# Patient Record
Sex: Female | Born: 1935 | Race: White | Hispanic: No | Smoking: Never smoker
Health system: Southern US, Community
[De-identification: ages and names within clinical notes are randomized; demographics above are authoritative.]

## PROBLEM LIST (undated history)

## (undated) DIAGNOSIS — I350 Nonrheumatic aortic (valve) stenosis: Secondary | ICD-10-CM

## (undated) DIAGNOSIS — C50911 Malignant neoplasm of unspecified site of right female breast: Secondary | ICD-10-CM

## (undated) DIAGNOSIS — Z8719 Personal history of other diseases of the digestive system: Secondary | ICD-10-CM

## (undated) DIAGNOSIS — R0989 Other specified symptoms and signs involving the circulatory and respiratory systems: Secondary | ICD-10-CM

## (undated) DIAGNOSIS — E7800 Pure hypercholesterolemia, unspecified: Secondary | ICD-10-CM

## (undated) DIAGNOSIS — R011 Cardiac murmur, unspecified: Secondary | ICD-10-CM

## (undated) DIAGNOSIS — E119 Type 2 diabetes mellitus without complications: Secondary | ICD-10-CM

## (undated) DIAGNOSIS — E039 Hypothyroidism, unspecified: Secondary | ICD-10-CM

## (undated) DIAGNOSIS — D649 Anemia, unspecified: Secondary | ICD-10-CM

## (undated) DIAGNOSIS — I639 Cerebral infarction, unspecified: Secondary | ICD-10-CM

## (undated) DIAGNOSIS — I679 Cerebrovascular disease, unspecified: Secondary | ICD-10-CM

## (undated) DIAGNOSIS — M1990 Unspecified osteoarthritis, unspecified site: Secondary | ICD-10-CM

## (undated) HISTORY — PX: TONSILLECTOMY: SUR1361

## (undated) HISTORY — PX: CHOLECYSTECTOMY: SHX55

## (undated) HISTORY — DX: Unspecified osteoarthritis, unspecified site: M19.90

## (undated) HISTORY — PX: DILATION AND CURETTAGE OF UTERUS: SHX78

## (undated) HISTORY — DX: Hypothyroidism, unspecified: E03.9

## (undated) HISTORY — PX: HEMIARTHROPLASTY HIP: SUR652

## (undated) HISTORY — PX: APPENDECTOMY: SHX54

## (undated) HISTORY — PX: FRACTURE SURGERY: SHX138

## (undated) HISTORY — PX: PELVIC AND PARA-AORTIC LYMPH NODE DISSECTION: SHX2190

## (undated) HISTORY — PX: TOTAL ABDOMINAL HYSTERECTOMY: SHX209

## (undated) HISTORY — PX: CATARACT EXTRACTION W/ INTRAOCULAR LENS  IMPLANT, BILATERAL: SHX1307

---

## 1998-06-07 ENCOUNTER — Other Ambulatory Visit: Admission: RE | Admit: 1998-06-07 | Discharge: 1998-06-07 | Payer: Self-pay | Admitting: Internal Medicine

## 1998-12-28 ENCOUNTER — Encounter: Payer: Self-pay | Admitting: Internal Medicine

## 1998-12-28 ENCOUNTER — Ambulatory Visit (HOSPITAL_COMMUNITY): Admission: RE | Admit: 1998-12-28 | Discharge: 1998-12-28 | Payer: Self-pay | Admitting: Internal Medicine

## 1999-10-23 ENCOUNTER — Encounter: Payer: Self-pay | Admitting: Emergency Medicine

## 1999-10-23 ENCOUNTER — Emergency Department (HOSPITAL_COMMUNITY): Admission: EM | Admit: 1999-10-23 | Discharge: 1999-10-23 | Payer: Self-pay | Admitting: Emergency Medicine

## 2000-06-26 ENCOUNTER — Encounter: Admission: RE | Admit: 2000-06-26 | Discharge: 2000-06-26 | Payer: Self-pay | Admitting: Internal Medicine

## 2000-06-26 ENCOUNTER — Encounter: Payer: Self-pay | Admitting: Internal Medicine

## 2000-07-25 ENCOUNTER — Encounter (INDEPENDENT_AMBULATORY_CARE_PROVIDER_SITE_OTHER): Payer: Self-pay

## 2000-07-25 ENCOUNTER — Encounter: Payer: Self-pay | Admitting: Internal Medicine

## 2000-07-25 ENCOUNTER — Ambulatory Visit (HOSPITAL_COMMUNITY): Admission: RE | Admit: 2000-07-25 | Discharge: 2000-07-25 | Payer: Self-pay | Admitting: Internal Medicine

## 2000-09-04 ENCOUNTER — Encounter: Payer: Self-pay | Admitting: Internal Medicine

## 2000-09-04 ENCOUNTER — Encounter: Admission: RE | Admit: 2000-09-04 | Discharge: 2000-09-04 | Payer: Self-pay | Admitting: Internal Medicine

## 2001-03-04 ENCOUNTER — Encounter: Admission: RE | Admit: 2001-03-04 | Discharge: 2001-03-04 | Payer: Self-pay | Admitting: Internal Medicine

## 2001-03-04 ENCOUNTER — Encounter: Payer: Self-pay | Admitting: Internal Medicine

## 2001-09-09 ENCOUNTER — Encounter: Payer: Self-pay | Admitting: Internal Medicine

## 2001-09-09 ENCOUNTER — Encounter: Admission: RE | Admit: 2001-09-09 | Discharge: 2001-09-09 | Payer: Self-pay | Admitting: Internal Medicine

## 2003-07-31 ENCOUNTER — Emergency Department (HOSPITAL_COMMUNITY): Admission: EM | Admit: 2003-07-31 | Discharge: 2003-07-31 | Payer: Self-pay | Admitting: Emergency Medicine

## 2003-10-19 ENCOUNTER — Inpatient Hospital Stay (HOSPITAL_COMMUNITY): Admission: EM | Admit: 2003-10-19 | Discharge: 2003-10-22 | Payer: Self-pay | Admitting: Neurology

## 2003-10-19 ENCOUNTER — Emergency Department (HOSPITAL_COMMUNITY): Admission: EM | Admit: 2003-10-19 | Discharge: 2003-10-19 | Payer: Self-pay | Admitting: Dentistry

## 2003-10-20 ENCOUNTER — Encounter: Payer: Self-pay | Admitting: Cardiology

## 2003-11-15 ENCOUNTER — Ambulatory Visit (HOSPITAL_COMMUNITY): Admission: RE | Admit: 2003-11-15 | Discharge: 2003-11-15 | Payer: Self-pay | Admitting: Cardiology

## 2004-04-06 ENCOUNTER — Emergency Department (HOSPITAL_COMMUNITY): Admission: EM | Admit: 2004-04-06 | Discharge: 2004-04-07 | Payer: Self-pay | Admitting: Emergency Medicine

## 2006-08-19 ENCOUNTER — Encounter: Admission: RE | Admit: 2006-08-19 | Discharge: 2006-08-19 | Payer: Self-pay | Admitting: Internal Medicine

## 2007-06-12 ENCOUNTER — Encounter: Admission: RE | Admit: 2007-06-12 | Discharge: 2007-06-12 | Payer: Self-pay | Admitting: Internal Medicine

## 2007-09-02 ENCOUNTER — Encounter: Admission: RE | Admit: 2007-09-02 | Discharge: 2007-09-02 | Payer: Self-pay | Admitting: Internal Medicine

## 2007-10-16 ENCOUNTER — Emergency Department (HOSPITAL_COMMUNITY): Admission: EM | Admit: 2007-10-16 | Discharge: 2007-10-16 | Payer: Self-pay | Admitting: Emergency Medicine

## 2007-10-22 ENCOUNTER — Encounter: Admission: RE | Admit: 2007-10-22 | Discharge: 2007-10-22 | Payer: Self-pay | Admitting: Internal Medicine

## 2007-10-22 ENCOUNTER — Other Ambulatory Visit: Admission: RE | Admit: 2007-10-22 | Discharge: 2007-10-22 | Payer: Self-pay | Admitting: Diagnostic Radiology

## 2007-10-22 ENCOUNTER — Encounter (INDEPENDENT_AMBULATORY_CARE_PROVIDER_SITE_OTHER): Payer: Self-pay | Admitting: Diagnostic Radiology

## 2007-11-13 HISTORY — PX: BREAST LUMPECTOMY: SHX2

## 2007-11-13 HISTORY — PX: BREAST BIOPSY: SHX20

## 2007-12-24 ENCOUNTER — Encounter: Admission: RE | Admit: 2007-12-24 | Discharge: 2007-12-24 | Payer: Self-pay | Admitting: Internal Medicine

## 2008-01-05 ENCOUNTER — Encounter: Admission: RE | Admit: 2008-01-05 | Discharge: 2008-01-05 | Payer: Self-pay | Admitting: Internal Medicine

## 2008-01-05 ENCOUNTER — Encounter (INDEPENDENT_AMBULATORY_CARE_PROVIDER_SITE_OTHER): Payer: Self-pay | Admitting: Diagnostic Radiology

## 2008-01-20 ENCOUNTER — Ambulatory Visit: Payer: Self-pay | Admitting: Oncology

## 2008-01-28 HISTORY — PX: CARDIOVASCULAR STRESS TEST: SHX262

## 2008-02-02 ENCOUNTER — Ambulatory Visit: Admission: RE | Admit: 2008-02-02 | Discharge: 2008-05-02 | Payer: Self-pay | Admitting: Radiation Oncology

## 2008-03-05 ENCOUNTER — Encounter (INDEPENDENT_AMBULATORY_CARE_PROVIDER_SITE_OTHER): Payer: Self-pay | Admitting: Surgery

## 2008-03-05 ENCOUNTER — Ambulatory Visit (HOSPITAL_COMMUNITY): Admission: RE | Admit: 2008-03-05 | Discharge: 2008-03-05 | Payer: Self-pay | Admitting: Surgery

## 2008-03-05 ENCOUNTER — Encounter: Admission: RE | Admit: 2008-03-05 | Discharge: 2008-03-05 | Payer: Self-pay | Admitting: Surgery

## 2008-03-18 ENCOUNTER — Ambulatory Visit: Payer: Self-pay | Admitting: Oncology

## 2008-04-08 ENCOUNTER — Ambulatory Visit: Payer: Self-pay | Admitting: Vascular Surgery

## 2008-04-08 ENCOUNTER — Ambulatory Visit: Admission: RE | Admit: 2008-04-08 | Discharge: 2008-04-08 | Payer: Self-pay | Admitting: Oncology

## 2008-04-08 ENCOUNTER — Encounter: Payer: Self-pay | Admitting: Oncology

## 2008-05-03 ENCOUNTER — Ambulatory Visit: Admission: RE | Admit: 2008-05-03 | Discharge: 2008-07-04 | Payer: Self-pay | Admitting: Radiation Oncology

## 2008-06-10 ENCOUNTER — Ambulatory Visit: Payer: Self-pay | Admitting: Oncology

## 2009-01-17 ENCOUNTER — Encounter: Admission: RE | Admit: 2009-01-17 | Discharge: 2009-01-17 | Payer: Self-pay | Admitting: Surgery

## 2009-03-30 HISTORY — PX: US ECHOCARDIOGRAPHY: HXRAD669

## 2009-04-04 ENCOUNTER — Encounter: Admission: RE | Admit: 2009-04-04 | Discharge: 2009-04-04 | Payer: Self-pay | Admitting: Cardiology

## 2009-07-12 ENCOUNTER — Inpatient Hospital Stay (HOSPITAL_COMMUNITY): Admission: EM | Admit: 2009-07-12 | Discharge: 2009-07-16 | Payer: Self-pay | Admitting: Emergency Medicine

## 2010-01-31 ENCOUNTER — Encounter: Admission: RE | Admit: 2010-01-31 | Discharge: 2010-01-31 | Payer: Self-pay | Admitting: *Deleted

## 2010-02-13 ENCOUNTER — Encounter: Admission: RE | Admit: 2010-02-13 | Discharge: 2010-02-13 | Payer: Self-pay | Admitting: Internal Medicine

## 2010-09-29 ENCOUNTER — Emergency Department (HOSPITAL_COMMUNITY): Admission: EM | Admit: 2010-09-29 | Discharge: 2010-09-29 | Payer: Self-pay | Admitting: Emergency Medicine

## 2010-10-02 ENCOUNTER — Ambulatory Visit: Payer: Self-pay | Admitting: Surgery

## 2010-10-04 ENCOUNTER — Inpatient Hospital Stay (HOSPITAL_COMMUNITY)
Admission: EM | Admit: 2010-10-04 | Discharge: 2010-10-12 | Payer: Self-pay | Source: Home / Self Care | Admitting: Emergency Medicine

## 2010-10-04 ENCOUNTER — Encounter (INDEPENDENT_AMBULATORY_CARE_PROVIDER_SITE_OTHER): Payer: Self-pay | Admitting: Emergency Medicine

## 2010-10-07 ENCOUNTER — Encounter (INDEPENDENT_AMBULATORY_CARE_PROVIDER_SITE_OTHER): Payer: Self-pay | Admitting: Internal Medicine

## 2010-10-24 ENCOUNTER — Ambulatory Visit
Admission: RE | Admit: 2010-10-24 | Discharge: 2010-11-20 | Payer: Self-pay | Source: Home / Self Care | Attending: Radiation Oncology | Admitting: Radiation Oncology

## 2010-10-27 ENCOUNTER — Ambulatory Visit: Payer: Self-pay | Admitting: Cardiology

## 2010-11-03 ENCOUNTER — Ambulatory Visit: Payer: Self-pay | Admitting: Oncology

## 2010-11-08 LAB — CBC WITH DIFFERENTIAL/PLATELET
Basophils Absolute: 0.2 10*3/uL — ABNORMAL HIGH (ref 0.0–0.1)
HCT: 36.7 % (ref 34.8–46.6)
HGB: 12.3 g/dL (ref 11.6–15.9)
LYMPH%: 21.6 % (ref 14.0–49.7)
MCH: 29.5 pg (ref 25.1–34.0)
MONO%: 3.8 % (ref 0.0–14.0)
NEUT#: 10.6 10*3/uL — ABNORMAL HIGH (ref 1.5–6.5)
NEUT%: 72.2 % (ref 38.4–76.8)
Platelets: 328 10*3/uL (ref 145–400)
RDW: 14.4 % (ref 11.2–14.5)
lymph#: 3.2 10*3/uL (ref 0.9–3.3)

## 2010-11-09 LAB — COMPREHENSIVE METABOLIC PANEL
AST: 12 U/L (ref 0–37)
Albumin: 3.6 g/dL (ref 3.5–5.2)
Alkaline Phosphatase: 116 U/L (ref 39–117)
BUN: 14 mg/dL (ref 6–23)
CO2: 20 mEq/L (ref 19–32)
Creatinine, Ser: 0.75 mg/dL (ref 0.40–1.20)
Glucose, Bld: 167 mg/dL — ABNORMAL HIGH (ref 70–99)
Potassium: 3.7 mEq/L (ref 3.5–5.3)
Sodium: 140 mEq/L (ref 135–145)
Total Bilirubin: 0.2 mg/dL — ABNORMAL LOW (ref 0.3–1.2)
Total Protein: 6.3 g/dL (ref 6.0–8.3)

## 2010-11-09 LAB — CANCER ANTIGEN 27.29: CA 27.29: 65 U/mL — ABNORMAL HIGH (ref 0–39)

## 2010-12-02 ENCOUNTER — Encounter: Payer: Self-pay | Admitting: Internal Medicine

## 2010-12-03 ENCOUNTER — Encounter: Payer: Self-pay | Admitting: Internal Medicine

## 2010-12-21 ENCOUNTER — Encounter (HOSPITAL_BASED_OUTPATIENT_CLINIC_OR_DEPARTMENT_OTHER): Payer: MEDICARE | Admitting: Oncology

## 2010-12-21 ENCOUNTER — Ambulatory Visit: Payer: MEDICARE | Attending: Radiation Oncology | Admitting: Radiation Oncology

## 2010-12-21 DIAGNOSIS — C50419 Malignant neoplasm of upper-outer quadrant of unspecified female breast: Secondary | ICD-10-CM

## 2010-12-21 DIAGNOSIS — C7952 Secondary malignant neoplasm of bone marrow: Secondary | ICD-10-CM

## 2010-12-21 DIAGNOSIS — Z170 Estrogen receptor positive status [ER+]: Secondary | ICD-10-CM

## 2010-12-21 DIAGNOSIS — C7951 Secondary malignant neoplasm of bone: Secondary | ICD-10-CM

## 2011-01-19 ENCOUNTER — Other Ambulatory Visit: Payer: Self-pay | Admitting: Internal Medicine

## 2011-01-19 DIAGNOSIS — Z1231 Encounter for screening mammogram for malignant neoplasm of breast: Secondary | ICD-10-CM

## 2011-01-19 DIAGNOSIS — Z9889 Other specified postprocedural states: Secondary | ICD-10-CM

## 2011-01-23 LAB — COMPREHENSIVE METABOLIC PANEL
AST: 18 U/L (ref 0–37)
Albumin: 2.2 g/dL — ABNORMAL LOW (ref 3.5–5.2)
BUN: 9 mg/dL (ref 6–23)
Calcium: 8.4 mg/dL (ref 8.4–10.5)
Creatinine, Ser: 0.67 mg/dL (ref 0.4–1.2)
GFR calc Af Amer: >60 mL/min (ref >60)
GFR calc non Af Amer: >60 mL/min (ref >60)
Potassium: 3.7 mEq/L (ref 3.5–5.1)
Total Protein: 5.4 g/dL — ABNORMAL LOW (ref 6.0–8.3)

## 2011-01-23 LAB — GLUCOSE, CAPILLARY
Glucose-Capillary: 124 mg/dL — ABNORMAL HIGH (ref 70–99)
Glucose-Capillary: 72 mg/dL (ref 70–99)

## 2011-01-23 LAB — CBC
HCT: 29.6 % — ABNORMAL LOW (ref 36.0–46.0)
Hemoglobin: 9.8 g/dL — ABNORMAL LOW (ref 12.0–15.0)
MCV: 87.1 fL (ref 78.0–100.0)

## 2011-01-23 LAB — LACTATE DEHYDROGENASE: LDH: 181 U/L (ref 94–250)

## 2011-01-23 LAB — CEA: CEA: 6.8 ng/mL — ABNORMAL HIGH (ref 0.0–5.0)

## 2011-01-24 LAB — GLUCOSE, CAPILLARY
Glucose-Capillary: 102 mg/dL — ABNORMAL HIGH (ref 70–99)
Glucose-Capillary: 118 mg/dL — ABNORMAL HIGH (ref 70–99)
Glucose-Capillary: 123 mg/dL — ABNORMAL HIGH (ref 70–99)
Glucose-Capillary: 132 mg/dL — ABNORMAL HIGH (ref 70–99)
Glucose-Capillary: 145 mg/dL — ABNORMAL HIGH (ref 70–99)
Glucose-Capillary: 145 mg/dL — ABNORMAL HIGH (ref 70–99)
Glucose-Capillary: 156 mg/dL — ABNORMAL HIGH (ref 70–99)
Glucose-Capillary: 158 mg/dL — ABNORMAL HIGH (ref 70–99)
Glucose-Capillary: 162 mg/dL — ABNORMAL HIGH (ref 70–99)
Glucose-Capillary: 162 mg/dL — ABNORMAL HIGH (ref 70–99)
Glucose-Capillary: 163 mg/dL — ABNORMAL HIGH (ref 70–99)
Glucose-Capillary: 165 mg/dL — ABNORMAL HIGH (ref 70–99)
Glucose-Capillary: 183 mg/dL — ABNORMAL HIGH (ref 70–99)
Glucose-Capillary: 185 mg/dL — ABNORMAL HIGH (ref 70–99)
Glucose-Capillary: 188 mg/dL — ABNORMAL HIGH (ref 70–99)
Glucose-Capillary: 194 mg/dL — ABNORMAL HIGH (ref 70–99)
Glucose-Capillary: 201 mg/dL — ABNORMAL HIGH (ref 70–99)

## 2011-01-24 LAB — CBC
HCT: 37.1 % (ref 36.0–46.0)
HCT: 38.3 % (ref 36.0–46.0)
Hemoglobin: 10.9 g/dL — ABNORMAL LOW (ref 12.0–15.0)
Hemoglobin: 12.6 g/dL (ref 12.0–15.0)
Hemoglobin: 14.3 g/dL (ref 12.0–15.0)
MCH: 29.5 pg (ref 26.0–34.0)
MCH: 29.5 pg (ref 26.0–34.0)
MCH: 29.6 pg (ref 26.0–34.0)
MCH: 30 pg (ref 26.0–34.0)
MCHC: 33.3 g/dL (ref 30.0–36.0)
MCHC: 34 g/dL (ref 30.0–36.0)
MCHC: 34.1 g/dL (ref 30.0–36.0)
MCV: 88.2 fL (ref 78.0–100.0)
Platelets: 248 10*3/uL (ref 150–400)
Platelets: 290 10*3/uL (ref 150–400)
Platelets: 349 10*3/uL (ref 150–400)
Platelets: 362 10*3/uL (ref 150–400)
RBC: 3.67 MIL/uL — ABNORMAL LOW (ref 3.87–5.11)
RBC: 4.12 MIL/uL (ref 3.87–5.11)
RBC: 4.24 MIL/uL (ref 3.87–5.11)
RBC: 4.84 MIL/uL (ref 3.87–5.11)
RDW: 14.4 % (ref 11.5–15.5)
RDW: 14.6 % (ref 11.5–15.5)
WBC: 17 10*3/uL — ABNORMAL HIGH (ref 4.0–10.5)
WBC: 19.3 10*3/uL — ABNORMAL HIGH (ref 4.0–10.5)
WBC: 21.2 10*3/uL — ABNORMAL HIGH (ref 4.0–10.5)

## 2011-01-24 LAB — BASIC METABOLIC PANEL
BUN: 13 mg/dL (ref 6–23)
BUN: 15 mg/dL (ref 6–23)
BUN: 9 mg/dL (ref 6–23)
CO2: 23 mEq/L (ref 19–32)
CO2: 24 mEq/L (ref 19–32)
Calcium: 8.5 mg/dL (ref 8.4–10.5)
Calcium: 8.7 mg/dL (ref 8.4–10.5)
Calcium: 9.1 mg/dL (ref 8.4–10.5)
Creatinine, Ser: 0.66 mg/dL (ref 0.4–1.2)
Creatinine, Ser: 0.7 mg/dL (ref 0.4–1.2)
GFR calc Af Amer: >60 mL/min (ref >60)
GFR calc Af Amer: >60 mL/min (ref >60)
GFR calc non Af Amer: >60 mL/min (ref >60)
GFR calc non Af Amer: >60 mL/min (ref >60)
GFR calc non Af Amer: >60 mL/min (ref >60)
Glucose, Bld: 149 mg/dL — ABNORMAL HIGH (ref 70–99)
Glucose, Bld: 155 mg/dL — ABNORMAL HIGH (ref 70–99)
Potassium: 3.8 mEq/L (ref 3.5–5.1)
Sodium: 136 mEq/L (ref 135–145)
Sodium: 137 mEq/L (ref 135–145)

## 2011-01-24 LAB — DIFFERENTIAL
Basophils Absolute: 0.1 10*3/uL (ref 0.0–0.1)
Basophils Absolute: 0.4 10*3/uL — ABNORMAL HIGH (ref 0.0–0.1)
Basophils Relative: 1 % (ref 0–1)
Basophils Relative: 2 % — ABNORMAL HIGH (ref 0–1)
Eosinophils Absolute: 0.1 10*3/uL (ref 0.0–0.7)
Eosinophils Relative: 0 % (ref 0–5)
Lymphocytes Relative: 18 % (ref 12–46)
Lymphocytes Relative: 24 % (ref 12–46)
Monocytes Absolute: 0.7 10*3/uL (ref 0.1–1.0)
Monocytes Absolute: 0.9 10*3/uL (ref 0.1–1.0)
Neutro Abs: 13.4 10*3/uL — ABNORMAL HIGH (ref 1.7–7.7)
Neutrophils Relative %: 70 % (ref 43–77)

## 2011-01-24 LAB — COMPREHENSIVE METABOLIC PANEL
AST: 17 U/L (ref 0–37)
AST: 22 U/L (ref 0–37)
Albumin: 2.8 g/dL — ABNORMAL LOW (ref 3.5–5.2)
Albumin: 3.4 g/dL — ABNORMAL LOW (ref 3.5–5.2)
Alkaline Phosphatase: 131 U/L — ABNORMAL HIGH (ref 39–117)
BUN: 13 mg/dL (ref 6–23)
CO2: 23 mEq/L (ref 19–32)
Calcium: 9.1 mg/dL (ref 8.4–10.5)
Chloride: 102 mEq/L (ref 96–112)
Chloride: 102 mEq/L (ref 96–112)
Creatinine, Ser: 0.83 mg/dL (ref 0.4–1.2)
Creatinine, Ser: 0.87 mg/dL (ref 0.4–1.2)
GFR calc Af Amer: >60 mL/min (ref >60)
GFR calc Af Amer: >60 mL/min (ref >60)
GFR calc non Af Amer: >60 mL/min (ref >60)
Potassium: 3.9 mEq/L (ref 3.5–5.1)
Sodium: 138 mEq/L (ref 135–145)
Total Bilirubin: 0.5 mg/dL (ref 0.3–1.2)

## 2011-01-24 LAB — URINALYSIS, ROUTINE W REFLEX MICROSCOPIC
Glucose, UA: NEGATIVE mg/dL
Hgb urine dipstick: NEGATIVE
Ketones, ur: NEGATIVE mg/dL
Ketones, ur: NEGATIVE mg/dL
Protein, ur: NEGATIVE mg/dL
Protein, ur: NEGATIVE mg/dL
Urobilinogen, UA: 0.2 mg/dL (ref 0.0–1.0)
pH: 6 (ref 5.0–8.0)

## 2011-01-24 LAB — URINE CULTURE
Culture  Setup Time: 201111290127
Special Requests: NEGATIVE

## 2011-01-24 LAB — URINE MICROSCOPIC-ADD ON

## 2011-01-24 LAB — PROTIME-INR: INR: 1.07 (ref 0.00–1.49)

## 2011-01-24 LAB — HEMOGLOBIN A1C: Hgb A1c MFr Bld: 7.2 % — ABNORMAL HIGH (ref <5.7)

## 2011-02-05 ENCOUNTER — Ambulatory Visit
Admission: RE | Admit: 2011-02-05 | Discharge: 2011-02-05 | Disposition: A | Discharge status: Home / Self Care | Payer: MEDICARE | Source: Ambulatory Visit | Attending: Internal Medicine | Admitting: Internal Medicine

## 2011-02-05 DIAGNOSIS — Z9889 Other specified postprocedural states: Secondary | ICD-10-CM

## 2011-02-09 ENCOUNTER — Other Ambulatory Visit: Payer: Self-pay | Admitting: Oncology

## 2011-02-09 ENCOUNTER — Encounter (HOSPITAL_BASED_OUTPATIENT_CLINIC_OR_DEPARTMENT_OTHER): Payer: MEDICARE | Admitting: Oncology

## 2011-02-09 ENCOUNTER — Encounter: Payer: MEDICARE | Admitting: Oncology

## 2011-02-09 DIAGNOSIS — C50419 Malignant neoplasm of upper-outer quadrant of unspecified female breast: Secondary | ICD-10-CM

## 2011-02-09 DIAGNOSIS — C7951 Secondary malignant neoplasm of bone: Secondary | ICD-10-CM

## 2011-02-09 DIAGNOSIS — Z170 Estrogen receptor positive status [ER+]: Secondary | ICD-10-CM

## 2011-02-09 LAB — CBC WITH DIFFERENTIAL/PLATELET
Basophils Absolute: 0.2 10*3/uL — ABNORMAL HIGH (ref 0.0–0.1)
Eosinophils Absolute: 0.2 10*3/uL (ref 0.0–0.5)
HCT: 35.2 % (ref 34.8–46.6)
HGB: 11.8 g/dL (ref 11.6–15.9)
LYMPH%: 22.1 % (ref 14.0–49.7)
MCV: 83.2 fL (ref 79.5–101.0)
MONO#: 0.6 10*3/uL (ref 0.1–0.9)
MONO%: 4.4 % (ref 0.0–14.0)
NEUT#: 9.8 10*3/uL — ABNORMAL HIGH (ref 1.5–6.5)
NEUT%: 71 % (ref 38.4–76.8)
Platelets: 263 10*3/uL (ref 145–400)
WBC: 13.8 10*3/uL — ABNORMAL HIGH (ref 3.9–10.3)

## 2011-02-09 LAB — COMPREHENSIVE METABOLIC PANEL
Alkaline Phosphatase: 92 U/L (ref 39–117)
BUN: 13 mg/dL (ref 6–23)
CO2: 24 mEq/L (ref 19–32)
Glucose, Bld: 138 mg/dL — ABNORMAL HIGH (ref 70–99)
Sodium: 138 mEq/L (ref 135–145)
Total Bilirubin: 0.5 mg/dL (ref 0.3–1.2)
Total Protein: 7 g/dL (ref 6.0–8.3)

## 2011-02-09 LAB — CANCER ANTIGEN 27.29: CA 27.29: 23 U/mL (ref 0–39)

## 2011-02-16 LAB — GLUCOSE, CAPILLARY
Glucose-Capillary: 111 mg/dL — ABNORMAL HIGH (ref 70–99)
Glucose-Capillary: 122 mg/dL — ABNORMAL HIGH (ref 70–99)
Glucose-Capillary: 125 mg/dL — ABNORMAL HIGH (ref 70–99)
Glucose-Capillary: 128 mg/dL — ABNORMAL HIGH (ref 70–99)
Glucose-Capillary: 128 mg/dL — ABNORMAL HIGH (ref 70–99)
Glucose-Capillary: 132 mg/dL — ABNORMAL HIGH (ref 70–99)
Glucose-Capillary: 134 mg/dL — ABNORMAL HIGH (ref 70–99)
Glucose-Capillary: 134 mg/dL — ABNORMAL HIGH (ref 70–99)
Glucose-Capillary: 139 mg/dL — ABNORMAL HIGH (ref 70–99)
Glucose-Capillary: 140 mg/dL — ABNORMAL HIGH (ref 70–99)
Glucose-Capillary: 140 mg/dL — ABNORMAL HIGH (ref 70–99)
Glucose-Capillary: 156 mg/dL — ABNORMAL HIGH (ref 70–99)
Glucose-Capillary: 166 mg/dL — ABNORMAL HIGH (ref 70–99)

## 2011-02-16 LAB — CBC
HCT: 31.4 % — ABNORMAL LOW (ref 36.0–46.0)
Hemoglobin: 10.8 g/dL — ABNORMAL LOW (ref 12.0–15.0)
Hemoglobin: 11.3 g/dL — ABNORMAL LOW (ref 12.0–15.0)
MCHC: 34.1 g/dL (ref 30.0–36.0)
MCHC: 35.3 g/dL (ref 30.0–36.0)
MCHC: 35.3 g/dL (ref 30.0–36.0)
MCV: 98.4 fL (ref 78.0–100.0)
MCV: 98.7 fL (ref 78.0–100.0)
Platelets: 234 10*3/uL (ref 150–400)
Platelets: 235 10*3/uL (ref 150–400)
RBC: 2.29 MIL/uL — ABNORMAL LOW (ref 3.87–5.11)
RBC: 3.15 MIL/uL — ABNORMAL LOW (ref 3.87–5.11)
RBC: 3.48 MIL/uL — ABNORMAL LOW (ref 3.87–5.11)
RDW: 13.7 % (ref 11.5–15.5)
WBC: 14.5 10*3/uL — ABNORMAL HIGH (ref 4.0–10.5)
WBC: 18.6 10*3/uL — ABNORMAL HIGH (ref 4.0–10.5)

## 2011-02-16 LAB — RENAL FUNCTION PANEL
BUN: 11 mg/dL (ref 6–23)
Calcium: 8.2 mg/dL — ABNORMAL LOW (ref 8.4–10.5)
Glucose, Bld: 147 mg/dL — ABNORMAL HIGH (ref 70–99)
Phosphorus: 2.4 mg/dL (ref 2.3–4.6)

## 2011-02-16 LAB — BASIC METABOLIC PANEL
BUN: 10 mg/dL (ref 6–23)
BUN: 21 mg/dL (ref 6–23)
Calcium: 7.8 mg/dL — ABNORMAL LOW (ref 8.4–10.5)
Calcium: 8.1 mg/dL — ABNORMAL LOW (ref 8.4–10.5)
Calcium: 8.6 mg/dL (ref 8.4–10.5)
GFR calc Af Amer: >60 mL/min (ref >60)
GFR calc non Af Amer: >60 mL/min (ref >60)
GFR calc non Af Amer: >60 mL/min (ref >60)
GFR calc non Af Amer: >60 mL/min (ref >60)
Glucose, Bld: 126 mg/dL — ABNORMAL HIGH (ref 70–99)
Glucose, Bld: 149 mg/dL — ABNORMAL HIGH (ref 70–99)
Potassium: 3.6 mEq/L (ref 3.5–5.1)
Potassium: 3.8 mEq/L (ref 3.5–5.1)
Sodium: 134 mEq/L — ABNORMAL LOW (ref 135–145)
Sodium: 135 mEq/L (ref 135–145)

## 2011-02-16 LAB — HEMOGLOBIN A1C: Hgb A1c MFr Bld: 7 % — ABNORMAL HIGH (ref 4.6–6.1)

## 2011-02-17 LAB — GLUCOSE, CAPILLARY: Glucose-Capillary: 158 mg/dL — ABNORMAL HIGH (ref 70–99)

## 2011-02-17 LAB — COMPREHENSIVE METABOLIC PANEL
Albumin: 2.7 g/dL — ABNORMAL LOW (ref 3.5–5.2)
BUN: 18 mg/dL (ref 6–23)
Calcium: 8.4 mg/dL (ref 8.4–10.5)
Creatinine, Ser: 0.8 mg/dL (ref 0.4–1.2)
GFR calc Af Amer: >60 mL/min (ref >60)
Total Bilirubin: 0.7 mg/dL (ref 0.3–1.2)
Total Protein: 6.2 g/dL (ref 6.0–8.3)

## 2011-02-17 LAB — CBC
Hemoglobin: 9.6 g/dL — ABNORMAL LOW (ref 12.0–15.0)
MCHC: 34.1 g/dL (ref 30.0–36.0)
MCHC: 34.2 g/dL (ref 30.0–36.0)
MCV: 92 fL (ref 78.0–100.0)
Platelets: 244 10*3/uL (ref 150–400)
RBC: 3 MIL/uL — ABNORMAL LOW (ref 3.87–5.11)
WBC: 16.3 10*3/uL — ABNORMAL HIGH (ref 4.0–10.5)
WBC: 17.5 10*3/uL — ABNORMAL HIGH (ref 4.0–10.5)

## 2011-02-17 LAB — CROSSMATCH: Antibody Screen: NEGATIVE

## 2011-02-17 LAB — URINALYSIS, ROUTINE W REFLEX MICROSCOPIC
Bilirubin Urine: NEGATIVE
Nitrite: NEGATIVE
Specific Gravity, Urine: 1.016 (ref 1.005–1.030)
pH: 6.5 (ref 5.0–8.0)

## 2011-02-17 LAB — DIFFERENTIAL
Basophils Relative: 1 % (ref 0–1)
Eosinophils Absolute: 1.6 10*3/uL — ABNORMAL HIGH (ref 0.0–0.7)
Eosinophils Relative: 10 % — ABNORMAL HIGH (ref 0–5)
Lymphocytes Relative: 11 % — ABNORMAL LOW (ref 12–46)
Monocytes Relative: 4 % (ref 3–12)
Neutrophils Relative %: 74 % (ref 43–77)

## 2011-02-17 LAB — LIPID PANEL
Cholesterol: 135 mg/dL (ref 0–200)
Triglycerides: 96 mg/dL (ref <150)

## 2011-02-17 LAB — PROTIME-INR
INR: 1.1 (ref 0.00–1.49)
Prothrombin Time: 13.9 seconds (ref 11.6–15.2)

## 2011-02-17 LAB — HEMOCCULT GUIAC POC 1CARD (OFFICE): Fecal Occult Bld: POSITIVE

## 2011-02-17 LAB — ABO/RH: ABO/RH(D): A POS

## 2011-02-17 LAB — APTT: aPTT: 34 seconds (ref 24–37)

## 2011-02-17 LAB — URINE CULTURE

## 2011-02-17 LAB — SAMPLE TO BLOOD BANK

## 2011-02-17 LAB — TSH: TSH: 4.097 u[IU]/mL (ref 0.350–4.500)

## 2011-03-02 ENCOUNTER — Encounter (HOSPITAL_BASED_OUTPATIENT_CLINIC_OR_DEPARTMENT_OTHER): Payer: MEDICARE | Admitting: Oncology

## 2011-03-02 DIAGNOSIS — C779 Secondary and unspecified malignant neoplasm of lymph node, unspecified: Secondary | ICD-10-CM

## 2011-03-02 DIAGNOSIS — C50419 Malignant neoplasm of upper-outer quadrant of unspecified female breast: Secondary | ICD-10-CM

## 2011-03-02 DIAGNOSIS — C7951 Secondary malignant neoplasm of bone: Secondary | ICD-10-CM

## 2011-03-12 ENCOUNTER — Other Ambulatory Visit: Payer: Self-pay | Admitting: Cardiology

## 2011-03-12 DIAGNOSIS — E876 Hypokalemia: Secondary | ICD-10-CM

## 2011-03-12 NOTE — Telephone Encounter (Signed)
escribe request  

## 2011-03-27 NOTE — Op Note (Signed)
Molly [name], Molly [name]             ACCOUNT NO.:  [id number]   MEDICAL RECORD NO.:  [id number]          PATIENT TYPE:  AMB   LOCATION:  SDS                          FACILITY:  [facility]   PHYSICIAN:  Thomas A. Cornett, M.D.DATE OF BIRTH:  [birth year]   DATE OF PROCEDURE:  03/05/2008  DATE OF DISCHARGE:  03/05/2008                               OPERATIVE REPORT   PREOPERATIVE DIAGNOSIS:  Right breast cancer.   POSTOPERATIVE DIAGNOSIS:  Right breast cancer.   PROCEDURE:  1. Right breast needle-localized lumpectomy.  2. Right sentinel lymph node mapping with injection of methylene blue      dye.   SURGEON:  Maisie Fus A. Cornett, MD.   ANESTHESIA:  General endotracheal anesthesia with 0.25% Sensorcaine  local with epinephrine.   DRAINS:  None.   SPECIMEN:  1. Right breast localizing wire clip verified by radiography to be      adequate.  2. Two right axillary sentinel lymph nodes negative by touch prep.   INDICATIONS FOR PROCEDURE:  The patient is an 77 year old female  diagnosed recently with right breast cancer.  She elected to undergo  lumpectomy, sentinel node  mapping in effort to preserve her breast.  She presents today for that.  Informed consent was obtained.   DESCRIPTION OF PROCEDURE:  The patient was brought to the operating room  after right breast needle localization, an injection of technetium  sulfur colloid by Radiology.  After induction of general anesthesia, the  right breast and right axilla were prepped and draped in sterile  fashion.  A 4 mL of methylene blue dye were injected in a subareolar  position and massaged for 5 minutes.  The sentinel node was done first.  After infiltration in the right axilla with local anesthesia, NeoProbe  was used and the site was marked.  Incision was made in the axilla.  Dissection was carried down into hot but not blue sentinel nodes were  identified and sent to pathology for evaluation.  There were no other  hot areas in the  right axilla.   Next, the right lumpectomy site was done.  An incision was made in the  upper breast.  With this, the wire exited.  We pulled the wire out the  incision, then dissected around the entire circumference of the wire.  The wire tracked very inferiorly from a superior location behind the  nipple but we were able to find the tumor and excised this in its  entirety.  I did take some additional margin tissue with it separately.  The deep margin that was on the pectoralis major and this was about as  deep as I could go there .  It appeared we had the entire tumor well  excised.  I removed this, sent to radiology, found it to be adequate and  this was sent to pathology.  Irrigation was used and suctioned over the  lumpectomy cavity.  I saw no evidence of bleeding and used cautery for  some mild oozing.  We then closed this in layers using a deep layer of 3-  0 Vicryl and a subsequent layer of  4-0 Monocryl subcuticular stitch.  Dermabond was applied.  The right axillary incision was irrigated out  and closed in a similar fashion using 3-0  Vicryl with 4-0 Monocryl subcuticular stitch.  Dermabond was applied  here as well.  All final counts of sponge, needle and instruments were  found to be correct at this portion of the case.  The patient was then  awoke, taken to recovery in satisfactory condition.      Thomas A. Cornett, M.D.  Electronically Signed     TAC/MEDQ  D:  03/05/2008  T:  03/06/2008  Job:  [id number]   cc:   Antony Madura, M.D.

## 2011-03-27 NOTE — Consult Note (Signed)
Molly [name], Molly [name]             ACCOUNT NO.:  [id number]   MEDICAL RECORD NO.:  [id number]          PATIENT TYPE:  INP   LOCATION:  0103                         FACILITY:  [facility]   PHYSICIAN:  John C. Madilyn Fireman, M.D.    DATE OF BIRTH:  Sep 16, 1923   DATE OF CONSULTATION:  07/12/2009  DATE OF DISCHARGE:                                 CONSULTATION   REASON FOR CONSULTATION:  GI bleeding.   HISTORY OF PRESENT ILLNESS:  The patient is an 77 year old white female  who began having black tarry stools 3 days ago with progressive  weakness, no abdominal pain, minimal nausea and no vomiting.  She states  she had been taking sulindac for 3 months for arthritis of the knee and  also takes an 81 mg aspirin a day.  She states she has a hiatal hernia  but no history of peptic ulcer disease.  She has no history of upper or  lower GI endoscopy, although she may have had an upper GI series in the  past.  She was initially normotensive but had 2 readings of a systolic  blood pressure in the 80s, but more recently with fluid challenge, her  systolic blood pressure has remained greater than 115 with a pulse in  the 70s.  She does take atenolol.   PAST MEDICAL HISTORY:  Hypertension, diabetes non insulin dependent,  history of thyroid nodule, history of hiatal hernia.   ALLERGIES:  CODEINE, LIBRIUM, DEMEROL.   MEDICATIONS:  Aspirin 325 mg a day, atenolol unknown dose, Clinoril  unknown dose, glimepiride, metformin, potassium chloride, Synthroid,  vitamin B12.   FAMILY HISTORY:  Negative for GI malignancy.   PHYSICAL EXAMINATION:  GENERAL:  Obese white female in no acute  distress.  HEART:  Regular rate and rhythm without murmur.  LUNGS:  Clear.  ABDOMEN:  Soft, slightly distended with normoactive bowel sounds.  There  is a well-healed cholecystectomy scar in the right upper quadrant, no  hepatosplenomegaly, mass or guarding.  RECTAL EXAM:  Revealed heme-positive dark stool per the emergency room  physician's exam.   LABORATORIES:  Hemoglobin 9.6, hematocrit 27.9, WBC 16,300, platelets  253,000, BUN 18, creatinine 0.8.  Liver function tests normal.   IMPRESSION:  Likely upper gastrointestinal bleed, suspect from  nonsteroidal anti-inflammatory drug-induced ulceration.   PLAN:  Admit.  Hydrate.  Monitor stools and hemoglobin.  Treat with  proton pump inhibitor, and proceed with EGD in the morning.           ______________________________  Everardo All. Madilyn Fireman, M.D.     JCH/MEDQ  D:  07/12/2009  T:  07/12/2009  Job:  [id number]   cc:   Cassell Clement, M.D.  Fax: [phone]   Antony Madura, M.D.  Fax: [phone]

## 2011-03-27 NOTE — H&P (Signed)
Molly [name], KEOUGH NO.:  [id number]   MEDICAL RECORD NO.:  [id number]          PATIENT TYPE:  EMS   LOCATION:  ED                           FACILITY:  [facility]   PHYSICIAN:  Peggye Pitt, M.D. DATE OF BIRTH:  [birth year]   DATE OF ADMISSION:  07/12/2009  DATE OF DISCHARGE:                              HISTORY & PHYSICAL   PRIMARY CARE Mischelle Reeg:  Antony Madura, M.D.   CARDIOLOGIST:  Cassell Clement, M.D.   CHIEF COMPLAINT:  [name], tarry stools.   HISTORY OF PRESENT ILLNESS:  Molly [name] is an 77 year old female with  a history of diabetes, hypertension, hyperlipidemia, presenting to the  emergency room today after a 4-day history of dark, tarry stools.  This  a.m., she had 2 episodes of diarrhea that were very dark.  She became  weak and nauseated.  She denies chest pain, shortness of breath,  palpitations, diaphoresis, numbness, tingling, or dizziness during this  episode.  She states that 7 days ago, she did run a fever of about 99.  It quickly resolved the next day.  While in the emergency room today,  her blood pressure dropped to 81/45.  She received an IV bolus and  responded well.  Her hemoglobin was discovered to be 9.6, and we are  asked to admit her for further evaluation and treatment.   ALLERGIES:  CODEINE, LIBRIUM, DEMEROL, CONTRAST MEDIA, ZOCOR.   PAST MEDICAL HISTORY:  1. Breast cancer.  2. Hypertension.  3. Diabetes.  4. History of a nodule on her thyroid.  5. Hiatal hernia.  6. Questionable aortic aneurysm.  7. TIA.  8. Hyperlipidemia.  9. Degenerative joint disease.  10.Low back pain.   PAST SURGICAL HISTORY:  1. Right breast needle lumpectomy.  2. Remote appendectomy.  3. Remote tonsillectomy.  4. Remote hysterectomy.  5. Cholecystectomy.   FAMILY HISTORY:  Noncontributory to this admission.   SOCIAL HISTORY:  No history of tobacco use.  No history of alcohol  abuse.  She is a widow who currently lives alone  with close assistance  from her daughter.   MEDICATIONS:  1. Synthroid 0.75 p.o. daily.  2. Exforge 10/320 mg p.o. daily.  3. Klor-Con 10 mEq p.o. daily.  4. Zetia 10 mg p.o. daily.  5. Metformin 500 mg p.o. daily.  6. Glimepiride 2 mg p.o. daily.  7. B12 500 mg p.o. daily.  8. ASA 81 mg p.o. daily.  9. Atenolol 25 mg p.o. daily.  10.Sulindac, unknown dosage.  11.Lotrimin 1% cream.  Apply to rectum t.i.d.   REVIEW OF SYSTEMS:  GENERAL:  Positive for weakness, fever, chills.  ENT:  No sore throat.  No nasal congestion.  CV:  No chest pain,  palpitations.  Positive for occasional lower extremity edema.  RESPIRATORY:  Positive for dry cough.  Negative for shortness of breath.  NEURO:  Negative headaches.  Negative visual disturbances.  GI:  Positive for nausea, diarrhea, and melena.  GU:  No hematuria, dysuria.  No frequency or urgency.  MUSCULOSKELETAL:  Positive knee pain,  otherwise no joint pain.  No muscle weakness.  PSYCH:  No depression or  anxiety.  HEME:  See HPI, otherwise no unusual bleeding or bruising.   LABS:  Her WBCs are 16.3, hemoglobin 9.6, hematocrit 27.9, platelets  253.  Sodium 134, potassium 3.8, chloride 105, CO2 23, BUN 18,  creatinine 0.80, glucose 141.  PT 13.9, INR 1.1, PTT 34, AST 28, ALT 17,  albumin 2.7.   CHEST X-RAY:  Stable with slight cardiomegaly.  No active  cardiopulmonary disease.   EKG:  Normal sinus rhythm at 72.   PHYSICAL EXAMINATION:  VITAL SIGNS:  T 97.7, BP 135/65, heart rate 72,  respiratory rate 20, 99% on 2 liters nasal cannula.  GENERAL:  Sitting up in bed, slightly pale.  Obese.  No acute distress.  HEAD:  Normocephalic and atraumatic.  NECK:  Supple.  No JVD.  No lymphadenopathy.  CV:  Regular rate and rhythm.  No murmur.  No lower extremity edema.  RESPIRATORY:  No increased work of breathing.  Breath sounds clear to  auscultation bilaterally.  MUSCULOSKELETAL:  No joint edema or erythema.  Moves all extremities x4.   ABDOMEN:  Obese, soft, nontender, with diminished bowel sounds.  SKIN:  Slightly pink rash on her lumbar spine.   ASSESSMENT/PLAN:  1. Melena with hypotension:  A 4-day history of melena, hemoccult      positive.  Hemoglobin 9.6, blood pressure 135/65, up from 81/45      after IV bolus.  Will admit to step-down.  Will cycle H and H.  Get      GI consult, n.p.o.  Start Protonix IV.  Hold aspirin, ACE.  Will      type and cross and hold for 2 units.  2. Leukocytosis:  Etiology unclear.  WBCs 16.3.  Currently afebrile.      UA negative.  Chest x-ray:  No indication of infection.  Clinical      presentation offers no indication for antibiotics at this time.      Will monitor closely.  3. Diabetes:  Glucose 141.  Will hold oral agents and start sliding-      scale insulin.  CBGs q.a.c. and q.h.s.  Check A1C.  4. Hypertension:  See problem #1.  Admit to step-down.  Hydrate with      IV fluids.  Hold antihypertensives.  5. History of nodule on thyroid.  Synthroid IV secondary to n.p.o.      status.  Will check a TSH.  6. Hyperlipidemia:  Holding Zetia secondary to #1.  Will check FLPs.  7. History of breast cancer.  8. History of transient ischemic attack:  Holding aspirin secondary to      problem #1.  Will monitor closely.  9. No Lovenox prophylaxis secondary to bleeding.   This assessment and plan was discussed with Dr. Ardyth Harps.  Dictated by  Toya Smothers, NP.      Peggye Pitt, M.D.  Electronically Signed     Peggye Pitt, M.D.  Electronically Signed    EH/MEDQ  D:  07/12/2009  T:  07/12/2009  Job:  [id number]   cc:   Cassell Clement, M.D.  Fax: [phone]   Antony Madura, M.D.  Fax: [phone]

## 2011-03-27 NOTE — Op Note (Signed)
Molly [name], Molly [name]             ACCOUNT NO.:  [id number]   MEDICAL RECORD NO.:  [id number]          PATIENT TYPE:  INP   LOCATION:  1223                         [facility]ACILITY:  [facility]   PHYSICIAN:  John C. Madilyn Fireman, M.D.    DATE OF BIRTH:  1923-03-12   DATE OF PROCEDURE:  DATE OF DISCHARGE:                               OPERATIVE REPORT   PROCEDURE:  Esophagogastroduodenoscopy with control of hemorrhage   INDICATIONS FOR PROCEDURE:  Melena with hypotension.   PROCEDURE:  The patient was placed in the left lateral decubitus  position and placed on the pulse monitor with continuous low-flow oxygen  delivered by nasal cannula.  She was sedated with 50 mcg IV fentanyl and  5 mg IV Versed.   The Olympus video endoscope was advanced under direct vision into the  oropharynx and esophagus.  The esophagus was straight and of normal  caliber with squamocolumnar line at 38 cm.  There was no visible hiatal  hernia, ring stricture, or other abnormality of the GE junction.  The  stomach was entered and small amount of maroon blood without clots noted  in the fundus.  This was suctioned away and complete thorough view of  the stomach was accomplished.  There were no lesions seen anywhere, no  active bleeding sources.  The pylorus was traversed and the duodenal  bulb had some blood welling up in it.  After lavaging this I could see a  pinpoint streaming point of blood, apparently from a tiny AVM.  The  BICAP probe was used to obliterate the lesion with on the setting of  150/20.  This resulted in prompt cessation of bleeding and complete  blanching of the surrounding tissue.   The scope was then withdrawn and the patient returned to the recovery  room in stable condition.  She tolerated the procedure well and there  were no immediate complications.   IMPRESSION:  Actively bleeding duodenal arteriovenous malformation  fulgurated with the BICAP probe with short-term cessation of bleeding.   PLAN:   Would continue proton pump inhibitor and suspect her hemoglobin  would drop further requiring transfusion.  Will monitor stools and  hemoglobin.           ______________________________  Everardo All Madilyn Fireman, M.D.     JCH/MEDQ  D:  07/13/2009  T:  07/13/2009  Job:  [id number]

## 2011-03-30 NOTE — H&P (Signed)
Molly [name], BRANDNER                       ACCOUNT NO.:  [id number]   MEDICAL RECORD NO.:  [id number]                   PATIENT TYPE:  INP   LOCATION:  NA                                   FACILITY:  [facility]   PHYSICIAN:  Michael L. Thad Ranger, M.D.           DATE OF BIRTH:  December 27, 1922   DATE OF ADMISSION:  10/19/2003  DATE OF DISCHARGE:                                HISTORY & PHYSICAL   CHIEF COMPLAINT:  Right-sided numbness.   HISTORY OF PRESENT ILLNESS:  This is the initial [facility] stroke service  admission for this 77 year old woman with a past medical history which  includes hypertension and elevated glucose.  The patient reports that her  blood pressure has been particularly high for the past two months, it was  unusually high today.  In addition, she noticed some new symptoms which she  did not have before, and she had very sudden onset of numbness involving the  right hand without definite weakness earlier this morning which lasted about  10 minutes and then completely resolved.  Shortly after that, she had some  numbness of the right side of the face, not definitely associated with  facial droop or dysarthria, lasting about 5 minutes, and then completing  resolving.  She has had no further symptoms, and feels that she is at her  normal baseline.  She denies ever having previous symptoms before.  She  denies any associated chest pain or shortness of breath.  She comes to the  emergency room for an evaluation of this, and neurologic consultation is  subsequently requested.   PAST MEDICAL HISTORY:  1. Hypertension for over 30 years.  She says this has been worse over the     past two months.  Has been refractory to several medication changes made     by her primary physician, Dr. Lendell Caprice.  2. History of diabetes with some significantly elevated glucoses in the 110     to 140 range, but is presently on no medications.  3. Thyroid nodule for which she is taking  Synthroid.  4. Hiatal hernia.   FAMILY HISTORY:  Remarkable for hypertension, negative for strokes.   SOCIAL HISTORY:  She has never smoked.   ALLERGIES:  1. CODEINE.  2. LIBRIUM.  3. DEMEROL.   MEDICATIONS:  1. Synthroid, uncertain dose.  2. Bisoprolol 5 mg q. day.  3. Moexipril 15 mg q. day.  4. She also had a recent course of Flagyl for a presumed UTI.   REVIEW OF SYSTEMS:  She reports that she keeps a headache when she  describes her hypertension and as well as sinus.  She recently was treated  for an urinary tract infection, and said that she has had persistent  symptoms of pelvic pain which she describes as needles in my vagina.  Otherwise, 10 system review of systems is entirely negative.   PHYSICAL EXAMINATION:  VITAL SIGNS:  Temperature 98.5,  blood pressure  194/98, pulse 81, respirations 20.  GENERAL:  This is an awake and alert woman laying supine in hospital bed in  no evident distress.  HEENT:  Head:  Cranium is normocephalic, atraumatic.  Oropharynx is benign.  NECK:  Supple without carotid bruit.  HEART:  Regular rate and rhythm without murmurs.  CHEST:  Clear to auscultation.  ABDOMEN:  Soft and benign.  EXTREMITIES:  2+ pulses.  NEUROLOGIC:  Mental status:  She is awake and alert.  Speech is slow, but  not dysarthric.  Mood is euthymic.  Affect appropriate.  Awake, alert, and  fully oriented.  Speech is slow, but not dysarthric.  Mood is euthymic and  affect is appropriate.  Cranial nerves:  Funduscopic exam is benign.  Pupils  are equal and briskly reactive.  Extraocular movements are full without  nystagmus.  Visual fields are full to confrontation.  Face, tongue, and  palate move normally and symmetrical.  Facial sensation is intact to  pinprick.  Motor: Normal bulk and tone, normal strength in all tested  extremity muscles except for some weakness of the right hand related to give-  way which she describes to pain related to a ganglion cyst.  Sensation  is  intact to pinprick and light touch in all extremities.  Coordination:  Rapid  movements are performed well.  Finger-to-nose performed well.  Gait is  deferred.  Reflexes are symmetric.  Toes are downgoing.   LABORATORY DATA:  CBC:  White count 16.1, hemoglobin 14.2, platelets  253,000.  CMET is remarkable only for an elevated glucose of 134.  CT of the  head is personally reviewed and demonstrates moderate white matter disease  without an acute finding.   IMPRESSION:  Transient right body numbness in the setting of long-standing  hypertension and diabetes.  I suspect a small vessel transient ischemic  attack.   PLAN:  We will admit for routine workup including MRI/MRI, carotid Doppler,  carotid transcranial Dopplers, echocardiogram, stroke labs.  We will treat  at present with aspirin monotherapy.  Stroke service to follow.                                                Michael L. Thad Ranger, M.D.    MLR/MEDQ  D:  10/19/2003  T:  10/19/2003  Job:  [id number]

## 2011-03-30 NOTE — Cardiovascular Report (Signed)
[name], Molly                       ACCOUNT NO.:  [id number]   MEDICAL RECORD NO.:  [id number]                   PATIENT TYPE:  OIB   LOCATION:  2858                                 FACILITY:  [facility]   PHYSICIAN:  Aram Candela. Tysinger, M.D.              DATE OF BIRTH:  07-31-1923   DATE OF PROCEDURE:  11/15/2003  DATE OF DISCHARGE:                              CARDIAC CATHETERIZATION   PROCEDURES:  1. Abdominal aortogram.  2. Bilateral renal artery angiography.  3. Angio-Seal of the right femoral artery.   INDICATION FOR PROCEDURES:  This 77 year old female has a history of very  difficult to control hypertension and a recent admission for transient  ischemic attack in which she had a negative workup for cerebral vascular  disease.  She also had an MRI during that admission which showed bilateral  renal artery stenosis.  She had renal artery ultrasound study in the office  which showed mild elevation of flow velocity in both renal arteries, but not  consistent with the findings of the MRI.  With the severe blood pressure  greater than 200 which has been uncontrolled, it was felt that she needed  renal artery angiography to define her renal artery status.   PROCEDURE:  After signing an informed consent, the patient was premedicated  with 5 mg of Valium by mouth and brought to the peripheral vascular  angiography lab on the sixth floor at [facility].  Her right groin  was prepped and draped in sterile fashion and anesthetized locally with 1%  lidocaine.  A 6 French introducer sheath was inserted percutaneously into  the right femoral artery.  A 6 French short pigtail catheter was inserted  through the right femoral artery sheath and advanced to the mid abdominal  aorta.  Pressures were recorded and injections were made into the abdominal  aorta.  A 6 French short JR-4 catheter was inserted through the right  femoral artery sheath and advanced to the mid abdominal aorta.   Injections  were made into the right and left renal arteries.  The patient tolerated the  procedure well and no complications were noted.  At the end of the  procedure, the catheter and sheath were removed from the right femoral  artery and hemostasis was easily obtained with an Angio-Seal closure device.  The patient was then returned to the short stay unit for monitoring prior to  discharge later today.   CINE FINDINGS:  1. Abdominal aortogram:  The abdominal aorta is very normal appearing in the     infrarenal abdominal aortic area.  The suprarenal abdominal aorta is very     mildly dilated, but not aneurysmal in appearance.  There is late filling     with retrograde filling of the celiac artery indicating total occlusion     at its origin. The renal arteries appear to be fairly normal on the     abdominal aortogram.  The common iliac and external iliac arteries appear     normal.  2. Selective renal artery angiography:  Right renal artery:  The right renal     artery appears normal without evidence of significant plaque.  Left renal     artery:  There is a mild plaque at the origin of the left renal artery     causing a 20-30% narrowing.  There is normal flow into the left kidney.   FINAL DIAGNOSES:  1. Mild stenosis, 20% ostium of the left renal artery.  2. Normal appearing right renal artery.  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  4. Essentially normal appearing abdominal aorta with very mild dilation of     the suprarenal abdominal aorta and normal appearing infrarenal abdominal     aorta.  5. Normal right femoral artery and profundus.   DISPOSITION:  Will monitor in the short stay unit prior to discharge later  today.  Will continue on the same anti-hypertensive regimen at present and  follow her up in the office for consultation in one week.                                               John R. Aleen Campi, M.D.    JRT/MEDQ  D:  11/15/2003   T:  11/15/2003  Job:  [id number]

## 2011-03-30 NOTE — Discharge Summary (Signed)
NAME:  Molly [name], Molly [name]                       ACCOUNT NO.:  [id number]   MEDICAL RECORD NO.:  [id number]                   PATIENT TYPE:  EMS   LOCATION:  ED                                   FACILITY:  [facility]   PHYSICIAN:  Pramod P. Pearlean Brownie, MD                 DATE OF BIRTH:  1923-06-19   DATE OF ADMISSION:  10/19/2003  DATE OF DISCHARGE:  10/19/2003                                 DISCHARGE SUMMARY   ADMISSION DIAGNOSIS:  Right-sided numbness.   DISCHARGE DIAGNOSES:  1. Left hemispheric transient ischemic attack x2.  2. Refractory hypertension with left renal artery stenosis.  3. Borderline diabetes.  4. Hyperlipidemia.   PROCEDURES:  1. CT scan of the head.  2. MRI scan of the brain.  3. MRA of the brain.  4. Carotid ultrasound.  5. MRA of the renal arteries.  6. Cardiac echocardiogram.   HISTORY OF PRESENT ILLNESS:  The patient is a 77 year old lady who was  admitted with 2 episodes of transient right-sided numbness involving the  hand without definite weakness which lasted 10 minutes and resolved  completely. The patient had a normal neurological examination  when  evaluated in the emergency room. Her blood pressure was significantly  elevated from 180 to 210 systolic.   HOSPITAL COURSE:  She was admitted to the stroke service for risk  stratification workup, given her vascular risk factors of hypertension and  borderline diabetes. Telemetry monitoring did not reveal cardiac  arrhythmias. Cardiac echocardiogram  revealed normal ejection fraction and  no obvious cardioembolic source. An MRI scan of the brain showed  micro__________ changes but no definite acute infarction. An MRA of the  brain  revealed no significant stenosis. A carotid ultrasound revealed no  significant stenosis on either  side and only mild plaques.   Her hemoglobin A1C was elevated at 6.8. Total cholesterol  was normal at 183  but LDL was elevated at 118. Homocysteine was normal. The patient had  an  elevated white count of 16.1 on admission and repeated the next day it was  elevated further at 19.1 but there was no left shift.   She remained afebrile throughout the hospitalization  and denied symptoms of  dysuria or cough. A chest x-ray revealed  no infiltrates, and a repeat  urinalysis on October 21, 2003, also was normal. A white count repeated came  down to 16.1 without any left shift.   The patient had a complaint of some intermittent  lower abdominal pain and  had known  previous  history of diverticulitis. However, the pain subsided.  Hence it was decided not to work this up further.   The patient had been originally scheduled to have an outpatient MRA of the  renal arteries done on Saturday, October 23, 2003, and at her request this  test was done in the hospital  stay to prevent another visit for her. She  was  sedated for the test with Ativan 1 mg prior to the  procedure since she  had exhibited significant anxiety from the procedure. The MRA of the renal  arteries revealed stenosis of the left renal artery ostium as well as  narrowing in the middle 1/3rd region. The right renal artery was also a  small caliber but did not have focal stenosis.   The patient's blood pressure remained high throughout her hospitalization,  and hence the hypertensive therapy was changed. Hydrochlorothiazide was  added 25 mg  initially, an increase of 50 mg a day with only partial  improvement. Her Mavik dose was also increased to 4 mg a day. At the time of  discharge her discharge her blood pressure ranged from the 140s to 180s  systolic.   She was advised to follow up with her primary care physician, Dr. Lendell Caprice  next week and to call his office to make an appointment. She would discuss  the possibility of left renal angioplasty stenting with him as well as  treatment for her borderline diabetes and hyperlipidemia. She was advised to  take aspirin for stroke prevention.   DISCHARGE  MEDICATIONS:  1. Aspirin 325 mg p.o. once a day.  2. Mavik 4 mg once a day.  3. Zetia 10 mg b.i.d.  4. Hydrochlorothiazide 50 mg a day.  5. Synthroid 75 micrograms a day.  6. Potassium chloride 20 mEq once a day.                                                Pramod P. Pearlean Brownie, MD    PPS/MEDQ  D:  10/22/2003  T:  10/23/2003  Job:  [id number]   cc:   Janae Bridgeman. Eloise Harman., M.D.  [address]  [place]  [place] [address]  Fax: [phone]

## 2011-04-20 ENCOUNTER — Telehealth: Payer: Self-pay | Admitting: Cardiology

## 2011-04-20 NOTE — Telephone Encounter (Signed)
Patient having dental procedure and dentist rx'd amoxil 500 mg 4 1 hour prior to procedure secondary to orthopedic procedure.  Patient wanting to know if ok to take.  Advised ok if she was not allergic

## 2011-04-20 NOTE — Telephone Encounter (Signed)
Agree with advice given

## 2011-04-20 NOTE — Telephone Encounter (Signed)
PT HAS BEEN GIVEN A MED BY ANOTHER MD AND WANTS TO TALK TO MELINDA ABOUT IT. NEEDS TO TALK TO HER TODAY.

## 2011-06-08 ENCOUNTER — Encounter (HOSPITAL_BASED_OUTPATIENT_CLINIC_OR_DEPARTMENT_OTHER): Payer: Medicare Other | Admitting: Oncology

## 2011-06-08 ENCOUNTER — Other Ambulatory Visit: Payer: Self-pay | Admitting: Oncology

## 2011-06-08 DIAGNOSIS — C50419 Malignant neoplasm of upper-outer quadrant of unspecified female breast: Secondary | ICD-10-CM

## 2011-06-08 DIAGNOSIS — Z170 Estrogen receptor positive status [ER+]: Secondary | ICD-10-CM

## 2011-06-08 DIAGNOSIS — C7951 Secondary malignant neoplasm of bone: Secondary | ICD-10-CM

## 2011-07-25 ENCOUNTER — Encounter: Payer: Self-pay | Admitting: Cardiology

## 2011-08-02 ENCOUNTER — Encounter: Payer: Self-pay | Admitting: Cardiology

## 2011-08-02 ENCOUNTER — Ambulatory Visit (INDEPENDENT_AMBULATORY_CARE_PROVIDER_SITE_OTHER): Payer: Medicare Other | Admitting: Cardiology

## 2011-08-02 VITALS — BP 148/84 | HR 70 | Wt 220.0 lb

## 2011-08-02 DIAGNOSIS — I359 Nonrheumatic aortic valve disorder, unspecified: Secondary | ICD-10-CM

## 2011-08-02 DIAGNOSIS — R0609 Other forms of dyspnea: Secondary | ICD-10-CM

## 2011-08-02 DIAGNOSIS — E7800 Pure hypercholesterolemia, unspecified: Secondary | ICD-10-CM

## 2011-08-02 DIAGNOSIS — C7951 Secondary malignant neoplasm of bone: Secondary | ICD-10-CM

## 2011-08-02 DIAGNOSIS — R0600 Dyspnea, unspecified: Secondary | ICD-10-CM

## 2011-08-02 DIAGNOSIS — I358 Other nonrheumatic aortic valve disorders: Secondary | ICD-10-CM

## 2011-08-02 DIAGNOSIS — I119 Hypertensive heart disease without heart failure: Secondary | ICD-10-CM

## 2011-08-02 DIAGNOSIS — I493 Ventricular premature depolarization: Secondary | ICD-10-CM

## 2011-08-02 DIAGNOSIS — C50919 Malignant neoplasm of unspecified site of unspecified female breast: Secondary | ICD-10-CM | POA: Insufficient documentation

## 2011-08-02 NOTE — Assessment & Plan Note (Signed)
The patient has a past history of essential hypertension.  She has not been having any headaches or dizziness.  She has had some mild peripheral edema.  Since last visit her weight is up 10 pounds.

## 2011-08-02 NOTE — Progress Notes (Signed)
Molly [name] Date of Birth:  [birth year] [facility] [address]   [address] [place], [place]  [address] [phone]           Fax   [phone]  History of Present Illness: This pleasant 77 year old woman is seen for a six-month followup office visit.  She has a complex past medical history.  She has a history of breast cancer of the right breast.  She has had a pathologic fracture of her left hip secondary to metastatic disease.  In November 2011 she underwent a half hip replacement by Dr. Charlann Boxer for the pathological fracture.The patient has a history of diabetes and essential hypertension.  She has a history of hypothyroidism.  He has a history of fatty liver does not have any history of ischemic heart disease and she had a normal nuclear stress test on 01/28/08.  She's had a history of hypercholesterolemia.  Current Outpatient Prescriptions  Medication Sig Dispense Refill  . amLODipine-valsartan (EXFORGE) 10-320 MG per tablet Take 1 tablet by mouth daily.        Marland Kitchen anastrozole (ARIMIDEX) 1 MG tablet Take 1 mg by mouth daily.        Marland Kitchen atenolol (TENORMIN) 25 MG tablet Take 25 mg by mouth daily.        . Calcium Carbonate-Vitamin D (CALCIUM + D PO) Take by mouth daily.        . Cyanocobalamin (VITAMIN B-12 PO) Take by mouth daily.        . furosemide (LASIX) 40 MG tablet Take 40 mg by mouth as needed.        Marland Kitchen glimepiride (AMARYL) 2 MG tablet Take 2 mg by mouth daily before breakfast.        . levothyroxine (SYNTHROID, LEVOTHROID) 75 MCG tablet Take 75 mcg by mouth daily.        . metFORMIN (GLUCOPHAGE) 500 MG tablet Take 500 mg by mouth 2 (two) times daily with a meal.        . pantoprazole (PROTONIX) 40 MG tablet Take 40 mg by mouth as needed.        . potassium chloride (KLOR-CON M10) 10 MEQ tablet          Allergies  Allergen Reactions  . Codeine   . Demerol   . Librium   . Lipitor (Atorvastatin Calcium)   . Metoprolol     There is no problem  list on file for this patient.   History  Smoking status  . Never Smoker   Smokeless tobacco  . Not on file    History  Alcohol Use No    Family History  Problem Relation Age of Onset  . Hypertension Mother   . Heart attack Father   . Hypertension Father   . Diabetes Sister   . Hypertension Sister   . Liver cancer Sister   . Heart attack Brother   . Hypertension Brother   . Diabetes Brother   . Diabetes Sister   . Hypertension Sister     Review of Systems: Constitutional: no fever chills diaphoresis or fatigue or change in weight.  Head and neck: no hearing loss, no epistaxis, no photophobia or visual disturbance. Respiratory: No cough, shortness of breath or wheezing. Cardiovascular: No chest pain peripheral edema, palpitations. Gastrointestinal: No abdominal distention, no abdominal pain, no change in bowel habits hematochezia or melena. Genitourinary: No dysuria, no frequency, no urgency, no nocturia. Musculoskeletal:No arthralgias, no back pain, no gait disturbance or  myalgias. Neurological: No dizziness, no headaches, no numbness, no seizures, no syncope, no weakness, no tremors. Hematologic: No lymphadenopathy, no easy bruising. Psychiatric: No confusion, no hallucinations, no sleep disturbance.    Physical Exam: Filed Vitals:   08/02/11 1530  BP: 148/84  Pulse: 70  The general appearance reveals an elderly woman in no acute distressPupils equal and reactive.   Extraocular Movements are full.  There is no scleral icterus.  The mouth and pharynx are normal.  The neck is supple.  The carotids reveal no bruits.  The jugular venous pressure is normal.  The thyroid is not enlarged.  There is no lymphadenopathy.  The chest is clear to percussion and auscultation. There are no rales or rhonchi. Expansion of the chest is symmetrical.  The precordium is quiet.  The first heart sound is normal.  The second heart sound is physiologically split.  There is no  gallop rub  or click.There is a soft systolic murmur of known aortic valve sclerosis  There is no abnormal lift or heave.Occasional PVCs are present. The abdomen is soft and nontender. Bowel sounds are normal. The liver and spleen are not enlarged. There Are no abdominal masses. There are no bruits.  The pedal pulses are good.  There is no phlebitis But there is mild edema.  There is no cyanosis or clubbing.  Strength is normal and symmetrical in all extremities.  There is no lateralizing weakness.  There are no sensory deficits.  The skin is warm and dry.  There is no rash.        Assessment / Plan: Continue same medication.

## 2011-08-02 NOTE — Assessment & Plan Note (Signed)
The patient is not currently on any medication for cholesterol.  We will plan to check fasting labs next time.

## 2011-08-02 NOTE — Assessment & Plan Note (Signed)
The patient is on Arimidex 1 mg daily for her breast cancer.She does have ongoing pain in the left hip from her pathologic fracture.  She attributes her medication to her 10 pound weight gain with fluid retention

## 2011-08-07 LAB — DIFFERENTIAL
Lymphs Abs: 4.2 — ABNORMAL HIGH
Monocytes Relative: 7
Neutro Abs: 9.4 — ABNORMAL HIGH
Neutrophils Relative %: 63

## 2011-08-07 LAB — BASIC METABOLIC PANEL
CO2: 25
Chloride: 98
Glucose, Bld: 107 — ABNORMAL HIGH
Potassium: 4.2
Sodium: 133 — ABNORMAL LOW

## 2011-08-07 LAB — CBC
RBC: 4.64
WBC: 14.9 — ABNORMAL HIGH

## 2011-08-20 LAB — COMPREHENSIVE METABOLIC PANEL
ALT: 67 — ABNORMAL HIGH
CO2: 23
Calcium: 8.9
Creatinine, Ser: 0.79
GFR calc non Af Amer: >60
Glucose, Bld: 115 — ABNORMAL HIGH
Sodium: 135

## 2011-08-20 LAB — POCT CARDIAC MARKERS
CKMB, poc: 4
Myoglobin, poc: 134
Operator id: 288831
Troponin i, poc: <0.05

## 2011-08-20 LAB — DIFFERENTIAL
Basophils Relative: 1
Eosinophils Absolute: 0.1 — ABNORMAL LOW
Lymphs Abs: 3.5
Neutrophils Relative %: 70

## 2011-08-20 LAB — CBC
HCT: 40.3
Hemoglobin: 13.8
MCHC: 34.2
MCV: 89
RBC: 4.54

## 2011-10-11 ENCOUNTER — Other Ambulatory Visit: Payer: Self-pay | Admitting: Oncology

## 2011-10-11 ENCOUNTER — Ambulatory Visit (HOSPITAL_BASED_OUTPATIENT_CLINIC_OR_DEPARTMENT_OTHER): Payer: Medicare Other | Admitting: Oncology

## 2011-10-11 ENCOUNTER — Other Ambulatory Visit (HOSPITAL_BASED_OUTPATIENT_CLINIC_OR_DEPARTMENT_OTHER): Payer: Medicare Other | Admitting: Lab

## 2011-10-11 VITALS — BP 149/73 | HR 72 | Temp 97.0°F | Ht 65.0 in | Wt 222.0 lb

## 2011-10-11 DIAGNOSIS — C7951 Secondary malignant neoplasm of bone: Secondary | ICD-10-CM

## 2011-10-11 DIAGNOSIS — C50919 Malignant neoplasm of unspecified site of unspecified female breast: Secondary | ICD-10-CM

## 2011-10-11 DIAGNOSIS — C50419 Malignant neoplasm of upper-outer quadrant of unspecified female breast: Secondary | ICD-10-CM

## 2011-10-11 DIAGNOSIS — E119 Type 2 diabetes mellitus without complications: Secondary | ICD-10-CM

## 2011-10-11 NOTE — Progress Notes (Signed)
OFFICE PROGRESS NOTE   INTERVAL HISTORY:   Molly [name] returns as scheduled. She continues Arimidex. She denies hot flashes. She has minor discomfort at the left hip. She reports intermittent bilateral leg swelling.  Objective:  Vital signs in last 24 hours:  Blood pressure 149/73, pulse 72, temperature 97 F (36.1 C), temperature source Oral, height 5\' 5"  (1.651 m), weight 222 lb (100.699 kg).    HEENT: Neck without mass Lymphatics: No cervical, supraclavicular, or axillary Resp: Lungs clear bile Cardio: Regular rate and GI: No hepatomegaly Vascular: Trace edema at the left greater than right lower low  Breast: Status post right lumpectomy. There is firm tissue surrounding the inferior aspect of the lumpectomy scar. No discrete mass. No mass in either breast.   Medications: I have reviewed the patient's current medications.  Assessment/Plan: 1. Breast cancer, right-sided breast cancer (T2 N2) diagnosed in 2009, status post a right lumpectomy and adjuvant radiation.  She was diagnosed with a pathologic left femur fracture in November 2011.  A staging bone scan and CT scan in November 2011 confirmed multiple bone metastases and a cystic metastasis at the left iliac muscle.  No visceral metastases were seen.  She began Arimidex following an office visit 11/07/2010.  The Arimidex was held for approximately 1 week after an office visit on 02/09/2011 and then resumed on 02/16/2011.  a. The elevated CA 27.29 was improved on 02/09/2011. 2. Status post a left hip hemiarthroplasty for treatment of a pathologic fracture 10/07/2010. 3. Status post radiation to the left proximal femur 12/22 through 11/17/2010. 4. Diabetes. 5. Hypertension. 6. History of arthralgias, likely related to degenerative arthritis   Disposition: [name] appears stable. She will continue Arimidex. We obtained a repeat CA 27.29 today. She will return for an office visit in 4 months. There is no clinical evidence  for progression of the breast cancer. She declines Zometa therapy. We will consider a restaging bone scan after the next office visit.   Lucile Shutters, MD  10/11/2011  4:07 PM

## 2011-10-12 LAB — CANCER ANTIGEN 27.29: CA 27.29: 16 U/mL (ref 0–39)

## 2011-10-15 ENCOUNTER — Telehealth: Payer: Self-pay | Admitting: *Deleted

## 2011-10-15 NOTE — Telephone Encounter (Signed)
Made patient aware of Ca 27.29 result. Do not see that CBC was processed. Will follow up on 10/16/11.

## 2011-10-15 NOTE — Telephone Encounter (Signed)
Message copied by Wandalee Ferdinand on Mon Oct 15, 2011  6:51 PM ------      Message from: Ladene Artist      Created: Sun Oct 14, 2011 11:37 AM       Please call patient, ca27.29 is nl, continue arimidex.      Was cbc done? We ordered 11/29.

## 2011-10-16 ENCOUNTER — Other Ambulatory Visit: Payer: Self-pay | Admitting: Oncology

## 2011-10-16 DIAGNOSIS — C50919 Malignant neoplasm of unspecified site of unspecified female breast: Secondary | ICD-10-CM

## 2011-10-16 LAB — CBC WITH DIFFERENTIAL/PLATELET
BASO%: 0.3 % (ref 0.0–2.0)
EOS%: 1.4 % (ref 0.0–7.0)
MCH: 28.2 pg (ref 25.1–34.0)
MCHC: 32.8 g/dL (ref 31.5–36.0)
MONO#: 0.8 10*3/uL (ref 0.1–0.9)
NEUT%: 62.2 % (ref 38.4–76.8)
RBC: 4.47 10*6/uL (ref 3.70–5.45)
WBC: 15.1 10*3/uL — ABNORMAL HIGH (ref 3.9–10.3)
lymph#: 4.7 10*3/uL — ABNORMAL HIGH (ref 0.9–3.3)

## 2011-10-17 ENCOUNTER — Telehealth: Payer: Self-pay | Admitting: *Deleted

## 2011-10-17 NOTE — Telephone Encounter (Signed)
Made patient aware of CBC results---OK per MD. Follow up as scheduled.

## 2011-11-12 ENCOUNTER — Other Ambulatory Visit: Payer: Self-pay | Admitting: Cardiology

## 2011-11-12 MED ORDER — AMLODIPINE BESYLATE-VALSARTAN 10-320 MG PO TABS: 1.0000 | ORAL_TABLET | Freq: Every day | ORAL | Status: DC

## 2011-11-17 ENCOUNTER — Other Ambulatory Visit: Payer: Self-pay | Admitting: Oncology

## 2011-11-17 DIAGNOSIS — C50519 Malignant neoplasm of lower-outer quadrant of unspecified female breast: Secondary | ICD-10-CM

## 2011-11-17 DIAGNOSIS — C7951 Secondary malignant neoplasm of bone: Secondary | ICD-10-CM

## 2012-01-02 ENCOUNTER — Other Ambulatory Visit: Payer: Self-pay | Admitting: Family Medicine

## 2012-01-02 DIAGNOSIS — Z853 Personal history of malignant neoplasm of breast: Secondary | ICD-10-CM

## 2012-01-12 ENCOUNTER — Other Ambulatory Visit: Payer: Self-pay | Admitting: Internal Medicine

## 2012-01-12 DIAGNOSIS — N390 Urinary tract infection, site not specified: Secondary | ICD-10-CM

## 2012-01-12 MED ORDER — CIPROFLOXACIN HCL 500 MG PO TABS
500.0000 mg | ORAL_TABLET | Freq: Two times a day (BID) | ORAL | Status: AC
Start: 2012-01-12 — End: 2012-01-22

## 2012-01-31 ENCOUNTER — Encounter: Payer: Self-pay | Admitting: Cardiology

## 2012-01-31 ENCOUNTER — Ambulatory Visit (INDEPENDENT_AMBULATORY_CARE_PROVIDER_SITE_OTHER): Payer: Medicare Other | Admitting: Cardiology

## 2012-01-31 VITALS — BP 148/80 | HR 74 | Ht 65.0 in | Wt 220.0 lb

## 2012-01-31 DIAGNOSIS — E119 Type 2 diabetes mellitus without complications: Secondary | ICD-10-CM

## 2012-01-31 DIAGNOSIS — I059 Rheumatic mitral valve disease, unspecified: Secondary | ICD-10-CM

## 2012-01-31 DIAGNOSIS — I119 Hypertensive heart disease without heart failure: Secondary | ICD-10-CM

## 2012-01-31 DIAGNOSIS — C801 Malignant (primary) neoplasm, unspecified: Secondary | ICD-10-CM

## 2012-01-31 DIAGNOSIS — E876 Hypokalemia: Secondary | ICD-10-CM

## 2012-01-31 DIAGNOSIS — C50919 Malignant neoplasm of unspecified site of unspecified female breast: Secondary | ICD-10-CM

## 2012-01-31 DIAGNOSIS — C7952 Secondary malignant neoplasm of bone marrow: Secondary | ICD-10-CM

## 2012-01-31 DIAGNOSIS — E7800 Pure hypercholesterolemia, unspecified: Secondary | ICD-10-CM

## 2012-01-31 DIAGNOSIS — I341 Nonrheumatic mitral (valve) prolapse: Secondary | ICD-10-CM

## 2012-01-31 DIAGNOSIS — C7951 Secondary malignant neoplasm of bone: Secondary | ICD-10-CM

## 2012-01-31 MED ORDER — POTASSIUM CHLORIDE CRYS ER 10 MEQ PO TBCR: 10.0000 meq | EXTENDED_RELEASE_TABLET | Freq: Every day | ORAL | Status: DC

## 2012-01-31 MED ORDER — ATENOLOL 25 MG PO TABS: 25.0000 mg | ORAL_TABLET | Freq: Every day | ORAL | Status: DC

## 2012-01-31 NOTE — Assessment & Plan Note (Signed)
The patient has a history of high cholesterol.  She is intolerant of statins.  She is on ezetimibe which she is tolerating well.  Her lipids are followed by her primary care physician at [facility]

## 2012-01-31 NOTE — Progress Notes (Signed)
Lanae Crumbly Date of Birth:  [birth year] [facility] [address] [place], [place]  [address] [phone]         Fax   [phone]  History of Present Illness: This 78 year old woman is seen for a scheduled followup office visit.  He has a past history of essential hypertension and hypercholesterolemia.  She also has a history of aortic valve sclerosis.  She has had a history of arrhythmia with PVCs.  She has a history of breast cancer with metastasis to bone and she had a previous pathologic fracture of her left hip.  He does not have any history of ischemic heart disease and she had a normal nuclear stress test in 2009.  She still lives by herself.  She does not have a life alert alarm system but I suggested that she look into getting one since she lives alone.  Current Outpatient Prescriptions  Medication Sig Dispense Refill  . amLODipine-valsartan (EXFORGE) 10-320 MG per tablet Take 1 tablet by mouth daily.  30 tablet  11  . anastrozole (ARIMIDEX) 1 MG tablet TAKE 1 TABLET BY MOUTH EVERY DAY  30 tablet  12  . atenolol (TENORMIN) 25 MG tablet Take 1 tablet (25 mg total) by mouth daily.  30 tablet  11  . Calcium Carbonate-Vitamin D (CALCIUM + D PO) Take by mouth daily.        . Cyanocobalamin (VITAMIN B-12 PO) Take by mouth daily.        . furosemide (LASIX) 40 MG tablet Take 40 mg by mouth as needed.        Marland Kitchen glimepiride (AMARYL) 2 MG tablet Take 2 mg by mouth daily before breakfast.        . levothyroxine (SYNTHROID, LEVOTHROID) 75 MCG tablet Take 75 mcg by mouth daily.        . metFORMIN (GLUCOPHAGE) 500 MG tablet Take 500 mg by mouth 2 (two) times daily with a meal.        . pantoprazole (PROTONIX) 40 MG tablet Take 40 mg by mouth as needed.        . potassium chloride (KLOR-CON M10) 10 MEQ tablet Take 1 tablet (10 mEq total) by mouth daily.  30 tablet  11  . fluticasone (FLONASE) 50 MCG/ACT nasal spray as needed.      . promethazine (PHENERGAN) 25 MG  tablet as needed.      Marland Kitchen ZETIA 10 MG tablet Daily.        Allergies  Allergen Reactions  . Codeine   . Demerol   . Librium   . Lipitor (Atorvastatin Calcium)   . Metoprolol     Patient Active Problem List  Diagnoses  . Benign hypertensive heart disease without heart failure  . Pure hypercholesterolemia  . Asymptomatic PVCs  . Breast cancer metastasized to bone    History  Smoking status  . Never Smoker   Smokeless tobacco  . Not on file    History  Alcohol Use No    Family History  Problem Relation Age of Onset  . Hypertension Mother   . Heart attack Father   . Hypertension Father   . Diabetes Sister   . Hypertension Sister   . Liver cancer Sister   . Heart attack Brother   . Hypertension Brother   . Diabetes Brother   . Diabetes Sister   . Hypertension Sister     Review of Systems: Constitutional: no fever chills diaphoresis or fatigue or change in weight.  Head and neck: no hearing loss, no epistaxis, no photophobia or visual disturbance. Respiratory: No cough, shortness of breath or wheezing. Cardiovascular: No chest pain peripheral edema, palpitations. Gastrointestinal: No abdominal distention, no abdominal pain, no change in bowel habits hematochezia or melena. Genitourinary: No dysuria, no frequency, no urgency, no nocturia. Musculoskeletal:No arthralgias, no back pain, no gait disturbance or myalgias. Neurological: No dizziness, no headaches, no numbness, no seizures, no syncope, no weakness, no tremors. Hematologic: No lymphadenopathy, no easy bruising. Psychiatric: No confusion, no hallucinations, no sleep disturbance.    Physical Exam: Filed Vitals:   01/31/12 1439  BP: 148/80  Pulse: 74   the general appearance reveals a well-developed well-nourished elderly woman in no distress.  She arrived in a wheelchair because of her hip problem.Pupils equal and reactive.   Extraocular Movements are full.  There is no scleral icterus.  The mouth and  pharynx are normal.  The neck is supple.  The carotids reveal no bruits.  The jugular venous pressure is normal.  The thyroid is not enlarged.  There is no lymphadenopathy.  The chest is clear to percussion and auscultation. There are no rales or rhonchi. Expansion of the chest is symmetrical.  The precordium is quiet.  The first heart sound is normal.  The second heart sound is physiologically split.  There is no murmur gallop rub or click.  There is no abnormal lift or heave.  The abdomen is soft and nontender. Bowel sounds are normal. The liver and spleen are not enlarged. There Are no abdominal masses. There are no bruits.  Extremities reveal mild bilateral edema worse on the right and no evidence of phlebitis.The skin is warm and dry.  There is no rash. Strength is normal and symmetrical in all extremities.  There is no lateralizing weakness.  There are no sensory deficits.  EKG today shows normal sinus rhythm and right bundle branch block which is old   Assessment / Plan: Continue same medication.  Recheck in 6 months for followup office visit.

## 2012-01-31 NOTE — Assessment & Plan Note (Signed)
The patient has had no further evidence of bony metastasis.  She is doing well status post her left hip surgery

## 2012-01-31 NOTE — Assessment & Plan Note (Signed)
The patient has not been experiencing any chest pain or shortness of breath.  She's had no headaches or dizzy spells.  She states that her blood pressure at home is up and down

## 2012-01-31 NOTE — Patient Instructions (Signed)
Your physician recommends that you continue on your current medications as directed. Please refer to the Current Medication list given to you today.  Your physician wants you to follow-up in: 6 months. You will receive a reminder letter in the mail two months in advance. If you don't receive a letter, please call our office to schedule the follow-up appointment.  

## 2012-02-01 ENCOUNTER — Telehealth: Payer: Self-pay | Admitting: Oncology

## 2012-02-01 ENCOUNTER — Ambulatory Visit (HOSPITAL_BASED_OUTPATIENT_CLINIC_OR_DEPARTMENT_OTHER): Payer: PRIVATE HEALTH INSURANCE | Admitting: Oncology

## 2012-02-01 ENCOUNTER — Other Ambulatory Visit: Payer: Self-pay | Admitting: Oncology

## 2012-02-01 ENCOUNTER — Other Ambulatory Visit (HOSPITAL_BASED_OUTPATIENT_CLINIC_OR_DEPARTMENT_OTHER): Payer: Medicare Other | Admitting: Lab

## 2012-02-01 VITALS — BP 183/79 | HR 76 | Temp 98.6°F | Ht 65.0 in | Wt 219.5 lb

## 2012-02-01 DIAGNOSIS — C50919 Malignant neoplasm of unspecified site of unspecified female breast: Secondary | ICD-10-CM

## 2012-02-01 DIAGNOSIS — D72829 Elevated white blood cell count, unspecified: Secondary | ICD-10-CM

## 2012-02-01 DIAGNOSIS — C50419 Malignant neoplasm of upper-outer quadrant of unspecified female breast: Secondary | ICD-10-CM

## 2012-02-01 DIAGNOSIS — C7951 Secondary malignant neoplasm of bone: Secondary | ICD-10-CM

## 2012-02-01 LAB — CBC WITH DIFFERENTIAL/PLATELET
Basophils Absolute: 0 10*3/uL (ref 0.0–0.1)
EOS%: 1.1 % (ref 0.0–7.0)
Eosinophils Absolute: 0.2 10*3/uL (ref 0.0–0.5)
HGB: 11.9 g/dL (ref 11.6–15.9)
MCH: 27.4 pg (ref 25.1–34.0)
MCV: 83.6 fL (ref 79.5–101.0)
MONO%: 4.9 % (ref 0.0–14.0)
NEUT#: 10.1 10*3/uL — ABNORMAL HIGH (ref 1.5–6.5)
RBC: 4.34 10*6/uL (ref 3.70–5.45)
RDW: 14.2 % (ref 11.2–14.5)
lymph#: 5.9 10*3/uL — ABNORMAL HIGH (ref 0.9–3.3)
nRBC: 0 % (ref 0–0)

## 2012-02-01 LAB — CANCER ANTIGEN 27.29: CA 27.29: 18 U/mL (ref 0–39)

## 2012-02-01 NOTE — Progress Notes (Signed)
OFFICE PROGRESS NOTE   INTERVAL HISTORY:   She continues Arimidex. She did not have significant hot flashes or arthralgias. She has intermittent discomfort at the "hip ". Her chief complaint is malaise. She has a good appetite.  Objective:  Vital signs in last 24 hours:  Blood pressure 183/79, pulse 76, temperature 98.6 F (37 C), temperature source Oral, height 5\' 5"  (1.651 m), weight 219 lb 8 oz (99.565 kg).    HEENT: Neck without mass Lymphatics: No cervical, supraclavicular, or axillary nodes Resp: Lungs clear bilaterally Cardio: Regular rate and rhythm GI: No hepatomegaly Vascular: No leg edema Breasts: Status post right lumpectomy. Firm tissue surrounding the lumpectomy scar and upper right breast. No discrete mass in either breast.      Lab Results:  Lab Results  Component Value Date   WBC 17.0* 02/01/2012   HGB 11.9 02/01/2012   HCT 36.3 02/01/2012   MCV 83.6 02/01/2012   PLT 283 Large & giant platelets 02/01/2012   ANC 9.4  CA 27.29, 16 on 10/11/2011 Medications: I have reviewed the patient's current medications.  Assessment/Plan: 1. Breast cancer, right-sided breast cancer (T2 N2) diagnosed in 2009, status post a right lumpectomy and adjuvant radiation. She was diagnosed with a pathologic left femur fracture in November 2011. A staging bone scan and CT scan in November 2011 confirmed multiple bone metastases and a cystic metastasis at the left iliac muscle. No visceral metastases were seen. She began Arimidex following an office visit 11/07/2010. The Arimidex was held for approximately 1 week after an office visit on 02/09/2011 and then resumed on 02/16/2011.  a. The elevated CA 27.29 was improved on 02/09/2011. 2. Status post a left hip hemiarthroplasty for treatment of a pathologic fracture 10/07/2010. 3. Status post radiation to the left proximal femur 12/22 through 11/17/2010. 4. Diabetes. 5. Hypertension. 6. History of arthralgias, likely related to  degenerative arthritis 7. Leukocytosis/lymphocytosis-chronic. I will review the peripheral blood smear. She may have CLL 8. Malaise-I have a low clinical suspicion for progression of the breast cancer.   Disposition:  There is no clinical evidence for progression of the breast cancer. She will continue Arimidex.  The mild leukocytosis may indicate chronic lymphocytic leukemia. This is likely an incidental finding. I doubt the malaise is related to the high white count.  She will return for an office visit in 6 months.   Lucile Shutters, MD  02/01/2012  5:19 PM

## 2012-02-01 NOTE — Telephone Encounter (Signed)
Gv pt appt for sept2013 

## 2012-02-02 LAB — COMPREHENSIVE METABOLIC PANEL
ALT: 25 U/L (ref 0–35)
Albumin: 3.5 g/dL (ref 3.5–5.2)
Alkaline Phosphatase: 93 U/L (ref 39–117)
Potassium: 3.6 mEq/L (ref 3.5–5.3)
Sodium: 136 mEq/L (ref 135–145)
Total Bilirubin: 0.3 mg/dL (ref 0.3–1.2)
Total Protein: 6 g/dL (ref 6.0–8.3)

## 2012-02-06 ENCOUNTER — Telehealth: Payer: Self-pay | Admitting: *Deleted

## 2012-02-06 NOTE — Telephone Encounter (Signed)
Message from pt requesting lab results. Returned call, results given. Pt reports her primary care MD gave her antibiotics for sinus infection. Asking if this could be reason for elevated WBC. Yes, but they have been mildly elevated for a while. Will review with Dr. Truett Perna.

## 2012-02-08 ENCOUNTER — Ambulatory Visit
Admission: RE | Admit: 2012-02-08 | Discharge: 2012-02-08 | Disposition: A | Discharge status: Home / Self Care | Payer: PRIVATE HEALTH INSURANCE | Source: Ambulatory Visit | Attending: Family Medicine | Admitting: Family Medicine

## 2012-02-08 DIAGNOSIS — Z853 Personal history of malignant neoplasm of breast: Secondary | ICD-10-CM

## 2012-02-13 ENCOUNTER — Encounter: Payer: Self-pay | Admitting: Oncology

## 2012-02-13 NOTE — Progress Notes (Signed)
When she was here on 02/01/2012 the white count was elevated at 17,000 with an absolute lymphocyte count of 5900. Review of the chart confirms a chronically elevated white blood count. I reviewed the peripheral blood smear and there are increased number of lymphocytes. There are smudge cells and "basket "cells. Large/giant platelets. The lymphocytes appears mature.  I discussed the CBC findings with Molly [name] by telephone. She most likely has chronic lymphocytic leukemia. She appears asymptomatic from the CLL. She denies fever, night sweats, and anorexia. She continues to have malaise.  She will return for a lab visit within the next few months to confirm a diagnosis of CLL.  She will contact us in the interim for new symptoms.

## 2012-02-15 ENCOUNTER — Other Ambulatory Visit: Payer: Self-pay | Admitting: *Deleted

## 2012-02-15 ENCOUNTER — Encounter: Payer: Self-pay | Admitting: *Deleted

## 2012-02-15 DIAGNOSIS — C9110 Chronic lymphocytic leukemia of B-cell type not having achieved remission: Secondary | ICD-10-CM

## 2012-02-15 NOTE — Progress Notes (Signed)
Mailed 02/01/12 labs to home per MD request. Placed orders for CBC/diff and flow cytometry in 2 months.

## 2012-02-19 ENCOUNTER — Telehealth: Payer: Self-pay | Admitting: Oncology

## 2012-02-19 NOTE — Telephone Encounter (Signed)
called to scheduled appt for june pt choose 06/27

## 2012-03-04 ENCOUNTER — Telehealth (INDEPENDENT_AMBULATORY_CARE_PROVIDER_SITE_OTHER): Payer: Self-pay

## 2012-03-04 NOTE — Telephone Encounter (Addendum)
Patient would like an afternoon appointment with Dr. Luisa Hart to remove a knot on her back.  She wants Dr. Luisa Hart however he does not have any PM appointments available soon.  Patient offered 11:30 am however declined. Information will be sent to [facility] to see if she can find an appointment.

## 2012-03-24 ENCOUNTER — Encounter (HOSPITAL_COMMUNITY): Payer: Self-pay | Admitting: Emergency Medicine

## 2012-03-24 ENCOUNTER — Emergency Department (HOSPITAL_COMMUNITY)
Admission: EM | Admit: 2012-03-24 | Discharge: 2012-03-24 | Disposition: A | Discharge status: Home / Self Care | Payer: PRIVATE HEALTH INSURANCE | Attending: Emergency Medicine | Admitting: Emergency Medicine

## 2012-03-24 ENCOUNTER — Emergency Department (HOSPITAL_COMMUNITY): Payer: PRIVATE HEALTH INSURANCE

## 2012-03-24 DIAGNOSIS — E119 Type 2 diabetes mellitus without complications: Secondary | ICD-10-CM | POA: Insufficient documentation

## 2012-03-24 DIAGNOSIS — I059 Rheumatic mitral valve disease, unspecified: Secondary | ICD-10-CM | POA: Insufficient documentation

## 2012-03-24 DIAGNOSIS — M549 Dorsalgia, unspecified: Secondary | ICD-10-CM | POA: Insufficient documentation

## 2012-03-24 DIAGNOSIS — E039 Hypothyroidism, unspecified: Secondary | ICD-10-CM | POA: Insufficient documentation

## 2012-03-24 DIAGNOSIS — M25559 Pain in unspecified hip: Secondary | ICD-10-CM | POA: Insufficient documentation

## 2012-03-24 DIAGNOSIS — I10 Essential (primary) hypertension: Secondary | ICD-10-CM | POA: Insufficient documentation

## 2012-03-24 DIAGNOSIS — M25551 Pain in right hip: Secondary | ICD-10-CM

## 2012-03-24 LAB — URINALYSIS, ROUTINE W REFLEX MICROSCOPIC
Bilirubin Urine: NEGATIVE
Glucose, UA: NEGATIVE mg/dL
Hgb urine dipstick: NEGATIVE
Ketones, ur: NEGATIVE mg/dL
Protein, ur: NEGATIVE mg/dL
pH: 7 (ref 5.0–8.0)

## 2012-03-24 LAB — URINE MICROSCOPIC-ADD ON

## 2012-03-24 MED ORDER — ONDANSETRON 8 MG PO TBDP: 8.0000 mg | ORAL_TABLET | Freq: Three times a day (TID) | ORAL | Status: AC | PRN

## 2012-03-24 MED ORDER — OXYCODONE-ACETAMINOPHEN 5-325 MG PO TABS: 1.0000 | ORAL_TABLET | ORAL | Status: AC | PRN

## 2012-03-24 NOTE — Discharge Instructions (Signed)
Your x-ray and CT did not show evidence of a fracture. Stay off of it as much as able. Take tylenol for pain. Take percocet for severe pain, take it after eating with zofran to prevent getting sick. Follow up with your doctor and your orthopedics specialist as soon as able.   Hip Pain The hips join the upper legs to the lower pelvis. The bones, cartilage, tendons, and muscles of the hip joint perform a lot of work each day holding your body weight and allowing you to move around. Hip pain is a common symptom. It can range from a minor ache to severe pain on 1 or both hips. Pain may be felt on the inside of the hip joint near the groin, or the outside near the buttocks and upper thigh. There may be swelling or stiffness as well. It occurs more often when a person walks or performs activity. There are many reasons hip pain can develop. CAUSES  It is important to work with your caregiver to identify the cause since many conditions can impact the bones, cartilage, muscles, and tendons of the hips. Causes for hip pain include:  Broken (fractured) bones.   Separation of the thighbone from the hip socket (dislocation).   Torn cartilage of the hip joint.   Swelling (inflammation) of a tendon (tendonitis), the sac within the hip joint (bursitis), or a joint.   A weakening in the abdominal wall (hernia), affecting the nerves to the hip.   Arthritis in the hip joint or lining of the hip joint.   Pinched nerves in the back, hip, or upper thigh.   A bulging disc in the spine (herniated disc).   Rarely, bone infection or cancer.  DIAGNOSIS  The location of your hip pain will help your caregiver understand what may be causing the pain. A diagnosis is based on your medical history, your symptoms, results from your physical exam, and results from diagnostic tests. Diagnostic tests may include X-ray exams, a computerized magnetic scan (magnetic resonance imaging, MRI), or bone scan. TREATMENT  Treatment  will depend on the cause of your hip pain. Treatment may include:  Limiting activities and resting until symptoms improve.   Crutches or other walking supports (a cane or brace).   Ice, elevation, and compression.   Physical therapy or home exercises.   Shoe inserts or special shoes.   Losing weight.   Medications to reduce pain.   Undergoing surgery.  HOME CARE INSTRUCTIONS   Only take over-the-counter or prescription medicines for pain, discomfort, or fever as directed by your caregiver.   Put ice on the injured area:   Put ice in a plastic bag.   Place a towel between your skin and the bag.   Leave the ice on for 15 to 20 minutes at a time, 3 to 4 times a day.   Keep your leg raised (elevated) when possible to lessen swelling.   Avoid activities that cause pain.   Follow specific exercises as directed by your caregiver.   Sleep with a pillow between your legs on your most comfortable side.   Record how often you have hip pain, the location of the pain, and what it feels like. This information may be helpful to you and your caregiver.   Ask your caregiver about returning to work or sports and whether you should drive.   Follow up with your caregiver for further exams, therapy, or testing as directed.  SEEK MEDICAL CARE IF:   Your pain  or swelling continues or worsens after 1 week.   You are feeling unwell or have chills.   You have increasing difficulty with walking.   You have a loss of sensation or other new symptoms.   You have questions or concerns.  SEEK IMMEDIATE MEDICAL CARE IF:   You cannot put weight on the affected hip.   You have fallen.   You have a sudden increase in pain and swelling in your hip.   You have a fever.  MAKE SURE YOU:   Understand these instructions.   Will watch your condition.   Will get help right away if you are not doing well or get worse.  Document Released: 04/18/2010 Document Revised: 10/18/2011 Document  Reviewed: 04/18/2010 [facility] Patient Information 2012 [place], [place].

## 2012-03-24 NOTE — ED Provider Notes (Signed)
History     CSN: [id number]  Arrival date & time 03/24/12  7829   First MD Initiated Contact with Patient 03/24/12 1940      Chief Complaint  Patient presents with  . Hip Pain    (Consider location/radiation/quality/duration/timing/severity/associated sxs/prior treatment) Patient is a 78 y.o. female presenting with hip pain. The history is provided by the patient.  Hip Pain This is a new problem. The current episode started in the past 7 days. The problem occurs constantly. The problem has been gradually worsening. Associated symptoms include arthralgias. Pertinent negatives include no chills, fever or weakness. The symptoms are aggravated by walking, standing and bending. She has tried acetaminophen for the symptoms.  Pt states she has cancer in her bones. States broke her left hip due to having cancer in it, had to have partial replacement done few months ago. States right hip now hurting, and has been for few months, however in last few days pain worsened. Today unable to walk on it. States she is allergic to all pain medications other then tylenol. States today, tylenol not helping. Deneis new injuries. Denies fever, chills, malaise. No urinary symptoms.   Past Medical History  Diagnosis Date  . Diabetes mellitus   . Hypertension   . Arthritis   . MVP (mitral valve prolapse)   . Hx of breast cancer     RIGHT BREAST  . Hypothyroidism     Past Surgical History  Procedure Date  . Hemiarthroplasty hip   . Knee arthroscopy     RIGHT KNEE  . Total abdominal hysterectomy w/ bilateral salpingoophorectomy   . Pelvic and para-aortic lymph node dissection   . Tonsillectomy   . Appendectomy   . Cholecystectomy   . US echocardiography 03/30/2009    EF 55-60%  . Cardiovascular stress test 01/28/2008    EF 74%    Family History  Problem Relation Age of Onset  . Hypertension Mother   . Heart attack Father   . Hypertension Father   . Diabetes Sister   . Hypertension Sister   .  Liver cancer Sister   . Heart attack Brother   . Hypertension Brother   . Diabetes Brother   . Diabetes Sister   . Hypertension Sister     History  Substance Use Topics  . Smoking status: Never Smoker   . Smokeless tobacco: Not on file  . Alcohol Use: No    OB History    Grav Para Term Preterm Abortions TAB SAB Ect Mult Living                  Review of Systems  Constitutional: Negative for fever and chills.  Respiratory: Negative.   Cardiovascular: Negative.   Gastrointestinal: Negative.   Musculoskeletal: Positive for back pain and arthralgias.  Skin: Negative.   Neurological: Negative for dizziness and weakness.    Allergies  Codeine; Demerol; Librium; Lipitor; and Metoprolol  Home Medications   Current Outpatient Rx  Name Route Sig Dispense Refill  . AMLODIPINE BESYLATE-VALSARTAN 10-320 MG PO TABS Oral Take 1 tablet by mouth daily. 30 tablet 11  . ANASTROZOLE 1 MG PO TABS  TAKE 1 TABLET BY MOUTH EVERY DAY 30 tablet 12  . ATENOLOL 25 MG PO TABS Oral Take 1 tablet (25 mg total) by mouth daily. 30 tablet 11  . CALCIUM + D PO Oral Take by mouth daily.      Marland Kitchen VITAMIN B-12 PO Oral Take by mouth daily.      Marland Kitchen  FLUTICASONE PROPIONATE 50 MCG/ACT NA SUSP Nasal Place 2 sprays into the nose daily.     . FUROSEMIDE 40 MG PO TABS Oral Take 40 mg by mouth as needed. For excess fluid retention    . GLIMEPIRIDE 2 MG PO TABS Oral Take 2 mg by mouth daily before breakfast.      . LEVOTHYROXINE SODIUM 75 MCG PO TABS Oral Take 75 mcg by mouth daily.      Marland Kitchen METFORMIN HCL 500 MG PO TABS Oral Take 500 mg by mouth 2 (two) times daily with a meal.      . PANTOPRAZOLE SODIUM 40 MG PO TBEC Oral Take 40 mg by mouth as needed.      Marland Kitchen POTASSIUM CHLORIDE CRYS ER 10 MEQ PO TBCR Oral Take 1 tablet (10 mEq total) by mouth daily. 30 tablet 11  . PROMETHAZINE HCL 25 MG PO TABS Oral Take 25 mg by mouth every 6 (six) hours as needed.     Marland Kitchen ZETIA 10 MG PO TABS Oral Take 10 mg by mouth Daily.        BP 203/63  Pulse 78  Temp(Src) 98.4 F (36.9 C) (Oral)  Resp 16  Wt 220 lb (99.791 kg)  SpO2 96%  Physical Exam  Nursing note and vitals reviewed. Constitutional: She is oriented to person, place, and time. She appears well-developed and well-nourished.  HENT:  Head: Normocephalic.  Neck: Neck supple.  Cardiovascular: Normal rate, regular rhythm and normal heart sounds.   Pulmonary/Chest: Effort normal and breath sounds normal. No respiratory distress.  Musculoskeletal:       Tender to palpation over right hip. Pain with right hip flexion. No pain with internal or external rotation. Unable to put weight on it for more then just transfer.   Neurological: She is alert and oriented to person, place, and time.  Skin: Skin is warm and dry.  Psychiatric: She has a normal mood and affect.    ED Course  Procedures (including critical care time)  Pt with hx of metastasis of breast ca to bone. Hx of pathological fx on left hip. Here with right hip progressive pain. No injury, unable to bear weight. Will get x-ray  Dg Hip Complete Right  03/24/2012  *RADIOLOGY REPORT*  Clinical Data: Right hip pain for 2 days.  No known injury. History of proximal left femoral pathologic fracture.  RIGHT HIP - COMPLETE 2+ VIEW  Comparison: Pelvic radiographs 10/08/2010 and 10/04/2010.  Findings: Mild osteopenia.  Patient is status post left hip bipolar hemiarthroplasty.  The distal end of the left femoral prosthesis is not imaged.  There is no evidence of acute fracture or dislocation. There is no evidence of right femoral head avascular necrosis.  The sacroiliac joints are normal.  Lumbar spine degenerative changes are present inferiorly.  IMPRESSION: No acute osseous findings.  No evidence of metastatic disease or pathologic fracture.  Original Report Authenticated By: Gerrianne Scale, M.D.   X-ray negative, will get CT for closer look, since still unable to bear weight. Pt afebrile, non toxic, doubt  infection. No injuries. Pt refusing pain medicaitons.    Ct Hip Right Wo Contrast  03/24/2012  *RADIOLOGY REPORT*  Clinical Data: Right hip pain.  CT OF THE RIGHT HIP WITHOUT CONTRAST  Technique:  Multidetector CT imaging was performed according to the standard protocol. Multiplanar CT image reconstructions were also generated.  Comparison: 03/24/2012  Findings: Technical factors related to patient body habitus reduce diagnostic sensitivity and specificity.  Streak artifact  from the patient's left hip implant reduces signal to noise ratio.  Sigmoid diverticulosis noted without active diverticulitis. Degenerative acetabular spurring is observed.  No hip effusion is identified.  Slight ridging noted along the anterior femoral neck without a discrete fracture identified.  IMPRESSION:  1.  No overt cortical discontinuity is identified to favor a fracture.  If there is a high index of suspicion of occult fracture, MRI offers the greatest diagnostic sensitivity and specificity. 2.  Sigmoid diverticulosis.  Original Report Authenticated By: Dellia Cloud, M.D.    CT negative. Pt seen by Dr. Weldon Inches as well. Will d/c home with pain management and follow up with her surgeon and PCP. BP elevated, instructed to follow up with her doctor for recheck.   1. Right hip pain       MDM         Lottie Mussel, PA 03/25/12 0105

## 2012-03-24 NOTE — ED Notes (Signed)
Pt was in the waiting room in 10/10 hip and back pain. Pt became very tearful pt was placed in minor until bed comes open. Charge nurse notified

## 2012-03-24 NOTE — ED Provider Notes (Signed)
Medical screening examination/treatment/procedure(s) were conducted as a shared visit with non-physician practitioner(s) and myself.  I personally evaluated the patient during the encounter Hx pathologic left hip fx and surg.  C/o right hip pain now. Was so severe she could not walk. Pain free now.  No fx on xr or ct.   Wants to go home.  Will release with narcotic pain meds.  Cheri Guppy, MD 03/24/12 2230

## 2012-03-25 NOTE — ED Provider Notes (Signed)
Medical screening examination/treatment/procedure(s) were performed by non-physician practitioner and as supervising physician I was immediately available for consultation/collaboration.  Dewell Monnier, MD 03/25/12 1838 

## 2012-03-27 LAB — URINE CULTURE
Colony Count: >=100000
Culture  Setup Time: 201305140357

## 2012-03-28 NOTE — ED Notes (Signed)
+   URINE Chart sent to EDP office for review. 

## 2012-04-02 ENCOUNTER — Other Ambulatory Visit: Payer: Self-pay | Admitting: Cardiology

## 2012-04-02 NOTE — Telephone Encounter (Signed)
Refilled atenolol

## 2012-04-02 NOTE — ED Notes (Signed)
Patient treated with keflex and Amox already

## 2012-04-04 ENCOUNTER — Telehealth: Payer: Self-pay | Admitting: Oncology

## 2012-04-04 NOTE — Telephone Encounter (Signed)
called pt and r/s appt on 06/27 to 07/08.  d/t per pt

## 2012-04-15 ENCOUNTER — Ambulatory Visit: Payer: PRIVATE HEALTH INSURANCE | Admitting: Oncology

## 2012-04-15 ENCOUNTER — Other Ambulatory Visit: Payer: PRIVATE HEALTH INSURANCE | Admitting: Lab

## 2012-05-08 ENCOUNTER — Other Ambulatory Visit: Payer: PRIVATE HEALTH INSURANCE | Admitting: Lab

## 2012-05-08 ENCOUNTER — Ambulatory Visit: Payer: PRIVATE HEALTH INSURANCE | Admitting: Oncology

## 2012-05-13 ENCOUNTER — Other Ambulatory Visit: Payer: PRIVATE HEALTH INSURANCE | Admitting: Lab

## 2012-05-13 ENCOUNTER — Ambulatory Visit: Payer: PRIVATE HEALTH INSURANCE | Admitting: Oncology

## 2012-05-19 ENCOUNTER — Ambulatory Visit: Payer: PRIVATE HEALTH INSURANCE | Admitting: Oncology

## 2012-05-19 ENCOUNTER — Other Ambulatory Visit: Payer: PRIVATE HEALTH INSURANCE | Admitting: Lab

## 2012-06-03 ENCOUNTER — Other Ambulatory Visit (HOSPITAL_COMMUNITY)
Admission: RE | Admit: 2012-06-03 | Discharge: 2012-06-03 | Disposition: A | Discharge status: Home / Self Care | Payer: PRIVATE HEALTH INSURANCE | Source: Ambulatory Visit | Attending: Oncology | Admitting: Oncology

## 2012-06-03 ENCOUNTER — Telehealth: Payer: Self-pay | Admitting: Oncology

## 2012-06-03 ENCOUNTER — Other Ambulatory Visit (HOSPITAL_BASED_OUTPATIENT_CLINIC_OR_DEPARTMENT_OTHER): Payer: PRIVATE HEALTH INSURANCE | Admitting: Lab

## 2012-06-03 ENCOUNTER — Ambulatory Visit (HOSPITAL_BASED_OUTPATIENT_CLINIC_OR_DEPARTMENT_OTHER): Payer: PRIVATE HEALTH INSURANCE | Admitting: Oncology

## 2012-06-03 VITALS — BP 153/75 | HR 82 | Temp 98.5°F | Ht 65.0 in | Wt 209.5 lb

## 2012-06-03 DIAGNOSIS — C50919 Malignant neoplasm of unspecified site of unspecified female breast: Secondary | ICD-10-CM

## 2012-06-03 DIAGNOSIS — C7952 Secondary malignant neoplasm of bone marrow: Secondary | ICD-10-CM

## 2012-06-03 DIAGNOSIS — C801 Malignant (primary) neoplasm, unspecified: Secondary | ICD-10-CM

## 2012-06-03 DIAGNOSIS — D72820 Lymphocytosis (symptomatic): Secondary | ICD-10-CM | POA: Insufficient documentation

## 2012-06-03 DIAGNOSIS — C9110 Chronic lymphocytic leukemia of B-cell type not having achieved remission: Secondary | ICD-10-CM

## 2012-06-03 DIAGNOSIS — D72829 Elevated white blood cell count, unspecified: Secondary | ICD-10-CM | POA: Insufficient documentation

## 2012-06-03 LAB — CBC WITH DIFFERENTIAL/PLATELET
Basophils Absolute: 0.1 10*3/uL (ref 0.0–0.1)
Eosinophils Absolute: 0.2 10*3/uL (ref 0.0–0.5)
HCT: 39.1 % (ref 34.8–46.6)
HGB: 12.4 g/dL (ref 11.6–15.9)
MCV: 85 fL (ref 79.5–101.0)
MONO%: 4.1 % (ref 0.0–14.0)
NEUT#: 10.4 10*3/uL — ABNORMAL HIGH (ref 1.5–6.5)
NEUT%: 57.3 % (ref 38.4–76.8)
RDW: 14.5 % (ref 11.2–14.5)

## 2012-06-03 NOTE — Progress Notes (Signed)
   [facility]    OFFICE PROGRESS NOTE   INTERVAL HISTORY:   She returns as scheduled. She continues arimidex. No hot flashes or arthralgias.  When she was here in March the white count was elevated. Review of the peripheral blood smear was consistent with a diagnosis of CLL. I discussed this with her by telephone. She denies fever. She reports an intentional 10 pound weight loss.  Objective:  Vital signs in last 24 hours:  Blood pressure 153/75, pulse 82, temperature 98.5 F (36.9 C), temperature source Oral, height 5\' 5"  (1.651 m), weight 209 lb 8 oz (95.029 kg).    HEENT: Neck without mass Lymphatics: No cervical, supraclavicular, axillary, or inguinal nodes Resp: Lungs clear bilaterally Cardio: Regular rate and rhythm GI: No hepatosplenomegaly Vascular: No leg edema Breasts: Status post right lumpectomy. Edema and faint erythema over the right breast. No mass.      Lab Results:  Lab Results  Component Value Date   WBC 18.1* 06/03/2012   HGB 12.4 06/03/2012   HCT 39.1 06/03/2012   MCV 85.0 06/03/2012   PLT 288 06/03/2012   absolute lymphocyte count 6.7  CA 27.29 on 02/01/2012-18  Medications: I have reviewed the patient's current medications.  Assessment/Plan: 1. Breast cancer, right-sided breast cancer (T2 N2) diagnosed in 2009, status post a right lumpectomy and adjuvant radiation. She was diagnosed with a pathologic left femur fracture in November 2011. A staging bone scan and CT scan in November 2011 confirmed multiple bone metastases and a cystic metastasis at the left iliac muscle. No visceral metastases were seen. She began Arimidex following an office visit 11/07/2010. The Arimidex was held for approximately 1 week after an office visit on 02/09/2011 and then resumed on 02/16/2011.  a. The elevated CA 27.29 was improved on 02/09/2011, normal 02/01/2012 2. Status post a left hip hemiarthroplasty for treatment of a pathologic fracture  10/07/2010. 3. Status post radiation to the left proximal femur 12/22 through 11/17/2010. 4. Diabetes. 5. Hypertension. 6. History of arthralgias, likely related to degenerative arthritis 7. Leukocytosis/lymphocytosis-chronic. Review the peripheral blood smear from 02/01/2012 was consistent with a diagnosis of chronic lymphocytic leukemia. A peripheral blood sample was submitted for flow cytometry today.   Disposition:  She will continue arimidex for the metastatic breast cancer. No clinical evidence for disease progression.  She appears to have early stage CLL. No indication for treatment at present. She understands the increased risk for infections in patients with CLL. She will stay up-to-date on the importance of vaccine. We will administer a a pneumococcal vaccine when she returns in 4 months.  Ms. Grist will return for an office and lab visit in 4 months.   Thornton Papas, MD  06/03/2012  5:36 PM

## 2012-06-03 NOTE — Telephone Encounter (Signed)
Gave pt appt for 09/22/12, date and timer per patient request, lab and MD

## 2012-06-05 LAB — FLOW CYTOMETRY

## 2012-06-06 ENCOUNTER — Telehealth: Payer: Self-pay | Admitting: Medical Oncology

## 2012-06-06 NOTE — Telephone Encounter (Signed)
Pt notified that CA 27.29 is normal and flow cytometry confirms diagnosis of CLL and f/u as scheduled per Dr Kalman Drape note. She asked questions about treatment and I referred her back to Dr Truett Perna for this discussion. She said she has been "battling a bladder infection"  and I told her to follow up with her primary care. She said she would. She also told me she is on Keflex because she had a tooth pulled on Wednesday.

## 2012-06-24 ENCOUNTER — Telehealth: Payer: Self-pay | Admitting: *Deleted

## 2012-06-24 NOTE — Telephone Encounter (Signed)
Patient left VM requesting MD call her to discuss her lab results in more detail and has questions about treatment options.

## 2012-08-08 ENCOUNTER — Ambulatory Visit: Payer: Medicare Other | Admitting: Oncology

## 2012-08-08 ENCOUNTER — Other Ambulatory Visit: Payer: Medicare Other | Admitting: Lab

## 2012-08-25 ENCOUNTER — Ambulatory Visit (INDEPENDENT_AMBULATORY_CARE_PROVIDER_SITE_OTHER): Payer: PRIVATE HEALTH INSURANCE | Admitting: Cardiology

## 2012-08-25 ENCOUNTER — Encounter: Payer: Self-pay | Admitting: Cardiology

## 2012-08-25 VITALS — BP 128/68 | HR 57 | Ht 65.0 in | Wt 205.8 lb

## 2012-08-25 DIAGNOSIS — C7951 Secondary malignant neoplasm of bone: Secondary | ICD-10-CM

## 2012-08-25 DIAGNOSIS — I119 Hypertensive heart disease without heart failure: Secondary | ICD-10-CM

## 2012-08-25 DIAGNOSIS — C50919 Malignant neoplasm of unspecified site of unspecified female breast: Secondary | ICD-10-CM

## 2012-08-25 DIAGNOSIS — E7800 Pure hypercholesterolemia, unspecified: Secondary | ICD-10-CM

## 2012-08-25 NOTE — Patient Instructions (Addendum)
Your physician recommends that you continue on your current medications as directed. Please refer to the Current Medication list given to you today.  Your physician wants you to follow-up in: 6 months. You will receive a reminder letter in the mail two months in advance. If you don't receive a letter, please call our office to schedule the follow-up appointment.  

## 2012-08-25 NOTE — Assessment & Plan Note (Signed)
Patient has a past history of breast cancer of the right breast with a subsequent metastasis to the left hip requiring left hip surgery for a pathologic fracture.  Since then there has been no evidence of any further bony metastases.  The patient states that Dr.Sherrill has also told her that he has found some evidence of CLL not requiring any treatment at this time

## 2012-08-25 NOTE — Progress Notes (Signed)
Molly [name] Date of Birth:  [birth year] [facility] [address] [place], [place]  [address] [phone]         Fax   [phone]  History of Present Illness: This 78 year old woman is seen for a scheduled followup office visit. He has a past history of essential hypertension and hypercholesterolemia. She also has a history of aortic valve sclerosis. She has had a history of arrhythmia with PVCs. She has a history of breast cancer with metastasis to bone and she had a previous pathologic fracture of her left hip. He does not have any history of ischemic heart disease and she had a normal nuclear stress test in 2009.  She was told many years ago by her former PCP Dr. Lendell Caprice that she had a small abdominal aortic aneurysm.  She has a history of diabetes and is followed by Dr. Sharl Ma.   Current Outpatient Prescriptions  Medication Sig Dispense Refill  . amLODipine-valsartan (EXFORGE) 10-320 MG per tablet Take 1 tablet by mouth daily.  30 tablet  11  . anastrozole (ARIMIDEX) 1 MG tablet TAKE 1 TABLET BY MOUTH EVERY DAY  30 tablet  12  . atenolol (TENORMIN) 25 MG tablet TAKE 1 TABLET BY MOUTH EVERY DAY  30 tablet  11  . Calcium Carbonate-Vitamin D (CALCIUM + D PO) Take by mouth daily.        . Cyanocobalamin (VITAMIN B-12 PO) Take by mouth daily.        . fluticasone (FLONASE) 50 MCG/ACT nasal spray Place 2 sprays into the nose daily.       . furosemide (LASIX) 40 MG tablet Take 40 mg by mouth as needed. For excess fluid retention      . glimepiride (AMARYL) 2 MG tablet Take 2 mg by mouth daily before breakfast.        . levothyroxine (SYNTHROID, LEVOTHROID) 75 MCG tablet Take 75 mcg by mouth daily.        . metFORMIN (GLUCOPHAGE) 500 MG tablet Take 500 mg by mouth 2 (two) times daily with a meal.        . pantoprazole (PROTONIX) 40 MG tablet Take 40 mg by mouth as needed.        . potassium chloride (KLOR-CON M10) 10 MEQ tablet Take 1 tablet (10 mEq total) by mouth  daily.  30 tablet  11  . promethazine (PHENERGAN) 25 MG tablet Take 25 mg by mouth every 6 (six) hours as needed.       Marland Kitchen ZETIA 10 MG tablet Take 10 mg by mouth Daily.         Allergies  Allergen Reactions  . Codeine   . Demerol   . Librium   . Lipitor (Atorvastatin Calcium)   . Metoprolol     Patient Active Problem List  Diagnosis  . Benign hypertensive heart disease without heart failure  . Pure hypercholesterolemia  . Asymptomatic PVCs  . Breast cancer metastasized to bone    History  Smoking status  . Never Smoker   Smokeless tobacco  . Not on file    History  Alcohol Use No    Family History  Problem Relation Age of Onset  . Hypertension Mother   . Heart attack Father   . Hypertension Father   . Diabetes Sister   . Hypertension Sister   . Liver cancer Sister   . Heart attack Brother   . Hypertension Brother   . Diabetes Brother   . Diabetes Sister   .  Hypertension Sister     Review of Systems: Constitutional: no fever chills diaphoresis or fatigue or change in weight.  Head and neck: no hearing loss, no epistaxis, no photophobia or visual disturbance. Respiratory: No cough, shortness of breath or wheezing. Cardiovascular: No chest pain peripheral edema, palpitations. Gastrointestinal: No abdominal distention, no abdominal pain, no change in bowel habits hematochezia or melena. Genitourinary: No dysuria, no frequency, no urgency, no nocturia. Musculoskeletal:No arthralgias, no back pain, no gait disturbance or myalgias. Neurological: No dizziness, no headaches, no numbness, no seizures, no syncope, no weakness, no tremors. Hematologic: No lymphadenopathy, no easy bruising. Psychiatric: No confusion, no hallucinations, no sleep disturbance.    Physical Exam: Filed Vitals:   08/25/12 1448  BP: 128/68  Pulse: 57   the general appearance reveals a well-developed well-nourished elderly woman in no distress.  She came by wheelchair today but was able  to be assisted up onto the exam table with two-person assistance.The head and neck exam reveals pupils equal and reactive.  Extraocular movements are full.  There is no scleral icterus.  The mouth and pharynx are normal.  The neck is supple.  The carotids reveal no bruits.  The jugular venous pressure is normal.  The  thyroid is not enlarged.  There is no lymphadenopathy.  The chest is clear to percussion and auscultation.  There are no rales or rhonchi.  Expansion of the chest is symmetrical.  The precordium is quiet.  The first heart sound is normal.  The second heart sound is physiologically split.  There is no murmur gallop rub or click.  There is no abnormal lift or heave.  The abdomen is soft and nontender.  The bowel sounds are normal.  The liver and spleen are not enlarged.  There are no abdominal masses.  There are no abdominal bruits.  Extremities reveal good pedal pulses.  There is no phlebitis or edema.  There is no cyanosis or clubbing.  Strength is normal and symmetrical in all extremities.  There is no lateralizing weakness.  There are no sensory deficits.  The skin is warm and dry.  There is no rash.     Assessment / Plan: Continue on same medication.  Recheck in 6 months for followup office visit and EKG

## 2012-08-25 NOTE — Assessment & Plan Note (Signed)
Patient has a history of hypercholesterolemia.  She has been more careful with her diet and her weight is down 15 pounds since last visit.  She is intolerant of statins but is on ezetimibe

## 2012-08-25 NOTE — Assessment & Plan Note (Signed)
The patient has a history of exertional dyspnea if she gets in a hurry.  She is relatively sedentary.  Today she came by wheelchair although at home she uses a walker or a cane.  She does complain of lack of energy

## 2012-09-22 ENCOUNTER — Other Ambulatory Visit (HOSPITAL_BASED_OUTPATIENT_CLINIC_OR_DEPARTMENT_OTHER): Payer: PRIVATE HEALTH INSURANCE | Admitting: Lab

## 2012-09-22 ENCOUNTER — Telehealth: Payer: Self-pay | Admitting: Oncology

## 2012-09-22 ENCOUNTER — Ambulatory Visit (HOSPITAL_BASED_OUTPATIENT_CLINIC_OR_DEPARTMENT_OTHER): Payer: PRIVATE HEALTH INSURANCE | Admitting: Oncology

## 2012-09-22 VITALS — BP 150/71 | HR 74 | Temp 99.0°F | Resp 18 | Ht 65.0 in | Wt 207.8 lb

## 2012-09-22 DIAGNOSIS — C50919 Malignant neoplasm of unspecified site of unspecified female breast: Secondary | ICD-10-CM

## 2012-09-22 DIAGNOSIS — C50419 Malignant neoplasm of upper-outer quadrant of unspecified female breast: Secondary | ICD-10-CM

## 2012-09-22 DIAGNOSIS — C9110 Chronic lymphocytic leukemia of B-cell type not having achieved remission: Secondary | ICD-10-CM

## 2012-09-22 DIAGNOSIS — C7951 Secondary malignant neoplasm of bone: Secondary | ICD-10-CM

## 2012-09-22 LAB — CBC WITH DIFFERENTIAL/PLATELET
BASO%: 0.2 % (ref 0.0–2.0)
Basophils Absolute: 0.1 10*3/uL (ref 0.0–0.1)
EOS%: 1 % (ref 0.0–7.0)
HCT: 37.5 % (ref 34.8–46.6)
HGB: 12.3 g/dL (ref 11.6–15.9)
LYMPH%: 37.9 % (ref 14.0–49.7)
MCH: 27.7 pg (ref 25.1–34.0)
MCHC: 32.8 g/dL (ref 31.5–36.0)
MCV: 84.5 fL (ref 79.5–101.0)
MONO%: 5.5 % (ref 0.0–14.0)
NEUT%: 55.4 % (ref 38.4–76.8)

## 2012-09-22 MED ORDER — CIPROFLOXACIN HCL 250 MG PO TABS: 250.0000 mg | ORAL_TABLET | Freq: Every day | ORAL | Status: DC | PRN

## 2012-09-22 NOTE — Addendum Note (Signed)
Addended by: Wandalee Ferdinand on: 09/22/2012 04:06 PM   Modules accepted: Orders

## 2012-09-22 NOTE — Progress Notes (Signed)
Refuses flu vaccine and pneumococcal vaccine.

## 2012-09-22 NOTE — Telephone Encounter (Signed)
appts made and printed for pt ok per dr Truett Perna for the pt to see lisa since they need a late Friday    aom

## 2012-09-22 NOTE — Progress Notes (Signed)
    [facility]    OFFICE PROGRESS NOTE   INTERVAL HISTORY:   She returns as scheduled. She continues Arimidex. Chronic discomfort at the "hip". No hot flashes or new arthralgias. No recent infection aside from "bladder "pain. She has intermittent episodes of bladder pain, relieved with ciprofloxacin. She takes 1 or 2 ciprofloxacin doses and the pain resolves.  Objective:  Vital signs in last 24 hours:  Blood pressure 150/71, pulse 74, temperature 99 F (37.2 C), temperature source Oral, resp. rate 18, height 5\' 5"  (1.651 m), weight 207 lb 12.8 oz (94.257 kg).    HEENT: Neck without mass Lymphatics: No cervical, supraclavicular, or axillary nodes Resp: Lungs clear bilaterally Cardio: Regular rate and rhythm GI: No hepatosplenomegaly Vascular: Trace edema at the left greater than right lower leg Breasts: Status post right lumpectomy. No mass in either breast.    Portacath/PICC-without erythema  Lab Results:  Lab Results  Component Value Date   WBC 20.4* 09/22/2012   HGB 12.3 09/22/2012   HCT 37.5 09/22/2012   MCV 84.5 09/22/2012   PLT 308 09/22/2012   ANC 11.3, absolute lymphocyte count 7.7   Medications: I have reviewed the patient's current medications.  Assessment/Plan: 1. Breast cancer, right-sided breast cancer (T2 N2) diagnosed in 2009, status post a right lumpectomy and adjuvant radiation. She was diagnosed with a pathologic left femur fracture in November 2011. A staging bone scan and CT scan in November 2011 confirmed multiple bone metastases and a cystic metastasis at the left iliac muscle. No visceral metastases were seen. She began Arimidex following an office visit 11/07/2010. The Arimidex was held for approximately 1 week after an office visit on 02/09/2011 and then resumed on 02/16/2011.  a. The elevated CA 27.29 was improved on 02/09/2011, normal 02/01/2012 and normal on 06/03/2012 to 2. Status post a left hip hemiarthroplasty for treatment  of a pathologic fracture 10/07/2010. 3. Status post radiation to the left proximal femur 12/22 through 11/17/2010. 4. Diabetes. 5. Hypertension. 6. History of arthralgias, likely related to degenerative arthritis 7. Leukocytosis/lymphocytosis-chronic. Review the peripheral blood smear from 02/01/2012 was consistent with a diagnosis of chronic lymphocytic leukemia. A peripheral blood sample was submitted for flow cytometry today on July 20 13,013. This confirmed a diagnosis of chronic lymphatic leukemia. 8. Recurrent "bladder "pain-relieved with ciprofloxacin, it is not clear the pain is related to urinary tract infection, but she insists the pain is relieved with ciprofloxacin.  Disposition:  She continues Arimidex. No clinical evidence for progression of the breast cancer. She appears to have early stage CLL. Ms. Egeland understands the increased risk of infections in patients with CLL. She declined an influenza and pneumococcal vaccine today.  She will continue Arimidex. Ms. Serven will return for an office and lab visit in 6 months. She will contact us in the interim for new symptoms.   Thornton Papas, MD  09/22/2012  3:43 PM

## 2012-09-24 ENCOUNTER — Telehealth: Payer: Self-pay | Admitting: *Deleted

## 2012-09-24 NOTE — Telephone Encounter (Signed)
Notified of CA 27.29 results normal. Request copy of labs be mailed to her home.

## 2012-09-24 NOTE — Telephone Encounter (Signed)
Message copied by Wandalee Ferdinand on Wed Sep 24, 2012 12:44 PM ------      Message from: Ladene Artist      Created: Tue Sep 23, 2012  8:26 PM       Please call patient, ca27.29 is normal

## 2012-10-10 ENCOUNTER — Other Ambulatory Visit: Payer: Self-pay | Admitting: Oncology

## 2012-10-28 ENCOUNTER — Other Ambulatory Visit: Payer: Self-pay | Admitting: Cardiology

## 2012-10-28 MED ORDER — AMLODIPINE BESYLATE-VALSARTAN 10-320 MG PO TABS: 1.0000 | ORAL_TABLET | Freq: Every day | ORAL | Status: DC

## 2012-11-21 ENCOUNTER — Other Ambulatory Visit: Payer: Self-pay | Admitting: Oncology

## 2013-01-12 ENCOUNTER — Other Ambulatory Visit: Payer: Self-pay | Admitting: Family Medicine

## 2013-01-12 DIAGNOSIS — Z853 Personal history of malignant neoplasm of breast: Secondary | ICD-10-CM

## 2013-02-16 ENCOUNTER — Encounter: Payer: Self-pay | Admitting: Cardiology

## 2013-02-16 ENCOUNTER — Ambulatory Visit (INDEPENDENT_AMBULATORY_CARE_PROVIDER_SITE_OTHER): Payer: PRIVATE HEALTH INSURANCE | Admitting: Cardiology

## 2013-02-16 VITALS — BP 150/78 | HR 74 | Ht 65.5 in | Wt 211.4 lb

## 2013-02-16 DIAGNOSIS — I4510 Unspecified right bundle-branch block: Secondary | ICD-10-CM

## 2013-02-16 DIAGNOSIS — I4949 Other premature depolarization: Secondary | ICD-10-CM

## 2013-02-16 DIAGNOSIS — I119 Hypertensive heart disease without heart failure: Secondary | ICD-10-CM

## 2013-02-16 DIAGNOSIS — I493 Ventricular premature depolarization: Secondary | ICD-10-CM

## 2013-02-16 NOTE — Assessment & Plan Note (Signed)
The patient has adult onset diabetes.  She has not had any hypoglycemic episodes.  Her weight is up 6 pounds since last visit and she has not been on a careful diet.  She drinks regular cola Drinks

## 2013-02-16 NOTE — Assessment & Plan Note (Signed)
The patient has a history of high blood pressure.  She has not been expressing any chest pain.  She does have exertional dyspnea which is chronic and has not worsened since last visit.  She is not having any dizzy spells or syncope.

## 2013-02-16 NOTE — Patient Instructions (Addendum)
Work harder on [employer] loss   Your physician recommends that you continue on your current medications as directed. Please refer to the Current Medication list given to you today.  Your physician wants you to follow-up in: 6 month You will receive a reminder letter in the mail two months in advance. If you don't receive a letter, please call our office to schedule the follow-up appointment.

## 2013-02-16 NOTE — Assessment & Plan Note (Signed)
The patient has a past history of PVCs.  She has not been aware of any recent racing of her heart or palpitations.  She does drink regular Dr. Alcus Dad and other soft drinks.

## 2013-02-16 NOTE — Progress Notes (Signed)
Lanae Crumbly Date of Birth:  1923-07-19 [facility] [address] [place], [place]  [address] [phone]         Fax   [phone]  History of Present Illness: This 79 year old woman is seen for a scheduled followup office visit. He has a past history of essential hypertension and hypercholesterolemia. She also has a history of aortic valve sclerosis. She has had a history of arrhythmia with PVCs. She has a history of breast cancer with metastasis to bone and she had a previous pathologic fracture of her left hip. He does not have any history of ischemic heart disease and she had a normal nuclear stress test in 2009. She was told many years ago by her former PCP Dr. Lendell Caprice that she had a small abdominal aortic aneurysm. She has a history of diabetes and is followed by Dr. Sharl Ma. Since last visit she has not had any new cardiac symptoms. Current Outpatient Prescriptions  Medication Sig Dispense Refill  . amLODipine-valsartan (EXFORGE) 10-320 MG per tablet Take 1 tablet by mouth daily.  30 tablet  11  . anastrozole (ARIMIDEX) 1 MG tablet TAKE 1 TABLET BY MOUTH EVERY DAY  30 tablet  6  . atenolol (TENORMIN) 25 MG tablet TAKE 1 TABLET BY MOUTH EVERY DAY  30 tablet  11  . Calcium Carbonate-Vitamin D (CALCIUM + D PO) Take by mouth daily.        . ciprofloxacin (CIPRO) 250 MG tablet Take 1 tablet (250 mg total) by mouth daily as needed.  20 tablet  0  . Cyanocobalamin (VITAMIN B-12 PO) Take by mouth daily.        . fluticasone (FLONASE) 50 MCG/ACT nasal spray Place 2 sprays into the nose daily as needed.       . furosemide (LASIX) 40 MG tablet Take 40 mg by mouth as needed. For excess fluid retention      . glimepiride (AMARYL) 2 MG tablet Take 2 mg by mouth daily before breakfast.        . levothyroxine (SYNTHROID, LEVOTHROID) 75 MCG tablet Take 75 mcg by mouth daily.        . metFORMIN (GLUCOPHAGE) 500 MG tablet Take 500 mg by mouth 2 (two) times daily with a meal.         . pantoprazole (PROTONIX) 40 MG tablet Take 40 mg by mouth as needed.        . potassium chloride (KLOR-CON M10) 10 MEQ tablet Take 1 tablet (10 mEq total) by mouth daily.  30 tablet  11  . ZETIA 10 MG tablet Take 10 mg by mouth Daily.        No current facility-administered medications for this visit.    Allergies  Allergen Reactions  . Codeine   . Demerol   . Librium   . Lipitor (Atorvastatin Calcium)   . Metoprolol     Patient Active Problem List  Diagnosis  . Benign hypertensive heart disease without heart failure  . Pure hypercholesterolemia  . Asymptomatic PVCs  . Breast cancer metastasized to bone  . RBBB    History  Smoking status  . Never Smoker   Smokeless tobacco  . Not on file    History  Alcohol Use No    Family History  Problem Relation Age of Onset  . Hypertension Mother   . Heart attack Father   . Hypertension Father   . Diabetes Sister   . Hypertension Sister   . Liver cancer Sister   .  Heart attack Brother   . Hypertension Brother   . Diabetes Brother   . Diabetes Sister   . Hypertension Sister     Review of Systems: Constitutional: no fever chills diaphoresis or fatigue or change in weight.  Head and neck: no hearing loss, no epistaxis, no photophobia or visual disturbance. Respiratory: No cough, shortness of breath or wheezing. Cardiovascular: No chest pain peripheral edema, palpitations. Gastrointestinal: No abdominal distention, no abdominal pain, no change in bowel habits hematochezia or melena. Genitourinary: No dysuria, no frequency, no urgency, no nocturia. Musculoskeletal:No arthralgias, no back pain, no gait disturbance or myalgias. Neurological: No dizziness, no headaches, no numbness, no seizures, no syncope, no weakness, no tremors. Hematologic: No lymphadenopathy, no easy bruising. Psychiatric: No confusion, no hallucinations, no sleep disturbance.    Physical Exam: Filed Vitals:   02/16/13 1425  BP: 150/78    Pulse: 74   the general appearance reveals an elderly woman in no acute distress.The head and neck exam reveals pupils equal and reactive.  Extraocular movements are full.  There is no scleral icterus.  The mouth and pharynx are normal.  The neck is supple.  The carotids reveal no bruits.  The jugular venous pressure is normal.  The  thyroid is not enlarged.  There is no lymphadenopathy.  The chest is clear to percussion and auscultation.  There are no rales or rhonchi.  Expansion of the chest is symmetrical.  The precordium is quiet.  The first heart sound is normal.  The second heart sound is physiologically split.  There is no murmur gallop rub or click.  There is no abnormal lift or heave.  The abdomen is soft and nontender.  The bowel sounds are normal.  The liver and spleen are not enlarged.  There are no abdominal masses.  There are no abdominal bruits.  Extremities reveal good pedal pulses.  There is trace left ankle edema.  There is no cyanosis or clubbing.  Strength is normal and symmetrical in all extremities.  There is no lateralizing weakness.  There are no sensory deficits.  The skin is warm and dry.  There is no rash.     Assessment / Plan: The patient needs to work harder on her diet and to lose weight.  Continue same medication.  Recheck in 6 months for followup office visit.

## 2013-02-18 ENCOUNTER — Encounter: Payer: Self-pay | Admitting: Oncology

## 2013-02-27 ENCOUNTER — Ambulatory Visit
Admission: RE | Admit: 2013-02-27 | Discharge: 2013-02-27 | Disposition: A | Discharge status: Home / Self Care | Payer: PRIVATE HEALTH INSURANCE | Source: Ambulatory Visit | Attending: Family Medicine | Admitting: Family Medicine

## 2013-02-27 DIAGNOSIS — Z853 Personal history of malignant neoplasm of breast: Secondary | ICD-10-CM

## 2013-02-27 DIAGNOSIS — Z9889 Other specified postprocedural states: Secondary | ICD-10-CM

## 2013-03-16 ENCOUNTER — Other Ambulatory Visit: Payer: Self-pay | Admitting: *Deleted

## 2013-03-16 MED ORDER — ATENOLOL 25 MG PO TABS: 25.0000 mg | ORAL_TABLET | Freq: Every day | ORAL | Status: DC

## 2013-03-17 ENCOUNTER — Other Ambulatory Visit: Payer: Self-pay | Admitting: *Deleted

## 2013-03-17 MED ORDER — ATENOLOL 25 MG PO TABS: 25.0000 mg | ORAL_TABLET | Freq: Every day | ORAL | Status: DC

## 2013-03-20 ENCOUNTER — Ambulatory Visit (HOSPITAL_BASED_OUTPATIENT_CLINIC_OR_DEPARTMENT_OTHER): Payer: PRIVATE HEALTH INSURANCE | Admitting: Nurse Practitioner

## 2013-03-20 ENCOUNTER — Telehealth: Payer: Self-pay | Admitting: Oncology

## 2013-03-20 ENCOUNTER — Other Ambulatory Visit (HOSPITAL_BASED_OUTPATIENT_CLINIC_OR_DEPARTMENT_OTHER): Payer: PRIVATE HEALTH INSURANCE | Admitting: Lab

## 2013-03-20 VITALS — BP 179/60 | HR 73 | Temp 98.7°F | Resp 18 | Ht 65.0 in | Wt 207.6 lb

## 2013-03-20 DIAGNOSIS — C50419 Malignant neoplasm of upper-outer quadrant of unspecified female breast: Secondary | ICD-10-CM

## 2013-03-20 DIAGNOSIS — C7951 Secondary malignant neoplasm of bone: Secondary | ICD-10-CM

## 2013-03-20 DIAGNOSIS — C9110 Chronic lymphocytic leukemia of B-cell type not having achieved remission: Secondary | ICD-10-CM

## 2013-03-20 DIAGNOSIS — C50919 Malignant neoplasm of unspecified site of unspecified female breast: Secondary | ICD-10-CM

## 2013-03-20 DIAGNOSIS — Z170 Estrogen receptor positive status [ER+]: Secondary | ICD-10-CM

## 2013-03-20 LAB — CBC WITH DIFFERENTIAL/PLATELET
BASO%: 0.6 % (ref 0.0–2.0)
HCT: 36.7 % (ref 34.8–46.6)
LYMPH%: 38.7 % (ref 14.0–49.7)
MCH: 26.5 pg (ref 25.1–34.0)
MCHC: 31.7 g/dL (ref 31.5–36.0)
MCV: 83.5 fL (ref 79.5–101.0)
MONO#: 0.8 10*3/uL (ref 0.1–0.9)
MONO%: 3.7 % (ref 0.0–14.0)
NEUT%: 56 % (ref 38.4–76.8)
Platelets: 304 10*3/uL (ref 145–400)
WBC: 21.7 10*3/uL — ABNORMAL HIGH (ref 3.9–10.3)

## 2013-03-20 NOTE — Telephone Encounter (Signed)
gv relative appt schedule for November.

## 2013-03-20 NOTE — Progress Notes (Signed)
OFFICE PROGRESS NOTE  Interval history:  Molly [name] returns as scheduled. She continues Arimidex. She denies arthralgias. She has periodic night sweats. No fevers. Chronic discomfort at the left hip. No new areas of pain. Appetite varies.   Objective: Blood pressure 179/60, pulse 73, temperature 98.7 F (37.1 C), resp. rate 18, height 5\' 5"  (1.651 m), weight 207 lb 9.6 oz (94.167 kg).  No thrush or ulceration. No palpable cervical, supraclavicular or axillary lymph nodes. Status post right lumpectomy. No mass palpated in either breast. Lungs are clear. Regular cardiac rhythm. Abdomen is soft and nontender. No organomegaly. Trace edema at the lower leg/ankles left slightly greater than right.  Lab Results: Lab Results  Component Value Date   WBC 21.7* 03/20/2013   HGB 11.6 03/20/2013   HCT 36.7 03/20/2013   MCV 83.5 03/20/2013   PLT 304 03/20/2013   56% neutrophils, 38% lymphocytes Chemistry:    Chemistry      Component Value Date/Time   NA 136 02/01/2012 1550   K 3.6 02/01/2012 1550   CL 101 02/01/2012 1550   CO2 19 02/01/2012 1550   BUN 11 02/01/2012 1550   CREATININE 0.78 02/01/2012 1550      Component Value Date/Time   CALCIUM 9.1 02/01/2012 1550   ALKPHOS 93 02/01/2012 1550   AST 26 02/01/2012 1550   ALT 25 02/01/2012 1550   BILITOT 0.3 02/01/2012 1550       Studies/Results: [facility]m Digital Diagnostic Bilat  02/27/2013  *RADIOLOGY REPORT*  Clinical Data:  Personal history of right breast cancer status post lumpectomy 2009  DIGITAL DIAGNOSTIC BILATERAL MAMMOGRAM WITH CAD  Comparison: February 08, 2012, February 05, 2011, February 05, 2010, January 17, 2009  Findings:  ACR Breast Density Category scattered fibroglandular tissue  CC and MLO views of bilateral breasts, spot tangential view of right breast are submitted.  Stable postsurgical changes identified within the right breast.  The left breast is negative.  Mammographic images were processed with CAD.  IMPRESSION: Benign findings.  RECOMMENDATION:  Bilateral diagnostic mammogram in 1 year.  I have discussed the findings and recommendations with the patient. Results were also provided in writing at the conclusion of the visit.  If applicable, a reminder letter will be sent to the patient regarding her next appointment.  BI-RADS CATEGORY 2:  Benign finding(s).   Original Report Authenticated By: Molly [name], M.D.     Medications: I have reviewed the patient's current medications.  Assessment/Plan:  1. Breast cancer, right-sided breast cancer (T2 N2) diagnosed in 2009, status post a right lumpectomy and adjuvant radiation. She was diagnosed with a pathologic left femur fracture in November 2011. A staging bone scan and CT scan in November 2011 confirmed multiple bone metastases and a cystic metastasis at the left iliac muscle. No visceral metastases were seen. She began Arimidex following an office visit 11/07/2010. The Arimidex was held for approximately 1 week after an office visit on 02/09/2011 and then resumed on 02/16/2011.  a. The elevated CA 27.29 was improved on 02/09/2011, normal 02/01/2012, normal on 06/03/2012 and 09/22/2012. 2. Status post a left hip hemiarthroplasty for treatment of a pathologic fracture 10/07/2010. 3. Status post radiation to the left proximal femur 12/22 through 11/17/2010. 4. Diabetes. 5. Hypertension. 6. History of arthralgias, likely related to degenerative arthritis 7. Leukocytosis/lymphocytosis-chronic. Review the peripheral blood smear from 02/01/2012 was consistent with a diagnosis of chronic lymphocytic leukemia. A peripheral blood sample was submitted for flow cytometry today on July 20 13,013. This confirmed a diagnosis  of chronic lymphatic leukemia. 8. Recurrent "bladder "pain-relieved with ciprofloxacin, it is not clear the pain is related to urinary tract infection, but she insists the pain is relieved with ciprofloxacin.  Disposition-she will continue Arimidex. We will followup on the CA 27-29. With  regard to the CLL, blood counts remain stable. We again reviewed the increased risk of infections in patients with CLL. She will return for a followup visit in 6 months. She will call the office in the interim with any problems.  Plan reviewed with Molly [name].  Molly [name] ANP/GNP-BC

## 2013-03-30 ENCOUNTER — Telehealth: Payer: Self-pay | Admitting: *Deleted

## 2013-03-30 NOTE — Telephone Encounter (Signed)
Patient called for lab results. Gave her normal results.

## 2013-04-29 ENCOUNTER — Telehealth: Payer: Self-pay | Admitting: *Deleted

## 2013-04-29 NOTE — Telephone Encounter (Signed)
Patient reports feeling a small soft "lump" in her right breast just below the incision area of her lumpectomy. Reports area has become sore over past few weeks. No redness or trauma to area except last mammogram in April was very painful for her.  Asking if Dr. Truett Perna needs to see her?

## 2013-06-10 ENCOUNTER — Other Ambulatory Visit: Payer: Self-pay | Admitting: Cardiology

## 2013-06-10 ENCOUNTER — Other Ambulatory Visit: Payer: Self-pay | Admitting: Oncology

## 2013-06-10 DIAGNOSIS — C50919 Malignant neoplasm of unspecified site of unspecified female breast: Secondary | ICD-10-CM

## 2013-08-28 ENCOUNTER — Encounter: Payer: Self-pay | Admitting: Cardiology

## 2013-08-28 ENCOUNTER — Ambulatory Visit (INDEPENDENT_AMBULATORY_CARE_PROVIDER_SITE_OTHER): Payer: PRIVATE HEALTH INSURANCE | Admitting: Cardiology

## 2013-08-28 VITALS — BP 188/77 | HR 75 | Ht 65.0 in | Wt 212.8 lb

## 2013-08-28 DIAGNOSIS — I119 Hypertensive heart disease without heart failure: Secondary | ICD-10-CM

## 2013-08-28 DIAGNOSIS — R011 Cardiac murmur, unspecified: Secondary | ICD-10-CM

## 2013-08-28 DIAGNOSIS — R0989 Other specified symptoms and signs involving the circulatory and respiratory systems: Secondary | ICD-10-CM

## 2013-08-28 DIAGNOSIS — IMO0001 Reserved for inherently not codable concepts without codable children: Secondary | ICD-10-CM

## 2013-08-28 MED ORDER — ATENOLOL 25 MG PO TABS: 25.0000 mg | ORAL_TABLET | Freq: Two times a day (BID) | ORAL | Status: DC

## 2013-08-28 NOTE — Progress Notes (Signed)
Molly [name] Date of Birth:  1923/01/22 [address] [place], [place]  [address] [phone]         Fax   [phone]  History of Present Illness: This 79 year old woman is seen for a scheduled followup office visit.  She has a past history of essential hypertension and hypercholesterolemia. She also has a history of aortic valve sclerosis. She has had a history of arrhythmia with PVCs. She has a history of breast cancer with metastasis to bone and she had a previous pathologic fracture of her left hip. He does not have any history of ischemic heart disease and she had a normal nuclear stress test in 2009. She was told many years ago by her former PCP Dr. Lendell Caprice that she had a small abdominal aortic aneurysm. She has a history of diabetes and is followed by Dr. Sharl Ma. Since last visit she has not had any new cardiac symptoms.  She has noted some blurring of her vision which she attributes to her blood pressure being high and her blood sugar being high. Her last echocardiogram was 10/20/03 and at that time had an ejection fraction of 55-65% with LVH and with small degree of left ventricular outflow tract dynamic obstruction.  She has never had carotid duplex studies . Current Outpatient Prescriptions  Medication Sig Dispense Refill  . amLODipine-valsartan (EXFORGE) 10-320 MG per tablet Take 1 tablet by mouth daily.  30 tablet  11  . anastrozole (ARIMIDEX) 1 MG tablet TAKE 1 TABLET BY MOUTH EVERY DAY  30 tablet  4  . atenolol (TENORMIN) 25 MG tablet Take 1 tablet (25 mg total) by mouth 2 (two) times daily.  60 tablet  5  . Calcium Carbonate-Vitamin D (CALCIUM + D PO) Take by mouth daily.        . ciprofloxacin (CIPRO) 250 MG tablet Take 1 tablet (250 mg total) by mouth daily as needed.  20 tablet  0  . Cyanocobalamin (VITAMIN B-12 PO) Take by mouth daily.        . fluticasone (FLONASE) 50 MCG/ACT nasal spray Place 2 sprays into the nose daily as needed.       .  furosemide (LASIX) 40 MG tablet Take 40 mg by mouth as needed. For excess fluid retention      . glimepiride (AMARYL) 2 MG tablet Take 2 mg by mouth daily before breakfast.        . Glucose Blood (FREESTYLE LITE TEST VI)       . KLOR-CON M10 10 MEQ tablet TAKE 1 TABLET (10 MEQ TOTAL) BY MOUTH DAILY.  30 tablet  8  . levothyroxine (SYNTHROID, LEVOTHROID) 75 MCG tablet Take 75 mcg by mouth daily.        . metFORMIN (GLUCOPHAGE) 500 MG tablet Take 500 mg by mouth 2 (two) times daily with a meal.        . pantoprazole (PROTONIX) 40 MG tablet Take 40 mg by mouth as needed.        Marland Kitchen ZETIA 10 MG tablet Take 10 mg by mouth Daily.        No current facility-administered medications for this visit.    Allergies  Allergen Reactions  . Codeine   . Demerol   . Librium   . Lipitor [Atorvastatin Calcium]   . Metoprolol     Patient Active Problem List   Diagnosis Date Noted  . Carotid artery bruit 08/28/2013  . Heart murmur, systolic 08/28/2013  . RBBB 02/16/2013  .  Type II or unspecified type diabetes mellitus without mention of complication, uncontrolled 02/16/2013  . Benign hypertensive heart disease without heart failure 08/02/2011  . Pure hypercholesterolemia 08/02/2011  . Asymptomatic PVCs 08/02/2011  . Breast cancer metastasized to bone 08/02/2011    History  Smoking status  . Never Smoker   Smokeless tobacco  . Not on file    History  Alcohol Use No    Family History  Problem Relation Age of Onset  . Hypertension Mother   . Heart attack Father   . Hypertension Father   . Diabetes Sister   . Hypertension Sister   . Liver cancer Sister   . Heart attack Brother   . Hypertension Brother   . Diabetes Brother   . Diabetes Sister   . Hypertension Sister     Review of Systems: Constitutional: no fever chills diaphoresis or fatigue or change in weight.  Head and neck: no hearing loss, no epistaxis, no photophobia or visual disturbance. Respiratory: No cough, shortness of  breath or wheezing. Cardiovascular: No chest pain peripheral edema, palpitations. Gastrointestinal: No abdominal distention, no abdominal pain, no change in bowel habits hematochezia or melena. Genitourinary: No dysuria, no frequency, no urgency, no nocturia. Musculoskeletal:No arthralgias, no back pain, no gait disturbance or myalgias. Neurological: No dizziness, no headaches, no numbness, no seizures, no syncope, no weakness, no tremors. Hematologic: No lymphadenopathy, no easy bruising. Psychiatric: No confusion, no hallucinations, no sleep disturbance.    Physical Exam: Filed Vitals:   08/28/13 1413  BP: 188/77  Pulse: 75   the general appearance reveals an elderly woman in no acute distress.The head and neck exam reveals pupils equal and reactive.  Extraocular movements are full.  There is no scleral icterus.  The mouth and pharynx are normal.  The neck is supple.  The carotids reveal bilateral soft bruits.  These may be transmitted from the base of the heart. The jugular venous pressure is normal.  The  thyroid is not enlarged.  There is no lymphadenopathy.  The chest is clear to percussion and auscultation.  There are no rales or rhonchi.  Expansion of the chest is symmetrical.  The precordium is quiet.  The first heart sound is normal.  The second heart sound is physiologically split.  There is no  gallop rub or click.  There is a soft systolic ejection murmur at the base.  There is no abnormal lift or heave.  The abdomen is soft and nontender.  The bowel sounds are normal.  The liver and spleen are not enlarged.  There are no abdominal masses.  There are no abdominal bruits.  Extremities reveal good pedal pulses.  There is trace left ankle edema.  There is no cyanosis or clubbing.  Strength is normal and symmetrical in all extremities.  There is no lateralizing weakness.  There are no sensory deficits.  The skin is warm and dry.  There is no rash.     Assessment / Plan: Continue same  medication except increase atenolol to 25 mg twice a day.  Return for 2-D echo.  Return for carotid duplex ultrasound.  Work harder on weight loss.  The ovoid carbohydrates and sweets. Recheck in 6 months for followup office visit and EKG.

## 2013-08-28 NOTE — Assessment & Plan Note (Signed)
Exam today shows soft bilateral carotid bruits.  These may actually be transmitted from the base of the heart.  We will check carotid duplex ultrasound

## 2013-08-28 NOTE — Assessment & Plan Note (Signed)
Patient has a history of a soft systolic ejection murmur at the base.  Her last echocardiogram did not show any significant intrinsic aortic valve stenosis.  She does complain of shortness of breath.  She has not been having a chest pain or angina.  She has had dizziness and intermittent lowering of her vision.  We will check an echocardiogram.

## 2013-08-28 NOTE — Patient Instructions (Signed)
INCREASE YOUR ATENOLOL TO 25 MG TWICE A DAY  Your physician has requested that you have an echocardiogram. Echocardiography is a painless test that uses sound waves to create images of your heart. It provides your doctor with information about the size and shape of your heart and how well your heart's chambers and valves are working. This procedure takes approximately one hour. There are no restrictions for this procedure.  Your physician has requested that you have a carotid duplex. This test is an ultrasound of the carotid arteries in your neck. It looks at blood flow through these arteries that supply the brain with blood. Allow one hour for this exam. There are no restrictions or special instructions.  Your physician wants you to follow-up in: 6 month ov/ekg You will receive a reminder letter in the mail two months in advance. If you don't receive a letter, please call our office to schedule the follow-up appointment.

## 2013-08-28 NOTE — Assessment & Plan Note (Signed)
Her blood pressure has not been well controlled recently.  Her weight has been going up.  She has not been careful with her diet.  We will increase her atenolol to 25 mg twice a day for better blood pressure control.

## 2013-09-14 ENCOUNTER — Telehealth: Payer: Self-pay | Admitting: Cardiology

## 2013-09-14 NOTE — Telephone Encounter (Signed)
Spoke with patient regarding appointment tomorrow, aware of times

## 2013-09-14 NOTE — Telephone Encounter (Signed)
New Problem  Pt requesting call to back to discuss Echo and Carotid Procedure// Please call

## 2013-09-15 ENCOUNTER — Ambulatory Visit (HOSPITAL_BASED_OUTPATIENT_CLINIC_OR_DEPARTMENT_OTHER): Payer: PRIVATE HEALTH INSURANCE

## 2013-09-15 ENCOUNTER — Ambulatory Visit (HOSPITAL_COMMUNITY): Payer: PRIVATE HEALTH INSURANCE | Attending: Cardiovascular Disease

## 2013-09-15 DIAGNOSIS — I079 Rheumatic tricuspid valve disease, unspecified: Secondary | ICD-10-CM | POA: Insufficient documentation

## 2013-09-15 DIAGNOSIS — R0989 Other specified symptoms and signs involving the circulatory and respiratory systems: Secondary | ICD-10-CM | POA: Insufficient documentation

## 2013-09-15 DIAGNOSIS — R0609 Other forms of dyspnea: Secondary | ICD-10-CM | POA: Insufficient documentation

## 2013-09-15 DIAGNOSIS — I4949 Other premature depolarization: Secondary | ICD-10-CM | POA: Insufficient documentation

## 2013-09-15 DIAGNOSIS — I080 Rheumatic disorders of both mitral and aortic valves: Secondary | ICD-10-CM | POA: Insufficient documentation

## 2013-09-15 DIAGNOSIS — E119 Type 2 diabetes mellitus without complications: Secondary | ICD-10-CM | POA: Insufficient documentation

## 2013-09-15 DIAGNOSIS — E785 Hyperlipidemia, unspecified: Secondary | ICD-10-CM | POA: Insufficient documentation

## 2013-09-15 DIAGNOSIS — I10 Essential (primary) hypertension: Secondary | ICD-10-CM | POA: Insufficient documentation

## 2013-09-15 DIAGNOSIS — I379 Nonrheumatic pulmonary valve disorder, unspecified: Secondary | ICD-10-CM | POA: Insufficient documentation

## 2013-09-15 DIAGNOSIS — I6529 Occlusion and stenosis of unspecified carotid artery: Secondary | ICD-10-CM

## 2013-09-15 DIAGNOSIS — C7951 Secondary malignant neoplasm of bone: Secondary | ICD-10-CM | POA: Insufficient documentation

## 2013-09-15 DIAGNOSIS — R0602 Shortness of breath: Secondary | ICD-10-CM

## 2013-09-15 DIAGNOSIS — C50919 Malignant neoplasm of unspecified site of unspecified female breast: Secondary | ICD-10-CM | POA: Insufficient documentation

## 2013-09-15 DIAGNOSIS — R011 Cardiac murmur, unspecified: Secondary | ICD-10-CM

## 2013-09-15 NOTE — Progress Notes (Signed)
Echocardiogram performed.  

## 2013-09-21 ENCOUNTER — Ambulatory Visit (HOSPITAL_BASED_OUTPATIENT_CLINIC_OR_DEPARTMENT_OTHER): Payer: PRIVATE HEALTH INSURANCE | Admitting: Oncology

## 2013-09-21 ENCOUNTER — Other Ambulatory Visit (HOSPITAL_BASED_OUTPATIENT_CLINIC_OR_DEPARTMENT_OTHER): Payer: PRIVATE HEALTH INSURANCE | Admitting: Lab

## 2013-09-21 ENCOUNTER — Telehealth: Payer: Self-pay | Admitting: Oncology

## 2013-09-21 VITALS — BP 179/64 | HR 76 | Temp 98.7°F | Resp 17 | Ht 65.0 in | Wt 210.8 lb

## 2013-09-21 DIAGNOSIS — C50919 Malignant neoplasm of unspecified site of unspecified female breast: Secondary | ICD-10-CM

## 2013-09-21 DIAGNOSIS — C9110 Chronic lymphocytic leukemia of B-cell type not having achieved remission: Secondary | ICD-10-CM

## 2013-09-21 DIAGNOSIS — C7951 Secondary malignant neoplasm of bone: Secondary | ICD-10-CM

## 2013-09-21 DIAGNOSIS — C50911 Malignant neoplasm of unspecified site of right female breast: Secondary | ICD-10-CM

## 2013-09-21 LAB — CBC WITH DIFFERENTIAL/PLATELET
BASO%: 0.6 % (ref 0.0–2.0)
EOS%: 1.1 % (ref 0.0–7.0)
HCT: 37.3 % (ref 34.8–46.6)
MCH: 26.6 pg (ref 25.1–34.0)
MCHC: 31.5 g/dL (ref 31.5–36.0)
MCV: 84.3 fL (ref 79.5–101.0)
MONO#: 1 10*3/uL — ABNORMAL HIGH (ref 0.1–0.9)
MONO%: 4.7 % (ref 0.0–14.0)
NEUT#: 10.6 10*3/uL — ABNORMAL HIGH (ref 1.5–6.5)
NEUT%: 50.5 % (ref 38.4–76.8)
RBC: 4.43 10*6/uL (ref 3.70–5.45)
RDW: 14.2 % (ref 11.2–14.5)
WBC: 21 10*3/uL — ABNORMAL HIGH (ref 3.9–10.3)
lymph#: 9.1 10*3/uL — ABNORMAL HIGH (ref 0.9–3.3)

## 2013-09-21 LAB — TECHNOLOGIST REVIEW

## 2013-09-21 NOTE — Progress Notes (Signed)
   [facility]    OFFICE PROGRESS NOTE   INTERVAL HISTORY:   Ms. Broshears returns as scheduled. She continues the Arimidex. She reports discomfort at the right breast is undergoing a mammogram earlier this year. She continues to have intermittent pain at the left hip. No other new areas of pain. No fever or anorexia. Occasional night sweats. No recent infections aside from a urinary tract infections. A mammogram on 02/27/2013 revealed stable postsurgical changes within the right breast.  Objective:  Vital signs in last 24 hours:  Blood pressure 179/64, pulse 76, temperature 98.7 F (37.1 C), temperature source Oral, resp. rate 17, height 5\' 5"  (1.651 m), weight 210 lb 12.8 oz (95.618 kg), SpO2 97.00%.    HEENT: Neck without mass Lymphatics: No cervical, supraclavicular, axillary, or inguinal nodes Resp: Lungs clear bilaterally Cardio: Regular rate and rhythm GI: No hepatosplenomegaly Vascular: No leg edema  Breasts: Status post right lumpectomy. Firm tissue at the right upper breast without a discrete mass. Left breast without mass.    Lab Results:  Lab Results  Component Value Date   WBC 21.0* 09/21/2013   HGB 11.8 09/21/2013   HCT 37.3 09/21/2013   MCV 84.3 09/21/2013   PLT 284 09/21/2013   ANC 10.6 Absolute lymphocyte count 9.1  CA 27.29 03/20/2013-18  Medications: I have reviewed the patient's current medications.  Assessment/Plan: 1. Breast cancer, right-sided breast cancer (T2 N2) diagnosed in 2009, status post a right lumpectomy and adjuvant radiation. She was diagnosed with a pathologic left femur fracture in November 2011. A staging bone scan and CT scan in November 2011 confirmed multiple bone metastases and a cystic metastasis at the left iliac muscle. No visceral metastases were seen. She began Arimidex following an office visit 11/07/2010. The Arimidex was held for approximately 1 week after an office visit on 02/09/2011 and then resumed on  02/16/2011.  a. The elevated CA 27.29 was normal on 03/20/2013 2. Status post a left hip hemiarthroplasty for treatment of a pathologic fracture 10/07/2010. 3. Status post radiation to the left proximal femur 12/22 through 11/17/2010. 4. Diabetes. 5. Hypertension. 6. History of arthralgias, likely related to degenerative arthritis 7. Leukocytosis/lymphocytosis-chronic. Review the peripheral blood smear from 02/01/2012 was consistent with a diagnosis of chronic lymphocytic leukemia. A peripheral blood sample was submitted for flow cytometry today on July 20 13,013. This confirmed a diagnosis of chronic lymphatic leukemia. 8. Recurrent "bladder "pain-relieved with ciprofloxacin, it is not clear the pain is related to urinary tract infection, but she insists the pain is relieved with ciprofloxacin.   Disposition:  She remains asymptomatic from the CLL. There is no indication for treating the CLL. She continues Arimidex. No clinical evidence of disease progression.  Ms. Vanderzanden will return for an office and lab visit in 6 months. She will contact us in the interim for new symptoms. She declined an influenza vaccine.  Thornton Papas, MD  09/21/2013  5:09 PM

## 2013-09-21 NOTE — Telephone Encounter (Signed)
Gave pt appt for lab and ML for May 2015

## 2013-09-22 LAB — CANCER ANTIGEN 27.29: CA 27.29: 22 U/mL (ref 0–39)

## 2013-09-23 ENCOUNTER — Telehealth: Payer: Self-pay | Admitting: *Deleted

## 2013-09-23 NOTE — Telephone Encounter (Signed)
Informed patient of normal tumor marker. Per. Dr. Truett Perna.  Patient verbalized understanding.

## 2013-09-23 NOTE — Telephone Encounter (Signed)
Message copied by Raphael Gibney on Wed Sep 23, 2013  2:05 PM ------      Message from: [place], [place] P      Created: Wed Sep 23, 2013  1:35 PM                   ----- Message -----         From: Ladene Artist, MD         Sent: 09/22/2013   8:31 PM           To: Wandalee Ferdinand, RN, Glori Luis, RN, #            Please call patient, tumor marker is normal ------

## 2013-10-08 ENCOUNTER — Other Ambulatory Visit: Payer: Self-pay | Admitting: Cardiology

## 2013-10-19 ENCOUNTER — Emergency Department (HOSPITAL_COMMUNITY): Payer: PRIVATE HEALTH INSURANCE

## 2013-10-19 ENCOUNTER — Emergency Department (HOSPITAL_COMMUNITY)
Admission: EM | Admit: 2013-10-19 | Discharge: 2013-10-19 | Disposition: A | Discharge status: Home / Self Care | Payer: PRIVATE HEALTH INSURANCE | Attending: Emergency Medicine | Admitting: Emergency Medicine

## 2013-10-19 ENCOUNTER — Encounter (HOSPITAL_COMMUNITY): Payer: Self-pay | Admitting: Emergency Medicine

## 2013-10-19 DIAGNOSIS — R109 Unspecified abdominal pain: Secondary | ICD-10-CM

## 2013-10-19 DIAGNOSIS — G8929 Other chronic pain: Secondary | ICD-10-CM | POA: Insufficient documentation

## 2013-10-19 DIAGNOSIS — E119 Type 2 diabetes mellitus without complications: Secondary | ICD-10-CM | POA: Insufficient documentation

## 2013-10-19 DIAGNOSIS — C50912 Malignant neoplasm of unspecified site of left female breast: Secondary | ICD-10-CM

## 2013-10-19 DIAGNOSIS — Z9089 Acquired absence of other organs: Secondary | ICD-10-CM | POA: Insufficient documentation

## 2013-10-19 DIAGNOSIS — E039 Hypothyroidism, unspecified: Secondary | ICD-10-CM | POA: Insufficient documentation

## 2013-10-19 DIAGNOSIS — C7981 Secondary malignant neoplasm of breast: Secondary | ICD-10-CM | POA: Insufficient documentation

## 2013-10-19 DIAGNOSIS — Z9079 Acquired absence of other genital organ(s): Secondary | ICD-10-CM | POA: Insufficient documentation

## 2013-10-19 DIAGNOSIS — M25559 Pain in unspecified hip: Secondary | ICD-10-CM | POA: Insufficient documentation

## 2013-10-19 DIAGNOSIS — Z79899 Other long term (current) drug therapy: Secondary | ICD-10-CM | POA: Insufficient documentation

## 2013-10-19 DIAGNOSIS — R630 Anorexia: Secondary | ICD-10-CM | POA: Insufficient documentation

## 2013-10-19 DIAGNOSIS — Z87311 Personal history of (healed) other pathological fracture: Secondary | ICD-10-CM | POA: Insufficient documentation

## 2013-10-19 DIAGNOSIS — R1032 Left lower quadrant pain: Secondary | ICD-10-CM | POA: Insufficient documentation

## 2013-10-19 DIAGNOSIS — Z96649 Presence of unspecified artificial hip joint: Secondary | ICD-10-CM | POA: Insufficient documentation

## 2013-10-19 DIAGNOSIS — I10 Essential (primary) hypertension: Secondary | ICD-10-CM | POA: Insufficient documentation

## 2013-10-19 DIAGNOSIS — Z96659 Presence of unspecified artificial knee joint: Secondary | ICD-10-CM | POA: Insufficient documentation

## 2013-10-19 DIAGNOSIS — Z90710 Acquired absence of both cervix and uterus: Secondary | ICD-10-CM | POA: Insufficient documentation

## 2013-10-19 LAB — CBC WITH DIFFERENTIAL/PLATELET
Basophils Relative: 0 % (ref 0–1)
Eosinophils Absolute: 0.2 10*3/uL (ref 0.0–0.7)
Eosinophils Relative: 1 % (ref 0–5)
Hemoglobin: 12.6 g/dL (ref 12.0–15.0)
Lymphocytes Relative: 36 % (ref 12–46)
Lymphs Abs: 6.9 10*3/uL — ABNORMAL HIGH (ref 0.7–4.0)
MCHC: 32.6 g/dL (ref 30.0–36.0)
Monocytes Relative: 4 % (ref 3–12)
Neutro Abs: 11.4 10*3/uL — ABNORMAL HIGH (ref 1.7–7.7)
Neutrophils Relative %: 59 % (ref 43–77)
RBC: 4.61 MIL/uL (ref 3.87–5.11)
RDW: 14 % (ref 11.5–15.5)
WBC: 19.3 10*3/uL — ABNORMAL HIGH (ref 4.0–10.5)

## 2013-10-19 LAB — URINALYSIS, ROUTINE W REFLEX MICROSCOPIC
Bilirubin Urine: NEGATIVE
Glucose, UA: 250 mg/dL — AB
Hgb urine dipstick: NEGATIVE
Specific Gravity, Urine: 1.006 (ref 1.005–1.030)
pH: 7 (ref 5.0–8.0)

## 2013-10-19 LAB — COMPREHENSIVE METABOLIC PANEL
ALT: 30 U/L (ref 0–35)
Albumin: 3.3 g/dL — ABNORMAL LOW (ref 3.5–5.2)
Alkaline Phosphatase: 92 U/L (ref 39–117)
BUN: 8 mg/dL (ref 6–23)
Chloride: 94 mEq/L — ABNORMAL LOW (ref 96–112)
GFR calc Af Amer: >90 mL/min (ref >90)
Glucose, Bld: 247 mg/dL — ABNORMAL HIGH (ref 70–99)
Potassium: 3.5 mEq/L (ref 3.5–5.1)
Sodium: 131 mEq/L — ABNORMAL LOW (ref 135–145)
Total Protein: 6.7 g/dL (ref 6.0–8.3)

## 2013-10-19 LAB — PATHOLOGIST SMEAR REVIEW

## 2013-10-19 MED ORDER — SODIUM CHLORIDE 0.9 % IV BOLUS (SEPSIS)
1000.0000 mL | Freq: Once | INTRAVENOUS | Status: AC
  Administered 2013-10-19: 1000 mL via INTRAVENOUS

## 2013-10-19 MED ORDER — SODIUM CHLORIDE 0.9 % IV BOLUS (SEPSIS): 1000.0000 mL | Freq: Once | INTRAVENOUS | Status: DC

## 2013-10-19 NOTE — ED Notes (Signed)
IV team at bedside 

## 2013-10-19 NOTE — ED Provider Notes (Signed)
CSN: [id number]     Arrival date & time 10/19/13  4098 History   First MD Initiated Contact with Patient 10/19/13 [phone]     Chief Complaint  Patient presents with  . Abdominal Pain  . Hip Pain   (Consider location/radiation/quality/duration/timing/severity/associated sxs/prior Treatment) HPI SUMMERS BUENDIA is a 79 y.o. female who presents to the emergency department for concern of LLQ abdominal pain and L hip pain.  Patient has history of metastatic breast cancer that resulted in pathologic fx of L hip 2 years ago.  Since then, patient has had chronic L hip pain.  This is somewhat unchanged, but over the last two to three days shw has had increasing LLQ abdominal pain.  Sharp.  Moderate in severity.  Worse with palpation.  Better with nothing.  No pain radiation.  Associated with nausea, decreased appetite.  Feeling of constipation but still having bowel movements.  No changes in urination.  No other symptoms.  Past Medical History  Diagnosis Date  . Diabetes mellitus   . Hypertension   . Arthritis   . MVP (mitral valve prolapse)   . Hx of breast cancer     RIGHT BREAST  . Hypothyroidism    Past Surgical History  Procedure Laterality Date  . Hemiarthroplasty hip    . Knee arthroscopy      RIGHT KNEE  . Total abdominal hysterectomy w/ bilateral salpingoophorectomy    . Pelvic and para-aortic lymph node dissection    . Tonsillectomy    . Appendectomy    . Cholecystectomy    . US echocardiography  03/30/2009    EF 55-60%  . Cardiovascular stress test  01/28/2008    EF 74%   Family History  Problem Relation Age of Onset  . Hypertension Mother   . Heart attack Father   . Hypertension Father   . Diabetes Sister   . Hypertension Sister   . Liver cancer Sister   . Heart attack Brother   . Hypertension Brother   . Diabetes Brother   . Diabetes Sister   . Hypertension Sister    History  Substance Use Topics  . Smoking status: Never Smoker   . Smokeless tobacco: Not on file   . Alcohol Use: No   OB History   Grav Para Term Preterm Abortions TAB SAB Ect Mult Living                 Review of Systems  Constitutional: Negative for fever and chills.  HENT: Negative for congestion and rhinorrhea.   Respiratory: Negative for cough and shortness of breath.   Cardiovascular: Negative for chest pain.  Gastrointestinal: Positive for nausea and abdominal pain. Negative for vomiting, diarrhea and abdominal distention.  Endocrine: Negative for polyuria.  Genitourinary: Negative for dysuria.  Musculoskeletal: Negative for neck pain and neck stiffness.  Skin: Negative for rash.  Neurological: Negative for headaches.  Psychiatric/Behavioral: Negative.     Allergies  Codeine; Demerol; Librium; Lipitor; and Metoprolol  Home Medications   Current Outpatient Rx  Name  Route  Sig  Dispense  Refill  . anastrozole (ARIMIDEX) 1 MG tablet      TAKE 1 TABLET BY MOUTH EVERY DAY   30 tablet   4   . atenolol (TENORMIN) 25 MG tablet   Oral   Take 1 tablet (25 mg total) by mouth 2 (two) times daily.   60 tablet   11     NEW DOSE, WILL CALL WHEN NEEDS REFILLED   .  Calcium Carbonate-Vitamin D (CALCIUM + D PO)   Oral   Take by mouth daily.           . ciprofloxacin (CIPRO) 250 MG tablet   Oral   Take 1 tablet (250 mg total) by mouth daily as needed.   20 tablet   0   . Cyanocobalamin (VITAMIN B-12 PO)   Oral   Take by mouth daily.           Marland Kitchen EXFORGE 10-320 MG per tablet      TAKE 1 TABLET BY MOUTH DAILY.   30 tablet   3   . fluticasone (FLONASE) 50 MCG/ACT nasal spray   Nasal   Place 2 sprays into the nose daily as needed.          . furosemide (LASIX) 40 MG tablet   Oral   Take 40 mg by mouth as needed. For excess fluid retention         . glimepiride (AMARYL) 4 MG tablet   Oral   Take 4 mg by mouth daily with breakfast.         . Glucose Blood (FREESTYLE LITE TEST VI)               . KLOR-CON M10 10 MEQ tablet      TAKE 1  TABLET (10 MEQ TOTAL) BY MOUTH DAILY.   30 tablet   8   . levothyroxine (SYNTHROID, LEVOTHROID) 75 MCG tablet   Oral   Take 75 mcg by mouth daily.           . metFORMIN (GLUCOPHAGE) 500 MG tablet   Oral   Take 500 mg by mouth 2 (two) times daily with a meal.           . pantoprazole (PROTONIX) 40 MG tablet   Oral   Take 40 mg by mouth as needed.           Marland Kitchen ZETIA 10 MG tablet   Oral   Take 10 mg by mouth Daily.           BP 157/111  Pulse 79  Temp(Src) 98 F (36.7 C)  Resp 18  SpO2 100% Physical Exam  Nursing note and vitals reviewed. Constitutional: She is oriented to person, place, and time. She appears well-developed and well-nourished. No distress.  HENT:  Head: Normocephalic and atraumatic.  Right Ear: External ear normal.  Left Ear: External ear normal.  Nose: Nose normal.  Mouth/Throat: Oropharynx is clear and moist. No oropharyngeal exudate.  Eyes: EOM are normal. Pupils are equal, round, and reactive to light.  Neck: Normal range of motion. Neck supple. No tracheal deviation present.  Cardiovascular: Normal rate.   Pulmonary/Chest: Effort normal and breath sounds normal. No stridor. No respiratory distress. She has no wheezes. She has no rales.  Abdominal: Soft. She exhibits no distension. There is tenderness in the left lower quadrant. There is no rebound.  Musculoskeletal: Normal range of motion.  Neurological: She is alert and oriented to person, place, and time.  Skin: Skin is warm and dry. She is not diaphoretic.    ED Course  Procedures (including critical care time) Labs Review Labs Reviewed  CBC WITH DIFFERENTIAL - Abnormal; Notable for the following:    WBC 19.3 (*)    Neutro Abs 11.4 (*)    Lymphs Abs 6.9 (*)    All other components within normal limits  COMPREHENSIVE METABOLIC PANEL - Abnormal; Notable for the following:  Sodium 131 (*)    Chloride 94 (*)    Glucose, Bld 247 (*)    Albumin 3.3 (*)    GFR calc non Af Amer 79 (*)     All other components within normal limits  LACTIC ACID, PLASMA - Abnormal; Notable for the following:    Lactic Acid, Venous 4.2 (*)    All other components within normal limits  URINALYSIS, ROUTINE W REFLEX MICROSCOPIC - Abnormal; Notable for the following:    Glucose, UA 250 (*)    All other components within normal limits  LACTIC ACID, PLASMA - Abnormal; Notable for the following:    Lactic Acid, Venous 3.2 (*)    All other components within normal limits  PATHOLOGIST SMEAR REVIEW   Imaging Review Ct Abdomen Pelvis Wo Contrast  10/19/2013   CLINICAL DATA:  Abdominal pain.  Left hip pain.  EXAM: CT ABDOMEN AND PELVIS WITHOUT CONTRAST  TECHNIQUE: Multidetector CT imaging of the abdomen and pelvis was performed following the standard protocol without intravenous contrast.  COMPARISON:  Abdominal CT 06/12/2007 from [facility]. Pelvic MRI 10/05/2010.  FINDINGS: Trace atelectasis is noted in the visualized lung bases. Aortic valve calcifications and coronary artery calcifications are partially visualized.  The liver, spleen, adrenal glands, and pancreas have an unremarkable noncontrast appearance. There is mild bilateral renal atrophy, unchanged. The gallbladder is not identified and may be absent.  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 Atherosclerotic aortoiliac calcifications are again seen. Left hip arthroplasty is in place, with associated streak artifact partially obscuring the inferior aspect of the pelvis. There is sigmoid colon diverticulosis is noted without evidence of diverticulitis. There is no bowel dilatation. No enlarged lymph nodes or free fluid is identified in the abdomen or pelvis. There is no intraperitoneal free air. The uterus is absent. The right ovary is unremarkable. Left ovary is not clearly identified. Cystic lesion anterior to the left iliacus muscle measures 6.2  x 4.8 cm, unchanged from prior pelvic MRI.  Lucent lesions in the T12 and L1 vertebral bodies are unchanged and likely represent hemangiomas. There is an approximately 2 cm sclerotic lesion in the right aspect of the L3 vertebral body. There is also sclerosis involving the anterior aspect of the T12 vertebral body right of midline. Multilevel bridging osteophytosis is present in the visualized lower thoracic spine. Multilevel degenerative disc disease and facet arthrosis are noted in the mid and lower lumbar spine.  IMPRESSION: 1. No etiology of abdominal pain identified. 2. Unchanged cystic lesion anterior to the left iliacus muscle. 3. Sclerotic lesions within the T12 and L3 vertebral bodies, concerning for metastases. These are new from 2008, although the L3 lesion was described on a CT from 10/11/2010 (images from this study not available for review at the time of dictation).   Electronically Signed   By: Sebastian Ache   On: 10/19/2013 11:49    EKG Interpretation   None       MDM   1. Abdominal pain   2. Breast cancer, left    GENIYAH EISCHEID is a 79 y.o. female with history of metastatic breast cancer and L hip arthroplasty who presented to the ED with L hip pain and LLQ abdominal pain.  L hip pain chronic and unchanged.  CT abdomen done for abdominal pain and found cystic lesion under L iliacus muscle.  Feel that this is likely contributing to patient's  pain.  Doubt septic hip or fracture.  Patient able to ambulate.  No indication for MRI.  For abdominal pain, lactate elevated to 4.2.  Rehydration initiated and CT warranted.  Patient also noted to have leukocytosis to 19 but this was expected given her history of CLL.  CT performed and finding continued progression of metastatic disease and cystic lesion in iliacus muscle.  Patient made aware of these findings.  Abdominal exam improved.  Patient able to stand unassisted.  Lactate repeated and improved to 3.2.  Feel that given patient appearance,  patient safe for discharge home.  Symptoms likely related to above findings.  Recommend f/u with PCP and oncologist.  Strict return precautions discussed.  Patient discharged.    Arloa Koh, MD 10/19/13 1547

## 2013-10-19 NOTE — ED Notes (Signed)
Pt finished drinking contrast. CT aware. 

## 2013-10-19 NOTE — ED Notes (Signed)
Per pt she has been having LL abdominal pain and hip pain. sts unable to have BM. Denies N,V D.

## 2013-10-20 ENCOUNTER — Telehealth: Payer: Self-pay | Admitting: *Deleted

## 2013-10-20 NOTE — Telephone Encounter (Signed)
Received message from pt stating "I went to the ED yesterday for abdominal pain and they did a CT and said I need to see Dr. Truett Perna asap because I have a new cyst on my spine"  Pt requesting appt date/time.  Note to Dr. Truett Perna.

## 2013-10-21 ENCOUNTER — Telehealth: Payer: Self-pay | Admitting: Oncology

## 2013-10-21 NOTE — Telephone Encounter (Signed)
pt wanted appt made w Dr Truett Perna asap because she had CT scan yesterday and they told her they found something and she needed to see him now...sw nurse Amy H and Dr Kathie Rhodes is aware of this patient's situation and they will call her soon with appt time shh

## 2013-10-21 NOTE — Telephone Encounter (Signed)
Per Dr. Truett Perna, notified pt MD can see her tomorrow 10/22/13 @ 2:45.  Pt verbalized understanding and stated "I'll be there"  POF complete.

## 2013-10-21 NOTE — Telephone Encounter (Signed)
per 12/10 POF appt made per [facility] pt aware per POF shh

## 2013-10-22 ENCOUNTER — Ambulatory Visit (HOSPITAL_BASED_OUTPATIENT_CLINIC_OR_DEPARTMENT_OTHER): Payer: PRIVATE HEALTH INSURANCE | Admitting: Oncology

## 2013-10-22 ENCOUNTER — Telehealth: Payer: Self-pay | Admitting: Oncology

## 2013-10-22 VITALS — BP 181/64 | HR 75 | Temp 97.4°F | Resp 20 | Wt 209.0 lb

## 2013-10-22 DIAGNOSIS — M899 Disorder of bone, unspecified: Secondary | ICD-10-CM

## 2013-10-22 DIAGNOSIS — C50911 Malignant neoplasm of unspecified site of right female breast: Secondary | ICD-10-CM

## 2013-10-22 DIAGNOSIS — C50919 Malignant neoplasm of unspecified site of unspecified female breast: Secondary | ICD-10-CM

## 2013-10-22 DIAGNOSIS — R52 Pain, unspecified: Secondary | ICD-10-CM

## 2013-10-22 DIAGNOSIS — C9110 Chronic lymphocytic leukemia of B-cell type not having achieved remission: Secondary | ICD-10-CM

## 2013-10-22 DIAGNOSIS — C7951 Secondary malignant neoplasm of bone: Secondary | ICD-10-CM

## 2013-10-22 MED ORDER — TRAMADOL HCL 50 MG PO TABS: 25.0000 mg | ORAL_TABLET | Freq: Four times a day (QID) | ORAL | Status: DC | PRN

## 2013-10-22 NOTE — Telephone Encounter (Signed)
gv and printed appt sched and avs for pt for DEC °

## 2013-10-22 NOTE — Progress Notes (Addendum)
[facility]    OFFICE PROGRESS NOTE   INTERVAL HISTORY:   Molly [name] returns prior to a scheduled visit. She continues Arimidex. She reports intermittent pain at the left iliac region for the past month. The pain became more severe on 10/19/2013 and she presented to the emergency room. A CT of the abdomen revealed sigmoid diverticulosis without evidence of diverticulitis the no enlarged lymph nodes. A cystic lesion anterior to the left iliac as muscle measured 6.2 x 4.8 cm and was unchanged compared to a MRI of the pelvis from November of 2011. A 2 cm sclerotic lesion was noted in the right aspect of the L3 vertebral body and sclerosis was noted at the anterior aspect of T12.  The pain has improved. She takes Tylenol occasionally. No other complaint.  Objective:  Vital signs in last 24 hours:  Blood pressure 181/64, pulse 75, temperature 97.4 F (36.3 C), temperature source Oral, resp. rate 20, weight 209 lb (94.802 kg).    HEENT: Neck without mass Lymphatics: No cervical, supraclavicular, axillary, or inguinal nodes Resp: Lungs clear bilaterally Cardio: Regular rate and rhythm GI: No hepatomegaly, nontender, no mass Vascular: No leg edema Musculoskeletal: No tenderness at the left iliac. No pain with motion at the left hip  Breast: Status post right lumpectomy. No evidence for local tumor recurrence the    Lab Results:  Lab Results  Component Value Date   WBC 19.3* 10/19/2013   HGB 12.6 10/19/2013   HCT 38.6 10/19/2013   MCV 83.7 10/19/2013   PLT 313 10/19/2013   ANC 11.4, absolute lymphocyte count 6.9   CA 27-29 on 09/21/2013-22  X-rays: I reviewed the 10/19/2013 CT with Ms. Gibler and her daughter. We also reviewed the pelvic MRI from November of 2011 and the bone scan from 10/06/2010. The cystic lesion adjacent to the left iliac was present in 2011. Multiple bone metastases were seen on the bone scan including spine lesions   Medications: I have  reviewed the patient's current medications.  Assessment/Plan:  1. Breast cancer, right-sided breast cancer (T2 N2) diagnosed in 2009, status post a right lumpectomy and adjuvant radiation. She was diagnosed with a pathologic left femur fracture in November 2011. A staging bone scan and CT scan in November 2011 confirmed multiple bone metastases and a cystic metastasis at the left iliac muscle. No visceral metastases were seen. She began Arimidex following an office visit 11/07/2010. The Arimidex was held for approximately 1 week after an office visit on 02/09/2011 and then resumed on 02/16/2011.  a. The elevated CA 27.29 was normal on 09/21/2013 2. Status post a left hip hemiarthroplasty for treatment of a pathologic fracture 10/07/2010. 3. Status post radiation to the left proximal femur 12/22 through 11/17/2010. 4. Diabetes. 5. Hypertension. 6. History of arthralgias, likely related to degenerative arthritis 7. Leukocytosis/lymphocytosis-chronic. Review the peripheral blood smear from 02/01/2012 was consistent with a diagnosis of chronic lymphocytic leukemia. A peripheral blood sample was submitted for flow cytometry today on July 20 13,013. This confirmed a diagnosis of chronic lymphatic leukemia. 8. Recurrent "bladder "pain-relieved with ciprofloxacin, it is not clear the pain is related to urinary tract infection, but she insists the pain is relieved with ciprofloxacin. 9. Left abdomen/iliac pain-etiology unclear    Disposition:  Ms. Teem has a history of metastatic breast cancer involving the bones. She is maintained on Arimidex and the serum tumor marker remains normal. She now has pain at the left lateral abdomen/iliac region. The pain could be  related to the cystic lesion near the left iliac, but this lesion has been present for 3 years and is unchanged in size. It is not clear whether this lesion is related to metastatic breast cancer, CLL, or another etiology.  I gave her a  prescription for tramadol to use as needed for pain. She will be scheduled for a bone scan prior to a followup office visit on 11/06/2013. We will consider palliative radiation to the left pelvic lesion if it becomes clear this is causing her pain.  She will see Dr. Charlann Boxer next week and will ask for his opinion regarding the etiology of her pain. It is possible the pain is related to metastatic breast cancer involving the iliac bone or spine. I suspect the spine lesions seen on the 10/19/2013 CT were present in 2011.   Thornton Papas, MD  10/22/2013  6:01 PM  Addendum: I reviewed the imaging studies in radiology 10/23/2013. The cystic left pelvic lesion has been present on x-ray studies dating back to 2002. This is likely a benign lesion. No clear evidence of metastatic disease involving the spine on CT.

## 2013-10-23 NOTE — ED Provider Notes (Signed)
I saw and evaluated the patient, reviewed the resident's note and I agree with the findings and plan.  EKG Interpretation   None       Pt with hx of CA with mets who presents with left hip and LLQ pain.  Denies infectious sx but c/o of constipation.  Known mets to the left hip.  Pt withCLL so elevated WBC but improved from prior.  CT without acute abd process.  After hydration lactate improving.  No signs of sepsis feel lactate was dehydration as pt admits to not eating or drinking much.  She is tolerating po's and feels better.  Will d/c home.  Gwyneth Sprout, MD 10/23/13 [phone]

## 2013-11-04 ENCOUNTER — Ambulatory Visit (HOSPITAL_COMMUNITY)
Admission: RE | Admit: 2013-11-04 | Discharge: 2013-11-04 | Disposition: A | Discharge status: Home / Self Care | Payer: PRIVATE HEALTH INSURANCE | Source: Ambulatory Visit | Attending: Oncology | Admitting: Oncology

## 2013-11-04 ENCOUNTER — Encounter (HOSPITAL_COMMUNITY)
Admission: RE | Admit: 2013-11-04 | Discharge: 2013-11-04 | Disposition: A | Discharge status: Home / Self Care | Payer: PRIVATE HEALTH INSURANCE | Source: Ambulatory Visit | Attending: Oncology | Admitting: Oncology

## 2013-11-04 DIAGNOSIS — Z853 Personal history of malignant neoplasm of breast: Secondary | ICD-10-CM | POA: Insufficient documentation

## 2013-11-04 DIAGNOSIS — M25559 Pain in unspecified hip: Secondary | ICD-10-CM | POA: Insufficient documentation

## 2013-11-04 DIAGNOSIS — C7951 Secondary malignant neoplasm of bone: Secondary | ICD-10-CM | POA: Insufficient documentation

## 2013-11-04 DIAGNOSIS — C50911 Malignant neoplasm of unspecified site of right female breast: Secondary | ICD-10-CM

## 2013-11-04 MED ORDER — TECHNETIUM TC 99M MEDRONATE IV KIT
25.0000 | PACK | Freq: Once | INTRAVENOUS | Status: AC | PRN
  Administered 2013-11-04: 25 via INTRAVENOUS

## 2013-11-05 ENCOUNTER — Ambulatory Visit (HOSPITAL_COMMUNITY): Payer: PRIVATE HEALTH INSURANCE

## 2013-11-05 ENCOUNTER — Encounter (HOSPITAL_COMMUNITY): Payer: PRIVATE HEALTH INSURANCE

## 2013-11-06 ENCOUNTER — Ambulatory Visit (HOSPITAL_BASED_OUTPATIENT_CLINIC_OR_DEPARTMENT_OTHER): Payer: PRIVATE HEALTH INSURANCE | Admitting: Nurse Practitioner

## 2013-11-06 VITALS — BP 175/83 | HR 79 | Temp 97.6°F | Resp 20 | Ht 65.0 in | Wt 209.5 lb

## 2013-11-06 DIAGNOSIS — R109 Unspecified abdominal pain: Secondary | ICD-10-CM

## 2013-11-06 DIAGNOSIS — C50919 Malignant neoplasm of unspecified site of unspecified female breast: Secondary | ICD-10-CM

## 2013-11-06 DIAGNOSIS — I10 Essential (primary) hypertension: Secondary | ICD-10-CM

## 2013-11-06 DIAGNOSIS — E119 Type 2 diabetes mellitus without complications: Secondary | ICD-10-CM

## 2013-11-06 DIAGNOSIS — C7951 Secondary malignant neoplasm of bone: Secondary | ICD-10-CM

## 2013-11-06 NOTE — Progress Notes (Signed)
OFFICE PROGRESS NOTE  Interval history:  [name] returns as scheduled. Bone scan 11/04/2013 showed stable multifocal areas of bone metastasis. No new or progressive disease was identified. There was resolution of previous abnormal uptake within the proximal left femur.  She continues to have mild intermittent pain at the left iliac region. She occasionally takes Tylenol. She reports being diagnosed with "bursitis" recent. She is applying ice to the hip. Appetite varies. She reports her weight is stable. Blood sugars are "up and down".   Objective: Filed Vitals:   11/06/13 1150  BP: 175/83  Pulse: 79  Temp: 97.6 F (36.4 C)  Resp: 20     No thrush or ulcerations. No palpable cervical or supraclavicular lymph nodes. Lungs are clear. Regular cardiac rhythm. Faint systolic murmur. Abdomen soft and nontender. No hepatomegaly. Trace lower leg edema bilaterally. Calves soft and nontender.  Lab Results: Lab Results  Component Value Date   WBC 19.3* 10/19/2013   HGB 12.6 10/19/2013   HCT 38.6 10/19/2013   MCV 83.7 10/19/2013   PLT 313 10/19/2013   NEUTROABS 11.4* 10/19/2013    Chemistry:    Chemistry      Component Value Date/Time   NA 131* 10/19/2013 0920   K 3.5 10/19/2013 0920   CL 94* 10/19/2013 0920   CO2 22 10/19/2013 0920   BUN 8 10/19/2013 0920   CREATININE 0.61 10/19/2013 0920      Component Value Date/Time   CALCIUM 9.3 10/19/2013 0920   ALKPHOS 92 10/19/2013 0920   AST 24 10/19/2013 0920   ALT 30 10/19/2013 0920   BILITOT 0.4 10/19/2013 0920       Studies/Results: Ct Abdomen Pelvis Wo Contrast  10/19/2013   CLINICAL DATA:  Abdominal pain.  Left hip pain.  EXAM: CT ABDOMEN AND PELVIS WITHOUT CONTRAST  TECHNIQUE: Multidetector CT imaging of the abdomen and pelvis was performed following the standard protocol without intravenous contrast.  COMPARISON:  Abdominal CT 06/12/2007 from [facility]. Pelvic MRI 10/05/2010.  FINDINGS: Trace atelectasis is noted in the visualized  lung bases. Aortic valve calcifications and coronary artery calcifications are partially visualized.  The liver, spleen, adrenal glands, and pancreas have an unremarkable noncontrast appearance. There is mild bilateral renal atrophy, unchanged. The gallbladder is not identified and may be absent.  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 Atherosclerotic aortoiliac calcifications are again seen. Left hip arthroplasty is in place, with associated streak artifact partially obscuring the inferior aspect of the pelvis. There is sigmoid colon diverticulosis is noted without evidence of diverticulitis. There is no bowel dilatation. No enlarged lymph nodes or free fluid is identified in the abdomen or pelvis. There is no intraperitoneal free air. The uterus is absent. The right ovary is unremarkable. Left ovary is not clearly identified. Cystic lesion anterior to the left iliacus muscle measures 6.2 x 4.8 cm, unchanged from prior pelvic MRI.  Lucent lesions in the T12 and L1 vertebral bodies are unchanged and likely represent hemangiomas. There is an approximately 2 cm sclerotic lesion in the right aspect of the L3 vertebral body. There is also sclerosis involving the anterior aspect of the T12 vertebral body right of midline. Multilevel bridging osteophytosis is present in the visualized lower thoracic spine. Multilevel degenerative disc disease and facet arthrosis are noted in the mid and lower lumbar spine.  IMPRESSION: 1. No etiology of abdominal pain identified. 2. Unchanged cystic lesion anterior  to the left iliacus muscle. 3. Sclerotic lesions within the T12 and L3 vertebral bodies, concerning for metastases. These are new from 2008, although the L3 lesion was described on a CT from 10/11/2010 (images from this study not available for review at the time of dictation).   Electronically Signed   By: Sebastian Ache    On: 10/19/2013 11:49   Nm Bone Scan Whole Body  11/04/2013   CLINICAL DATA:  History of breast cancer.  Left hip pain.  EXAM: NUCLEAR MEDICINE WHOLE BODY BONE SCAN  TECHNIQUE: Whole body anterior and posterior images were obtained approximately 3 hours after intravenous injection of radiopharmaceutical.  COMPARISON:  10/06/2010  RADIOPHARMACEUTICALS:  25 mCi of Technetium-99 MDP  FINDINGS: Again noted are abnormal foci of increased uptake within the lower sternum, T12 vertebra, and L4 vertebra. These are compatible with stable bone metastasis. The patient is status post left total hip arthroplasty. There has been resolution of previous abnormal uptake within the proximal left femur. Degenerative type changes are again noted involving both sternoclavicular joints. There is physiologic tracer activity in the kidneys an urinary bladder.  IMPRESSION: 1. Stable multi focal areas of bone metastasis. 2. No new or progressive disease identified.   Electronically Signed   By: Signa Kell M.D.   On: 11/04/2013 15:34    Medications: I have reviewed the patient's current medications.  Assessment/Plan: 1. Breast cancer, right-sided breast cancer (T2 N2) diagnosed in 2009, status post a right lumpectomy and adjuvant radiation. She was diagnosed with a pathologic left femur fracture in November 2011. A staging bone scan and CT scan in November 2011 confirmed multiple bone metastases and a cystic metastasis at the left iliac muscle. No visceral metastases were seen. She began Arimidex following an office visit 11/07/2010. The Arimidex was held for approximately 1 week after an office visit on 02/09/2011 and then resumed on 02/16/2011.  a. History of elevated CA 27.29.  Normal on 09/21/2013. b. Bone scan 11/04/2013 with stable multifocal areas of bone metastasis. No new or progressive disease identified. 2. Status post a left hip hemiarthroplasty for treatment of a pathologic fracture 10/07/2010. 3. Status post  radiation to the left proximal femur 12/22 through 11/17/2010. 4. Diabetes. 5. Hypertension. 6. History of arthralgias, likely related to degenerative arthritis 7. Leukocytosis/lymphocytosis-chronic. Review the peripheral blood smear from 02/01/2012 was consistent with a diagnosis of chronic lymphocytic leukemia. A peripheral blood sample was submitted for flow cytometry today on July 20 13,013. This confirmed a diagnosis of chronic lymphatic leukemia. 8. Recurrent "bladder "pain-relieved with ciprofloxacin, it is not clear the pain is related to urinary tract infection, but she insists the pain is relieved with ciprofloxacin. 9. Left abdomen/iliac pain-etiology unclear. Question "bursitis". 10. Cystic left pelvic lesion. Present on x-ray studies dating back to 2002.   Dispositon-she appears stable. The cystic left pelvic lesion has been present on x-ray studies dating to 2002 and the recent bone scan showed stable bone metastases with no new or progressive disease. The intermittent pain she is experiencing at the left iliac region is most likely unrelated to metastatic breast cancer.  She will continue Arimidex. We will see her as scheduled in May 2015. She will contact the office in the interim with any problems. We specifically discussed increased pain (frequency or severity). She and her daughter expressed their understanding.  Plan reviewed with Dr. Truett Perna.   Lonna Cobb ANP/GNP-BC

## 2013-11-12 ENCOUNTER — Other Ambulatory Visit: Payer: Self-pay | Admitting: Oncology

## 2013-11-18 ENCOUNTER — Telehealth: Payer: Self-pay | Admitting: Cardiology

## 2013-11-18 NOTE — Telephone Encounter (Signed)
Stop Exforge.  Start amlodipine 10 mg one daily and start losartan 100 mg one daily.

## 2013-11-18 NOTE — Telephone Encounter (Signed)
Exforge has gone way up in price (over $200 a month) and would like to change. Will forward to  Dr. Mare Ferrari for review

## 2013-11-18 NOTE — Telephone Encounter (Signed)
New Problem:  Pt is calling to see what her other options are besides exforge Rx.. Pt would liek a call back. Pt states it costs too much.

## 2013-11-20 NOTE — Telephone Encounter (Signed)
Advised patient and she will call back when she needs medication.

## 2013-12-07 ENCOUNTER — Telehealth: Payer: Self-pay | Admitting: Cardiology

## 2013-12-07 DIAGNOSIS — I119 Hypertensive heart disease without heart failure: Secondary | ICD-10-CM

## 2013-12-07 MED ORDER — LOSARTAN POTASSIUM 100 MG PO TABS: 100.0000 mg | ORAL_TABLET | Freq: Every day | ORAL | Status: DC

## 2013-12-07 MED ORDER — AMLODIPINE BESYLATE 10 MG PO TABS: 10.0000 mg | ORAL_TABLET | Freq: Every day | ORAL | Status: DC

## 2013-12-07 NOTE — Telephone Encounter (Signed)
Advised patient           Molly Coco, MD at 11/18/2013 5:51 PM    Status: Signed        Stop Exforge.  Start amlodipine 10 mg one daily and start losartan 100 mg one daily.        [name] at 11/18/2013 5:38 PM    Status: Signed        Exforge has gone way up in price (over $200 a month) and would like to change. Will forward to Dr. Mare Ferrari for review        [name] at 11/18/2013 3:11 PM    Status: Signed        New Problem:  Pt is calling to see what her other options are besides exforge Rx.. Pt would liek a call back. Pt states it costs too much.

## 2013-12-07 NOTE — Telephone Encounter (Signed)
F/u     previous message.

## 2013-12-07 NOTE — Telephone Encounter (Signed)
New message     Finishing exford medication---want to know what will Dr Mare Ferrari want her to now take

## 2014-01-15 ENCOUNTER — Other Ambulatory Visit: Payer: Self-pay | Admitting: Family Medicine

## 2014-01-15 DIAGNOSIS — Z9889 Other specified postprocedural states: Secondary | ICD-10-CM

## 2014-01-15 DIAGNOSIS — Z853 Personal history of malignant neoplasm of breast: Secondary | ICD-10-CM

## 2014-02-13 ENCOUNTER — Other Ambulatory Visit: Payer: Self-pay | Admitting: Cardiology

## 2014-02-16 ENCOUNTER — Telehealth: Payer: Self-pay | Admitting: *Deleted

## 2014-02-16 ENCOUNTER — Other Ambulatory Visit: Payer: Self-pay | Admitting: Cardiology

## 2014-02-16 NOTE — Telephone Encounter (Signed)
Okay to refill? 

## 2014-02-16 NOTE — Telephone Encounter (Signed)
Will forward to  Dr. Brackbill for review 

## 2014-02-16 NOTE — Telephone Encounter (Signed)
Patient requests exforge refill. I see where she was told to stop taking it, but she said that she cannot take those other meds and is going to continue the exforge. Ok to refill? Please advise. Thanks, MI

## 2014-02-17 ENCOUNTER — Other Ambulatory Visit: Payer: Self-pay

## 2014-02-17 MED ORDER — AMLODIPINE BESYLATE-VALSARTAN 10-320 MG PO TABS: 1.0000 | ORAL_TABLET | Freq: Every day | ORAL | Status: DC

## 2014-02-18 NOTE — Telephone Encounter (Signed)
Patient aware. Took the amlodipine and losartan off medication list.

## 2014-03-01 ENCOUNTER — Ambulatory Visit: Payer: PRIVATE HEALTH INSURANCE

## 2014-03-05 ENCOUNTER — Ambulatory Visit
Admission: RE | Admit: 2014-03-05 | Discharge: 2014-03-05 | Disposition: A | Discharge status: Home / Self Care | Payer: PRIVATE HEALTH INSURANCE | Source: Ambulatory Visit | Attending: Family Medicine | Admitting: Family Medicine

## 2014-03-05 ENCOUNTER — Encounter (INDEPENDENT_AMBULATORY_CARE_PROVIDER_SITE_OTHER): Payer: Self-pay

## 2014-03-05 DIAGNOSIS — Z853 Personal history of malignant neoplasm of breast: Secondary | ICD-10-CM

## 2014-03-05 DIAGNOSIS — Z9889 Other specified postprocedural states: Secondary | ICD-10-CM

## 2014-03-10 ENCOUNTER — Encounter: Payer: Self-pay | Admitting: Cardiology

## 2014-03-10 ENCOUNTER — Ambulatory Visit (INDEPENDENT_AMBULATORY_CARE_PROVIDER_SITE_OTHER): Payer: PRIVATE HEALTH INSURANCE | Admitting: Cardiology

## 2014-03-10 VITALS — BP 126/66 | HR 71 | Ht 65.0 in | Wt 207.0 lb

## 2014-03-10 DIAGNOSIS — R011 Cardiac murmur, unspecified: Secondary | ICD-10-CM

## 2014-03-10 DIAGNOSIS — E1165 Type 2 diabetes mellitus with hyperglycemia: Secondary | ICD-10-CM

## 2014-03-10 DIAGNOSIS — E7800 Pure hypercholesterolemia, unspecified: Secondary | ICD-10-CM

## 2014-03-10 DIAGNOSIS — IMO0001 Reserved for inherently not codable concepts without codable children: Secondary | ICD-10-CM

## 2014-03-10 DIAGNOSIS — R0989 Other specified symptoms and signs involving the circulatory and respiratory systems: Secondary | ICD-10-CM

## 2014-03-10 DIAGNOSIS — I119 Hypertensive heart disease without heart failure: Secondary | ICD-10-CM

## 2014-03-10 MED ORDER — ATENOLOL 25 MG PO TABS: 25.0000 mg | ORAL_TABLET | Freq: Two times a day (BID) | ORAL | Status: DC

## 2014-03-10 NOTE — Assessment & Plan Note (Signed)
Recent carotid duplex showed no significant stenosis.  She has not been having any TIA or stroke symptoms.

## 2014-03-10 NOTE — Assessment & Plan Note (Signed)
The patient has not been having any chest pain or increased shortness of breath.  She does have chronic mild dyspnea.  She is sedentary.  She came by wheelchair today.  At home she uses a walker.

## 2014-03-10 NOTE — Assessment & Plan Note (Signed)
The patient has a history of hypercholesterolemia.  Her lipids are followed by her PCP.  She is trying to watch her diet.  Her weight is down 5 pounds since last visit.

## 2014-03-10 NOTE — Progress Notes (Signed)
Molly [name] Date of Birth:  19-Oct-1923 [facility] [address] [place], [place]  [address] [phone]        Fax   [phone]   History of Present Illness: This 80 year old woman is seen for a scheduled followup office visit. She has a past history of essential hypertension and hypercholesterolemia. She also has a history of aortic valve sclerosis. She has had a history of arrhythmia with PVCs. She has a history of breast cancer with metastasis to bone and she had a previous pathologic fracture of her left hip. He does not have any history of ischemic heart disease and she had a normal nuclear stress test in 2009. She was told many years ago by her former PCP Dr. Conley Canal that she had a small abdominal aortic aneurysm. She has a history of diabetes and is followed by Dr. Buddy Duty.  Since last visit she has not had any new cardiac symptoms. She has noted some blurring of her vision which she attributes to her blood pressure being high and her blood sugar being high. Her last echocardiogram was 10/20/03 and at that time had an ejection fraction of 55-65% with LVH and with small degree of left ventricular outflow tract dynamic obstruction.  She had carotid Doppler ultrasound on 09/16/13 which showed no significant carotid artery stenosis but she did have a right thyroid nodule.  She has a known goiter.  The patient had an echocardiogram on 09/15/13 showing mild aortic stenosis with peak gradient of 19 and a mean gradient of 12 and her ejection fraction was 55-60%.   Current Outpatient Prescriptions  Medication Sig Dispense Refill  . amLODipine-valsartan (EXFORGE) 10-320 MG per tablet Take 1 tablet by mouth daily.  30 tablet  6  . anastrozole (ARIMIDEX) 1 MG tablet TAKE 1 TABLET BY MOUTH EVERY DAY  30 tablet  4  . aspirin EC 81 MG tablet Take 81 mg by mouth daily.      Marland Kitchen atenolol (TENORMIN) 25 MG tablet Take 1 tablet (25 mg total) by mouth 2 (two) times daily.  60 tablet   11  . Calcium Carbonate-Vitamin D (CALCIUM + D PO) Take by mouth daily.        . cholecalciferol (VITAMIN D) 1000 UNITS tablet Take 1,000 Units by mouth daily.      . ciprofloxacin (CIPRO) 250 MG tablet Take 250 mg by mouth daily as needed (for urinary symptoms).      . Cyanocobalamin (VITAMIN B-12 PO) Take by mouth daily.        . fluticasone (FLONASE) 50 MCG/ACT nasal spray Place 2 sprays into the nose daily as needed.       Marland Kitchen glimepiride (AMARYL) 2 MG tablet Take 2 mg by mouth 2 (two) times daily.       . Glucose Blood (FREESTYLE LITE TEST VI)       . KLOR-CON M10 10 MEQ tablet TAKE 1 TABLET (10 MEQ TOTAL) BY MOUTH DAILY.  30 tablet  8  . levothyroxine (SYNTHROID, LEVOTHROID) 75 MCG tablet Take 75 mcg by mouth daily.        . metFORMIN (GLUCOPHAGE) 500 MG tablet Take 500 mg by mouth 2 (two) times daily with a meal.        . pantoprazole (PROTONIX) 40 MG tablet Take 40 mg by mouth as needed (for reflux).       . traMADol (ULTRAM) 50 MG tablet Take 0.5 tablets (25 mg total) by mouth every 6 (  six) hours as needed.  30 tablet  0  . ZETIA 10 MG tablet Take 10 mg by mouth Daily.        No current facility-administered medications for this visit.    Allergies  Allergen Reactions  . Codeine   . Demerol   . Librium   . Lipitor [Atorvastatin Calcium]   . Metoprolol     Patient Active Problem List   Diagnosis Date Noted  . Carotid artery bruit 08/28/2013  . Heart murmur, systolic 21/19/4174  . RBBB 02/16/2013  . Type II or unspecified type diabetes mellitus without mention of complication, uncontrolled 02/16/2013  . Benign hypertensive heart disease without heart failure 08/02/2011  . Pure hypercholesterolemia 08/02/2011  . Asymptomatic PVCs 08/02/2011  . Breast cancer metastasized to bone 08/02/2011    History  Smoking status  . Never Smoker   Smokeless tobacco  . Not on file    History  Alcohol Use No    Family History  Problem Relation Age of Onset  . Hypertension  Mother   . Heart attack Father   . Hypertension Father   . Diabetes Sister   . Hypertension Sister   . Liver cancer Sister   . Heart attack Brother   . Hypertension Brother   . Diabetes Brother   . Diabetes Sister   . Hypertension Sister     Review of Systems: Constitutional: no fever chills diaphoresis or fatigue or change in weight.  Head and neck: no hearing loss, no epistaxis, no photophobia or visual disturbance. Respiratory: No cough, shortness of breath or wheezing. Cardiovascular: No chest pain peripheral edema, palpitations. Gastrointestinal: No abdominal distention, no abdominal pain, no change in bowel habits hematochezia or melena. Genitourinary: No dysuria, no frequency, no urgency, no nocturia. Musculoskeletal:No arthralgias, no back pain, no gait disturbance or myalgias. Neurological: No dizziness, no headaches, no numbness, no seizures, no syncope, no weakness, no tremors. Hematologic: No lymphadenopathy, no easy bruising. Psychiatric: No confusion, no hallucinations, no sleep disturbance.    Physical Exam: Filed Vitals:   03/10/14 1356  BP: 126/66  Pulse: 71   the general appearance reveals a well-developed mildly overweight woman in a wheelchair.  She is in no acute distress.The head and neck exam reveals pupils equal and reactive.  Extraocular movements are full.  There is no scleral icterus.  The mouth and pharynx are normal.  The neck is supple.  The carotids reveal a soft left carotid bruit.  The jugular venous pressure is normal.  The  thyroid is not enlarged.  There is no lymphadenopathy.  The chest is clear to percussion and auscultation.  There are no rales or rhonchi.  Expansion of the chest is symmetrical.  The precordium is quiet.  The first heart sound is normal.  The second heart sound is physiologically split.  There is no  gallop rub or click.  There is a soft systolic ejection murmur at the base.  No diastolic murmur.  There is no abnormal lift or  heave.  The abdomen is soft and nontender.  The bowel sounds are normal.  The liver and spleen are not enlarged.  There are no abdominal masses.  There are no abdominal bruits.  Extremities reveal good pedal pulses.  There is no phlebitis or edema.  There is no cyanosis or clubbing.  Strength is normal and symmetrical in all extremities.  There is no lateralizing weakness.  There are no sensory deficits.  The skin is warm and dry.  There is  no rash.  EKG today shows normal sinus rhythm with a right bundle branch block unchanged from 02/16/13.   Assessment / Plan: 1. benign hypertension without congestive heart failure 2. left carotid bruit 3. Goiter 4. mild aortic stenosis 5. type 2 diabetes mellitus without mention of complication, uncontrolled 6. breast cancer with prior history of metastasis to bone  Plan: Continue on careful heart healthy diet.  Continue same medication.  Recheck in 6 months for followup office visit.

## 2014-03-10 NOTE — Patient Instructions (Signed)
Your physician recommends that you continue on your current medications as directed. Please refer to the Current Medication list given to you today.  Your physician wants you to follow-up in: 6 month ov You will receive a reminder letter in the mail two months in advance. If you don't receive a letter, please call our office to schedule the follow-up appointment.  

## 2014-03-22 ENCOUNTER — Telehealth: Payer: Self-pay | Admitting: Oncology

## 2014-03-22 ENCOUNTER — Telehealth: Payer: Self-pay | Admitting: *Deleted

## 2014-03-22 NOTE — Telephone Encounter (Signed)
S/w the pt and she is aware of her r/s appts to June per pt's request

## 2014-03-22 NOTE — Telephone Encounter (Signed)
Message from pt requesting to reschedule office visit to week of 5/19 due to transportation issues. Pt requests late afternoon appts. Order sent to schedulers.

## 2014-03-23 ENCOUNTER — Other Ambulatory Visit: Payer: PRIVATE HEALTH INSURANCE

## 2014-03-23 ENCOUNTER — Ambulatory Visit: Payer: PRIVATE HEALTH INSURANCE | Admitting: Nurse Practitioner

## 2014-04-01 ENCOUNTER — Other Ambulatory Visit: Payer: PRIVATE HEALTH INSURANCE

## 2014-04-01 ENCOUNTER — Ambulatory Visit: Payer: PRIVATE HEALTH INSURANCE | Admitting: Nurse Practitioner

## 2014-04-02 ENCOUNTER — Ambulatory Visit: Payer: PRIVATE HEALTH INSURANCE | Admitting: Nurse Practitioner

## 2014-04-09 ENCOUNTER — Ambulatory Visit: Payer: PRIVATE HEALTH INSURANCE | Admitting: Nurse Practitioner

## 2014-04-09 ENCOUNTER — Other Ambulatory Visit: Payer: Self-pay | Admitting: Oncology

## 2014-04-13 ENCOUNTER — Other Ambulatory Visit (HOSPITAL_BASED_OUTPATIENT_CLINIC_OR_DEPARTMENT_OTHER): Payer: PRIVATE HEALTH INSURANCE

## 2014-04-13 ENCOUNTER — Telehealth: Payer: Self-pay | Admitting: Oncology

## 2014-04-13 ENCOUNTER — Ambulatory Visit (HOSPITAL_BASED_OUTPATIENT_CLINIC_OR_DEPARTMENT_OTHER): Payer: PRIVATE HEALTH INSURANCE | Admitting: Nurse Practitioner

## 2014-04-13 VITALS — BP 166/57 | HR 73 | Temp 98.9°F | Resp 18 | Ht 65.0 in | Wt 206.9 lb

## 2014-04-13 DIAGNOSIS — C7951 Secondary malignant neoplasm of bone: Secondary | ICD-10-CM

## 2014-04-13 DIAGNOSIS — C779 Secondary and unspecified malignant neoplasm of lymph node, unspecified: Secondary | ICD-10-CM

## 2014-04-13 DIAGNOSIS — R5383 Other fatigue: Secondary | ICD-10-CM

## 2014-04-13 DIAGNOSIS — I10 Essential (primary) hypertension: Secondary | ICD-10-CM

## 2014-04-13 DIAGNOSIS — R5381 Other malaise: Secondary | ICD-10-CM

## 2014-04-13 DIAGNOSIS — D649 Anemia, unspecified: Secondary | ICD-10-CM

## 2014-04-13 DIAGNOSIS — C50919 Malignant neoplasm of unspecified site of unspecified female breast: Secondary | ICD-10-CM

## 2014-04-13 DIAGNOSIS — C9110 Chronic lymphocytic leukemia of B-cell type not having achieved remission: Secondary | ICD-10-CM

## 2014-04-13 DIAGNOSIS — C7952 Secondary malignant neoplasm of bone marrow: Secondary | ICD-10-CM

## 2014-04-13 DIAGNOSIS — R61 Generalized hyperhidrosis: Secondary | ICD-10-CM

## 2014-04-13 DIAGNOSIS — R195 Other fecal abnormalities: Secondary | ICD-10-CM

## 2014-04-13 DIAGNOSIS — C50911 Malignant neoplasm of unspecified site of right female breast: Secondary | ICD-10-CM

## 2014-04-13 LAB — CBC WITH DIFFERENTIAL/PLATELET
BASO%: 1 % (ref 0.0–2.0)
BASOS ABS: 0.2 10*3/uL — AB (ref 0.0–0.1)
EOS%: 1.1 % (ref 0.0–7.0)
Eosinophils Absolute: 0.3 10*3/uL (ref 0.0–0.5)
HEMATOCRIT: 32.3 % — AB (ref 34.8–46.6)
HEMOGLOBIN: 10.1 g/dL — AB (ref 11.6–15.9)
LYMPH#: 10.3 10*3/uL — AB (ref 0.9–3.3)
LYMPH%: 43.8 % (ref 14.0–49.7)
MCH: 25.7 pg (ref 25.1–34.0)
MCHC: 31.3 g/dL — AB (ref 31.5–36.0)
MCV: 82.3 fL (ref 79.5–101.0)
MONO#: 0.9 10*3/uL (ref 0.1–0.9)
MONO%: 3.9 % (ref 0.0–14.0)
NEUT#: 11.8 10*3/uL — ABNORMAL HIGH (ref 1.5–6.5)
NEUT%: 50.2 % (ref 38.4–76.8)
PLATELETS: 353 10*3/uL (ref 145–400)
RBC: 3.92 10*6/uL (ref 3.70–5.45)
RDW: 14.1 % (ref 11.2–14.5)
WBC: 23.5 10*3/uL — ABNORMAL HIGH (ref 3.9–10.3)

## 2014-04-13 NOTE — Telephone Encounter (Signed)
gv pt relative appt schedule for july

## 2014-04-13 NOTE — Progress Notes (Addendum)
[facility] OFFICE PROGRESS NOTE   Diagnosis:  Breast cancer and CLL.  INTERVAL HISTORY:   Molly [name] returns as scheduled. Her main complaint is feeling weak and tired; no energy. She has felt this way for "a long time". She no longer has pain at the left iliac region. She has intermittent bilateral hip pain. Weight is stable. Blood sugars continue to fluctuate. No fevers. Intermittent drenching night sweats. No sweats during the day. She denies bleeding. Specifically no bloody or black stools. Over the past month she has noted increased diarrhea. No nausea or vomiting. No dysphagia.  Objective:  Vital signs in last 24 hours:  Blood pressure 166/57, pulse 73, temperature 98.9 F (37.2 C), temperature source Oral, resp. rate 18, height 5\' 5"  (1.651 m), weight 206 lb 14.4 oz (93.849 kg), SpO2 100.00%.    HEENT: No thrush or ulcerations. Lymphatics: No palpable cervical, supraclavicular or axillary lymph nodes. Resp: Lungs clear. Cardio: Regular cardiac rhythm. GI: Abdomen soft and nontender. No hepatomegaly. No rectal mass. Stool brown, guaiac positive. Vascular: No leg edema.  Breast: Status post right lumpectomy. No mass palpated in either breast.   Lab Results:  Lab Results  Component Value Date   WBC 23.5* 04/13/2014   HGB 10.1* 04/13/2014   HCT 32.3* 04/13/2014   MCV 82.3 04/13/2014   PLT 353 04/13/2014   NEUTROABS 11.8* 04/13/2014    Imaging:  Bilateral mammogram 03/06/2014. No evidence for malignancy.  Medications: I have reviewed the patient's current medications.  Assessment/Plan: 1. Breast cancer, right-sided breast cancer (T2 N2) diagnosed in 2009, status post a right lumpectomy and adjuvant radiation. She was diagnosed with a pathologic left femur fracture in November 2011. A staging bone scan and CT scan in November 2011 confirmed multiple bone metastases and a cystic metastasis at the left iliac muscle. No visceral metastases were seen. She began  Arimidex following an office visit 11/07/2010. The Arimidex was held for approximately 1 week after an office visit on 02/09/2011 and then resumed on 02/16/2011.  a. History of elevated CA 27-29. The CA 27.29 was normal on 09/21/2013. b. Bone scan 11/04/2013 with stable multifocal areas of bone metastasis. No new or progressive disease identified. 2. Status post a left hip hemiarthroplasty for treatment of a pathologic fracture 10/07/2010. 3. Status post radiation to the left proximal femur 12/22 through 11/17/2010. 4. Diabetes. 5. Hypertension. 6. History of arthralgias, likely related to degenerative arthritis 7. Leukocytosis/lymphocytosis-chronic. Review the peripheral blood smear from 02/01/2012 was consistent with a diagnosis of chronic lymphocytic leukemia. A peripheral blood sample was submitted for flow cytometry on 06/03/2012. This confirmed a diagnosis of chronic lymphocytic leukemia. 8. Recurrent "bladder "pain-relieved with ciprofloxacin, it is not clear the pain is related to urinary tract infection, but she insists the pain is relieved with ciprofloxacin. 9. Left abdomen/iliac pain-etiology unclear. 10. Cystic left pelvic lesion. Present on x-ray studies dating to 2002. 11. Anemia 04/13/2014. 12. Heme positive stool 04/13/2014. 13. Night sweats. Question related to CLL, question diabetes.   Disposition: Molly [name] returns for scheduled followup. She continues Arimidex. We will followup on the CA 27-29 from today.  She is noted to have a new anemia on labs today. Question related to CLL, question related to breast cancer, question blood loss. Stool was Hemoccult positive today. We discussed a referral for GI evaluation. We discussed her age and comorbidities. She will return for followup labs in approximately 2 months prior to deciding on GI evaluation.  She and her daughter  understand to contact the office in the interim if any bleeding is noted or if there is a change in her  overall condition.   Patient seen with Dr. Benay Spice. 25 minutes were spent face-to-face at today's visit that the majority of the time involved in counseling/coordination of care.   Owens Shark ANP/GNP-BC   04/13/2014  4:43 PM  This was shared visit with Ned Card. Molly [name] has metastatic breast cancer and CLL. She has progressive anemia. The stool was Hemoccult positive. She will return for an office visit and CBC in 2 months. She will contact us for bleeding. We will recommend a GI referral if the hemoglobin falls further.  I have a low clinical suspicion for progression of the breast cancer. The intermittent "night sweats "may be related to diabetes. No other evidence for progression of the CLL.  Julieanne Manson, M.D.

## 2014-04-14 LAB — CANCER ANTIGEN 27.29: CA 27.29: 18 U/mL (ref 0–39)

## 2014-04-15 ENCOUNTER — Telehealth: Payer: Self-pay | Admitting: *Deleted

## 2014-04-15 NOTE — Telephone Encounter (Signed)
Message copied by Norma Fredrickson on Thu Apr 15, 2014  9:38 AM ------      Message from: Betsy Coder B      Created: Wed Apr 14, 2014  7:04 PM       Please call patient, ca27.29 is normal ------

## 2014-04-15 NOTE — Telephone Encounter (Signed)
Called and informed patient that ca27.29 is normal.  Per Dr. Benay Spice.  Patient verbalized understanding.

## 2014-04-23 ENCOUNTER — Encounter (HOSPITAL_COMMUNITY): Payer: Self-pay | Admitting: Emergency Medicine

## 2014-04-23 ENCOUNTER — Emergency Department (HOSPITAL_COMMUNITY)
Admission: EM | Admit: 2014-04-23 | Discharge: 2014-04-23 | Disposition: A | Discharge status: Home / Self Care | Payer: PRIVATE HEALTH INSURANCE | Attending: Emergency Medicine | Admitting: Emergency Medicine

## 2014-04-23 ENCOUNTER — Telehealth: Payer: Self-pay | Admitting: *Deleted

## 2014-04-23 DIAGNOSIS — R739 Hyperglycemia, unspecified: Secondary | ICD-10-CM

## 2014-04-23 DIAGNOSIS — Z853 Personal history of malignant neoplasm of breast: Secondary | ICD-10-CM | POA: Insufficient documentation

## 2014-04-23 DIAGNOSIS — IMO0002 Reserved for concepts with insufficient information to code with codable children: Secondary | ICD-10-CM | POA: Insufficient documentation

## 2014-04-23 DIAGNOSIS — Z79899 Other long term (current) drug therapy: Secondary | ICD-10-CM | POA: Insufficient documentation

## 2014-04-23 DIAGNOSIS — Z7982 Long term (current) use of aspirin: Secondary | ICD-10-CM | POA: Insufficient documentation

## 2014-04-23 DIAGNOSIS — R197 Diarrhea, unspecified: Secondary | ICD-10-CM | POA: Insufficient documentation

## 2014-04-23 DIAGNOSIS — E119 Type 2 diabetes mellitus without complications: Secondary | ICD-10-CM | POA: Insufficient documentation

## 2014-04-23 DIAGNOSIS — M129 Arthropathy, unspecified: Secondary | ICD-10-CM | POA: Insufficient documentation

## 2014-04-23 DIAGNOSIS — E039 Hypothyroidism, unspecified: Secondary | ICD-10-CM | POA: Insufficient documentation

## 2014-04-23 DIAGNOSIS — I10 Essential (primary) hypertension: Secondary | ICD-10-CM | POA: Insufficient documentation

## 2014-04-23 DIAGNOSIS — R631 Polydipsia: Secondary | ICD-10-CM | POA: Insufficient documentation

## 2014-04-23 DIAGNOSIS — R358 Other polyuria: Secondary | ICD-10-CM | POA: Insufficient documentation

## 2014-04-23 DIAGNOSIS — R3589 Other polyuria: Secondary | ICD-10-CM | POA: Insufficient documentation

## 2014-04-23 DIAGNOSIS — H538 Other visual disturbances: Secondary | ICD-10-CM | POA: Insufficient documentation

## 2014-04-23 LAB — COMPREHENSIVE METABOLIC PANEL
ALK PHOS: 98 U/L (ref 39–117)
ALT: 28 U/L (ref 0–35)
AST: 31 U/L (ref 0–37)
Albumin: 3.4 g/dL — ABNORMAL LOW (ref 3.5–5.2)
BILIRUBIN TOTAL: 0.3 mg/dL (ref 0.3–1.2)
BUN: 9 mg/dL (ref 6–23)
CHLORIDE: 94 meq/L — AB (ref 96–112)
CO2: 21 mEq/L (ref 19–32)
CREATININE: 0.62 mg/dL (ref 0.50–1.10)
Calcium: 9.5 mg/dL (ref 8.4–10.5)
GFR, EST NON AFRICAN AMERICAN: 78 mL/min — AB (ref >90)
GLUCOSE: 240 mg/dL — AB (ref 70–99)
Potassium: 3.9 mEq/L (ref 3.7–5.3)
Sodium: 133 mEq/L — ABNORMAL LOW (ref 137–147)
Total Protein: 7 g/dL (ref 6.0–8.3)

## 2014-04-23 LAB — CBC WITH DIFFERENTIAL/PLATELET
Basophils Absolute: 0 10*3/uL (ref 0.0–0.1)
Basophils Relative: 0 % (ref 0–1)
Eosinophils Absolute: 0.2 10*3/uL (ref 0.0–0.7)
Eosinophils Relative: 1 % (ref 0–5)
HEMATOCRIT: 34.2 % — AB (ref 36.0–46.0)
Hemoglobin: 10.9 g/dL — ABNORMAL LOW (ref 12.0–15.0)
LYMPHS ABS: 9.9 10*3/uL — AB (ref 0.7–4.0)
Lymphocytes Relative: 40 % (ref 12–46)
MCH: 25.6 pg — AB (ref 26.0–34.0)
MCHC: 31.9 g/dL (ref 30.0–36.0)
MCV: 80.5 fL (ref 78.0–100.0)
MONO ABS: 1 10*3/uL (ref 0.1–1.0)
MONOS PCT: 4 % (ref 3–12)
NEUTROS ABS: 13.7 10*3/uL — AB (ref 1.7–7.7)
Neutrophils Relative %: 55 % (ref 43–77)
PLATELETS: 494 10*3/uL — AB (ref 150–400)
RBC: 4.25 MIL/uL (ref 3.87–5.11)
RDW: 13.7 % (ref 11.5–15.5)
WBC: 24.8 10*3/uL — AB (ref 4.0–10.5)

## 2014-04-23 LAB — URINALYSIS, ROUTINE W REFLEX MICROSCOPIC
Bilirubin Urine: NEGATIVE
GLUCOSE, UA: NEGATIVE mg/dL
Hgb urine dipstick: NEGATIVE
Ketones, ur: NEGATIVE mg/dL
LEUKOCYTES UA: NEGATIVE
Nitrite: NEGATIVE
PH: 6 (ref 5.0–8.0)
PROTEIN: NEGATIVE mg/dL
SPECIFIC GRAVITY, URINE: 1.011 (ref 1.005–1.030)
Urobilinogen, UA: 0.2 mg/dL (ref 0.0–1.0)

## 2014-04-23 LAB — TROPONIN I: Troponin I: <0.3 ng/mL (ref <0.30)

## 2014-04-23 LAB — CBG MONITORING, ED
Glucose-Capillary: 245 mg/dL — ABNORMAL HIGH (ref 70–99)
Glucose-Capillary: 183 mg/dL — ABNORMAL HIGH (ref 70–99)

## 2014-04-23 MED ORDER — SODIUM CHLORIDE 0.9 % IV BOLUS (SEPSIS)
500.0000 mL | Freq: Once | INTRAVENOUS | Status: AC
  Administered 2014-04-23: 500 mL via INTRAVENOUS

## 2014-04-23 MED ORDER — SODIUM CHLORIDE 0.9 % IV SOLN
INTRAVENOUS | Status: DC
  Administered 2014-04-23: 12:00:00 via INTRAVENOUS

## 2014-04-23 MED ORDER — SODIUM CHLORIDE 0.9 % IV BOLUS (SEPSIS)
250.0000 mL | Freq: Once | INTRAVENOUS | Status: AC
  Administered 2014-04-23: 250 mL via INTRAVENOUS

## 2014-04-23 NOTE — ED Provider Notes (Addendum)
CSN: [id number]     Arrival date & time 04/23/14  1040 History   First MD Initiated Contact with Patient 04/23/14 1111     Chief Complaint  Patient presents with  . Diarrhea  . Hypertension     (Consider location/radiation/quality/duration/timing/severity/associated sxs/prior Treatment) Patient is a 80 y.o. female presenting with diarrhea and hypertension. The history is provided by the patient.  Diarrhea Hypertension   patient here complaining of blurred vision, polyuria and polydipsia x1 week. She's had 3 weeks of chronic watery diarrhea which is become worse after she took amoxicillin. She states that her diarrhea preceded her take amoxicillin. No fever or chills. She denies any headache or focal deficits. No abdominal pain. But sugars at home up in the 200s. She also notes her blood pressure is elevated at home with her systolics were in the low 200s. He denies any headache or chest pain. No shortness of breath. Symptoms persistent and nothing makes them better or worse. No treatment used prior to arrival.  Past Medical History  Diagnosis Date  . Diabetes mellitus   . Hypertension   . Arthritis   . MVP (mitral valve prolapse)   . Hx of breast cancer     RIGHT BREAST  . Hypothyroidism    Past Surgical History  Procedure Laterality Date  . Hemiarthroplasty hip    . Knee arthroscopy      RIGHT KNEE  . Total abdominal hysterectomy w/ bilateral salpingoophorectomy    . Pelvic and para-aortic lymph node dissection    . Tonsillectomy    . Appendectomy    . Cholecystectomy    . US echocardiography  03/30/2009    EF 55-60%  . Cardiovascular stress test  01/28/2008    EF 74%   Family History  Problem Relation Age of Onset  . Hypertension Mother   . Heart attack Father   . Hypertension Father   . Diabetes Sister   . Hypertension Sister   . Liver cancer Sister   . Heart attack Brother   . Hypertension Brother   . Diabetes Brother   . Diabetes Sister   . Hypertension Sister     History  Substance Use Topics  . Smoking status: Never Smoker   . Smokeless tobacco: Not on file  . Alcohol Use: No   OB History   Grav Para Term Preterm Abortions TAB SAB Ect Mult Living                 Review of Systems  Gastrointestinal: Positive for diarrhea.  All other systems reviewed and are negative.     Allergies  Codeine; Demerol; Librium; Lipitor; and Metoprolol  Home Medications   Prior to Admission medications   Medication Sig Start Date End Date Taking? Authorizing Provider  amLODipine-valsartan (EXFORGE) 10-320 MG per tablet Take 1 tablet by mouth daily. 02/17/14   Sueanne Margarita, MD  anastrozole (ARIMIDEX) 1 MG tablet TAKE 1 TABLET BY MOUTH EVERY DAY    Ladell Pier, MD  aspirin EC 81 MG tablet Take 81 mg by mouth daily.    Historical Provider, MD  atenolol (TENORMIN) 25 MG tablet Take 1 tablet (25 mg total) by mouth 2 (two) times daily. 03/10/14   Darlin Coco, MD  Calcium Carbonate-Vitamin D (CALCIUM + D PO) Take by mouth daily.      Historical Provider, MD  cholecalciferol (VITAMIN D) 1000 UNITS tablet Take 1,000 Units by mouth daily.    Historical Provider, MD  ciprofloxacin (CIPRO) 250  MG tablet Take 250 mg by mouth daily as needed (for urinary symptoms). 09/22/12   Ladell Pier, MD  Cyanocobalamin (VITAMIN B-12 PO) Take by mouth daily.      Historical Provider, MD  fluticasone (FLONASE) 50 MCG/ACT nasal spray Place 2 sprays into the nose daily as needed.  11/26/11   Historical Provider, MD  glimepiride (AMARYL) 2 MG tablet Take 2 mg by mouth 2 (two) times daily.     Historical Provider, MD  Glucose Blood (FREESTYLE LITE TEST VI)  08/27/13   Historical Provider, MD  KLOR-CON M10 10 MEQ tablet TAKE 1 TABLET (10 MEQ TOTAL) BY MOUTH DAILY. 06/10/13   Darlin Coco, MD  levothyroxine (SYNTHROID, LEVOTHROID) 75 MCG tablet Take 75 mcg by mouth daily.      Historical Provider, MD  metFORMIN (GLUCOPHAGE) 500 MG tablet Take 500 mg by mouth 2 (two) times  daily with a meal.      Historical Provider, MD  pantoprazole (PROTONIX) 40 MG tablet Take 40 mg by mouth as needed (for reflux).     Historical Provider, MD  traMADol (ULTRAM) 50 MG tablet Take 0.5 tablets (25 mg total) by mouth every 6 (six) hours as needed. 10/22/13   Ladell Pier, MD  ZETIA 10 MG tablet Take 10 mg by mouth Daily.  01/31/12   Historical Provider, MD   BP 166/59  Pulse 73  Temp(Src) 98.6 F (37 C) (Oral)  Resp 20  SpO2 93% Physical Exam  Nursing note and vitals reviewed. Constitutional: She is oriented to person, place, and time. She appears well-developed and well-nourished.  Non-toxic appearance. No distress.  HENT:  Head: Normocephalic and atraumatic.  Eyes: Conjunctivae, EOM and lids are normal. Pupils are equal, round, and reactive to light.  Neck: Normal range of motion. Neck supple. No tracheal deviation present. No mass present.  Cardiovascular: Normal rate, regular rhythm and normal heart sounds.  Exam reveals no gallop.   No murmur heard. Pulmonary/Chest: Effort normal and breath sounds normal. No stridor. No respiratory distress. She has no decreased breath sounds. She has no wheezes. She has no rhonchi. She has no rales.  Abdominal: Soft. Normal appearance and bowel sounds are normal. She exhibits no distension. There is no tenderness. There is no rebound and no CVA tenderness.  Musculoskeletal: Normal range of motion. She exhibits no edema and no tenderness.  Neurological: She is alert and oriented to person, place, and time. She has normal strength. No cranial nerve deficit or sensory deficit. GCS eye subscore is 4. GCS verbal subscore is 5. GCS motor subscore is 6.  Skin: Skin is warm and dry. No abrasion and no rash noted.  Psychiatric: She has a normal mood and affect. Her speech is normal and behavior is normal.    ED Course  Procedures (including critical care time) Labs Review Labs Reviewed  CBG MONITORING, ED - Abnormal; Notable for the  following:    Glucose-Capillary 245 (*)    All other components within normal limits  URINE CULTURE  CBC WITH DIFFERENTIAL  COMPREHENSIVE METABOLIC PANEL  URINALYSIS, ROUTINE W REFLEX MICROSCOPIC  TROPONIN I    Imaging Review No results found.   EKG Interpretation   Date/Time:  Friday April 23 2014 11:28:15 EDT Ventricular Rate:  72 PR Interval:  169 QRS Duration: 152 QT Interval:  471 QTC Calculation: 515 R Axis:   -36 Text Interpretation:  Sinus rhythm Right bundle branch block No  significant change since last tracing Confirmed by Zenia Resides  MD, Necha Harries  ([id number]) on 04/23/2014 2:52:28 PM      MDM   Final diagnoses:  None    Patient given IV fluids here and blood sugar has stabilized down to 183. She is mildly hypertensive with systolic blood pressure was 175. She denies any anginal type symptoms. She states she's been noncompliant with her diabetic diet. I have instructed her to followup with her primary care doctor to have her blood pressure medication adjusted.    Leota Jacobsen, MD 04/23/14 1453  Leota Jacobsen, MD 04/23/14 [phone]

## 2014-04-23 NOTE — Progress Notes (Signed)
  CARE MANAGEMENT ED NOTE 04/23/2014  Patient:  Molly [name], Molly [name]   Account Number:  [id number]  Date Initiated:  04/23/2014  Documentation initiated by:  Livia Snellen  Subjective/Objective Assessment:   Patient presents to Ed with high blood pressure and diarrhea.     Subjective/Objective Assessment Detail:     Action/Plan:   Action/Plan Detail:   Anticipated DC Date:  04/23/2014     Status Recommendation to Physician:   Result of Recommendation:    Other ED Services  Consult [facility]  Other    Choice offered to / List presented to:  C-4 Adult Children          Status of service:  Completed, signed off  ED Comments:   ED Comments Detail:  EDCM spoke to patient and her daughetr Molly [name] at bedside. Patient reports she lives at home alone and her daughter lives down the road.  Patient does not have any home health services at this time. Patient has a walker, cane, wheelchair and bedside commode at home.  Patient reports she is able to complete her ADL's without difficulty. Patient feels she does not require home health services at this time.  EDCM provided patient's daughter with a list of home health agencies in [facility], and a printed list of private duty agencies.  EDCM explained that with home health, the patient may receive a visiting RN, PT, OT aide and social worker if needed.  Aslo explained that private duty nursing would be an out of pocket expense.  all printed materials given to patient's daughter.  EDCM explained that is patient does not want home health services now, she may do so through her pcp.  Patient and patient's daughter thankful for resources.  No further EDCM needs at this time.

## 2014-04-23 NOTE — Telephone Encounter (Signed)
Patient's daughter [name] called reporting she is "taking her mother to [facility] at this time.  She is having diarrhea still, b/p is close to 200, blood sugar = 223, she is weak, blurred vision and feeling 'faintified'.  She was seen 2 weeks ago and was told to call if any changes."  Will notify providers.

## 2014-04-23 NOTE — Discharge Instructions (Signed)
Followup with your doctor for your high blood pressure and please be compliant with a diabetic diet Arterial Hypertension Arterial hypertension (high blood pressure) is a condition of elevated pressure in your blood vessels. Hypertension over a long period of time is a risk factor for strokes, heart attacks, and heart failure. It is also the leading cause of kidney (renal) failure.  CAUSES   In Adults -- Over 90% of all hypertension has no known cause. This is called essential or primary hypertension. In the other 10% of people with hypertension, the increase in blood pressure is caused by another disorder. This is called secondary hypertension. Important causes of secondary hypertension are:  Heavy alcohol use.  Obstructive sleep apnea.  Hyperaldosterosim (Conn's syndrome).  Steroid use.  Chronic kidney failure.  Hyperparathyroidism.  Medications.  Renal artery stenosis.  Pheochromocytoma.  Cushing's disease.  Coarctation of the aorta.  Scleroderma renal crisis.  Licorice (in excessive amounts).  Drugs (cocaine, methamphetamine). Your caregiver can explain any items above that apply to you.  In Children -- Secondary hypertension is more common and should always be considered.  Pregnancy -- Few women of childbearing age have high blood pressure. However, up to 10% of them develop hypertension of pregnancy. Generally, this will not harm the woman. It may be a sign of 3 complications of pregnancy: preeclampsia, HELLP syndrome, and eclampsia. Follow up and control with medication is necessary. SYMPTOMS   This condition normally does not produce any noticeable symptoms. It is usually found during a routine exam.  Malignant hypertension is a late problem of high blood pressure. It may have the following symptoms:  Headaches.  Blurred vision.  End-organ damage (this means your kidneys, heart, lungs, and other organs are being damaged).  Stressful situations can increase  the blood pressure. If a person with normal blood pressure has their blood pressure go up while being seen by their caregiver, this is often termed "white coat hypertension." Its importance is not known. It may be related with eventually developing hypertension or complications of hypertension.  Hypertension is often confused with mental tension, stress, and anxiety. DIAGNOSIS  The diagnosis is made by 3 separate blood pressure measurements. They are taken at least 1 week apart from each other. If there is organ damage from hypertension, the diagnosis may be made without repeat measurements. Hypertension is usually identified by having blood pressure readings:  Above 140/90 mmHg measured in both arms, at 3 separate times, over a couple weeks.  Over 130/80 mmHg should be considered a risk factor and may require treatment in patients with diabetes. Blood pressure readings over 120/80 mmHg are called "pre-hypertension" even in non-diabetic patients. To get a true blood pressure measurement, use the following guidelines. Be aware of the factors that can alter blood pressure readings.  Take measurements at least 1 hour after caffeine.  Take measurements 30 minutes after smoking and without any stress. This is another reason to quit smoking  it raises your blood pressure.  Use a proper cuff size. Ask your caregiver if you are not sure about your cuff size.  Most home blood pressure cuffs are automatic. They will measure systolic and diastolic pressures. The systolic pressure is the pressure reading at the start of sounds. Diastolic pressure is the pressure at which the sounds disappear. If you are elderly, measure pressures in multiple postures. Try sitting, lying or standing.  Sit at rest for a minimum of 5 minutes before taking measurements.  You should not be on any medications  like decongestants. These are found in many cold medications.  Record your blood pressure readings and review them  with your caregiver. If you have hypertension:  Your caregiver may do tests to be sure you do not have secondary hypertension (see "causes" above).  Your caregiver may also look for signs of metabolic syndrome. This is also called Syndrome X or Insulin Resistance Syndrome. You may have this syndrome if you have type 2 diabetes, abdominal obesity, and abnormal blood lipids in addition to hypertension.  Your caregiver will take your medical and family history and perform a physical exam.  Diagnostic tests may include blood tests (for glucose, cholesterol, potassium, and kidney function), a urinalysis, or an EKG. Other tests may also be necessary depending on your condition. PREVENTION  There are important lifestyle issues that you can adopt to reduce your chance of developing hypertension:  Maintain a normal weight.  Limit the amount of salt (sodium) in your diet.  Exercise often.  Limit alcohol intake.  Get enough potassium in your diet. Discuss specific advice with your caregiver.  Follow a DASH diet (dietary approaches to stop hypertension). This diet is rich in fruits, vegetables, and low-fat dairy products, and avoids certain fats. PROGNOSIS  Essential hypertension cannot be cured. Lifestyle changes and medical treatment can lower blood pressure and reduce complications. The prognosis of secondary hypertension depends on the underlying cause. Many people whose hypertension is controlled with medicine or lifestyle changes can live a normal, healthy life.  RISKS AND COMPLICATIONS  While high blood pressure alone is not an illness, it often requires treatment due to its short- and long-term effects on many organs. Hypertension increases your risk for:  CVAs or strokes (cerebrovascular accident).  Heart failure due to chronically high blood pressure (hypertensive cardiomyopathy).  Heart attack (myocardial infarction).  Damage to the retina (hypertensive retinopathy).  Kidney  failure (hypertensive nephropathy). Your caregiver can explain list items above that apply to you. Treatment of hypertension can significantly reduce the risk of complications. TREATMENT   For overweight patients, weight loss and regular exercise are recommended. Physical fitness lowers blood pressure.  Mild hypertension is usually treated with diet and exercise. A diet rich in fruits and vegetables, fat-free dairy products, and foods low in fat and salt (sodium) can help lower blood pressure. Decreasing salt intake decreases blood pressure in a 1/3 of people.  Stop smoking if you are a smoker. The steps above are highly effective in reducing blood pressure. While these actions are easy to suggest, they are difficult to achieve. Most patients with moderate or severe hypertension end up requiring medications to bring their blood pressure down to a normal level. There are several classes of medications for treatment. Blood pressure pills (antihypertensives) will lower blood pressure by their different actions. Lowering the blood pressure by 10 mmHg may decrease the risk of complications by as much as 25%. The goal of treatment is effective blood pressure control. This will reduce your risk for complications. Your caregiver will help you determine the best treatment for you according to your lifestyle. What is excellent treatment for one person, may not be for you. HOME CARE INSTRUCTIONS   Do not smoke.  Follow the lifestyle changes outlined in the "Prevention" section.  If you are on medications, follow the directions carefully. Blood pressure medications must be taken as prescribed. Skipping doses reduces their benefit. It also puts you at risk for problems.  Follow up with your caregiver, as directed.  If you are asked to  monitor your blood pressure at home, follow the guidelines in the "Diagnosis" section above. SEEK MEDICAL CARE IF:   You think you are having medication side effects.  You  have recurrent headaches or lightheadedness.  You have swelling in your ankles.  You have trouble with your vision. SEEK IMMEDIATE MEDICAL CARE IF:   You have sudden onset of chest pain or pressure, difficulty breathing, or other symptoms of a heart attack.  You have a severe headache.  You have symptoms of a stroke (such as sudden weakness, difficulty speaking, difficulty walking). MAKE SURE YOU:   Understand these instructions.  Will watch your condition.  Will get help right away if you are not doing well or get worse. Document Released: 10/29/2005 Document Revised: 01/21/2012 Document Reviewed: 05/29/2007 [facility] Patient Information 2014 [place]. Hyperglycemia Hyperglycemia occurs when the glucose (sugar) in your blood is too high. Hyperglycemia can happen for many reasons, but it most often happens to people who do not know they have diabetes or are not managing their diabetes properly.  CAUSES  Whether you have diabetes or not, there are other causes of hyperglycemia. Hyperglycemia can occur when you have diabetes, but it can also occur in other situations that you might not be as aware of, such as: Diabetes  If you have diabetes and are having problems controlling your blood glucose, hyperglycemia could occur because of some of the following reasons:  Not following your meal plan.  Not taking your diabetes medications or not taking it properly.  Exercising less or doing less activity than you normally do.  Being sick. Pre-diabetes  This cannot be ignored. Before people develop Type 2 diabetes, they almost always have "pre-diabetes." This is when your blood glucose levels are higher than normal, but not yet high enough to be diagnosed as diabetes. Research has shown that some long-term damage to the body, especially the heart and circulatory system, may already be occurring during pre-diabetes. If you take action to manage your blood glucose when you have  pre-diabetes, you may delay or prevent Type 2 diabetes from developing. Stress  If you have diabetes, you may be "diet" controlled or on oral medications or insulin to control your diabetes. However, you may find that your blood glucose is higher than usual in the hospital whether you have diabetes or not. This is often referred to as "stress hyperglycemia." Stress can elevate your blood glucose. This happens because of hormones put out by the body during times of stress. If stress has been the cause of your high blood glucose, it can be followed regularly by your caregiver. That way he/she can make sure your hyperglycemia does not continue to get worse or progress to diabetes. Steroids  Steroids are medications that act on the infection fighting system (immune system) to block inflammation or infection. One side effect can be a rise in blood glucose. Most people can produce enough extra insulin to allow for this rise, but for those who cannot, steroids make blood glucose levels go even higher. It is not unusual for steroid treatments to "uncover" diabetes that is developing. It is not always possible to determine if the hyperglycemia will go away after the steroids are stopped. A special blood test called an A1c is sometimes done to determine if your blood glucose was elevated before the steroids were started. SYMPTOMS  Thirsty.  Frequent urination.  Dry mouth.  Blurred vision.  Tired or fatigue.  Weakness.  Sleepy.  Tingling in feet or leg. DIAGNOSIS  Diagnosis is made by monitoring blood glucose in one or all of the following ways:  A1c test. This is a chemical found in your blood.  Fingerstick blood glucose monitoring.  Laboratory results. TREATMENT  First, knowing the cause of the hyperglycemia is important before the hyperglycemia can be treated. Treatment may include, but is not be limited to:  Education.  Change or adjustment in medications.  Change or adjustment in  meal plan.  Treatment for an illness, infection, etc.  More frequent blood glucose monitoring.  Change in exercise plan.  Decreasing or stopping steroids.  Lifestyle changes. HOME CARE INSTRUCTIONS   Test your blood glucose as directed.  Exercise regularly. Your caregiver will give you instructions about exercise. Pre-diabetes or diabetes which comes on with stress is helped by exercising.  Eat wholesome, balanced meals. Eat often and at regular, fixed times. Your caregiver or nutritionist will give you a meal plan to guide your sugar intake.  Being at an ideal weight is important. If needed, losing as little as 10 to 15 pounds may help improve blood glucose levels. SEEK MEDICAL CARE IF:   You have questions about medicine, activity, or diet.  You continue to have symptoms (problems such as increased thirst, urination, or weight gain). SEEK IMMEDIATE MEDICAL CARE IF:   You are vomiting or have diarrhea.  Your breath smells fruity.  You are breathing faster or slower.  You are very sleepy or incoherent.  You have numbness, tingling, or pain in your feet or hands.  You have chest pain.  Your symptoms get worse even though you have been following your caregiver's orders.  If you have any other questions or concerns. Document Released: 04/24/2001 Document Revised: 01/21/2012 Document Reviewed: 02/25/2012 [facility] Patient Information 2014 [place], [place].

## 2014-04-23 NOTE — ED Notes (Signed)
Pt sts she has had diarrhea for several days as well as an increase in her BP. Pt is also has DM. Pt is also having abd pain with her diarrhea.

## 2014-04-25 LAB — URINE CULTURE

## 2014-04-27 ENCOUNTER — Emergency Department (HOSPITAL_COMMUNITY)
Admission: EM | Admit: 2014-04-27 | Discharge: 2014-04-27 | Disposition: A | Discharge status: Home / Self Care | Payer: PRIVATE HEALTH INSURANCE | Attending: Emergency Medicine | Admitting: Emergency Medicine

## 2014-04-27 ENCOUNTER — Encounter (HOSPITAL_COMMUNITY): Payer: Self-pay | Admitting: Emergency Medicine

## 2014-04-27 ENCOUNTER — Telehealth (HOSPITAL_BASED_OUTPATIENT_CLINIC_OR_DEPARTMENT_OTHER): Payer: Self-pay | Admitting: Emergency Medicine

## 2014-04-27 DIAGNOSIS — R531 Weakness: Secondary | ICD-10-CM

## 2014-04-27 DIAGNOSIS — R110 Nausea: Secondary | ICD-10-CM | POA: Insufficient documentation

## 2014-04-27 DIAGNOSIS — Z853 Personal history of malignant neoplasm of breast: Secondary | ICD-10-CM | POA: Insufficient documentation

## 2014-04-27 DIAGNOSIS — R5381 Other malaise: Secondary | ICD-10-CM | POA: Insufficient documentation

## 2014-04-27 DIAGNOSIS — Z79899 Other long term (current) drug therapy: Secondary | ICD-10-CM | POA: Insufficient documentation

## 2014-04-27 DIAGNOSIS — I10 Essential (primary) hypertension: Secondary | ICD-10-CM | POA: Insufficient documentation

## 2014-04-27 DIAGNOSIS — N390 Urinary tract infection, site not specified: Secondary | ICD-10-CM | POA: Insufficient documentation

## 2014-04-27 DIAGNOSIS — R42 Dizziness and giddiness: Secondary | ICD-10-CM | POA: Insufficient documentation

## 2014-04-27 DIAGNOSIS — Z7982 Long term (current) use of aspirin: Secondary | ICD-10-CM | POA: Insufficient documentation

## 2014-04-27 DIAGNOSIS — E119 Type 2 diabetes mellitus without complications: Secondary | ICD-10-CM | POA: Insufficient documentation

## 2014-04-27 DIAGNOSIS — Z792 Long term (current) use of antibiotics: Secondary | ICD-10-CM | POA: Insufficient documentation

## 2014-04-27 DIAGNOSIS — R5383 Other fatigue: Secondary | ICD-10-CM

## 2014-04-27 DIAGNOSIS — M129 Arthropathy, unspecified: Secondary | ICD-10-CM | POA: Insufficient documentation

## 2014-04-27 DIAGNOSIS — E039 Hypothyroidism, unspecified: Secondary | ICD-10-CM | POA: Insufficient documentation

## 2014-04-27 DIAGNOSIS — R197 Diarrhea, unspecified: Secondary | ICD-10-CM | POA: Insufficient documentation

## 2014-04-27 LAB — CBC WITH DIFFERENTIAL/PLATELET
BASOS PCT: 0 % (ref 0–1)
Basophils Absolute: 0 10*3/uL (ref 0.0–0.1)
Eosinophils Absolute: 0.3 10*3/uL (ref 0.0–0.7)
Eosinophils Relative: 1 % (ref 0–5)
HCT: 32.4 % — ABNORMAL LOW (ref 36.0–46.0)
Hemoglobin: 10.2 g/dL — ABNORMAL LOW (ref 12.0–15.0)
LYMPHS ABS: 10.3 10*3/uL — AB (ref 0.7–4.0)
Lymphocytes Relative: 40 % (ref 12–46)
MCH: 25.5 pg — ABNORMAL LOW (ref 26.0–34.0)
MCHC: 31.5 g/dL (ref 30.0–36.0)
MCV: 81 fL (ref 78.0–100.0)
MONO ABS: 1.3 10*3/uL — AB (ref 0.1–1.0)
Monocytes Relative: 5 % (ref 3–12)
Neutro Abs: 13.9 10*3/uL — ABNORMAL HIGH (ref 1.7–7.7)
Neutrophils Relative %: 54 % (ref 43–77)
PLATELETS: 430 10*3/uL — AB (ref 150–400)
RBC: 4 MIL/uL (ref 3.87–5.11)
RDW: 13.9 % (ref 11.5–15.5)
WBC: 25.8 10*3/uL — ABNORMAL HIGH (ref 4.0–10.5)

## 2014-04-27 LAB — COMPREHENSIVE METABOLIC PANEL
ALT: 25 U/L (ref 0–35)
AST: 30 U/L (ref 0–37)
Albumin: 3.4 g/dL — ABNORMAL LOW (ref 3.5–5.2)
Alkaline Phosphatase: 93 U/L (ref 39–117)
BILIRUBIN TOTAL: 0.3 mg/dL (ref 0.3–1.2)
BUN: 10 mg/dL (ref 6–23)
CHLORIDE: 98 meq/L (ref 96–112)
CO2: 21 meq/L (ref 19–32)
CREATININE: 0.64 mg/dL (ref 0.50–1.10)
Calcium: 9.4 mg/dL (ref 8.4–10.5)
GFR calc Af Amer: 90 mL/min — ABNORMAL LOW (ref >90)
GFR, EST NON AFRICAN AMERICAN: 77 mL/min — AB (ref >90)
GLUCOSE: 215 mg/dL — AB (ref 70–99)
Potassium: 3.7 mEq/L (ref 3.7–5.3)
Sodium: 136 mEq/L — ABNORMAL LOW (ref 137–147)
Total Protein: 6.9 g/dL (ref 6.0–8.3)

## 2014-04-27 LAB — URINALYSIS, ROUTINE W REFLEX MICROSCOPIC
Bilirubin Urine: NEGATIVE
Glucose, UA: NEGATIVE mg/dL
Hgb urine dipstick: NEGATIVE
Ketones, ur: NEGATIVE mg/dL
Nitrite: POSITIVE — AB
PROTEIN: 100 mg/dL — AB
Specific Gravity, Urine: 1.023 (ref 1.005–1.030)
UROBILINOGEN UA: 1 mg/dL (ref 0.0–1.0)
pH: 6 (ref 5.0–8.0)

## 2014-04-27 LAB — URINE MICROSCOPIC-ADD ON

## 2014-04-27 LAB — CBG MONITORING, ED: Glucose-Capillary: 183 mg/dL — ABNORMAL HIGH (ref 70–99)

## 2014-04-27 MED ORDER — CIPROFLOXACIN HCL 500 MG PO TABS: 500.0000 mg | ORAL_TABLET | Freq: Two times a day (BID) | ORAL | Status: DC

## 2014-04-27 MED ORDER — METRONIDAZOLE 500 MG PO TABS: 500.0000 mg | ORAL_TABLET | Freq: Two times a day (BID) | ORAL | Status: DC

## 2014-04-27 MED ORDER — SODIUM CHLORIDE 0.9 % IV BOLUS (SEPSIS)
1000.0000 mL | INTRAVENOUS | Status: AC
  Administered 2014-04-27: 1000 mL via INTRAVENOUS

## 2014-04-27 NOTE — ED Notes (Signed)
MD at bedside. 

## 2014-04-27 NOTE — ED Notes (Signed)
She states shes had diarrhea for 4 weeks. She has abd pain. shes had weakness and dizziness today.

## 2014-04-27 NOTE — Telephone Encounter (Signed)
Post ED Visit - Positive Culture Follow-up  Culture report reviewed by antimicrobial stewardship pharmacist: []  Wes Wimer, Pharm.D., BCPS [x]  Heide Guile, Pharm.D., BCPS []  Alycia Rossetti, Pharm.D., BCPS []  Franquez, Pharm.D., BCPS, AAHIVP []  Legrand Como, Pharm.D., BCPS, AAHIVP []  Juliene Pina, Pharm.D.  Positive urine culture Per Glendell Docker NP, no treatment needed and no further patient follow-up is required at this time.  [place]ancho, Rex Kras 04/27/2014, 2:10 PM

## 2014-04-27 NOTE — ED Provider Notes (Signed)
CSN: [id number]     Arrival date & time 04/27/14  1200 History   First MD Initiated Contact with Patient 04/27/14 1304     Chief Complaint  Patient presents with  . Diarrhea     (Consider location/radiation/quality/duration/timing/severity/associated sxs/prior Treatment) HPI Pt is an 80yo female with hx of HTN, diabetes mellitus, arthritis, MVP, breast cancer and hypothyroidism presenting to ED c/o 4 weeks of watery diarrhea associated with diffuse abdominal pain, weakness and dizziness that started today.  Pt was evaluated on 6/12 for same and discharged home to f/u with PCP.  Pt states she has a f/u appointment with PCP at the end of the week but states she will not be able to make it because she came here for evaluation.  States she feels "too weak."  Denies vomiting or fever. Denies previous hx of persistent diarrhea. States she did take 2 doses of amoxicillin for a dental infection that made her diarrhea worse 3 weeks ago but states she had diarrhea prior to taking amoxicillin. Abdominal surgical hx signficant for total abdominal hysterectomy with bilatearl salpingoophorectomy, pelvic and para-aortic lymph node dissection and appendectomy.   Past Medical History  Diagnosis Date  . Diabetes mellitus   . Hypertension   . Arthritis   . MVP (mitral valve prolapse)   . Hx of breast cancer     RIGHT BREAST  . Hypothyroidism    Past Surgical History  Procedure Laterality Date  . Hemiarthroplasty hip    . Knee arthroscopy      RIGHT KNEE  . Total abdominal hysterectomy w/ bilateral salpingoophorectomy    . Pelvic and para-aortic lymph node dissection    . Tonsillectomy    . Appendectomy    . Cholecystectomy    . US echocardiography  03/30/2009    EF 55-60%  . Cardiovascular stress test  01/28/2008    EF 74%   Family History  Problem Relation Age of Onset  . Hypertension Mother   . Heart attack Father   . Hypertension Father   . Diabetes Sister   . Hypertension Sister   . Liver  cancer Sister   . Heart attack Brother   . Hypertension Brother   . Diabetes Brother   . Diabetes Sister   . Hypertension Sister    History  Substance Use Topics  . Smoking status: Never Smoker   . Smokeless tobacco: Not on file  . Alcohol Use: No   OB History   Grav Para Term Preterm Abortions TAB SAB Ect Mult Living                 Review of Systems  Constitutional: Positive for fatigue. Negative for fever, chills and diaphoresis.  Respiratory: Negative for cough and shortness of breath.   Cardiovascular: Negative for chest pain and palpitations.  Gastrointestinal: Positive for nausea, abdominal pain and diarrhea. Negative for vomiting, constipation and blood in stool.  Genitourinary: Negative for dysuria, urgency and hematuria.  Musculoskeletal: Negative for back pain and myalgias.  Neurological: Positive for dizziness, weakness ( generalized) and light-headedness. Negative for tremors, seizures, syncope, numbness and headaches.  All other systems reviewed and are negative.     Allergies  Codeine; Demerol; Librium; Lipitor; and Metoprolol  Home Medications   Prior to Admission medications   Medication Sig Start Date End Date Taking? Authorizing Provider  amLODipine-valsartan (EXFORGE) 10-320 MG per tablet Take 1 tablet by mouth daily. 02/17/14  Yes Sueanne Margarita, MD  anastrozole (ARIMIDEX) 1 MG tablet Take  1 mg by mouth daily.   Yes Historical Provider, MD  aspirin EC 81 MG tablet Take 81 mg by mouth daily.   Yes Historical Provider, MD  atenolol (TENORMIN) 25 MG tablet Take 1 tablet (25 mg total) by mouth 2 (two) times daily. 03/10/14  Yes Darlin Coco, MD  Calcium Carbonate-Vitamin D (CALCIUM + D PO) Take by mouth daily after lunch.    Yes Historical Provider, MD  cholecalciferol (VITAMIN D) 1000 UNITS tablet Take 1,000 Units by mouth daily.   Yes Historical Provider, MD  Cyanocobalamin (VITAMIN B-12 PO) Take by mouth daily.     Yes Historical Provider, MD   fluticasone (FLONASE) 50 MCG/ACT nasal spray Place 2 sprays into the nose daily as needed for allergies or rhinitis.  11/26/11  Yes Historical Provider, MD  glimepiride (AMARYL) 4 MG tablet Take 4 mg by mouth daily with lunch.   Yes Historical Provider, MD  Glucose Blood (FREESTYLE LITE TEST VI) 3 (three) times daily as needed (blood sugar monitoring.).  08/27/13  Yes Historical Provider, MD  levothyroxine (SYNTHROID, LEVOTHROID) 75 MCG tablet Take 75 mcg by mouth daily.     Yes Historical Provider, MD  metFORMIN (GLUCOPHAGE) 500 MG tablet Take 500 mg by mouth 2 (two) times daily. With lunch and dinner.   Yes Historical Provider, MD  pantoprazole (PROTONIX) 40 MG tablet Take 40 mg by mouth daily as needed (acid).    Yes Historical Provider, MD  potassium chloride (K-DUR) 10 MEQ tablet Take 10 mEq by mouth daily.   Yes Historical Provider, MD  ZETIA 10 MG tablet Take 10 mg by mouth Daily.  01/31/12  Yes Historical Provider, MD  ciprofloxacin (CIPRO) 500 MG tablet Take 1 tablet (500 mg total) by mouth 2 (two) times daily. 04/27/14   Noland Fordyce, PA-C  metroNIDAZOLE (FLAGYL) 500 MG tablet Take 1 tablet (500 mg total) by mouth 2 (two) times daily. 04/27/14   Noland Fordyce, PA-C   BP 156/60  Pulse 69  Temp(Src) 98.1 F (36.7 C) (Oral)  Resp 16  Ht 5\' 4"  (1.626 m)  Wt 206 lb (93.441 kg)  BMI 35.34 kg/m2  SpO2 94% Physical Exam  Nursing note and vitals reviewed. Constitutional: She appears well-developed and well-nourished. No distress.  Pt lying in exam bed, NAD  HENT:  Head: Normocephalic and atraumatic.  Eyes: Conjunctivae are normal. No scleral icterus.  Neck: Normal range of motion.  Cardiovascular: Normal rate, regular rhythm and normal heart sounds.   Pulmonary/Chest: Effort normal and breath sounds normal. No respiratory distress. She has no wheezes. She has no rales. She exhibits no tenderness.  Abdominal: Soft. Bowel sounds are normal. She exhibits no distension and no mass. There is  tenderness. There is no rebound and no guarding.  Soft, non-distended. Mild-diffuse  Musculoskeletal: Normal range of motion.  Neurological: She is alert.  Skin: Skin is warm and dry. She is not diaphoretic.    ED Course  Procedures (including critical care time) Labs Review Labs Reviewed  CBC WITH DIFFERENTIAL - Abnormal; Notable for the following:    WBC 25.8 (*)    Hemoglobin 10.2 (*)    HCT 32.4 (*)    MCH 25.5 (*)    Platelets 430 (*)    Neutro Abs 13.9 (*)    Lymphs Abs 10.3 (*)    Monocytes Absolute 1.3 (*)    All other components within normal limits  COMPREHENSIVE METABOLIC PANEL - Abnormal; Notable for the following:    Sodium 136 (*)  Glucose, Bld 215 (*)    Albumin 3.4 (*)    GFR calc non Af Amer 77 (*)    GFR calc Af Amer 90 (*)    All other components within normal limits  URINALYSIS, ROUTINE W REFLEX MICROSCOPIC - Abnormal; Notable for the following:    APPearance CLOUDY (*)    Protein, ur 100 (*)    Nitrite POSITIVE (*)    Leukocytes, UA SMALL (*)    All other components within normal limits  URINE MICROSCOPIC-ADD ON - Abnormal; Notable for the following:    Bacteria, UA MANY (*)    All other components within normal limits  CBG MONITORING, ED - Abnormal; Notable for the following:    Glucose-Capillary 183 (*)    All other components within normal limits  URINE CULTURE    Imaging Review No results found.   EKG Interpretation   Date/Time:  Tuesday April 27 2014 12:06:33 EDT Ventricular Rate:  76 PR Interval:  158 QRS Duration: 148 QT Interval:  450 QTC Calculation: 506 R Axis:   -11 Text Interpretation:  Normal sinus rhythm Right bundle branch block  Abnormal ECG Confirmed by Christy Gentles  MD, Elenore Rota ([id number]) on 04/27/2014  3:17:48 PM      MDM   Final diagnoses:  UTI (lower urinary tract infection)  Diarrhea  Weakness    Pt is an 80yo female presenting to ED with reports of chronic diarrhea.  Pt appears well, non-toxic. Abd-soft,  non-distended, mild diffuse tenderness. Vitals: unremarkable.  Labs: CBC and CMP-consistent with previous. UA-evidence of UTI.  Discussed pt with Dr. Christy Gentles, no evidence of emergent process taking place at this time. Abdominal exam-non-surgical. Pt unable to provide stool sample.  Will tx UTI and possible enteritis with cipro and flagyl. Advised to f/u with PCP as scheduled for Friday, 6/19.  Return precautions provided. Pt verbalized understanding and agreement with tx plan.     Noland Fordyce, PA-C 04/27/14 1546

## 2014-04-28 NOTE — ED Provider Notes (Signed)
Medical screening examination/treatment/procedure(s) were conducted as a shared visit with non-physician practitioner(s) and myself.  I personally evaluated the patient during the encounter.   EKG Interpretation   Date/Time:  Tuesday April 27 2014 12:06:33 EDT Ventricular Rate:  76 PR Interval:  158 QRS Duration: 148 QT Interval:  450 QTC Calculation: 506 R Axis:   -11 Text Interpretation:  Normal sinus rhythm Right bundle branch block  Abnormal ECG Confirmed by Christy Gentles  MD, Elenore Rota ([id number]) on 04/27/2014  3:17:48 PM       Pt well appearing on my evaluation.  She told me she could not produce stool sample and kept asking how long she would be in the ED and appeared anxious for discharge home.  I advised PCP Followup  Sharyon Cable, MD 04/28/14 2007

## 2014-04-29 LAB — URINE CULTURE: Colony Count: >=100000

## 2014-05-01 NOTE — Telephone Encounter (Signed)
Post ED Visit - Positive Culture Follow-up  Culture report reviewed by antimicrobial stewardship pharmacist: []  Wes Woodford, Pharm.D., BCPS []  Heide Guile, Pharm.D., BCPS []  Alycia Rossetti, Pharm.D., BCPS []  [place], Pharm.D., BCPS, AAHIVP []  Legrand Como, Pharm.D., BCPS, AAHIVP []  Juliene Pina, Pharm.D. [x]  Eligah East, Pharm.D.  Positive urine culture Treated with Cipro, organism sensitive to the same and no further patient follow-up is required at this time.  Myrna Blazer 05/01/2014, 9:51 AM

## 2014-05-18 ENCOUNTER — Telehealth: Payer: Self-pay | Admitting: Oncology

## 2014-05-18 NOTE — Telephone Encounter (Signed)
Pt called and wants to r/s lab and MD , emailed Dr Alvy Bimler for an Md spot

## 2014-05-19 ENCOUNTER — Telehealth: Payer: Self-pay | Admitting: Oncology

## 2014-05-19 NOTE — Telephone Encounter (Signed)
Pt called r/s appt from 8/4, pt was moved to 8/4 due to MD call, pt moved appt to [facility] 8/25

## 2014-06-08 ENCOUNTER — Ambulatory Visit: Payer: PRIVATE HEALTH INSURANCE | Admitting: Oncology

## 2014-06-08 ENCOUNTER — Other Ambulatory Visit: Payer: PRIVATE HEALTH INSURANCE

## 2014-06-15 ENCOUNTER — Ambulatory Visit: Payer: PRIVATE HEALTH INSURANCE | Admitting: Oncology

## 2014-06-15 ENCOUNTER — Other Ambulatory Visit: Payer: PRIVATE HEALTH INSURANCE

## 2014-07-06 ENCOUNTER — Other Ambulatory Visit: Payer: Self-pay

## 2014-07-06 ENCOUNTER — Emergency Department (HOSPITAL_COMMUNITY): Payer: Medicare Other

## 2014-07-06 ENCOUNTER — Inpatient Hospital Stay (HOSPITAL_COMMUNITY)
Admission: EM | Admit: 2014-07-06 | Discharge: 2014-07-11 | DRG: 393 | Disposition: A | Discharge status: Home / Self Care | Payer: Medicare Other | Attending: Internal Medicine | Admitting: Internal Medicine

## 2014-07-06 ENCOUNTER — Ambulatory Visit (HOSPITAL_BASED_OUTPATIENT_CLINIC_OR_DEPARTMENT_OTHER): Payer: PRIVATE HEALTH INSURANCE | Admitting: Nurse Practitioner

## 2014-07-06 ENCOUNTER — Encounter (HOSPITAL_COMMUNITY): Payer: Self-pay | Admitting: Emergency Medicine

## 2014-07-06 ENCOUNTER — Other Ambulatory Visit (HOSPITAL_BASED_OUTPATIENT_CLINIC_OR_DEPARTMENT_OTHER): Payer: PRIVATE HEALTH INSURANCE

## 2014-07-06 ENCOUNTER — Telehealth: Payer: Self-pay | Admitting: Oncology

## 2014-07-06 VITALS — BP 157/51 | HR 69 | Temp 97.9°F | Resp 20 | Ht 64.0 in | Wt 200.1 lb

## 2014-07-06 DIAGNOSIS — D62 Acute posthemorrhagic anemia: Secondary | ICD-10-CM | POA: Diagnosis present

## 2014-07-06 DIAGNOSIS — I059 Rheumatic mitral valve disease, unspecified: Secondary | ICD-10-CM | POA: Diagnosis present

## 2014-07-06 DIAGNOSIS — E1149 Type 2 diabetes mellitus with other diabetic neurological complication: Secondary | ICD-10-CM | POA: Diagnosis present

## 2014-07-06 DIAGNOSIS — K648 Other hemorrhoids: Secondary | ICD-10-CM | POA: Diagnosis present

## 2014-07-06 DIAGNOSIS — E039 Hypothyroidism, unspecified: Secondary | ICD-10-CM | POA: Diagnosis present

## 2014-07-06 DIAGNOSIS — C9110 Chronic lymphocytic leukemia of B-cell type not having achieved remission: Secondary | ICD-10-CM | POA: Diagnosis present

## 2014-07-06 DIAGNOSIS — Z800 Family history of malignant neoplasm of digestive organs: Secondary | ICD-10-CM

## 2014-07-06 DIAGNOSIS — M129 Arthropathy, unspecified: Secondary | ICD-10-CM | POA: Diagnosis present

## 2014-07-06 DIAGNOSIS — K5730 Diverticulosis of large intestine without perforation or abscess without bleeding: Secondary | ICD-10-CM | POA: Diagnosis present

## 2014-07-06 DIAGNOSIS — C50919 Malignant neoplasm of unspecified site of unspecified female breast: Secondary | ICD-10-CM | POA: Diagnosis present

## 2014-07-06 DIAGNOSIS — Z87440 Personal history of urinary (tract) infections: Secondary | ICD-10-CM | POA: Diagnosis not present

## 2014-07-06 DIAGNOSIS — C7952 Secondary malignant neoplasm of bone marrow: Secondary | ICD-10-CM

## 2014-07-06 DIAGNOSIS — I2489 Other forms of acute ischemic heart disease: Secondary | ICD-10-CM | POA: Diagnosis present

## 2014-07-06 DIAGNOSIS — D630 Anemia in neoplastic disease: Secondary | ICD-10-CM | POA: Diagnosis present

## 2014-07-06 DIAGNOSIS — N3289 Other specified disorders of bladder: Secondary | ICD-10-CM | POA: Diagnosis present

## 2014-07-06 DIAGNOSIS — Z8249 Family history of ischemic heart disease and other diseases of the circulatory system: Secondary | ICD-10-CM

## 2014-07-06 DIAGNOSIS — I248 Other forms of acute ischemic heart disease: Secondary | ICD-10-CM | POA: Diagnosis present

## 2014-07-06 DIAGNOSIS — I214 Non-ST elevation (NSTEMI) myocardial infarction: Secondary | ICD-10-CM | POA: Diagnosis present

## 2014-07-06 DIAGNOSIS — R55 Syncope and collapse: Secondary | ICD-10-CM | POA: Diagnosis present

## 2014-07-06 DIAGNOSIS — Z7982 Long term (current) use of aspirin: Secondary | ICD-10-CM | POA: Diagnosis not present

## 2014-07-06 DIAGNOSIS — I119 Hypertensive heart disease without heart failure: Secondary | ICD-10-CM

## 2014-07-06 DIAGNOSIS — I4510 Unspecified right bundle-branch block: Secondary | ICD-10-CM | POA: Diagnosis present

## 2014-07-06 DIAGNOSIS — IMO0001 Reserved for inherently not codable concepts without codable children: Secondary | ICD-10-CM

## 2014-07-06 DIAGNOSIS — E871 Hypo-osmolality and hyponatremia: Secondary | ICD-10-CM | POA: Diagnosis present

## 2014-07-06 DIAGNOSIS — K635 Polyp of colon: Secondary | ICD-10-CM

## 2014-07-06 DIAGNOSIS — D72829 Elevated white blood cell count, unspecified: Secondary | ICD-10-CM | POA: Diagnosis present

## 2014-07-06 DIAGNOSIS — R011 Cardiac murmur, unspecified: Secondary | ICD-10-CM

## 2014-07-06 DIAGNOSIS — C7951 Secondary malignant neoplasm of bone: Secondary | ICD-10-CM | POA: Diagnosis present

## 2014-07-06 DIAGNOSIS — Z6833 Body mass index (BMI) 33.0-33.9, adult: Secondary | ICD-10-CM | POA: Diagnosis not present

## 2014-07-06 DIAGNOSIS — K922 Gastrointestinal hemorrhage, unspecified: Secondary | ICD-10-CM

## 2014-07-06 DIAGNOSIS — Z833 Family history of diabetes mellitus: Secondary | ICD-10-CM | POA: Diagnosis not present

## 2014-07-06 DIAGNOSIS — Z79899 Other long term (current) drug therapy: Secondary | ICD-10-CM

## 2014-07-06 DIAGNOSIS — D649 Anemia, unspecified: Secondary | ICD-10-CM

## 2014-07-06 DIAGNOSIS — E785 Hyperlipidemia, unspecified: Secondary | ICD-10-CM | POA: Diagnosis present

## 2014-07-06 DIAGNOSIS — E1165 Type 2 diabetes mellitus with hyperglycemia: Principal | ICD-10-CM

## 2014-07-06 DIAGNOSIS — Z888 Allergy status to other drugs, medicaments and biological substances status: Secondary | ICD-10-CM | POA: Diagnosis not present

## 2014-07-06 DIAGNOSIS — R112 Nausea with vomiting, unspecified: Secondary | ICD-10-CM

## 2014-07-06 DIAGNOSIS — D500 Iron deficiency anemia secondary to blood loss (chronic): Secondary | ICD-10-CM

## 2014-07-06 DIAGNOSIS — Z87311 Personal history of (healed) other pathological fracture: Secondary | ICD-10-CM

## 2014-07-06 DIAGNOSIS — E1142 Type 2 diabetes mellitus with diabetic polyneuropathy: Secondary | ICD-10-CM | POA: Diagnosis present

## 2014-07-06 DIAGNOSIS — Z885 Allergy status to narcotic agent status: Secondary | ICD-10-CM

## 2014-07-06 DIAGNOSIS — I493 Ventricular premature depolarization: Secondary | ICD-10-CM

## 2014-07-06 DIAGNOSIS — E7800 Pure hypercholesterolemia, unspecified: Secondary | ICD-10-CM

## 2014-07-06 DIAGNOSIS — D126 Benign neoplasm of colon, unspecified: Principal | ICD-10-CM | POA: Diagnosis present

## 2014-07-06 DIAGNOSIS — I10 Essential (primary) hypertension: Secondary | ICD-10-CM | POA: Diagnosis present

## 2014-07-06 HISTORY — DX: Anemia, unspecified: D64.9

## 2014-07-06 LAB — CBC WITH DIFFERENTIAL/PLATELET
BASO%: 1.3 % (ref 0.0–2.0)
BASOS ABS: 0 10*3/uL (ref 0.0–0.1)
Basophils Absolute: 0.3 10*3/uL — ABNORMAL HIGH (ref 0.0–0.1)
Basophils Relative: 0 % (ref 0–1)
EOS ABS: 0.3 10*3/uL (ref 0.0–0.7)
EOS%: 1 % (ref 0.0–7.0)
Eosinophils Absolute: 0.3 10*3/uL (ref 0.0–0.5)
Eosinophils Relative: 1 % (ref 0–5)
HCT: 29.3 % — ABNORMAL LOW (ref 34.8–46.6)
HCT: 30.1 % — ABNORMAL LOW (ref 36.0–46.0)
HEMOGLOBIN: 8.8 g/dL — AB (ref 11.6–15.9)
Hemoglobin: 9 g/dL — ABNORMAL LOW (ref 12.0–15.0)
LYMPH%: 46.8 % (ref 14.0–49.7)
LYMPHS PCT: 44 % (ref 12–46)
Lymphs Abs: 12.9 10*3/uL — ABNORMAL HIGH (ref 0.7–4.0)
MCH: 22.7 pg — AB (ref 25.1–34.0)
MCH: 23.3 pg — AB (ref 26.0–34.0)
MCHC: 30.1 g/dL — ABNORMAL LOW (ref 31.5–36.0)
MCHC: 29.9 g/dL — AB (ref 30.0–36.0)
MCV: 75.5 fL — AB (ref 79.5–101.0)
MCV: 77.8 fL — ABNORMAL LOW (ref 78.0–100.0)
MONO ABS: 1.2 10*3/uL — AB (ref 0.1–1.0)
MONO#: 0.9 10*3/uL (ref 0.1–0.9)
MONO%: 3.3 % (ref 0.0–14.0)
Monocytes Relative: 4 % (ref 3–12)
NEUT#: 12.4 10*3/uL — ABNORMAL HIGH (ref 1.5–6.5)
NEUT%: 47.6 % (ref 38.4–76.8)
NEUTROS PCT: 51 % (ref 43–77)
Neutro Abs: 15 10*3/uL — ABNORMAL HIGH (ref 1.7–7.7)
PLATELETS: 469 10*3/uL — AB (ref 145–400)
Platelets: 497 10*3/uL — ABNORMAL HIGH (ref 150–400)
RBC: 3.88 10*6/uL (ref 3.70–5.45)
RBC: 3.87 MIL/uL (ref 3.87–5.11)
RDW: 15.3 % — AB (ref 11.2–14.5)
RDW: 14.8 % (ref 11.5–15.5)
WBC: 26 10*3/uL — ABNORMAL HIGH (ref 3.9–10.3)
WBC: 29.4 10*3/uL — ABNORMAL HIGH (ref 4.0–10.5)
lymph#: 12.2 10*3/uL — ABNORMAL HIGH (ref 0.9–3.3)

## 2014-07-06 LAB — I-STAT TROPONIN, ED
TROPONIN I, POC: 0 ng/mL (ref 0.00–0.08)
Troponin i, poc: 0 ng/mL (ref 0.00–0.08)

## 2014-07-06 LAB — I-STAT CHEM 8, ED
BUN: 11 mg/dL (ref 6–23)
Calcium, Ion: 1.19 mmol/L (ref 1.13–1.30)
Chloride: 99 mEq/L (ref 96–112)
Creatinine, Ser: 0.6 mg/dL (ref 0.50–1.10)
Glucose, Bld: 221 mg/dL — ABNORMAL HIGH (ref 70–99)
HCT: 30 % — ABNORMAL LOW (ref 36.0–46.0)
HEMOGLOBIN: 10.2 g/dL — AB (ref 12.0–15.0)
Potassium: 3.8 mEq/L (ref 3.7–5.3)
Sodium: 133 mEq/L — ABNORMAL LOW (ref 137–147)
TCO2: 25 mmol/L (ref 0–100)

## 2014-07-06 LAB — CBG MONITORING, ED: GLUCOSE-CAPILLARY: 233 mg/dL — AB (ref 70–99)

## 2014-07-06 LAB — POC OCCULT BLOOD, ED: FECAL OCCULT BLD: POSITIVE — AB

## 2014-07-06 MED ORDER — SODIUM CHLORIDE 0.9 % IV BOLUS (SEPSIS)
1000.0000 mL | Freq: Once | INTRAVENOUS | Status: AC
  Administered 2014-07-06: 1000 mL via INTRAVENOUS

## 2014-07-06 MED ORDER — ONDANSETRON HCL 4 MG/2ML IJ SOLN
4.0000 mg | Freq: Once | INTRAMUSCULAR | Status: AC
  Administered 2014-07-06: 4 mg via INTRAVENOUS
  Filled 2014-07-06: qty 2

## 2014-07-06 NOTE — ED Notes (Signed)
Bed: WA22 Expected date:  Expected time:  Means of arrival:  Comments: 

## 2014-07-06 NOTE — ED Notes (Signed)
Patient attempted to stand for orthostatic vitals, states "I'm swimmy headed, I need to sit down." Patient assisted to sitting/and lying position. RN Domingo Dimes notified

## 2014-07-06 NOTE — ED Provider Notes (Signed)
CSN: [id number]     Arrival date & time 07/06/14  1700 History   First MD Initiated Contact with Patient 07/06/14 1745     Chief Complaint  Patient presents with  . Dizziness  . Emesis     (Consider location/radiation/quality/duration/timing/severity/associated sxs/prior Treatment) Patient is a 80 y.o. female presenting with vomiting and syncope.  Emesis Associated symptoms: no abdominal pain, no chills, no diarrhea, no headaches and no sore throat   Loss of Consciousness Episode history:  Single Most recent episode:  Today Duration: brief. Timing:  Constant Progression:  Partially resolved Chronicity:  New Context: sitting down   Witnessed: yes   Relieved by:  Nothing Worsened by:  Nothing tried Ineffective treatments:  None tried Associated symptoms: dizziness, malaise/fatigue, nausea and vomiting   Associated symptoms: no chest pain, no confusion, no fever, no focal weakness, no headaches and no shortness of breath     Past Medical History  Diagnosis Date  . Diabetes mellitus   . Hypertension   . Arthritis   . MVP (mitral valve prolapse)   . Hx of breast cancer     RIGHT BREAST  . Hypothyroidism    Past Surgical History  Procedure Laterality Date  . Hemiarthroplasty hip    . Knee arthroscopy      RIGHT KNEE  . Total abdominal hysterectomy w/ bilateral salpingoophorectomy    . Pelvic and para-aortic lymph node dissection    . Tonsillectomy    . Appendectomy    . Cholecystectomy    . US echocardiography  03/30/2009    EF 55-60%  . Cardiovascular stress test  01/28/2008    EF 74%   Family History  Problem Relation Age of Onset  . Hypertension Mother   . Heart attack Father   . Hypertension Father   . Diabetes Sister   . Hypertension Sister   . Liver cancer Sister   . Heart attack Brother   . Hypertension Brother   . Diabetes Brother   . Diabetes Sister   . Hypertension Sister    History  Substance Use Topics  . Smoking status: Never Smoker   .  Smokeless tobacco: Not on file  . Alcohol Use: No   OB History   Grav Para Term Preterm Abortions TAB SAB Ect Mult Living                 Review of Systems  Constitutional: Positive for malaise/fatigue. Negative for fever and chills.  HENT: Negative for congestion, rhinorrhea and sore throat.   Eyes: Negative for photophobia and visual disturbance.  Respiratory: Negative for cough and shortness of breath.   Cardiovascular: Positive for syncope. Negative for chest pain and leg swelling.  Gastrointestinal: Positive for nausea and vomiting. Negative for abdominal pain, diarrhea and constipation.  Endocrine: Negative for polyphagia and polyuria.  Genitourinary: Negative for dysuria, flank pain, vaginal bleeding, vaginal discharge and enuresis.  Musculoskeletal: Negative for back pain and gait problem.  Skin: Negative for color change and rash.  Neurological: Positive for dizziness. Negative for focal weakness, syncope, light-headedness, numbness and headaches.  Hematological: Negative for adenopathy. Does not bruise/bleed easily.  Psychiatric/Behavioral: Negative for confusion.  All other systems reviewed and are negative.     Allergies  Codeine; Demerol; Librium; Lipitor; and Metoprolol  Home Medications   Prior to Admission medications   Medication Sig Start Date End Date Taking? Authorizing Provider  ACCU-CHEK FASTCLIX LANCETS [place]  04/28/14  Yes Historical Provider, MD  amLODipine-valsartan (EXFORGE) 10-320 MG per  tablet Take 1 tablet by mouth daily. 02/17/14  Yes Sueanne Margarita, MD  anastrozole (ARIMIDEX) 1 MG tablet Take 1 mg by mouth daily.   Yes Historical Provider, MD  aspirin EC 81 MG tablet Take 81 mg by mouth daily.   Yes Historical Provider, MD  atenolol (TENORMIN) 25 MG tablet Take 1 tablet (25 mg total) by mouth 2 (two) times daily. 03/10/14  Yes Darlin Coco, MD  Calcium Carbonate-Vitamin D (CALCIUM + D PO) Take by mouth daily after lunch.    Yes Historical  Provider, MD  cholecalciferol (VITAMIN D) 1000 UNITS tablet Take 1,000 Units by mouth daily.   Yes Historical Provider, MD  ciprofloxacin (CIPRO) 500 MG tablet Take 1 tablet (500 mg total) by mouth 2 (two) times daily. 04/27/14  Yes Noland Fordyce, PA-C  Cyanocobalamin (VITAMIN B-12 PO) Take by mouth daily.     Yes Historical Provider, MD  fluticasone (FLONASE) 50 MCG/ACT nasal spray Place 2 sprays into the nose daily as needed for allergies or rhinitis.  11/26/11  Yes Historical Provider, MD  glimepiride (AMARYL) 4 MG tablet Take 4 mg by mouth daily with lunch.   Yes Historical Provider, MD  Glucose Blood (FREESTYLE LITE TEST VI) 3 (three) times daily as needed (blood sugar monitoring.).  08/27/13  Yes Historical Provider, MD  levothyroxine (SYNTHROID, LEVOTHROID) 75 MCG tablet Take 75 mcg by mouth daily.     Yes Historical Provider, MD  metFORMIN (GLUCOPHAGE) 500 MG tablet Take 500 mg by mouth 2 (two) times daily. With lunch and dinner.   Yes Historical Provider, MD  metroNIDAZOLE (FLAGYL) 500 MG tablet Take 500 mg by mouth 2 (two) times daily. 04/27/14  Yes Noland Fordyce, PA-C  pantoprazole (PROTONIX) 40 MG tablet Take 40 mg by mouth daily as needed (acid).    Yes Historical Provider, MD  potassium chloride (K-DUR) 10 MEQ tablet Take 10 mEq by mouth daily.   Yes Historical Provider, MD  ZETIA 10 MG tablet Take 10 mg by mouth Daily.  01/31/12  Yes Historical Provider, MD   BP 167/55  Pulse 68  Temp(Src) 98.2 F (36.8 C) (Oral)  Resp 20  Ht 5\' 5"  (1.651 m)  Wt 200 lb 1.6 oz (90.765 kg)  BMI 33.30 kg/m2  SpO2 95% Physical Exam  Vitals reviewed. Constitutional: She is oriented to person, place, and time. She appears well-developed and well-nourished.  HENT:  Head: Normocephalic and atraumatic.  Right Ear: External ear normal.  Left Ear: External ear normal.  Eyes: Conjunctivae and EOM are normal. Pupils are equal, round, and reactive to light.  Neck: Normal range of motion. Neck supple.   Cardiovascular: Normal rate, regular rhythm, normal heart sounds and intact distal pulses.   Pulmonary/Chest: Effort normal and breath sounds normal.  Abdominal: Soft. Bowel sounds are normal. There is no tenderness.  Musculoskeletal: Normal range of motion.  Neurological: She is alert and oriented to person, place, and time. She has normal strength. No cranial nerve deficit or sensory deficit. GCS eye subscore is 4. GCS verbal subscore is 5. GCS motor subscore is 6.  Skin: Skin is warm and dry.    ED Course  Procedures (including critical care time) Labs Review Labs Reviewed  CBC WITH DIFFERENTIAL - Abnormal; Notable for the following:    WBC 29.4 (*)    Hemoglobin 9.0 (*)    HCT 30.1 (*)    MCV 77.8 (*)    MCH 23.3 (*)    MCHC 29.9 (*)  Platelets 497 (*)    Neutro Abs 15.0 (*)    Lymphs Abs 12.9 (*)    Monocytes Absolute 1.2 (*)    All other components within normal limits  CBC - Abnormal; Notable for the following:    WBC 27.0 (*)    RBC 3.71 (*)    Hemoglobin 8.6 (*)    HCT 28.2 (*)    MCV 76.0 (*)    MCH 23.2 (*)    Platelets 460 (*)    All other components within normal limits  CBC - Abnormal; Notable for the following:    WBC 26.9 (*)    RBC 3.65 (*)    Hemoglobin 8.6 (*)    HCT 27.2 (*)    MCV 74.5 (*)    MCH 23.6 (*)    Platelets 479 (*)    All other components within normal limits  COMPREHENSIVE METABOLIC PANEL - Abnormal; Notable for the following:    Sodium 133 (*)    Glucose, Bld 201 (*)    Albumin 3.1 (*)    GFR calc non Af Amer 78 (*)    All other components within normal limits  TROPONIN I - Abnormal; Notable for the following:    Troponin I 0.42 (*)    All other components within normal limits  TROPONIN I - Abnormal; Notable for the following:    Troponin I 0.70 (*)    All other components within normal limits  TROPONIN I - Abnormal; Notable for the following:    Troponin I 1.50 (*)    All other components within normal limits  GLUCOSE,  CAPILLARY - Abnormal; Notable for the following:    Glucose-Capillary 189 (*)    All other components within normal limits  GLUCOSE, CAPILLARY - Abnormal; Notable for the following:    Glucose-Capillary 188 (*)    All other components within normal limits  GLUCOSE, CAPILLARY - Abnormal; Notable for the following:    Glucose-Capillary 183 (*)    All other components within normal limits  TROPONIN I - Abnormal; Notable for the following:    Troponin I 1.05 (*)    All other components within normal limits  TROPONIN I - Abnormal; Notable for the following:    Troponin I 0.84 (*)    All other components within normal limits  HEMOGLOBIN A1C - Abnormal; Notable for the following:    Hemoglobin A1C 9.1 (*)    Mean Plasma Glucose 214 (*)    All other components within normal limits  LIPID PANEL - Abnormal; Notable for the following:    LDL Cholesterol 122 (*)    All other components within normal limits  HEMOGLOBIN AND HEMATOCRIT, BLOOD - Abnormal; Notable for the following:    Hemoglobin 8.3 (*)    HCT 27.0 (*)    All other components within normal limits  APTT - Abnormal; Notable for the following:    aPTT 82 (*)    All other components within normal limits  GLUCOSE, CAPILLARY - Abnormal; Notable for the following:    Glucose-Capillary 158 (*)    All other components within normal limits  GLUCOSE, CAPILLARY - Abnormal; Notable for the following:    Glucose-Capillary 154 (*)    All other components within normal limits  GLUCOSE, CAPILLARY - Abnormal; Notable for the following:    Glucose-Capillary 133 (*)    All other components within normal limits  CBG MONITORING, ED - Abnormal; Notable for the following:    Glucose-Capillary 233 (*)  All other components within normal limits  I-STAT CHEM 8, ED - Abnormal; Notable for the following:    Sodium 133 (*)    Glucose, Bld 221 (*)    Hemoglobin 10.2 (*)    HCT 30.0 (*)    All other components within normal limits  POC OCCULT  BLOOD, ED - Abnormal; Notable for the following:    Fecal Occult Bld POSITIVE (*)    All other components within normal limits  LIPASE, BLOOD  TSH  PROTIME-INR  BASIC METABOLIC PANEL  CBC  I-STAT TROPOININ, ED  I-STAT TROPOININ, ED  TYPE AND SCREEN    Imaging Review Dg Chest 2 View  07/06/2014   CLINICAL DATA:  Dizziness.  Nausea.  Hypertension.  Diabetic.  EXAM: CHEST  2 VIEW  COMPARISON:  09/2010  FINDINGS: Apical lordotic portable view. Numerous leads and wires project over the chest. Normal heart size. No pleural effusion or pneumothorax. Low lung volumes with resultant pulmonary interstitial prominence. Clear lungs.  IMPRESSION: No acute cardiopulmonary disease.   Electronically Signed   By: Abigail Miyamoto M.D.   On: 07/06/2014 19:06     EKG Interpretation None      MDM   Final diagnoses:  Gastrointestinal hemorrhage, unspecified gastritis, unspecified gastrointestinal hemorrhage type  Anemia, blood loss  Syncope, unspecified syncope type  Type II or unspecified type diabetes mellitus without mention of complication, uncontrolled  Essential hypertension  RBBB  NSTEMI (non-ST elevated myocardial infarction)    80 y.o. female  with pertinent PMH of DM, HTN presents with syncopal episode x 1.  Pt has been having near syncopal episodes over the last few months, then today had a syncopal episode preceded by antecedent lightheadedness.  No ho CHF (reportedly unremarkable cardiology visit), chest pain, or other concerning findings.  Also no fever, cough, or infectious symptoms.  On arrival vitals signs and physical exam as above.  Pt complains of only minor lightheadedness.  Labs as above with persistent anemia, hemoccult positive.  Consulted hospitalist for admission for symptomatic anemia.    Labs and imaging as above reviewed.   1. Gastrointestinal hemorrhage, unspecified gastritis, unspecified gastrointestinal hemorrhage type   2. Anemia, blood loss   3. Syncope, unspecified  syncope type   4. Type II or unspecified type diabetes mellitus without mention of complication, uncontrolled   5. Essential hypertension   6. RBBB   7. NSTEMI (non-ST elevated myocardial infarction)         Debby Freiberg, MD 07/08/14 [phone]

## 2014-07-06 NOTE — Progress Notes (Addendum)
**Molly Molly** Molly Molly   Diagnosis:  Breast cancer and CLL.  INTERVAL HISTORY:   Molly Molly returns as scheduled. She feels weak. She has occasional dizzy spells. No fevers. She continues to have intermittent night sweats. Stable bilateral hip pain. Bowels overall moving regularly. No bloody or black stools. She denies upper abdominal pain. No hematemesis. She denies shortness of breath and chest pain.  Objective:  Vital signs in last 24 hours:  Blood pressure 157/51, pulse 69, temperature 97.9 F (36.6 C), temperature source Oral, resp. rate 20, height 5\' 4"  (1.626 m), weight 200 lb 1.6 oz (90.765 kg).   She requested examination in the wheelchair as she did not feel she could safely maneuver onto the exam table. HEENT: No thrush or ulcers. Lymphatics: No palpable cervical, supraclavicular or axillary lymph nodes. Resp: Lungs clear bilaterally. Cardio: Regular rate and rhythm. GI: Abdomen is soft. Tender at the right mid abdomen. No obvious hepatomegaly. Vascular: No leg edema. Neuro: Alert and oriented.   Lab Results:  Lab Results  Component Value Date   WBC 26.0* 07/06/2014   HGB 8.8* 07/06/2014   HCT 29.3* 07/06/2014   MCV 75.5* 07/06/2014   PLT 469* 07/06/2014   NEUTROABS 12.4* 07/06/2014   CBG 195. Ferritin pending. 04/13/2014 CA 27-29 18. Imaging:  No results found.  Medications: I have reviewed the patient's current medications.  Assessment/Plan: 1. Breast cancer, right-sided breast cancer (T2 N2) diagnosed in 2009, status post a right lumpectomy and adjuvant radiation. She was diagnosed with a pathologic left femur fracture in November 2011. A staging bone scan and CT scan in November 2011 confirmed multiple bone metastases and a cystic metastasis at the left iliac muscle. No visceral metastases were seen. She began Arimidex following an office visit 11/07/2010. The Arimidex was held for approximately 1 week after an office visit on  02/09/2011 and then resumed on 02/16/2011.  a. History of elevated CA 27-29. The CA 27.29 was normal on 09/21/2013. b. Bone scan 11/04/2013 with stable multifocal areas of bone metastasis. No new or progressive disease identified. 2. Status post a left hip hemiarthroplasty for treatment of a pathologic fracture 10/07/2010. 3. Status post radiation to the left proximal femur 12/22 through 11/17/2010. 4. Diabetes. 5. Hypertension. 6. History of arthralgias, likely related to degenerative arthritis 7. Leukocytosis/lymphocytosis-chronic. Review the peripheral blood smear from 02/01/2012 was consistent with a diagnosis of chronic lymphocytic leukemia. A peripheral blood sample was submitted for flow cytometry on 06/03/2012. This confirmed a diagnosis of chronic lymphocytic leukemia. 8. Recurrent "bladder" pain-relieved with ciprofloxacin, it is not clear the pain is related to urinary tract infection, but she insists the pain is relieved with ciprofloxacin. 9. Left abdomen/iliac pain-etiology unclear. 10. Cystic left pelvic lesion. Present on x-ray studies dating to 2002. 11. Anemia 04/13/2014. Progressive, microcytic, 07/06/2014. 12. Heme positive stool 04/13/2014. 13. Night sweats. Question related to CLL, question diabetes.   Disposition: Molly Molly has a progressive microcytic anemia in the setting of guaiac-positive stool. Dr. Benay Spice recommends a referral to gastroenterology. She has seen Dr. Amedeo Plenty in the past. She will begin hemocyte twice daily. If she is unable to tolerate oral iron she will contact the office and we will schedule her to receive intravenous iron. She will return for a CBC in 7-10 days. She will return for a followup visit in one month.  She became "faint" while scheduling her followup appointments. She felt as if she was going to "pass out". Blood pressure 112/50; CBG 195.  She vomited brown liquid which did not appear bloody. She and her daughter requested evaluation in the  emergency Department. We contacted the emergency department charge nurse and she was transported there by wheelchair.  Patient seen with Dr. Benay Spice.   Ned Card ANP/GNP-BC   07/06/2014  4:45 PM This was a shared this with Ned Card. Molly Molly has developed symptomatic microcytic anemia. She appears to have iron deficiency anemia. She denies bleeding. The plan was to refer her to Dr. Amedeo Plenty for a GI evaluation and begin iron replacement. However she developed presyncope and nausea/vomiting prior to leaving the office today. She was referred to the emergency room for further evaluation.  Julieanne Manson, M.D.

## 2014-07-06 NOTE — Telephone Encounter (Signed)
Pt sched to see Dr. Amedeo Plenty on 9.11 @ 2:45pm.....gv and printed appt sched and avs for pt for Sept

## 2014-07-06 NOTE — ED Notes (Addendum)
Hx of diabetes, and previous cancer- resolved. Pt was being seen in cancer center for check up of anemia. Pt was being discharged and reported feeling dizzy. Pt vomited once. Upon arrival to ED. Pt denies pain, denies SOB. Reports her head feels like it is swimming.   Cancer center reports CBG 195

## 2014-07-06 NOTE — ED Notes (Signed)
Pt is currently on a bedpan.

## 2014-07-06 NOTE — Patient Instructions (Addendum)
Begin hemocyte (oral iron) twice daily. Call office if unable to tolerate oral iron.

## 2014-07-07 ENCOUNTER — Encounter (HOSPITAL_COMMUNITY): Payer: Self-pay | Admitting: Internal Medicine

## 2014-07-07 ENCOUNTER — Telehealth: Payer: Self-pay | Admitting: Oncology

## 2014-07-07 ENCOUNTER — Other Ambulatory Visit: Payer: Self-pay

## 2014-07-07 DIAGNOSIS — IMO0001 Reserved for inherently not codable concepts without codable children: Secondary | ICD-10-CM

## 2014-07-07 DIAGNOSIS — I4510 Unspecified right bundle-branch block: Secondary | ICD-10-CM

## 2014-07-07 DIAGNOSIS — I059 Rheumatic mitral valve disease, unspecified: Secondary | ICD-10-CM

## 2014-07-07 DIAGNOSIS — E1165 Type 2 diabetes mellitus with hyperglycemia: Secondary | ICD-10-CM

## 2014-07-07 DIAGNOSIS — R55 Syncope and collapse: Secondary | ICD-10-CM

## 2014-07-07 DIAGNOSIS — I10 Essential (primary) hypertension: Secondary | ICD-10-CM

## 2014-07-07 DIAGNOSIS — K922 Gastrointestinal hemorrhage, unspecified: Secondary | ICD-10-CM

## 2014-07-07 DIAGNOSIS — I214 Non-ST elevation (NSTEMI) myocardial infarction: Secondary | ICD-10-CM

## 2014-07-07 LAB — APTT: aPTT: 82 seconds — ABNORMAL HIGH (ref 24–37)

## 2014-07-07 LAB — COMPREHENSIVE METABOLIC PANEL
ALBUMIN: 3.1 g/dL — AB (ref 3.5–5.2)
ALK PHOS: 99 U/L (ref 39–117)
ALT: 14 U/L (ref 0–35)
AST: 16 U/L (ref 0–37)
Anion gap: 15 (ref 5–15)
BUN: 10 mg/dL (ref 6–23)
CALCIUM: 9.1 mg/dL (ref 8.4–10.5)
CO2: 21 mEq/L (ref 19–32)
Chloride: 97 mEq/L (ref 96–112)
Creatinine, Ser: 0.63 mg/dL (ref 0.50–1.10)
GFR calc Af Amer: >90 mL/min (ref >90)
GFR calc non Af Amer: 78 mL/min — ABNORMAL LOW (ref >90)
Glucose, Bld: 201 mg/dL — ABNORMAL HIGH (ref 70–99)
POTASSIUM: 4.3 meq/L (ref 3.7–5.3)
SODIUM: 133 meq/L — AB (ref 137–147)
TOTAL PROTEIN: 6.2 g/dL (ref 6.0–8.3)
Total Bilirubin: 0.3 mg/dL (ref 0.3–1.2)

## 2014-07-07 LAB — TROPONIN I
TROPONIN I: 1.5 ng/mL — AB (ref <0.30)
TROPONIN I: 1.05 ng/mL — AB (ref <0.30)
Troponin I: 0.42 ng/mL (ref <0.30)
Troponin I: 0.7 ng/mL (ref <0.30)
Troponin I: 0.84 ng/mL (ref <0.30)

## 2014-07-07 LAB — HEMOGLOBIN A1C
Hgb A1c MFr Bld: 9.1 % — ABNORMAL HIGH (ref <5.7)
MEAN PLASMA GLUCOSE: 214 mg/dL — AB (ref <117)

## 2014-07-07 LAB — TSH: TSH: 1.43 u[IU]/mL (ref 0.350–4.500)

## 2014-07-07 LAB — CBC
HCT: 28.2 % — ABNORMAL LOW (ref 36.0–46.0)
HEMATOCRIT: 27.2 % — AB (ref 36.0–46.0)
Hemoglobin: 8.6 g/dL — ABNORMAL LOW (ref 12.0–15.0)
Hemoglobin: 8.6 g/dL — ABNORMAL LOW (ref 12.0–15.0)
MCH: 23.2 pg — ABNORMAL LOW (ref 26.0–34.0)
MCH: 23.6 pg — ABNORMAL LOW (ref 26.0–34.0)
MCHC: 30.5 g/dL (ref 30.0–36.0)
MCHC: 31.6 g/dL (ref 30.0–36.0)
MCV: 74.5 fL — AB (ref 78.0–100.0)
MCV: 76 fL — AB (ref 78.0–100.0)
PLATELETS: 460 10*3/uL — AB (ref 150–400)
PLATELETS: 479 10*3/uL — AB (ref 150–400)
RBC: 3.65 MIL/uL — ABNORMAL LOW (ref 3.87–5.11)
RBC: 3.71 MIL/uL — AB (ref 3.87–5.11)
RDW: 14.5 % (ref 11.5–15.5)
RDW: 14.6 % (ref 11.5–15.5)
WBC: 26.9 10*3/uL — AB (ref 4.0–10.5)
WBC: 27 10*3/uL — ABNORMAL HIGH (ref 4.0–10.5)

## 2014-07-07 LAB — PROTIME-INR
INR: 1.2 (ref 0.00–1.49)
PROTHROMBIN TIME: 15.2 s (ref 11.6–15.2)

## 2014-07-07 LAB — LIPASE, BLOOD: LIPASE: 23 U/L (ref 11–59)

## 2014-07-07 LAB — HEMOGLOBIN AND HEMATOCRIT, BLOOD
HCT: 27 % — ABNORMAL LOW (ref 36.0–46.0)
HEMOGLOBIN: 8.3 g/dL — AB (ref 12.0–15.0)

## 2014-07-07 LAB — LIPID PANEL
Cholesterol: 181 mg/dL (ref 0–200)
HDL: 43 mg/dL (ref >39)
LDL Cholesterol: 122 mg/dL — ABNORMAL HIGH (ref 0–99)
Total CHOL/HDL Ratio: 4.2 RATIO
Triglycerides: 79 mg/dL (ref <150)
VLDL: 16 mg/dL (ref 0–40)

## 2014-07-07 LAB — GLUCOSE, CAPILLARY
GLUCOSE-CAPILLARY: 189 mg/dL — AB (ref 70–99)
GLUCOSE-CAPILLARY: 183 mg/dL — AB (ref 70–99)
GLUCOSE-CAPILLARY: 158 mg/dL — AB (ref 70–99)
GLUCOSE-CAPILLARY: 154 mg/dL — AB (ref 70–99)
Glucose-Capillary: 188 mg/dL — ABNORMAL HIGH (ref 70–99)
Glucose-Capillary: 133 mg/dL — ABNORMAL HIGH (ref 70–99)

## 2014-07-07 LAB — FERRITIN CHCC: Ferritin: 9 ng/ml (ref 9–269)

## 2014-07-07 MED ORDER — HEPARIN BOLUS VIA INFUSION
4000.0000 [IU] | Freq: Once | INTRAVENOUS | Status: AC
  Administered 2014-07-07: 4000 [IU] via INTRAVENOUS
  Filled 2014-07-07: qty 4000

## 2014-07-07 MED ORDER — LEVOTHYROXINE SODIUM 75 MCG PO TABS
75.0000 ug | ORAL_TABLET | Freq: Every day | ORAL | Status: DC
  Administered 2014-07-08 – 2014-07-11 (×3): 75 ug via ORAL
  Filled 2014-07-07 (×6): qty 1

## 2014-07-07 MED ORDER — SODIUM CHLORIDE 0.9 % IV SOLN
INTRAVENOUS | Status: AC
  Administered 2014-07-07: 01:00:00 via INTRAVENOUS

## 2014-07-07 MED ORDER — HEPARIN (PORCINE) IN NACL 100-0.45 UNIT/ML-% IJ SOLN
900.0000 [IU]/h | INTRAMUSCULAR | Status: DC
  Administered 2014-07-07: 900 [IU]/h via INTRAVENOUS
  Filled 2014-07-07: qty 250

## 2014-07-07 MED ORDER — DIPHENHYDRAMINE HCL 25 MG PO CAPS: 25.0000 mg | ORAL_CAPSULE | Freq: Every evening | ORAL | Status: DC | PRN

## 2014-07-07 MED ORDER — VITAMIN B-12 1000 MCG PO TABS
1000.0000 ug | ORAL_TABLET | Freq: Every day | ORAL | Status: DC
  Administered 2014-07-08 – 2014-07-11 (×4): 1000 ug via ORAL
  Filled 2014-07-07 (×4): qty 1

## 2014-07-07 MED ORDER — ASPIRIN EC 81 MG PO TBEC
81.0000 mg | DELAYED_RELEASE_TABLET | Freq: Every day | ORAL | Status: DC
  Administered 2014-07-08 – 2014-07-11 (×4): 81 mg via ORAL
  Filled 2014-07-07 (×4): qty 1

## 2014-07-07 MED ORDER — ANASTROZOLE 1 MG PO TABS
1.0000 mg | ORAL_TABLET | Freq: Every day | ORAL | Status: DC
  Administered 2014-07-07 – 2014-07-11 (×5): 1 mg via ORAL
  Filled 2014-07-07 (×5): qty 1

## 2014-07-07 MED ORDER — SODIUM CHLORIDE 0.9 % IV SOLN
80.0000 mg | Freq: Once | INTRAVENOUS | Status: AC
  Administered 2014-07-07: 80 mg via INTRAVENOUS
  Filled 2014-07-07: qty 80

## 2014-07-07 MED ORDER — ROSUVASTATIN CALCIUM 40 MG PO TABS
40.0000 mg | ORAL_TABLET | Freq: Every day | ORAL | Status: DC
  Filled 2014-07-07 (×2): qty 1

## 2014-07-07 MED ORDER — FLUTICASONE PROPIONATE 50 MCG/ACT NA SUSP
2.0000 | Freq: Every day | NASAL | Status: DC | PRN
  Filled 2014-07-07: qty 16

## 2014-07-07 MED ORDER — CALCIUM CARBONATE-VITAMIN D 500-200 MG-UNIT PO TABS
1.0000 | ORAL_TABLET | Freq: Every day | ORAL | Status: DC
  Administered 2014-07-08 – 2014-07-10 (×3): 1 via ORAL
  Filled 2014-07-07 (×4): qty 1

## 2014-07-07 MED ORDER — PANTOPRAZOLE SODIUM 40 MG IV SOLR: 40.0000 mg | Freq: Two times a day (BID) | INTRAVENOUS | Status: DC

## 2014-07-07 MED ORDER — EZETIMIBE 10 MG PO TABS
10.0000 mg | ORAL_TABLET | Freq: Every day | ORAL | Status: DC
  Administered 2014-07-07 – 2014-07-11 (×5): 10 mg via ORAL
  Filled 2014-07-07 (×5): qty 1

## 2014-07-07 MED ORDER — NON FORMULARY: 40.0000 mg | Freq: Every day | Status: DC

## 2014-07-07 MED ORDER — INSULIN ASPART 100 UNIT/ML ~~LOC~~ SOLN
0.0000 [IU] | Freq: Three times a day (TID) | SUBCUTANEOUS | Status: DC
  Administered 2014-07-07 (×3): 2 [IU] via SUBCUTANEOUS
  Administered 2014-07-08: 3 [IU] via SUBCUTANEOUS
  Administered 2014-07-08 – 2014-07-09 (×4): 2 [IU] via SUBCUTANEOUS
  Administered 2014-07-10: 1 [IU] via SUBCUTANEOUS
  Administered 2014-07-10: 2 [IU] via SUBCUTANEOUS
  Administered 2014-07-10: 1 [IU] via SUBCUTANEOUS
  Administered 2014-07-11: 2 [IU] via SUBCUTANEOUS

## 2014-07-07 MED ORDER — SODIUM CHLORIDE 0.9 % IJ SOLN
3.0000 mL | Freq: Two times a day (BID) | INTRAMUSCULAR | Status: DC
  Administered 2014-07-07 – 2014-07-11 (×7): 3 mL via INTRAVENOUS

## 2014-07-07 MED ORDER — POTASSIUM CHLORIDE CRYS ER 10 MEQ PO TBCR
10.0000 meq | EXTENDED_RELEASE_TABLET | Freq: Every day | ORAL | Status: DC
  Administered 2014-07-08 – 2014-07-11 (×4): 10 meq via ORAL
  Filled 2014-07-07 (×4): qty 1

## 2014-07-07 MED ORDER — ONDANSETRON HCL 4 MG/2ML IJ SOLN: 4.0000 mg | Freq: Four times a day (QID) | INTRAMUSCULAR | Status: DC | PRN

## 2014-07-07 MED ORDER — ACETAMINOPHEN 650 MG RE SUPP: 650.0000 mg | Freq: Four times a day (QID) | RECTAL | Status: DC | PRN

## 2014-07-07 MED ORDER — ONDANSETRON HCL 4 MG PO TABS: 4.0000 mg | ORAL_TABLET | Freq: Four times a day (QID) | ORAL | Status: DC | PRN

## 2014-07-07 MED ORDER — SODIUM CHLORIDE 0.9 % IV SOLN
8.0000 mg/h | INTRAVENOUS | Status: DC
  Administered 2014-07-07 – 2014-07-09 (×5): 8 mg/h via INTRAVENOUS
  Filled 2014-07-07 (×14): qty 80

## 2014-07-07 MED ORDER — ACETAMINOPHEN 325 MG PO TABS: 650.0000 mg | ORAL_TABLET | Freq: Four times a day (QID) | ORAL | Status: DC | PRN

## 2014-07-07 MED ORDER — HYDRALAZINE HCL 20 MG/ML IJ SOLN: 10.0000 mg | INTRAMUSCULAR | Status: DC | PRN

## 2014-07-07 MED ORDER — SODIUM CHLORIDE 0.9 % IV SOLN: INTRAVENOUS | Status: DC

## 2014-07-07 MED ORDER — AMLODIPINE BESYLATE 10 MG PO TABS
10.0000 mg | ORAL_TABLET | Freq: Every day | ORAL | Status: DC
  Administered 2014-07-07 – 2014-07-11 (×5): 10 mg via ORAL
  Filled 2014-07-07 (×5): qty 1

## 2014-07-07 MED ORDER — IRBESARTAN 300 MG PO TABS
300.0000 mg | ORAL_TABLET | Freq: Every day | ORAL | Status: DC
  Administered 2014-07-07 – 2014-07-11 (×5): 300 mg via ORAL
  Filled 2014-07-07 (×5): qty 1

## 2014-07-07 MED ORDER — ZOLPIDEM TARTRATE 5 MG PO TABS
5.0000 mg | ORAL_TABLET | Freq: Every evening | ORAL | Status: DC | PRN
  Administered 2014-07-07: 5 mg via ORAL
  Filled 2014-07-07: qty 1

## 2014-07-07 MED ORDER — VITAMIN D3 25 MCG (1000 UNIT) PO TABS
1000.0000 [IU] | ORAL_TABLET | Freq: Every day | ORAL | Status: DC
  Administered 2014-07-08 – 2014-07-11 (×4): 1000 [IU] via ORAL
  Filled 2014-07-07 (×4): qty 1

## 2014-07-07 MED ORDER — ASPIRIN 81 MG PO CHEW
324.0000 mg | CHEWABLE_TABLET | Freq: Once | ORAL | Status: AC
  Administered 2014-07-07: 324 mg via ORAL
  Filled 2014-07-07: qty 4

## 2014-07-07 MED ORDER — LEVOTHYROXINE SODIUM 100 MCG IV SOLR
37.5000 ug | Freq: Every day | INTRAVENOUS | Status: DC
  Administered 2014-07-07: 37.5 ug via INTRAVENOUS
  Filled 2014-07-07: qty 5

## 2014-07-07 MED ORDER — NITROGLYCERIN 0.4 MG SL SUBL: 0.4000 mg | SUBLINGUAL_TABLET | SUBLINGUAL | Status: DC | PRN

## 2014-07-07 MED ORDER — ATENOLOL 25 MG PO TABS
25.0000 mg | ORAL_TABLET | Freq: Two times a day (BID) | ORAL | Status: DC
  Administered 2014-07-07 (×2): 25 mg via ORAL
  Filled 2014-07-07 (×4): qty 1

## 2014-07-07 NOTE — Progress Notes (Signed)
CRITICAL VALUE ALERT  Critical value received:  Troponin 0.42  Date of notification:  07/07/2014  Time of notification:  0145  Critical value read back:Yes.    Nurse who received alert:  Carnella Guadalajara I  MD notified (1st page):  K. Schorr  Time of first page:  0154  K. Schorr gave orders to page Hal Hope, MD with EKG results.  MD notified (2nd page): Hal Hope, MD  Time of second page: 0217  Responding MD:  Hal Hope, MD  Time MD responded:  [phone]  Hal Hope gave orders to do troponin x3. Will continue to monitor pt closely. Carnella Guadalajara I

## 2014-07-07 NOTE — Progress Notes (Signed)
CRITICAL VALUE ALERT  Critical value received:  Troponin 0.70  Troponin in trending up. Pt asymptomatic, no complains of chest pain. MD, Hal Hope, is aware. MD gave verbal orders to continue to watch trend. Will continue to monitor pt. Carnella Guadalajara I

## 2014-07-07 NOTE — Consult Note (Addendum)
Admit date: 07/06/2014 Referring Physician  Dr. Sheran Fava Primary Physician  Dr. Chapman Fitch Primary Cardiologist  Dr. Mare Ferrari Reason for Consultation  NSTEMI  HPI: This is an 80yo female with a history of DM, HTN, MVP, metastatic breast CA, CLL and hypothyroidism who was referred to the ER after patient had a syncopal episode while visiting patient's oncologist yesterday. Patient states that over the last 2 weeks he has been getting dizzy spells when she exerts herself. She denies any chest pain or shortness of breath. At the oncologist's office patient she was about to go back when she stood up and felt dizzy and passed out. Patient did not have any palpitations or focal deficits. She has had some nausea and vomiting with epigastric discomfort. Patient's oncologist has had blood work done which showed lower hemoglobin than normal and was referred to the ER. In the ER patient's hemoglobin was found to be around 8 and patient's hemoglobin is usually around 10. Stool for occult blood was positive. Patient has been admitted for further management. Patient states she has had previous episode of GI bleed in 2010 when EGD showed bleeding blood vessel.  She was noted on admission to have an elevated troponin and also RBBB on EKG which is old compared to review of EKG's dating back to 2013.  2D echo done 09/2013 showed normal LVF with mild AS.  Cardiology is now asked to consult for further evaluation of syncope and elevated tropoinin.      PMH:   Past Medical History  Diagnosis Date  . Diabetes mellitus   . Hypertension   . Arthritis   . MVP (mitral valve prolapse)   . Hx of breast cancer     RIGHT BREAST  . Hypothyroidism      PSH:   Past Surgical History  Procedure Laterality Date  . Hemiarthroplasty hip    . Knee arthroscopy      RIGHT KNEE  . Total abdominal hysterectomy w/ bilateral salpingoophorectomy    . Pelvic and para-aortic lymph node dissection    . Tonsillectomy    . Appendectomy    .  Cholecystectomy    . US echocardiography  03/30/2009    EF 55-60%  . Cardiovascular stress test  01/28/2008    EF 74%    Allergies:  Codeine; Demerol; Librium; Lipitor; and Metoprolol Prior to Admit Meds:   Prescriptions prior to admission  Medication Sig Dispense Refill  . ACCU-CHEK FASTCLIX LANCETS MISC       . amLODipine-valsartan (EXFORGE) 10-320 MG per tablet Take 1 tablet by mouth daily.  30 tablet  6  . anastrozole (ARIMIDEX) 1 MG tablet Take 1 mg by mouth daily.      Marland Kitchen aspirin EC 81 MG tablet Take 81 mg by mouth daily.      Marland Kitchen atenolol (TENORMIN) 25 MG tablet Take 1 tablet (25 mg total) by mouth 2 (two) times daily.  60 tablet  11  . Calcium Carbonate-Vitamin D (CALCIUM + D PO) Take by mouth daily after lunch.       . cholecalciferol (VITAMIN D) 1000 UNITS tablet Take 1,000 Units by mouth daily.      . ciprofloxacin (CIPRO) 500 MG tablet Take 1 tablet (500 mg total) by mouth 2 (two) times daily.  14 tablet  0  . Cyanocobalamin (VITAMIN B-12 PO) Take by mouth daily.        . fluticasone (FLONASE) 50 MCG/ACT nasal spray Place 2 sprays into the nose daily as needed for allergies  or rhinitis.       Marland Kitchen glimepiride (AMARYL) 4 MG tablet Take 4 mg by mouth daily with lunch.      . Glucose Blood (FREESTYLE LITE TEST VI) 3 (three) times daily as needed (blood sugar monitoring.).       Marland Kitchen levothyroxine (SYNTHROID, LEVOTHROID) 75 MCG tablet Take 75 mcg by mouth daily.        . metFORMIN (GLUCOPHAGE) 500 MG tablet Take 500 mg by mouth 2 (two) times daily. With lunch and dinner.      . metroNIDAZOLE (FLAGYL) 500 MG tablet Take 500 mg by mouth 2 (two) times daily.      . pantoprazole (PROTONIX) 40 MG tablet Take 40 mg by mouth daily as needed (acid).       . potassium chloride (K-DUR) 10 MEQ tablet Take 10 mEq by mouth daily.      Marland Kitchen ZETIA 10 MG tablet Take 10 mg by mouth Daily.        Fam HX:    Family History  Problem Relation Age of Onset  . Hypertension Mother   . Heart attack Father   .  Hypertension Father   . Diabetes Sister   . Hypertension Sister   . Liver cancer Sister   . Heart attack Brother   . Hypertension Brother   . Diabetes Brother   . Diabetes Sister   . Hypertension Sister    Social HX:    History   Social History  . Marital Status: Widowed    Spouse Name: N/A    Number of Children: N/A  . Years of Education: N/A   Occupational History  . Not on file.   Social History Main Topics  . Smoking status: Never Smoker   . Smokeless tobacco: Not on file  . Alcohol Use: No  . Drug Use: No  . Sexual Activity:    Other Topics Concern  . Not on file   Social History Narrative  . No narrative on file     ROS:  All 11 ROS were addressed and are negative except what is stated in the HPI  Physical Exam: Blood pressure 166/55, pulse 84, temperature 98.7 F (37.1 C), temperature source Oral, resp. rate 20, height 5\' 5"  (1.651 m), weight 200 lb 1.6 oz (90.765 kg), SpO2 95.00%.    General: Well developed, well nourished, in no acute distress Head: Eyes PERRLA, No xanthomas.   Normal cephalic and atramatic  Lungs:   Clear bilaterally to auscultation and percussion. Heart:   HRRR S1 S2 Pulses are 2+ & equal.2/6 SM at RUSB to LLSB            No carotid bruit. No JVD.  No abdominal bruits. No femoral bruits. Abdomen: Bowel sounds are positive, abdomen soft and non-tender without masses Extremities:   No clubbing, cyanosis or edema.  DP +1 Neuro: Alert and oriented X 3. Psych:  Good affect, responds appropriately    Labs:   Lab Results  Component Value Date   WBC 26.9* 07/07/2014   HGB 8.6* 07/07/2014   HCT 27.2* 07/07/2014   MCV 74.5* 07/07/2014   PLT 479* 07/07/2014    Recent Labs Lab 07/07/14 0410  NA 133*  K 4.3  CL 97  CO2 21  BUN 10  CREATININE 0.63  CALCIUM 9.1  PROT 6.2  BILITOT 0.3  ALKPHOS 99  ALT 14  AST 16  GLUCOSE 201*   No results found for this basename: PTT   Lab Results  Component Value Date   INR 1.07 10/07/2010     INR 1.1 07/12/2009   Lab Results  Component Value Date   TROPONINI 1.50* 07/07/2014     Lab Results  Component Value Date   CHOL  Value: 135        ATP III CLASSIFICATION:  <200     mg/dL   Desirable  200-239  mg/dL   Borderline High  >=240    mg/dL   High        07/12/2009   Lab Results  Component Value Date   HDL 28* 07/12/2009   Lab Results  Component Value Date   LDLCALC  Value: 88        Total Cholesterol/HDL:CHD Risk Coronary Heart Disease Risk Table                     Men   Women  1/2 Average Risk   3.4   3.3  Average Risk       5.0   4.4  2 X Average Risk   9.6   7.1  3 X Average Risk  23.4   11.0        Use the calculated Patient Ratio above and the CHD Risk Table to determine the patient's CHD Risk.        ATP III CLASSIFICATION (LDL):  <100     mg/dL   Optimal  100-129  mg/dL   Near or Above                    Optimal  130-159  mg/dL   Borderline  160-189  mg/dL   High  >190     mg/dL   Very High 07/12/2009   Lab Results  Component Value Date   TRIG 96 07/12/2009   Lab Results  Component Value Date   CHOLHDL 4.8 07/12/2009   No results found for this basename: LDLDIRECT      Radiology:  Dg Chest 2 View  07/06/2014   CLINICAL DATA:  Dizziness.  Nausea.  Hypertension.  Diabetic.  EXAM: CHEST  2 VIEW  COMPARISON:  09/2010  FINDINGS: Apical lordotic portable view. Numerous leads and wires project over the chest. Normal heart size. No pleural effusion or pneumothorax. Low lung volumes with resultant pulmonary interstitial prominence. Clear lungs.  IMPRESSION: No acute cardiopulmonary disease.   Electronically Signed   By: Abigail Miyamoto M.D.   On: 07/06/2014 19:06    EKG: NSR with RBBB  ASSESSMENT:  1.  Elevated troponin c/w NSTEMI most likely type II with demand ischemia from anemia with possible slow GI bleed and transient hypotension. The patient is asymptomatic from a cardiac standpoint with no chest pain.   2.  Syncope that sounds orthostatic, occurring after she stood up.   This may be exacerbated by possible GI bleeding. 3.  Anemia with heme positive stools and history of UGI bleed in the past. 4.  HTN - elevated 5.  Dyslipidemia 6.  CLL 7.  Metastatic breast CA  PLAN:   1.  Continue to cycle cardiac enzymes until they peak 2.  No anticoagulation at this time due to heme positive stools with ?GI bleeding 3.  Agree with starting statin.   4.  Restart BB and titrated as needed for BP control 5.  2D echo pending.  If LVF is normal would recommend medical management at this time given patient's comorbidities including metastatic breast CA, CLL and advanced age, as well  as no history of chest pain.  I suspect this is not an acute coronary syndrome and is most likely related to demand ischemia from GI bleed.    Sueanne Margarita, MD  07/07/2014  10:20 AM

## 2014-07-07 NOTE — H&P (Signed)
[facility] History and Physical  Molly [name]:[id number] DOB: 02/25/1923 DOA: 07/06/2014  Referring physician: ER physician. PCP: Antony Blackbird, MD   Chief Complaint: Low hemoglobin and syncope.  HPI: Molly [name] is a 80 y.o. female with history of metastatic breast cancer, diabetes mellitus, hypertension, hypothyroidism, CLL was referred to the ER after patient had a syncopal episode while visiting patient's oncologist yesterday. Patient states that over the last 2 weeks he has been getting dizzy spells when she exerts. Denies any chest pain or shortness of breath. At the on call his office patient was about to go back when she stood up and felt dizzy and passed out. Patient did not have any palpitations or focal deficits. Has had some nausea and vomiting with epigastric discomfort. Patient's oncologist has had blood work done which showed lower hemoglobin than normal and was referred to the ER. In the ER patient's hemoglobin was found to be around 8 and patient's hemoglobin is usually around 10. Stool for occult blood was positive. Patient has been admitted for further management. Patient states she has had previous episode of GI bleed in 2010 when EGD showed bleeding blood vessel.  Review of Systems: As presented in the history of presenting illness, rest negative.  Past Medical History  Diagnosis Date  . Diabetes mellitus   . Hypertension   . Arthritis   . MVP (mitral valve prolapse)   . Hx of breast cancer     RIGHT BREAST  . Hypothyroidism    Past Surgical History  Procedure Laterality Date  . Hemiarthroplasty hip    . Knee arthroscopy      RIGHT KNEE  . Total abdominal hysterectomy w/ bilateral salpingoophorectomy    . Pelvic and para-aortic lymph node dissection    . Tonsillectomy    . Appendectomy    . Cholecystectomy    . US echocardiography  03/30/2009    EF 55-60%  . Cardiovascular stress test  01/28/2008    EF 74%   Social History:  reports that  she has never smoked. She does not have any smokeless tobacco history on file. She reports that she does not drink alcohol or use illicit drugs. Where does patient live home. Can patient participate in ADLs? Yes.  Allergies  Allergen Reactions  . Codeine     Makes her feel "crazy."  . Demerol     Makes her feel "crazy."  . Librium     Makes her feel "crazy."  . Lipitor [Atorvastatin Calcium]     Makes her feel "crazy."  . Metoprolol     Reaction unknown.     Family History:  Family History  Problem Relation Age of Onset  . Hypertension Mother   . Heart attack Father   . Hypertension Father   . Diabetes Sister   . Hypertension Sister   . Liver cancer Sister   . Heart attack Brother   . Hypertension Brother   . Diabetes Brother   . Diabetes Sister   . Hypertension Sister       Prior to Admission medications   Medication Sig Start Date End Date Taking? Authorizing Provider  ACCU-CHEK FASTCLIX LANCETS [place]  04/28/14  Yes Historical Provider, MD  amLODipine-valsartan (EXFORGE) 10-320 MG per tablet Take 1 tablet by mouth daily. 02/17/14  Yes Sueanne Margarita, MD  anastrozole (ARIMIDEX) 1 MG tablet Take 1 mg by mouth daily.   Yes Historical Provider, MD  aspirin EC 81 MG tablet Take 81 mg by  mouth daily.   Yes Historical Provider, MD  atenolol (TENORMIN) 25 MG tablet Take 1 tablet (25 mg total) by mouth 2 (two) times daily. 03/10/14  Yes Darlin Coco, MD  Calcium Carbonate-Vitamin D (CALCIUM + D PO) Take by mouth daily after lunch.    Yes Historical Provider, MD  cholecalciferol (VITAMIN D) 1000 UNITS tablet Take 1,000 Units by mouth daily.   Yes Historical Provider, MD  ciprofloxacin (CIPRO) 500 MG tablet Take 1 tablet (500 mg total) by mouth 2 (two) times daily. 04/27/14  Yes Noland Fordyce, PA-C  Cyanocobalamin (VITAMIN B-12 PO) Take by mouth daily.     Yes Historical Provider, MD  fluticasone (FLONASE) 50 MCG/ACT nasal spray Place 2 sprays into the nose daily as needed for  allergies or rhinitis.  11/26/11  Yes Historical Provider, MD  glimepiride (AMARYL) 4 MG tablet Take 4 mg by mouth daily with lunch.   Yes Historical Provider, MD  Glucose Blood (FREESTYLE LITE TEST VI) 3 (three) times daily as needed (blood sugar monitoring.).  08/27/13  Yes Historical Provider, MD  levothyroxine (SYNTHROID, LEVOTHROID) 75 MCG tablet Take 75 mcg by mouth daily.     Yes Historical Provider, MD  metFORMIN (GLUCOPHAGE) 500 MG tablet Take 500 mg by mouth 2 (two) times daily. With lunch and dinner.   Yes Historical Provider, MD  metroNIDAZOLE (FLAGYL) 500 MG tablet Take 500 mg by mouth 2 (two) times daily. 04/27/14  Yes Noland Fordyce, PA-C  pantoprazole (PROTONIX) 40 MG tablet Take 40 mg by mouth daily as needed (acid).    Yes Historical Provider, MD  potassium chloride (K-DUR) 10 MEQ tablet Take 10 mEq by mouth daily.   Yes Historical Provider, MD  ZETIA 10 MG tablet Take 10 mg by mouth Daily.  01/31/12  Yes Historical Provider, MD    Physical Exam: Filed Vitals:   07/06/14 2209 07/06/14 2223 07/06/14 2230 07/06/14 2245  BP: 169/67  148/64 170/56  Pulse: 95  86 86  Temp:  97.6 F (36.4 C)  97.7 F (36.5 C)  TempSrc:  Oral  Oral  Resp: 16  17 20   Height:    5\' 5"  (1.651 m)  Weight:    90.765 kg (200 lb 1.6 oz)  SpO2: 96%  95% 96%     General:  Well-developed and nourished.  Eyes: Anicteric no pallor.  ENT: No discharge from ears eyes nose mouth.  Neck: No mass felt.  Cardiovascular: S1-S2 heard.  Respiratory: No rhonchi or crepitations.  Abdomen: Soft nontender bowel sounds present. No guarding or rigidity.  Skin: No rash.  Musculoskeletal: No edema.  Psychiatric: Appears normal.  Neurologic: Alert and oriented to time place and person. Moves all extremities.  Labs on Admission:  Basic Metabolic Panel:  Recent Labs Lab 07/06/14 1747  NA 133*  K 3.8  CL 99  GLUCOSE 221*  BUN 11  CREATININE 0.60   Liver Function Tests: No results found for this  basename: AST, ALT, ALKPHOS, BILITOT, PROT, ALBUMIN,  in the last 168 hours No results found for this basename: LIPASE, AMYLASE,  in the last 168 hours No results found for this basename: AMMONIA,  in the last 168 hours CBC:  Recent Labs Lab 07/06/14 1449 07/06/14 1747 07/06/14 2030  WBC 26.0*  --  29.4*  NEUTROABS 12.4*  --  15.0*  HGB 8.8* 10.2* 9.0*  HCT 29.3* 30.0* 30.1*  MCV 75.5*  --  77.8*  PLT 469*  --  497*   Cardiac Enzymes: No  results found for this basename: CKTOTAL, CKMB, CKMBINDEX, TROPONINI,  in the last 168 hours  BNP (last 3 results) No results found for this basename: PROBNP,  in the last 8760 hours CBG:  Recent Labs Lab 07/06/14 1737  GLUCAP 233*    Radiological Exams on Admission: Dg Chest 2 View  07/06/2014   CLINICAL DATA:  Dizziness.  Nausea.  Hypertension.  Diabetic.  EXAM: CHEST  2 VIEW  COMPARISON:  09/2010  FINDINGS: Apical lordotic portable view. Numerous leads and wires project over the chest. Normal heart size. No pleural effusion or pneumothorax. Low lung volumes with resultant pulmonary interstitial prominence. Clear lungs.  IMPRESSION: No acute cardiopulmonary disease.   Electronically Signed   By: Abigail Miyamoto M.D.   On: 07/06/2014 19:06    EKG: Independently reviewed. Normal sinus rhythm with RBBB.  Assessment/Plan Principal Problem:   GI bleed Active Problems:   Breast cancer metastasized to bone   Type II or unspecified type diabetes mellitus without mention of complication, uncontrolled   Anemia, blood loss   Syncope   HTN (hypertension)   1. GI bleed - patient states that she has had GI bleed in 2010 where bleeding vessel was found in the stomach. Patient will be kept n.p.o. Protonix infusion. Closely follow CBC. Transfuse if hemoglobin less than 7. Patient is presently hemodynamically stable. Consult [facility] in a.m. 2. Anemia - probably further worsening because of GI bleed. Closely follow CBC. 3. Syncope and dizziness -  possibly secondary to #1 but closely monitor in telemetry for any arrhythmias. Check 2-D echo and cardiac markers. 4. Hypertension - patient is on when necessary IV hydralazine since patient is n.p.o. 5. Diabetes mellitus - on sliding-scale coverage for now since patient is n.p.o. 6. Hypothyroidism - IV Synthroid while n.p.o. 7. History of metastatic breast cancer - per oncologist. 8. History of CLL with leukocytosis - per oncologist.    Code Status: Full code.  Family Communication: Patient's daughter at the bedside.  Disposition Plan: Admit to inpatient.    KAKRAKANDY,ARSHAD N. [facility] Pager [phone].  If 7PM-7AM, please contact night-coverage [email or web address] Password [facility] 07/07/2014, 12:15 AM

## 2014-07-07 NOTE — Progress Notes (Signed)
INITIAL NUTRITION ASSESSMENT  DOCUMENTATION CODES Per approved criteria  -Obesity Unspecified   INTERVENTION: - Diet advancement per MD - Recommend Glucerna shakes BID once diet advanced - Discussed ways to improve appetite including small frequent meals - RD to continue to monitor   NUTRITION DIAGNOSIS: Inadequate oral intake related to inability to eat as evidenced by NPO.   Goal: Advance diet as tolerated to diabetic diet  Monitor:  Weights, labs, diet advancement  Reason for Assessment: Malnutrition screening tool   80 y.o. female  Admitting Dx: GI bleed  ASSESSMENT: Pt with history of metastatic breast cancer, diabetes mellitus, hypertension, hypothyroidism, CLL was referred to the ER after patient had a syncopal episode while visiting patient's oncologist yesterday. Patient states that over the last 2 weeks he has been getting dizzy spells. Denies any chest pain or shortness of breath. At the on call his office patient was about to go back when she stood up and felt dizzy and passed out. Patient did not have any palpitations or focal deficits. Has had some nausea and vomiting with epigastric discomfort. Patient's oncologist has had blood work done which showed lower hemoglobin than normal and was referred to the ER. In the ER patient's hemoglobin was found to be around 8 g/dL and patient's hemoglobin is usually around 10 g/dL. Stool for occult blood was positive. Had elevated troponin on admission. Pt had palpations and increased dyspnea on exertion and was found to have NSTEMI.   - Met with pt who reports her meal intake varies at home depending on her appetite - Says she's lost 6 pounds in the past 2 months - Not on any nutritional supplements at home  - Had episode of brown emesis yesterday, denies any nausea/vomiting today - Eager to see GI    Height: Ht Readings from Last 1 Encounters:  07/06/14 _0  (1.651 m)    Weight: Wt Readings from Last 1 Encounters:   07/06/14 200 lb 1.6 oz (90.765 kg)    Ideal Body Weight: 125 lbs   % Ideal Body Weight: 160%  Wt Readings from Last 10 Encounters:  07/06/14 200 lb 1.6 oz (90.765 kg)  07/06/14 200 lb 1.6 oz (90.765 kg)  04/27/14 206 lb (93.441 kg)  04/13/14 206 lb 14.4 oz (93.849 kg)  03/10/14 207 lb (93.895 kg)  11/06/13 209 lb 8 oz (95.029 kg)  10/22/13 209 lb (94.802 kg)  09/21/13 210 lb 12.8 oz (95.618 kg)  08/28/13 212 lb 12.8 oz (96.525 kg)  03/20/13 207 lb 9.6 oz (94.167 kg)    Usual Body Weight: 206 lbs per pt  % Usual Body Weight: 97%  BMI:  Body mass index is 33.3 kg/(m^2). Class I obesity   Estimated Nutritional Needs: Kcal: 1550-1750 Protein: 70-90g Fluid: per MD  Skin: +1 RLE, LLE edema  Diet Order: NPO  EDUCATION NEEDS: -No education needs identified at this time   Intake/Output Summary (Last 24 hours) at 07/07/14 1307 Last data filed at 07/07/14 0609  Gross per 24 hour  Intake 511.67 ml  Output    725 ml  Net -213.33 ml    Last BM: PTA  Labs:   Recent Labs Lab 07/06/14 1747 07/07/14 0410  NA 133* 133*  K 3.8 4.3  CL 99 97  CO2  --  21  BUN 11 10  CREATININE 0.60 0.63  CALCIUM  --  9.1  GLUCOSE 221* 201*    CBG (last 3)   Recent Labs  07/07/14 0644 07/07/14 0823 07/07/14  1202  GLUCAP 188* 183* 158*    Scheduled Meds: . amLODipine  10 mg Oral Daily  . [START ON 07/08/2014] aspirin EC  81 mg Oral Daily  . atenolol  25 mg Oral BID  . insulin aspart  0-9 Units Subcutaneous TID WC  . irbesartan  300 mg Oral Daily  . levothyroxine  75 mcg Oral QAC breakfast  . [START ON 07/10/2014] pantoprazole (PROTONIX) IV  40 mg Intravenous Q12H  . rosuvastatin  40 mg Oral q1800  . sodium chloride  3 mL Intravenous Q12H    Continuous Infusions: . sodium chloride 75 mL/hr at 07/07/14 0053  . heparin 900 Units/hr (07/07/14 1252)  . pantoprozole (PROTONIX) infusion 8 mg/hr (07/07/14 0053)    Past Medical History  Diagnosis Date  . Diabetes  mellitus   . Hypertension   . Arthritis   . MVP (mitral valve prolapse)   . Hx of breast cancer     RIGHT BREAST  . Hypothyroidism     Past Surgical History  Procedure Laterality Date  . Hemiarthroplasty hip    . Knee arthroscopy      RIGHT KNEE  . Total abdominal hysterectomy w/ bilateral salpingoophorectomy    . Pelvic and para-aortic lymph node dissection    . Tonsillectomy    . Appendectomy    . Cholecystectomy    . US echocardiography  03/30/2009    EF 55-60%  . Cardiovascular stress test  01/28/2008    EF 74%    Carlis Stable MS, RD, LDN [phone] Pager [phone] Weekend/After Hours Pager

## 2014-07-07 NOTE — Progress Notes (Signed)
Pt transported from ED via stretcher and moved self over to bed with little assistance. Daughter, [name], at bedside. BP 170/56, otherwise VSS. No distress noted at this time. Will continue to monitor pt closely. Carnella [place] I

## 2014-07-07 NOTE — Care Management Note (Signed)
CARE MANAGEMENT NOTE 07/07/2014  Patient:  Molly [name], Molly [name]   Account Number:  [id number]  Date Initiated:  07/07/2014  Documentation initiated by:  Leafy Kindle  Subjective/Objective Assessment:   80 yo female admitted with syncope episode and GI bleed. Hx of Diabetes mellitus  .  Hypertension  .  Arthritis  .  MVP (mitral valve prolapse)  .  Hx of breast cancer     Action/Plan:   From home.  Daughter assists pt at home, but does not live with her.   Anticipated DC Date:  07/10/2014   Anticipated DC Plan:  [place]  CM consult      Choice offered to / List presented to:             Status of service:  In process, will continue to follow Medicare Important Message given?   (If response is "NO", the following Medicare IM given date fields will be blank) Date Medicare IM given:   Medicare IM given by:   Date Additional Medicare IM given:   Additional Medicare IM given by:    Discharge Disposition:    Per UR Regulation:  Reviewed for med. necessity/level of care/duration of stay  If discussed at [place] of Stay Meetings, dates discussed:    Comments:  07/07/14 Marney Doctor RN,BSN,NCM Pt receiving IV fluids, and awaiting GI consult. Cardiology is following as well.  Pt states that she has wheelchair, walker, cane and bsc at home already.  CM will await PT consult and assist with DC needs as needed.

## 2014-07-07 NOTE — Progress Notes (Signed)
*  PRELIMINARY RESULTS* Echocardiogram 2D Echocardiogram has been performed.  Leavy Cella 07/07/2014, 11:02 AM

## 2014-07-07 NOTE — Telephone Encounter (Signed)
Faxed pt medical records to Dr. Amedeo Plenty [phone]

## 2014-07-07 NOTE — Progress Notes (Signed)
ANTICOAGULATION CONSULT NOTE - Initial Consult  Pharmacy Consult for Heparin infusion Indication: chest pain/ACS  Allergies  Allergen Reactions  . Codeine     Makes her feel "crazy."  . Demerol     Makes her feel "crazy."  . Librium     Makes her feel "crazy."  . Lipitor [Atorvastatin Calcium]     Makes her feel "crazy."  . Metoprolol     Reaction unknown.     Patient Measurements: Height: 5\' 5"  (165.1 cm) Weight: 200 lb 1.6 oz (90.765 kg) IBW/kg (Calculated) : 57 Heparin Dosing Weight: 77 kg  Vital Signs: Temp: 98.7 F (37.1 C) (08/26 0211) Temp src: Oral (08/26 0211) BP: 166/55 mmHg (08/26 0944) Pulse Rate: 84 (08/26 0944)  Labs:  Recent Labs  07/06/14 1747 07/06/14 2030 07/07/14 0047 07/07/14 0230 07/07/14 0410 07/07/14 0830  HGB 10.2* 9.0* 8.6*  --  8.6*  --   HCT 30.0* 30.1* 28.2*  --  27.2*  --   PLT  --  497* 460*  --  479*  --   CREATININE 0.60  --   --   --  0.63  --   TROPONINI  --   --  0.42* 0.70*  --  1.50*    Estimated Creatinine Clearance: 54.1 ml/min (by C-G formula based on Cr of 0.63).   Medical History: Past Medical History  Diagnosis Date  . Diabetes mellitus   . Hypertension   . Arthritis   . MVP (mitral valve prolapse)   . Hx of breast cancer     RIGHT BREAST  . Hypothyroidism     Assessment: 49 yoF with h/o metastatic breast cancer, DM, HTN, hypothyroidism, CLL. Pharmacy consulted to dose heparin infusion for ACS/ chest pain. Note possible GI bleed, Hgb stable, recent BM not bloody.  CBC: Hbg low but stable.   Goal of Therapy:  Heparin level 0.3-0.7 units/ml Monitor platelets by anticoagulation protocol: Yes   Plan:  Give Heparin 4000 unit bolus now Then start Heparin infusion @ 900 units/hr F/u baseline PTT, INR Obtain Heparin level 8 hours after start of infusion Daily Heparin level, CBC  Kizzie Furnish, PharmD Pager: [phone] 07/07/2014 10:03 AM

## 2014-07-07 NOTE — Consult Note (Signed)
[facility] CONSULT Reason for consult: anemia and G.I. bleeding Referring Physician: [facility]. PCP: Dr. Chapman Fitch.  Molly [name] is an 80 y.o. female.  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 Amedeo Plenty. She has diabetes and hypertension as well as CLL and metastatic breast cancer. She had a syncopal episodes of visiting the oncologist yesterday and her hemoglobin and drop. Normally it's around 10 drop to 8. Her stools were positive. Patient states that her stools are occasionally dark that she is seen no bright red blood. She has never had colonoscopy and states to me that she will not have colonoscopy because she does not feel that she is to drinking all of the prep for colonoscopy. Apparently colonoscopy has been suggested several times and she has always refused. She adamantly denies using any NSAIDs and has had no abdominal pain. She is in aspirin 81 mg daily.  Past Medical History  Diagnosis Date  . Diabetes mellitus   . Hypertension   . Arthritis   . MVP (mitral valve prolapse)   . Hx of breast cancer     RIGHT BREAST  . Hypothyroidism     Past Surgical History  Procedure Laterality Date  . Hemiarthroplasty hip    . Knee arthroscopy      RIGHT KNEE  . Total abdominal hysterectomy w/ bilateral salpingoophorectomy    . Pelvic and para-aortic lymph node dissection    . Tonsillectomy    . Appendectomy    . Cholecystectomy    . US echocardiography  03/30/2009    EF 55-60%  . Cardiovascular stress test  01/28/2008    EF 74%    Family History  Problem Relation Age of Onset  . Hypertension Mother   . Heart attack Father   . Hypertension Father   . Diabetes Sister   . Hypertension Sister   . Liver cancer Sister   . Heart attack Brother   . Hypertension Brother   . Diabetes Brother   . Diabetes Sister   . Hypertension Sister     Social History:  reports that she has never smoked. She  does not have any smokeless tobacco history on file. She reports that she does not drink alcohol or use illicit drugs.  Allergies:  Allergies  Allergen Reactions  . Codeine     Makes her feel "crazy."  . Demerol     Makes her feel "crazy."  . Librium     Makes her feel "crazy."  . Lipitor [Atorvastatin Calcium]     Makes her feel "crazy."  . Metoprolol     Reaction unknown.     Medications; Prior to Admission medications   Medication Sig Start Date End Date Taking? Authorizing Provider  ACCU-CHEK FASTCLIX LANCETS [place]  04/28/14  Yes Historical Provider, MD  amLODipine-valsartan (EXFORGE) 10-320 MG per tablet Take 1 tablet by mouth daily. 02/17/14  Yes Sueanne Margarita, MD  anastrozole (ARIMIDEX) 1 MG tablet Take 1 mg by mouth daily.   Yes Historical Provider, MD  aspirin EC 81 MG tablet Take 81 mg by mouth daily.   Yes Historical Provider, MD  atenolol (TENORMIN) 25 MG tablet Take 1 tablet (25 mg total) by mouth 2 (two) times daily. 03/10/14  Yes Darlin Coco, MD  Calcium Carbonate-Vitamin D (CALCIUM + D PO) Take by mouth daily after lunch.    Yes Historical Provider, MD  cholecalciferol (VITAMIN D) 1000 UNITS tablet  Take 1,000 Units by mouth daily.   Yes Historical Provider, MD  ciprofloxacin (CIPRO) 500 MG tablet Take 1 tablet (500 mg total) by mouth 2 (two) times daily. 04/27/14  Yes Noland Fordyce, PA-C  Cyanocobalamin (VITAMIN B-12 PO) Take by mouth daily.     Yes Historical Provider, MD  fluticasone (FLONASE) 50 MCG/ACT nasal spray Place 2 sprays into the nose daily as needed for allergies or rhinitis.  11/26/11  Yes Historical Provider, MD  glimepiride (AMARYL) 4 MG tablet Take 4 mg by mouth daily with lunch.   Yes Historical Provider, MD  Glucose Blood (FREESTYLE LITE TEST VI) 3 (three) times daily as needed (blood sugar monitoring.).  08/27/13  Yes Historical Provider, MD  levothyroxine (SYNTHROID, LEVOTHROID) 75 MCG tablet Take 75 mcg by mouth daily.     Yes Historical Provider,  MD  metFORMIN (GLUCOPHAGE) 500 MG tablet Take 500 mg by mouth 2 (two) times daily. With lunch and dinner.   Yes Historical Provider, MD  metroNIDAZOLE (FLAGYL) 500 MG tablet Take 500 mg by mouth 2 (two) times daily. 04/27/14  Yes Noland Fordyce, PA-C  pantoprazole (PROTONIX) 40 MG tablet Take 40 mg by mouth daily as needed (acid).    Yes Historical Provider, MD  potassium chloride (K-DUR) 10 MEQ tablet Take 10 mEq by mouth daily.   Yes Historical Provider, MD  ZETIA 10 MG tablet Take 10 mg by mouth Daily.  01/31/12  Yes Historical Provider, MD   . amLODipine  10 mg Oral Daily  . [START ON 07/08/2014] aspirin EC  81 mg Oral Daily  . atenolol  25 mg Oral BID  . insulin aspart  0-9 Units Subcutaneous TID WC  . irbesartan  300 mg Oral Daily  . levothyroxine  75 mcg Oral QAC breakfast  . [START ON 07/10/2014] pantoprazole (PROTONIX) IV  40 mg Intravenous Q12H  . rosuvastatin  40 mg Oral q1800  . sodium chloride  3 mL Intravenous Q12H   PRN Meds acetaminophen, acetaminophen, hydrALAZINE, nitroGLYCERIN, ondansetron (ZOFRAN) IV, ondansetron Results for orders placed during the hospital encounter of 07/06/14 (from the past 48 hour(s))  CBG MONITORING, ED     Status: Abnormal   Collection Time    07/06/14  5:37 PM      Result Value Ref Range   Glucose-Capillary 233 (*) 70 - 99 mg/dL  I-STAT CHEM 8, ED     Status: Abnormal   Collection Time    07/06/14  5:47 PM      Result Value Ref Range   Sodium 133 (*) 137 - 147 mEq/L   Potassium 3.8  3.7 - 5.3 mEq/L   Chloride 99  96 - 112 mEq/L   BUN 11  6 - 23 mg/dL   Creatinine, Ser 0.60  0.50 - 1.10 mg/dL   Glucose, Bld 221 (*) 70 - 99 mg/dL   Calcium, Ion 1.19  1.13 - 1.30 mmol/L   TCO2 25  0 - 100 mmol/L   Hemoglobin 10.2 (*) 12.0 - 15.0 g/dL   HCT 30.0 (*) 36.0 - 46.0 %  I-STAT TROPOININ, ED     Status: None   Collection Time    07/06/14  6:25 PM      Result Value Ref Range   Troponin i, poc 0.00  0.00 - 0.08 ng/mL   Comment 3             Comment: Due to the release kinetics of cTnI,     a negative result within the  first hours     of the onset of symptoms does not rule out     myocardial infarction with certainty.     If myocardial infarction is still suspected,     repeat the test at appropriate intervals.  CBC WITH DIFFERENTIAL     Status: Abnormal   Collection Time    07/06/14  8:30 PM      Result Value Ref Range   WBC 29.4 (*) 4.0 - 10.5 K/uL   RBC 3.87  3.87 - 5.11 MIL/uL   Hemoglobin 9.0 (*) 12.0 - 15.0 g/dL   HCT 30.1 (*) 36.0 - 46.0 %   MCV 77.8 (*) 78.0 - 100.0 fL   MCH 23.3 (*) 26.0 - 34.0 pg   MCHC 29.9 (*) 30.0 - 36.0 g/dL   RDW 14.8  11.5 - 15.5 %   Platelets 497 (*) 150 - 400 K/uL   Neutrophils Relative % 51  43 - 77 %   Lymphocytes Relative 44  12 - 46 %   Monocytes Relative 4  3 - 12 %   Eosinophils Relative 1  0 - 5 %   Basophils Relative 0  0 - 1 %   Neutro Abs 15.0 (*) 1.7 - 7.7 K/uL   Lymphs Abs 12.9 (*) 0.7 - 4.0 K/uL   Monocytes Absolute 1.2 (*) 0.1 - 1.0 K/uL   Eosinophils Absolute 0.3  0.0 - 0.7 K/uL   Basophils Absolute 0.0  0.0 - 0.1 K/uL   RBC Morphology POLYCHROMASIA PRESENT     WBC Morphology ATYPICAL LYMPHOCYTES    POC OCCULT BLOOD, ED     Status: Abnormal   Collection Time    07/06/14  8:38 PM      Result Value Ref Range   Fecal Occult Bld POSITIVE (*) NEGATIVE  I-STAT TROPOININ, ED     Status: None   Collection Time    07/06/14  9:35 PM      Result Value Ref Range   Troponin i, poc 0.00  0.00 - 0.08 ng/mL   Comment 3            Comment: Due to the release kinetics of cTnI,     a negative result within the first hours     of the onset of symptoms does not rule out     myocardial infarction with certainty.     If myocardial infarction is still suspected,     repeat the test at appropriate intervals.  CBC     Status: Abnormal   Collection Time    07/07/14 12:47 AM      Result Value Ref Range   WBC 27.0 (*) 4.0 - 10.5 K/uL   RBC 3.71 (*) 3.87 - 5.11 MIL/uL   Hemoglobin  8.6 (*) 12.0 - 15.0 g/dL   HCT 28.2 (*) 36.0 - 46.0 %   MCV 76.0 (*) 78.0 - 100.0 fL   MCH 23.2 (*) 26.0 - 34.0 pg   MCHC 30.5  30.0 - 36.0 g/dL   RDW 14.5  11.5 - 15.5 %   Platelets 460 (*) 150 - 400 K/uL  TROPONIN I     Status: Abnormal   Collection Time    07/07/14 12:47 AM      Result Value Ref Range   Troponin I 0.42 (*) <0.30 ng/mL   Comment:            Due to the release kinetics of cTnI,     a negative result within  the first hours     of the onset of symptoms does not rule out     myocardial infarction with certainty.     If myocardial infarction is still suspected,     repeat the test at appropriate intervals.     CRITICAL RESULT CALLED TO, READ BACK BY AND VERIFIED WITH:     J.ASARO RN AT 0142 ON [id number] BY C.BONGEL  TYPE AND SCREEN     Status: None   Collection Time    07/07/14 12:50 AM      Result Value Ref Range   ABO/RH(D) A POS     Antibody Screen NEG     Sample Expiration 07/10/2014    TROPONIN I     Status: Abnormal   Collection Time    07/07/14  2:30 AM      Result Value Ref Range   Troponin I 0.70 (*) <0.30 ng/mL   Comment:            Due to the release kinetics of cTnI,     a negative result within the first hours     of the onset of symptoms does not rule out     myocardial infarction with certainty.     If myocardial infarction is still suspected,     repeat the test at appropriate intervals.     CRITICAL VALUE NOTED.  VALUE IS CONSISTENT WITH PREVIOUSLY REPORTED AND CALLED VALUE.  LIPASE, BLOOD     Status: None   Collection Time    07/07/14  2:30 AM      Result Value Ref Range   Lipase 23  11 - 59 U/L  CBC     Status: Abnormal   Collection Time    07/07/14  4:10 AM      Result Value Ref Range   WBC 26.9 (*) 4.0 - 10.5 K/uL   RBC 3.65 (*) 3.87 - 5.11 MIL/uL   Hemoglobin 8.6 (*) 12.0 - 15.0 g/dL   HCT 27.2 (*) 36.0 - 46.0 %   MCV 74.5 (*) 78.0 - 100.0 fL   MCH 23.6 (*) 26.0 - 34.0 pg   MCHC 31.6  30.0 - 36.0 g/dL   RDW 14.6  11.5 - 15.5 %    Platelets 479 (*) 150 - 400 K/uL  COMPREHENSIVE METABOLIC PANEL     Status: Abnormal   Collection Time    07/07/14  4:10 AM      Result Value Ref Range   Sodium 133 (*) 137 - 147 mEq/L   Potassium 4.3  3.7 - 5.3 mEq/L   Chloride 97  96 - 112 mEq/L   CO2 21  19 - 32 mEq/L   Glucose, Bld 201 (*) 70 - 99 mg/dL   BUN 10  6 - 23 mg/dL   Creatinine, Ser 0.63  0.50 - 1.10 mg/dL   Calcium 9.1  8.4 - 10.5 mg/dL   Total Protein 6.2  6.0 - 8.3 g/dL   Albumin 3.1 (*) 3.5 - 5.2 g/dL   AST 16  0 - 37 U/L   ALT 14  0 - 35 U/L   Alkaline Phosphatase 99  39 - 117 U/L   Total Bilirubin 0.3  0.3 - 1.2 mg/dL   GFR calc non Af Amer 78 (*) >90 mL/min   GFR calc Af Amer >90  >90 mL/min   Comment: (NOTE)     The eGFR has been calculated using the CKD EPI equation.     This  calculation has not been validated in all clinical situations.     eGFR's persistently <90 mL/min signify possible Chronic Kidney     Disease.   Anion gap 15  5 - 15  GLUCOSE, CAPILLARY     Status: Abnormal   Collection Time    07/07/14  4:56 AM      Result Value Ref Range   Glucose-Capillary 189 (*) 70 - 99 mg/dL  GLUCOSE, CAPILLARY     Status: Abnormal   Collection Time    07/07/14  6:44 AM      Result Value Ref Range   Glucose-Capillary 188 (*) 70 - 99 mg/dL  GLUCOSE, CAPILLARY     Status: Abnormal   Collection Time    07/07/14  8:23 AM      Result Value Ref Range   Glucose-Capillary 183 (*) 70 - 99 mg/dL  TROPONIN I     Status: Abnormal   Collection Time    07/07/14  8:30 AM      Result Value Ref Range   Troponin I 1.50 (*) <0.30 ng/mL   Comment:            Due to the release kinetics of cTnI,     a negative result within the first hours     of the onset of symptoms does not rule out     myocardial infarction with certainty.     If myocardial infarction is still suspected,     repeat the test at appropriate intervals.     CRITICAL VALUE NOTED.  VALUE IS CONSISTENT WITH PREVIOUSLY REPORTED AND CALLED VALUE.   GLUCOSE, CAPILLARY     Status: Abnormal   Collection Time    07/07/14 12:02 PM      Result Value Ref Range   Glucose-Capillary 158 (*) 70 - 99 mg/dL   Comment 1 Notify RN     Comment 2 Documented in Chart      Dg Chest 2 View  07/06/2014   CLINICAL DATA:  Dizziness.  Nausea.  Hypertension.  Diabetic.  EXAM: CHEST  2 VIEW  COMPARISON:  09/2010  FINDINGS: Apical lordotic portable view. Numerous leads and wires project over the chest. Normal heart size. No pleural effusion or pneumothorax. Low lung volumes with resultant pulmonary interstitial prominence. Clear lungs.  IMPRESSION: No acute cardiopulmonary disease.   Electronically Signed   By: Abigail Miyamoto M.D.   On: 07/06/2014 19:06               Blood pressure 166/55, pulse 84, temperature 98.7 F (37.1 C), temperature source Oral, resp. rate 20, height 5' 5"  (1.651 m), weight 90.765 kg (200 lb 1.6 oz), SpO2 95.00%.  Physical exam:   General-- talkative alert white female in no acute distress Heart-- regular rate and rhythm without murmurs are gallops Lungs--clear Abdomen-- soft and nontender   Assessment: 1. G.I. bleeding. This is low-grade but is enough to cause her to be anemic and probably contributing to her cardiac issues. The issue is whether or not she can be anticoagulated if needed. I think she could stand an EGD. She refuses to consider colonoscopy at this time. 2. Questionable NSTEMI with slight elevation of cardiac enzymes. Patient has been seen by cardiology and is not felt that she needs anticoagulation at this time. 3. History of breast cancer metastatic bone with several pathological fractures 4. Diabetes 5. Hypertension 6. CLL. 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 7. Chronic UTIs/bladder spasms  Plan: 1. We will  plan EGD tomorrow to rule out gross lesion of the upper G.I. track contributing to her bleeding. There is an APM will try to cauterize it etc. Would keep her on PPI therapy  for now. 2. Patient states that she will not have colonoscopy.   Keitra Carusone JR,Candid Bovey L 07/07/2014, 2:02 PM

## 2014-07-07 NOTE — Progress Notes (Addendum)
[facility] PROGRESS NOTE  Molly [name] TXM:[id number] DOB: [birth year] DOA: 07/06/2014 PCP: Antony Blackbird, MD  Assessment/Plan  NSTEMI - chest pain free, but has been having palpitations and increased DOE.  Could be due to recent arrhythmia, however, tele with NSR and occasional PVC only.  ECG with RBBB and possibly slightly deeper ST segment depressions in V4-6 -  Continue to trend troponins -  Telemetry -  Resume BB -  Start crestor (intolerant to atorvastatin) -  hgb stable despite positive occult and last BM brown, consider starting heparin gtt  -  ASA 325mg  now -  Cardiology consultation -  A1c and lipid panel  GI bleed, last stool brown, hgb approximately stable.  May have a slow bleeding AVM.  Patient very worried about this  -  Will notify GI -  Transfuse for hgb < 7 -  Continue PPI  Anemia of chronic ds, malignancy, and GIB -  Transfuse for hgb < 7   -  Repeat hgb this afternoon  Syncope and dizziness - possibly due to arrhythmia or NSTEMI -  F/u ECHO -  As above -  Continue IVF  Hypertension - hypertensive -  Resume oral meds with sips, particularly BB  Diabetes mellitus with peripheral neuropathy -  A1c -  Continue SSI  Hypothyroidism -  TSH -  Resume oral synthroid  History of metastatic breast cancer - per oncologist Dr. Benay Spice  History of CLL with chronic stable leukocytosis - per oncologist Dr. Benay Spice  Mild hyponatremia, chronic, likely reset osmostat -  Trend sodium  DIET:  NPO  Access:  PIV IVF:  yes Proph:  SCD  Code Status: Full code.  Family Communication: Patient's daughter at the bedside.  Disposition Plan: Inpatient.   Consultants:  [facility], followed by Dr. Mare Ferrari  Procedures:  ECHO pending  Antibiotics:  none   HPI/Subjective:  DOE and palpitations with exertion for the last two weeks.  Denies PND, orthopnea, increased LEE, chest tightness or pressure.    Objective: Filed Vitals:    07/06/14 2223 07/06/14 2230 07/06/14 2245 07/07/14 0211  BP:  148/64 170/56 154/52  Pulse:  86 86 84  Temp: 97.6 F (36.4 C)  97.7 F (36.5 C) 98.7 F (37.1 C)  TempSrc: Oral  Oral Oral  Resp:  17 20 20   Height:   5\' 5"  (1.651 m)   Weight:   90.765 kg (200 lb 1.6 oz)   SpO2:  95% 96% 95%    Intake/Output Summary (Last 24 hours) at 07/07/14 0845 Last data filed at 07/07/14 0609  Gross per 24 hour  Intake 511.67 ml  Output    725 ml  Net -213.33 ml   Filed Weights   07/06/14 2245  Weight: 90.765 kg (200 lb 1.6 oz)    Exam:   General:  WF, No acute distress  HEENT:  NCAT, MMM  Cardiovascular:  RRR, nl S1, S2 no mrg, 2+ pulses, warm extremities  Respiratory:  CTAB, no increased WOB  Abdomen:   NABS, soft, NT/ND  MSK:   Normal tone and bulk, no LEE  Neuro:  Grossly intact  Data Reviewed: Basic Metabolic Panel:  Recent Labs Lab 07/06/14 1747 07/07/14 0410  NA 133* 133*  K 3.8 4.3  CL 99 97  CO2  --  21  GLUCOSE 221* 201*  BUN 11 10  CREATININE 0.60 0.63  CALCIUM  --  9.1   Liver Function Tests:  Recent Labs Lab 07/07/14 0410  AST 16  ALT 14  ALKPHOS 99  BILITOT 0.3  PROT 6.2  ALBUMIN 3.1*    Recent Labs Lab 07/07/14 0230  LIPASE 23   No results found for this basename: AMMONIA,  in the last 168 hours CBC:  Recent Labs Lab 07/06/14 1449 07/06/14 1747 07/06/14 2030 07/07/14 0047 07/07/14 0410  WBC 26.0*  --  29.4* 27.0* 26.9*  NEUTROABS 12.4*  --  15.0*  --   --   HGB 8.8* 10.2* 9.0* 8.6* 8.6*  HCT 29.3* 30.0* 30.1* 28.2* 27.2*  MCV 75.5*  --  77.8* 76.0* 74.5*  PLT 469*  --  497* 460* 479*   Cardiac Enzymes:  Recent Labs Lab 07/07/14 0047 07/07/14 0230  TROPONINI 0.42* 0.70*   BNP (last 3 results) No results found for this basename: PROBNP,  in the last 8760 hours CBG:  Recent Labs Lab 07/06/14 1737 07/07/14 0456 07/07/14 0644 07/07/14 0823  GLUCAP 233* 189* 188* 183*    No results found for this or any  previous visit (from the past 240 hour(s)).   Studies: Dg Chest 2 View  07/06/2014   CLINICAL DATA:  Dizziness.  Nausea.  Hypertension.  Diabetic.  EXAM: CHEST  2 VIEW  COMPARISON:  09/2010  FINDINGS: Apical lordotic portable view. Numerous leads and wires project over the chest. Normal heart size. No pleural effusion or pneumothorax. Low lung volumes with resultant pulmonary interstitial prominence. Clear lungs.  IMPRESSION: No acute cardiopulmonary disease.   Electronically Signed   By: Abigail Miyamoto M.D.   On: 07/06/2014 19:06    Scheduled Meds: . atenolol  25 mg Oral BID  . insulin aspart  0-9 Units Subcutaneous TID WC  . levothyroxine  37.5 mcg Intravenous Daily  . [START ON 07/10/2014] pantoprazole (PROTONIX) IV  40 mg Intravenous Q12H  . sodium chloride  3 mL Intravenous Q12H   Continuous Infusions: . sodium chloride 75 mL/hr at 07/07/14 0053  . pantoprozole (PROTONIX) infusion 8 mg/hr (07/07/14 0053)    Principal Problem:   GI bleed Active Problems:   Breast cancer metastasized to bone   Type II or unspecified type diabetes mellitus without mention of complication, uncontrolled   Anemia, blood loss   Syncope   HTN (hypertension)    Time spent: 30 min    [name], [facility] Pager [phone]. If 7PM-7AM, please contact night-coverage at [email or web address], password [facility] 07/07/2014, 8:45 AM  LOS: 1 day

## 2014-07-08 ENCOUNTER — Encounter (HOSPITAL_COMMUNITY): Payer: Self-pay

## 2014-07-08 ENCOUNTER — Encounter (HOSPITAL_COMMUNITY)
Admission: EM | Disposition: A | Discharge status: Home / Self Care | Payer: Self-pay | Source: Home / Self Care | Attending: Internal Medicine

## 2014-07-08 DIAGNOSIS — D500 Iron deficiency anemia secondary to blood loss (chronic): Secondary | ICD-10-CM

## 2014-07-08 DIAGNOSIS — E7800 Pure hypercholesterolemia, unspecified: Secondary | ICD-10-CM

## 2014-07-08 HISTORY — PX: ESOPHAGOGASTRODUODENOSCOPY: SHX5428

## 2014-07-08 LAB — GLUCOSE, CAPILLARY
GLUCOSE-CAPILLARY: 151 mg/dL — AB (ref 70–99)
GLUCOSE-CAPILLARY: 169 mg/dL — AB (ref 70–99)
GLUCOSE-CAPILLARY: 132 mg/dL — AB (ref 70–99)
GLUCOSE-CAPILLARY: 158 mg/dL — AB (ref 70–99)
Glucose-Capillary: 158 mg/dL — ABNORMAL HIGH (ref 70–99)
Glucose-Capillary: 187 mg/dL — ABNORMAL HIGH (ref 70–99)
Glucose-Capillary: 144 mg/dL — ABNORMAL HIGH (ref 70–99)
Glucose-Capillary: 243 mg/dL — ABNORMAL HIGH (ref 70–99)

## 2014-07-08 LAB — CBC
HCT: 27.1 % — ABNORMAL LOW (ref 36.0–46.0)
Hemoglobin: 8.1 g/dL — ABNORMAL LOW (ref 12.0–15.0)
MCH: 23.1 pg — AB (ref 26.0–34.0)
MCHC: 29.9 g/dL — AB (ref 30.0–36.0)
MCV: 77.4 fL — ABNORMAL LOW (ref 78.0–100.0)
PLATELETS: 425 10*3/uL — AB (ref 150–400)
RBC: 3.5 MIL/uL — ABNORMAL LOW (ref 3.87–5.11)
RDW: 14.8 % (ref 11.5–15.5)
WBC: 24.7 10*3/uL — ABNORMAL HIGH (ref 4.0–10.5)

## 2014-07-08 LAB — BASIC METABOLIC PANEL
ANION GAP: 12 (ref 5–15)
BUN: 9 mg/dL (ref 6–23)
CO2: 20 mEq/L (ref 19–32)
CREATININE: 0.76 mg/dL (ref 0.50–1.10)
Calcium: 8.7 mg/dL (ref 8.4–10.5)
Chloride: 100 mEq/L (ref 96–112)
GFR, EST AFRICAN AMERICAN: 85 mL/min — AB (ref >90)
GFR, EST NON AFRICAN AMERICAN: 73 mL/min — AB (ref >90)
Glucose, Bld: 169 mg/dL — ABNORMAL HIGH (ref 70–99)
POTASSIUM: 4 meq/L (ref 3.7–5.3)
Sodium: 132 mEq/L — ABNORMAL LOW (ref 137–147)

## 2014-07-08 SURGERY — EGD (ESOPHAGOGASTRODUODENOSCOPY)
Anesthesia: Moderate Sedation

## 2014-07-08 MED ORDER — MIDAZOLAM HCL 10 MG/2ML IJ SOLN
INTRAMUSCULAR | Status: DC | PRN
  Administered 2014-07-08: 1 mg via INTRAVENOUS
  Administered 2014-07-08: 2 mg via INTRAVENOUS

## 2014-07-08 MED ORDER — MIDAZOLAM HCL 10 MG/2ML IJ SOLN
INTRAMUSCULAR | Status: AC
  Filled 2014-07-08: qty 4

## 2014-07-08 MED ORDER — ONDANSETRON HCL 4 MG/2ML IJ SOLN
INTRAMUSCULAR | Status: AC
  Filled 2014-07-08: qty 2

## 2014-07-08 MED ORDER — FENTANYL CITRATE 0.05 MG/ML IJ SOLN
INTRAMUSCULAR | Status: DC | PRN
  Administered 2014-07-08: 25 ug via INTRAVENOUS

## 2014-07-08 MED ORDER — ONDANSETRON HCL 4 MG/2ML IJ SOLN
INTRAMUSCULAR | Status: DC | PRN
  Administered 2014-07-08: 4 mg via INTRAVENOUS

## 2014-07-08 MED ORDER — BUTAMBEN-TETRACAINE-BENZOCAINE 2-2-14 % EX AERO
INHALATION_SPRAY | CUTANEOUS | Status: DC | PRN
  Administered 2014-07-08: 2 via TOPICAL

## 2014-07-08 MED ORDER — ISOSORB DINITRATE-HYDRALAZINE 20-37.5 MG PO TABS
1.0000 | ORAL_TABLET | Freq: Three times a day (TID) | ORAL | Status: DC
  Administered 2014-07-08 – 2014-07-11 (×8): 1 via ORAL
  Filled 2014-07-08 (×11): qty 1

## 2014-07-08 MED ORDER — DIPHENHYDRAMINE HCL 25 MG PO CAPS: 25.0000 mg | ORAL_CAPSULE | Freq: Every evening | ORAL | Status: DC | PRN

## 2014-07-08 MED ORDER — FENTANYL CITRATE 0.05 MG/ML IJ SOLN
INTRAMUSCULAR | Status: AC
  Filled 2014-07-08: qty 2

## 2014-07-08 MED ORDER — DIPHENHYDRAMINE HCL 12.5 MG/5ML PO ELIX: 12.5000 mg | ORAL_SOLUTION | Freq: Every evening | ORAL | Status: DC | PRN

## 2014-07-08 MED ORDER — ATENOLOL 50 MG PO TABS
50.0000 mg | ORAL_TABLET | Freq: Two times a day (BID) | ORAL | Status: DC
  Administered 2014-07-08 – 2014-07-11 (×7): 50 mg via ORAL
  Filled 2014-07-08 (×12): qty 1

## 2014-07-08 NOTE — Progress Notes (Addendum)
SUBJECTIVE: No chest pain or SOB.   BP 183/62  Pulse 70  Temp(Src) 98.6 F (37 C) (Oral)  Resp 20  Ht 5\' 5"  (1.651 m)  Wt 200 lb 1.6 oz (90.765 kg)  BMI 33.30 kg/m2  SpO2 98% No intake or output data in the 24 hours ending 07/08/14 0656  PHYSICAL EXAM General: Well developed, well nourished, in no acute distress. Alert and oriented x 3.  Psych:  Good affect, responds appropriately Neck: No JVD. No masses noted.  Lungs: Clear bilaterally with no wheezes or rhonci noted.  Heart: RRR with systolic murmur noted. Abdomen: Bowel sounds are present. Soft, non-tender.  Extremities: No lower extremity edema.   LABS: Basic Metabolic Panel:  Recent Labs  07/07/14 0410 07/08/14 0425  NA 133* 132*  K 4.3 4.0  CL 97 100  CO2 21 20  GLUCOSE 201* 169*  BUN 10 9  CREATININE 0.63 0.76  CALCIUM 9.1 8.7   CBC:  Recent Labs  07/06/14 1449  07/06/14 2030  07/07/14 0410 07/07/14 1406 07/08/14 0425  WBC 26.0*  --  29.4*  < > 26.9*  --  24.7*  NEUTROABS 12.4*  --  15.0*  --   --   --   --   HGB 8.8*  < > 9.0*  < > 8.6* 8.3* 8.1*  HCT 29.3*  < > 30.1*  < > 27.2* 27.0* 27.1*  MCV 75.5*  --  77.8*  < > 74.5*  --  77.4*  PLT 469*  --  497*  < > 479*  --  425*  < > = values in this interval not displayed. Cardiac Enzymes:  Recent Labs  07/07/14 0830 07/07/14 1407 07/07/14 2107  TROPONINI 1.50* 1.05* 0.84*   Fasting Lipid Panel:  Recent Labs  07/07/14 1407  CHOL 181  HDL 43  LDLCALC 122*  TRIG 79  CHOLHDL 4.2    Current Meds: . amLODipine  10 mg Oral Daily  . anastrozole  1 mg Oral Daily  . aspirin EC  81 mg Oral Daily  . atenolol  25 mg Oral BID  . calcium-vitamin D  1 tablet Oral Q1200  . cholecalciferol  1,000 Units Oral Daily  . ezetimibe  10 mg Oral Daily  . insulin aspart  0-9 Units Subcutaneous TID WC  . irbesartan  300 mg Oral Daily  . levothyroxine  75 mcg Oral QAC breakfast  . [START ON 07/10/2014] pantoprazole (PROTONIX) IV  40 mg Intravenous  Q12H  . potassium chloride  10 mEq Oral Daily  . rosuvastatin  40 mg Oral q1800  . sodium chloride  3 mL Intravenous Q12H  . vitamin B-12  1,000 mcg Oral Daily    Echo 07/07/14:  Left ventricle: The cavity size was normal. Wall thickness was increased in a pattern of mild LVH. Systolic function was normal. The estimated ejection fraction was in the range of 60% to 65%. Wall motion was normal; there were no regional wall motion abnormalities. Doppler parameters are consistent with abnormal left ventricular relaxation (grade 1 diastolic dysfunction). - Aortic valve: Poorly visualized. Probably trileaflet; mildly calcified leaflets. There was no stenosis. - Mitral valve: Moderately calcified annulus. Mildly calcified leaflets . There was mild regurgitation. - Left atrium: The atrium was mildly dilated. - Right ventricle: The cavity size was normal. Systolic function was normal. - Pulmonic valve: No complete TR doppler jet so unable to estimate PA systolic pressure. - Inferior vena cava: The vessel was normal in  size. The respirophasic diameter changes were in the normal range (= 50%), consistent with normal central venous pressure.  Impressions:  - Normal LV size with mild LV hypertrophy. EF 60-65%. Normal RV size and systolic function. Mild MR.   ASSESSMENT AND PLAN:  1. NSTEMI: Likely due to demand ischemia in setting of anemia with GI bleeding. The patient is asymptomatic from a cardiac standpoint with no chest pain. Peak troponin 1.5 and now trending down to 0.8. This goes against an acute coronary syndrome. Echo with normal LV function. No further cardiac workup at this time. No ischemic workup is necessary.   2. Syncope: With standing. Likely orthostatic in setting of anemia.    3. Anemia with heme positive stools: workup per GI with EGD today.    4. HTN: titration meds per primary team.    5. Dyslipidemia: continue statin.   Will sign off. Please call with questions.      MCALHANY,CHRISTOPHER  8/27/20156:56 AM

## 2014-07-08 NOTE — Op Note (Signed)
[place], [id number]   ENDOSCOPY PROCEDURE REPORT  PATIENT: Molly [name], Molly [name]  MR#: [id number] BIRTHDATE: 1923/10/25 , 89  yrs. old GENDER: Female ENDOSCOPIST:Kmari Halter Oletta Lamas, MD REFERRED BY: [facility]. Dr. Chapman Fitch PROCEDURE DATE:  07/08/2014 PROCEDURE:   EGD ASA CLASS:   class III INDICATIONS:   anemia and positive stool MEDICATION:    fentanyl 25 mcg, versed 3 mg, Zofran 4 milligrams TOPICAL ANESTHETIC:cetacaine spray  DESCRIPTION OF PROCEDURE:   Theprocedure had been explained to the patient and consider obtain. In the left lateral decubitus position,  the scope was passed into the esophagus swallowing. the esophagus was normal with no esophagitis, Barrett's etc. there was a ring/stricture right at the GE junction that was easily passed and was widely patent. The stomach were centered and examined in the forward and retroflex view. No ulcers AVMs or other source of bleeding was seen. The duodenum was entered in the duodenal bulb and 2nd duodenum were normal. There was no sign of recent or current bleeding. The scope was withdrawn in the patient tolerated procedure well.     COMPLICATIONS: None  ENDOSCOPIC IMPRESSION: 1. Anemia/stool positive for FOB. 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  RECOMMENDATIONS: 1. will resume clear liquid diet. 2. We will recommend colonoscopy to her. She has stated in the past that she will not have a colonoscopy. She still refuses colonoscopy, I would do barium enema or virtual colonoscopy to rule out lesion in the colon.    _______________________________ Lorrin MaisLaurence Spates, MD 07/08/2014 3:22 PM CC: Dr Chapman Fitch

## 2014-07-08 NOTE — Care Management Note (Addendum)
    Page 1 of 1   07/09/2014     1:55:48 PM CARE MANAGEMENT NOTE 07/09/2014  Patient:  Molly [name], Molly [name]   Account Number:  [id number]  Date Initiated:  07/07/2014  Documentation initiated by:  Leafy Kindle  Subjective/Objective Assessment:   80 yo female admitted with syncope episode and GI bleed. Hx of Diabetes mellitus    Hypertension    Arthritis    MVP (mitral valve prolapse)    Hx of breast cancer     Action/Plan:   From home.  Daughter assists pt at home, but does not live with her.   Anticipated DC Date:  07/10/2014   Anticipated DC Plan:  [place]  CM consult      Choice offered to / List presented to:  C-1 Patient           Status of service:  In process, will continue to follow Medicare Important Message given?  YES (If response is "NO", the following Medicare IM given date fields will be blank) Date Medicare IM given:  07/09/2014 Medicare IM given by:  [facility] Date Additional Medicare IM given:   Additional Medicare IM given by:    Discharge Disposition:    Per UR Regulation:  Reviewed for med. necessity/level of care/duration of stay  If discussed at Long Length of Stay Meetings, dates discussed:    Comments:  07/08/14 Molly Eisenstein RN,BSN NCM [phone] PROVIDED PATIENT WHO DEFERRED TO DTR [place] LIST AS RESOURCE.WOULD RECOMMEND HHRN-DISEASE MGMNT.EGD TODAY.PT TO SEE PATIENT,AWAIT RECOMMENDATIONS.  07/07/14 Marney Doctor RN,BSN,NCM Pt receiving IV fluids, and awaiting GI consult. Cardiology is following as well.  Pt states that she has wheelchair, walker, cane and bsc at home already.  CM will await PT consult and assist with DC needs as needed.

## 2014-07-08 NOTE — H&P (View-Only) (Signed)
[facility] CONSULT Reason for consult: anemia and G.I. bleeding Referring Physician: [facility]. PCP: Dr. Chapman Fitch.  Molly [name] is an 80 y.o. female.  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 Amedeo Plenty. She has diabetes and hypertension as well as CLL and metastatic breast cancer. She had a syncopal episodes of visiting the oncologist yesterday and her hemoglobin and drop. Normally it's around 10 drop to 8. Her stools were positive. Patient states that her stools are occasionally dark that she is seen no bright red blood. She has never had colonoscopy and states to me that she will not have colonoscopy because she does not feel that she is to drinking all of the prep for colonoscopy. Apparently colonoscopy has been suggested several times and she has always refused. She adamantly denies using any NSAIDs and has had no abdominal pain. She is in aspirin 81 mg daily.  Past Medical History  Diagnosis Date  . Diabetes mellitus   . Hypertension   . Arthritis   . MVP (mitral valve prolapse)   . Hx of breast cancer     RIGHT BREAST  . Hypothyroidism     Past Surgical History  Procedure Laterality Date  . Hemiarthroplasty hip    . Knee arthroscopy      RIGHT KNEE  . Total abdominal hysterectomy w/ bilateral salpingoophorectomy    . Pelvic and para-aortic lymph node dissection    . Tonsillectomy    . Appendectomy    . Cholecystectomy    . US echocardiography  03/30/2009    EF 55-60%  . Cardiovascular stress test  01/28/2008    EF 74%    Family History  Problem Relation Age of Onset  . Hypertension Mother   . Heart attack Father   . Hypertension Father   . Diabetes Sister   . Hypertension Sister   . Liver cancer Sister   . Heart attack Brother   . Hypertension Brother   . Diabetes Brother   . Diabetes Sister   . Hypertension Sister     Social History:  reports that she has never smoked. She  does not have any smokeless tobacco history on file. She reports that she does not drink alcohol or use illicit drugs.  Allergies:  Allergies  Allergen Reactions  . Codeine     Makes her feel "crazy."  . Demerol     Makes her feel "crazy."  . Librium     Makes her feel "crazy."  . Lipitor [Atorvastatin Calcium]     Makes her feel "crazy."  . Metoprolol     Reaction unknown.     Medications; Prior to Admission medications   Medication Sig Start Date End Date Taking? Authorizing Provider  ACCU-CHEK FASTCLIX LANCETS [place]  04/28/14  Yes Historical Provider, MD  amLODipine-valsartan (EXFORGE) 10-320 MG per tablet Take 1 tablet by mouth daily. 02/17/14  Yes Sueanne Margarita, MD  anastrozole (ARIMIDEX) 1 MG tablet Take 1 mg by mouth daily.   Yes Historical Provider, MD  aspirin EC 81 MG tablet Take 81 mg by mouth daily.   Yes Historical Provider, MD  atenolol (TENORMIN) 25 MG tablet Take 1 tablet (25 mg total) by mouth 2 (two) times daily. 03/10/14  Yes Darlin Coco, MD  Calcium Carbonate-Vitamin D (CALCIUM + D PO) Take by mouth daily after lunch.    Yes Historical Provider, MD  cholecalciferol (VITAMIN D) 1000 UNITS tablet  Take 1,000 Units by mouth daily.   Yes Historical Provider, MD  ciprofloxacin (CIPRO) 500 MG tablet Take 1 tablet (500 mg total) by mouth 2 (two) times daily. 04/27/14  Yes Noland Fordyce, PA-C  Cyanocobalamin (VITAMIN B-12 PO) Take by mouth daily.     Yes Historical Provider, MD  fluticasone (FLONASE) 50 MCG/ACT nasal spray Place 2 sprays into the nose daily as needed for allergies or rhinitis.  11/26/11  Yes Historical Provider, MD  glimepiride (AMARYL) 4 MG tablet Take 4 mg by mouth daily with lunch.   Yes Historical Provider, MD  Glucose Blood (FREESTYLE LITE TEST VI) 3 (three) times daily as needed (blood sugar monitoring.).  08/27/13  Yes Historical Provider, MD  levothyroxine (SYNTHROID, LEVOTHROID) 75 MCG tablet Take 75 mcg by mouth daily.     Yes Historical Provider,  MD  metFORMIN (GLUCOPHAGE) 500 MG tablet Take 500 mg by mouth 2 (two) times daily. With lunch and dinner.   Yes Historical Provider, MD  metroNIDAZOLE (FLAGYL) 500 MG tablet Take 500 mg by mouth 2 (two) times daily. 04/27/14  Yes Noland Fordyce, PA-C  pantoprazole (PROTONIX) 40 MG tablet Take 40 mg by mouth daily as needed (acid).    Yes Historical Provider, MD  potassium chloride (K-DUR) 10 MEQ tablet Take 10 mEq by mouth daily.   Yes Historical Provider, MD  ZETIA 10 MG tablet Take 10 mg by mouth Daily.  01/31/12  Yes Historical Provider, MD   . amLODipine  10 mg Oral Daily  . [START ON 07/08/2014] aspirin EC  81 mg Oral Daily  . atenolol  25 mg Oral BID  . insulin aspart  0-9 Units Subcutaneous TID WC  . irbesartan  300 mg Oral Daily  . levothyroxine  75 mcg Oral QAC breakfast  . [START ON 07/10/2014] pantoprazole (PROTONIX) IV  40 mg Intravenous Q12H  . rosuvastatin  40 mg Oral q1800  . sodium chloride  3 mL Intravenous Q12H   PRN Meds acetaminophen, acetaminophen, hydrALAZINE, nitroGLYCERIN, ondansetron (ZOFRAN) IV, ondansetron Results for orders placed during the hospital encounter of 07/06/14 (from the past 48 hour(s))  CBG MONITORING, ED     Status: Abnormal   Collection Time    07/06/14  5:37 PM      Result Value Ref Range   Glucose-Capillary 233 (*) 70 - 99 mg/dL  I-STAT CHEM 8, ED     Status: Abnormal   Collection Time    07/06/14  5:47 PM      Result Value Ref Range   Sodium 133 (*) 137 - 147 mEq/L   Potassium 3.8  3.7 - 5.3 mEq/L   Chloride 99  96 - 112 mEq/L   BUN 11  6 - 23 mg/dL   Creatinine, Ser 0.60  0.50 - 1.10 mg/dL   Glucose, Bld 221 (*) 70 - 99 mg/dL   Calcium, Ion 1.19  1.13 - 1.30 mmol/L   TCO2 25  0 - 100 mmol/L   Hemoglobin 10.2 (*) 12.0 - 15.0 g/dL   HCT 30.0 (*) 36.0 - 46.0 %  I-STAT TROPOININ, ED     Status: None   Collection Time    07/06/14  6:25 PM      Result Value Ref Range   Troponin i, poc 0.00  0.00 - 0.08 ng/mL   Comment 3             Comment: Due to the release kinetics of cTnI,     a negative result within the  first hours     of the onset of symptoms does not rule out     myocardial infarction with certainty.     If myocardial infarction is still suspected,     repeat the test at appropriate intervals.  CBC WITH DIFFERENTIAL     Status: Abnormal   Collection Time    07/06/14  8:30 PM      Result Value Ref Range   WBC 29.4 (*) 4.0 - 10.5 K/uL   RBC 3.87  3.87 - 5.11 MIL/uL   Hemoglobin 9.0 (*) 12.0 - 15.0 g/dL   HCT 30.1 (*) 36.0 - 46.0 %   MCV 77.8 (*) 78.0 - 100.0 fL   MCH 23.3 (*) 26.0 - 34.0 pg   MCHC 29.9 (*) 30.0 - 36.0 g/dL   RDW 14.8  11.5 - 15.5 %   Platelets 497 (*) 150 - 400 K/uL   Neutrophils Relative % 51  43 - 77 %   Lymphocytes Relative 44  12 - 46 %   Monocytes Relative 4  3 - 12 %   Eosinophils Relative 1  0 - 5 %   Basophils Relative 0  0 - 1 %   Neutro Abs 15.0 (*) 1.7 - 7.7 K/uL   Lymphs Abs 12.9 (*) 0.7 - 4.0 K/uL   Monocytes Absolute 1.2 (*) 0.1 - 1.0 K/uL   Eosinophils Absolute 0.3  0.0 - 0.7 K/uL   Basophils Absolute 0.0  0.0 - 0.1 K/uL   RBC Morphology POLYCHROMASIA PRESENT     WBC Morphology ATYPICAL LYMPHOCYTES    POC OCCULT BLOOD, ED     Status: Abnormal   Collection Time    07/06/14  8:38 PM      Result Value Ref Range   Fecal Occult Bld POSITIVE (*) NEGATIVE  I-STAT TROPOININ, ED     Status: None   Collection Time    07/06/14  9:35 PM      Result Value Ref Range   Troponin i, poc 0.00  0.00 - 0.08 ng/mL   Comment 3            Comment: Due to the release kinetics of cTnI,     a negative result within the first hours     of the onset of symptoms does not rule out     myocardial infarction with certainty.     If myocardial infarction is still suspected,     repeat the test at appropriate intervals.  CBC     Status: Abnormal   Collection Time    07/07/14 12:47 AM      Result Value Ref Range   WBC 27.0 (*) 4.0 - 10.5 K/uL   RBC 3.71 (*) 3.87 - 5.11 MIL/uL   Hemoglobin  8.6 (*) 12.0 - 15.0 g/dL   HCT 28.2 (*) 36.0 - 46.0 %   MCV 76.0 (*) 78.0 - 100.0 fL   MCH 23.2 (*) 26.0 - 34.0 pg   MCHC 30.5  30.0 - 36.0 g/dL   RDW 14.5  11.5 - 15.5 %   Platelets 460 (*) 150 - 400 K/uL  TROPONIN I     Status: Abnormal   Collection Time    07/07/14 12:47 AM      Result Value Ref Range   Troponin I 0.42 (*) <0.30 ng/mL   Comment:            Due to the release kinetics of cTnI,     a negative result within  the first hours     of the onset of symptoms does not rule out     myocardial infarction with certainty.     If myocardial infarction is still suspected,     repeat the test at appropriate intervals.     CRITICAL RESULT CALLED TO, READ BACK BY AND VERIFIED WITH:     J.ASARO RN AT 0142 ON [id number] BY C.BONGEL  TYPE AND SCREEN     Status: None   Collection Time    07/07/14 12:50 AM      Result Value Ref Range   ABO/RH(D) A POS     Antibody Screen NEG     Sample Expiration 07/10/2014    TROPONIN I     Status: Abnormal   Collection Time    07/07/14  2:30 AM      Result Value Ref Range   Troponin I 0.70 (*) <0.30 ng/mL   Comment:            Due to the release kinetics of cTnI,     a negative result within the first hours     of the onset of symptoms does not rule out     myocardial infarction with certainty.     If myocardial infarction is still suspected,     repeat the test at appropriate intervals.     CRITICAL VALUE NOTED.  VALUE IS CONSISTENT WITH PREVIOUSLY REPORTED AND CALLED VALUE.  LIPASE, BLOOD     Status: None   Collection Time    07/07/14  2:30 AM      Result Value Ref Range   Lipase 23  11 - 59 U/L  CBC     Status: Abnormal   Collection Time    07/07/14  4:10 AM      Result Value Ref Range   WBC 26.9 (*) 4.0 - 10.5 K/uL   RBC 3.65 (*) 3.87 - 5.11 MIL/uL   Hemoglobin 8.6 (*) 12.0 - 15.0 g/dL   HCT 27.2 (*) 36.0 - 46.0 %   MCV 74.5 (*) 78.0 - 100.0 fL   MCH 23.6 (*) 26.0 - 34.0 pg   MCHC 31.6  30.0 - 36.0 g/dL   RDW 14.6  11.5 - 15.5 %    Platelets 479 (*) 150 - 400 K/uL  COMPREHENSIVE METABOLIC PANEL     Status: Abnormal   Collection Time    07/07/14  4:10 AM      Result Value Ref Range   Sodium 133 (*) 137 - 147 mEq/L   Potassium 4.3  3.7 - 5.3 mEq/L   Chloride 97  96 - 112 mEq/L   CO2 21  19 - 32 mEq/L   Glucose, Bld 201 (*) 70 - 99 mg/dL   BUN 10  6 - 23 mg/dL   Creatinine, Ser 0.63  0.50 - 1.10 mg/dL   Calcium 9.1  8.4 - 10.5 mg/dL   Total Protein 6.2  6.0 - 8.3 g/dL   Albumin 3.1 (*) 3.5 - 5.2 g/dL   AST 16  0 - 37 U/L   ALT 14  0 - 35 U/L   Alkaline Phosphatase 99  39 - 117 U/L   Total Bilirubin 0.3  0.3 - 1.2 mg/dL   GFR calc non Af Amer 78 (*) >90 mL/min   GFR calc Af Amer >90  >90 mL/min   Comment: (NOTE)     The eGFR has been calculated using the CKD EPI equation.     This  calculation has not been validated in all clinical situations.     eGFR's persistently <90 mL/min signify possible Chronic Kidney     Disease.   Anion gap 15  5 - 15  GLUCOSE, CAPILLARY     Status: Abnormal   Collection Time    07/07/14  4:56 AM      Result Value Ref Range   Glucose-Capillary 189 (*) 70 - 99 mg/dL  GLUCOSE, CAPILLARY     Status: Abnormal   Collection Time    07/07/14  6:44 AM      Result Value Ref Range   Glucose-Capillary 188 (*) 70 - 99 mg/dL  GLUCOSE, CAPILLARY     Status: Abnormal   Collection Time    07/07/14  8:23 AM      Result Value Ref Range   Glucose-Capillary 183 (*) 70 - 99 mg/dL  TROPONIN I     Status: Abnormal   Collection Time    07/07/14  8:30 AM      Result Value Ref Range   Troponin I 1.50 (*) <0.30 ng/mL   Comment:            Due to the release kinetics of cTnI,     a negative result within the first hours     of the onset of symptoms does not rule out     myocardial infarction with certainty.     If myocardial infarction is still suspected,     repeat the test at appropriate intervals.     CRITICAL VALUE NOTED.  VALUE IS CONSISTENT WITH PREVIOUSLY REPORTED AND CALLED VALUE.   GLUCOSE, CAPILLARY     Status: Abnormal   Collection Time    07/07/14 12:02 PM      Result Value Ref Range   Glucose-Capillary 158 (*) 70 - 99 mg/dL   Comment 1 Notify RN     Comment 2 Documented in Chart      Dg Chest 2 View  07/06/2014   CLINICAL DATA:  Dizziness.  Nausea.  Hypertension.  Diabetic.  EXAM: CHEST  2 VIEW  COMPARISON:  09/2010  FINDINGS: Apical lordotic portable view. Numerous leads and wires project over the chest. Normal heart size. No pleural effusion or pneumothorax. Low lung volumes with resultant pulmonary interstitial prominence. Clear lungs.  IMPRESSION: No acute cardiopulmonary disease.   Electronically Signed   By: Abigail Miyamoto M.D.   On: 07/06/2014 19:06               Blood pressure 166/55, pulse 84, temperature 98.7 F (37.1 C), temperature source Oral, resp. rate 20, height 5' 5"  (1.651 m), weight 90.765 kg (200 lb 1.6 oz), SpO2 95.00%.  Physical exam:   General-- talkative alert white female in no acute distress Heart-- regular rate and rhythm without murmurs are gallops Lungs--clear Abdomen-- soft and nontender   Assessment: 1. G.I. bleeding. This is low-grade but is enough to cause her to be anemic and probably contributing to her cardiac issues. The issue is whether or not she can be anticoagulated if needed. I think she could stand an EGD. She refuses to consider colonoscopy at this time. 2. Questionable NSTEMI with slight elevation of cardiac enzymes. Patient has been seen by cardiology and is not felt that she needs anticoagulation at this time. 3. History of breast cancer metastatic bone with several pathological fractures 4. Diabetes 5. Hypertension 6. CLL. 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 7. Chronic UTIs/bladder spasms  Plan: 1. We will  plan EGD tomorrow to rule out gross lesion of the upper G.I. track contributing to her bleeding. There is an APM will try to cauterize it etc. Would keep her on PPI therapy  for now. 2. Patient states that she will not have colonoscopy.   Kayliegh Boyers JR,Charlen Bakula L 07/07/2014, 2:02 PM

## 2014-07-08 NOTE — Progress Notes (Signed)
PT Cancellation Note  Patient Details Name: Molly [name] MRN: [id number] DOB: October 02, 1923   Cancelled Treatment:    Reason Eval/Treat Not Completed: Patient at procedure or test/unavailable (endoscopy)   Maki Hege,KATHrine E 07/08/2014, 2:11 PM. Carmelia Bake, PT, DPT 07/08/2014 Pager: [phone]

## 2014-07-08 NOTE — Progress Notes (Signed)
Patient had a large stool x 1 with streaks of blood noted in the stool. No c/o pain/discomfort. Will continue to monitor.

## 2014-07-08 NOTE — Progress Notes (Signed)
[facility] PROGRESS NOTE  Molly [name] JQB:[id number] DOB: November 21, 1922 DOA: 07/06/2014 PCP: Antony Blackbird, MD  Assessment/Plan  NSTEMI due to demand ischemia per cardiology.  Low suspicion for NSTEMI type 1.  -  Troponins peaked at 1.5 on 8/26 -  Telemetry:  NSR -  Continue BB -  D/c crestor  -  Continue daily ASA  -  Appreciate Cardiology assistance -  A1c and lipid panel as below  Lab Results  Component Value Date   CHOL 181 07/07/2014   HDL 43 07/07/2014   LDLCALC 122* 07/07/2014   TRIG 79 07/07/2014   CHOLHDL 4.2 07/07/2014   Lab Results  Component Value Date   HGBA1C 9.1* 07/07/2014     GI bleed, hgb drifting down, last BM with some blood.   -  EGD today -  Transfuse for hgb < 7 -  Continue PPI  Anemia of chronic ds, malignancy, and GIB -  Transfuse for hgb < 7    Syncope and dizziness - possibly due to arrhythmia or NSTEMI -  ECHO:  Mild LVH, EF 60-65%, normal wall motion with grade 1 DD, mild MR -  Tele:  NSR -  Continue IVF while NPO  Hypertension - hypertensive -  Continue norvasc/ARB, atenolol -  Start bidil  Diabetes mellitus with peripheral neuropathy, uncontrolled, A1c 9.1, CBG well controlled since admission -  Continue SSI  Hypothyroidism -  TSH 1.43 -  Continue synthroid  History of metastatic breast cancer - per oncologist Dr. Benay Spice  History of CLL with chronic stable leukocytosis - per oncologist Dr. Benay Spice  Mild hyponatremia, chronic, likely reset osmostat -  Trend sodium  DIET:  NPO  Access:  PIV IVF:  yes Proph:  SCD  Code Status: Full code.  Family Communication: Patient's daughter at the bedside.  Disposition Plan: Inpatient.   Consultants:  [facility], followed by Dr. Mare Ferrari  Procedures:  ECHO  EGD on 8/27  Antibiotics:  none   HPI/Subjective:  Denies abdominal pain.  Had large BM last night with some blood. Denies nausea, vomiting.  Denies CP, SOB.    Objective: Filed Vitals:    07/07/14 0944 07/07/14 2132 07/08/14 0435 07/08/14 0946  BP: 166/55 167/55 183/62 156/61  Pulse: 84 68 70 68  Temp:  98.2 F (36.8 C) 98.6 F (37 C)   TempSrc:  Oral Oral   Resp:  20    Height:      Weight:      SpO2:  95% 98%     Intake/Output Summary (Last 24 hours) at 07/08/14 1213 Last data filed at 07/08/14 0715  Gross per 24 hour  Intake      0 ml  Output    200 ml  Net   -200 ml   Filed Weights   07/06/14 2245  Weight: 90.765 kg (200 lb 1.6 oz)    Exam:   General:  WF, No acute distress  HEENT:  NCAT, MMM  Cardiovascular:  RRR, nl S1, S2 3/6 left sternal border, 2+ pulses, warm extremities  Respiratory:  CTAB, no increased WOB  Abdomen:   NABS, soft, NT/ND  MSK:   Normal tone and bulk, no LEE  Neuro:  Grossly intact  Data Reviewed: Basic Metabolic Panel:  Recent Labs Lab 07/06/14 1747 07/07/14 0410 07/08/14 0425  NA 133* 133* 132*  K 3.8 4.3 4.0  CL 99 97 100  CO2  --  21 20  GLUCOSE 221* 201* 169*  BUN 11  10 9  CREATININE 0.60 0.63 0.76  CALCIUM  --  9.1 8.7   Liver Function Tests:  Recent Labs Lab 07/07/14 0410  AST 16  ALT 14  ALKPHOS 99  BILITOT 0.3  PROT 6.2  ALBUMIN 3.1*    Recent Labs Lab 07/07/14 0230  LIPASE 23   No results found for this basename: AMMONIA,  in the last 168 hours CBC:  Recent Labs Lab 07/06/14 1449  07/06/14 2030 07/07/14 0047 07/07/14 0410 07/07/14 1406 07/08/14 0425  WBC 26.0*  --  29.4* 27.0* 26.9*  --  24.7*  NEUTROABS 12.4*  --  15.0*  --   --   --   --   HGB 8.8*  < > 9.0* 8.6* 8.6* 8.3* 8.1*  HCT 29.3*  < > 30.1* 28.2* 27.2* 27.0* 27.1*  MCV 75.5*  --  77.8* 76.0* 74.5*  --  77.4*  PLT 469*  --  497* 460* 479*  --  425*  < > = values in this interval not displayed. Cardiac Enzymes:  Recent Labs Lab 07/07/14 0047 07/07/14 0230 07/07/14 0830 07/07/14 1407 07/07/14 2107  TROPONINI 0.42* 0.70* 1.50* 1.05* 0.84*   BNP (last 3 results) No results found for this basename:  PROBNP,  in the last 8760 hours CBG:  Recent Labs Lab 07/07/14 1653 07/07/14 2126 07/08/14 0133 07/08/14 0429 07/08/14 0749  GLUCAP 154* 133* 151* 158* 169*    No results found for this or any previous visit (from the past 240 hour(s)).   Studies: Dg Chest 2 View  07/06/2014   CLINICAL DATA:  Dizziness.  Nausea.  Hypertension.  Diabetic.  EXAM: CHEST  2 VIEW  COMPARISON:  09/2010  FINDINGS: Apical lordotic portable view. Numerous leads and wires project over the chest. Normal heart size. No pleural effusion or pneumothorax. Low lung volumes with resultant pulmonary interstitial prominence. Clear lungs.  IMPRESSION: No acute cardiopulmonary disease.   Electronically Signed   By: Abigail Miyamoto M.D.   On: 07/06/2014 19:06    Scheduled Meds: . amLODipine  10 mg Oral Daily  . anastrozole  1 mg Oral Daily  . aspirin EC  81 mg Oral Daily  . atenolol  50 mg Oral BID  . calcium-vitamin D  1 tablet Oral Q1200  . cholecalciferol  1,000 Units Oral Daily  . ezetimibe  10 mg Oral Daily  . insulin aspart  0-9 Units Subcutaneous TID WC  . irbesartan  300 mg Oral Daily  . levothyroxine  75 mcg Oral QAC breakfast  . [START ON 07/10/2014] pantoprazole (PROTONIX) IV  40 mg Intravenous Q12H  . potassium chloride  10 mEq Oral Daily  . rosuvastatin  40 mg Oral q1800  . sodium chloride  3 mL Intravenous Q12H  . vitamin B-12  1,000 mcg Oral Daily   Continuous Infusions: . pantoprozole (PROTONIX) infusion 8 mg/hr (07/08/14 5465)    Principal Problem:   GI bleed Active Problems:   Breast cancer metastasized to bone   Type 2 diabetes mellitus with peripheral neuropathy   Anemia, blood loss   Syncope   HTN (hypertension)   NSTEMI (non-ST elevated myocardial infarction)    Time spent: 30 min    [name], [facility] Pager [phone]. If 7PM-7AM, please contact night-coverage at [email or web address], password [facility] 07/08/2014, 12:13 PM  LOS: 2 days

## 2014-07-08 NOTE — Interval H&P Note (Signed)
History and Physical Interval Note:  07/08/2014 2:52 PM  Molly [name]  has presented today for surgery, with the diagnosis of gi bleeding  The various methods of treatment have been discussed with the patient and family. After consideration of risks, benefits and other options for treatment, the patient has consented to  Procedure(s): ESOPHAGOGASTRODUODENOSCOPY (EGD) (N/A) as a surgical intervention .  The patient's history has been reviewed, patient examined, no change in status, stable for surgery.  I have reviewed the patient's chart and labs.  Questions were answered to the patient's satisfaction.     Kharma Sampsel JR,Rose Hippler L

## 2014-07-09 ENCOUNTER — Inpatient Hospital Stay (HOSPITAL_COMMUNITY): Payer: Medicare Other

## 2014-07-09 ENCOUNTER — Encounter (HOSPITAL_COMMUNITY): Payer: Self-pay | Admitting: Gastroenterology

## 2014-07-09 DIAGNOSIS — E1142 Type 2 diabetes mellitus with diabetic polyneuropathy: Secondary | ICD-10-CM

## 2014-07-09 DIAGNOSIS — E1149 Type 2 diabetes mellitus with other diabetic neurological complication: Secondary | ICD-10-CM

## 2014-07-09 LAB — GLUCOSE, CAPILLARY
GLUCOSE-CAPILLARY: 141 mg/dL — AB (ref 70–99)
Glucose-Capillary: 152 mg/dL — ABNORMAL HIGH (ref 70–99)
Glucose-Capillary: 164 mg/dL — ABNORMAL HIGH (ref 70–99)
Glucose-Capillary: 175 mg/dL — ABNORMAL HIGH (ref 70–99)
Glucose-Capillary: 128 mg/dL — ABNORMAL HIGH (ref 70–99)

## 2014-07-09 LAB — CLOSTRIDIUM DIFFICILE BY PCR: Toxigenic C. Difficile by PCR: NEGATIVE

## 2014-07-09 LAB — BASIC METABOLIC PANEL
ANION GAP: 12 (ref 5–15)
BUN: 10 mg/dL (ref 6–23)
CHLORIDE: 99 meq/L (ref 96–112)
CO2: 20 meq/L (ref 19–32)
Calcium: 8.7 mg/dL (ref 8.4–10.5)
Creatinine, Ser: 0.92 mg/dL (ref 0.50–1.10)
GFR calc Af Amer: 63 mL/min — ABNORMAL LOW (ref >90)
GFR calc non Af Amer: 54 mL/min — ABNORMAL LOW (ref >90)
Glucose, Bld: 156 mg/dL — ABNORMAL HIGH (ref 70–99)
Potassium: 4 mEq/L (ref 3.7–5.3)
Sodium: 131 mEq/L — ABNORMAL LOW (ref 137–147)

## 2014-07-09 LAB — CBC
HEMATOCRIT: 24.4 % — AB (ref 36.0–46.0)
HEMOGLOBIN: 7.6 g/dL — AB (ref 12.0–15.0)
MCH: 23.7 pg — ABNORMAL LOW (ref 26.0–34.0)
MCHC: 31.1 g/dL (ref 30.0–36.0)
MCV: 76 fL — ABNORMAL LOW (ref 78.0–100.0)
Platelets: 412 10*3/uL — ABNORMAL HIGH (ref 150–400)
RBC: 3.21 MIL/uL — ABNORMAL LOW (ref 3.87–5.11)
RDW: 14.8 % (ref 11.5–15.5)
WBC: 27.5 10*3/uL — ABNORMAL HIGH (ref 4.0–10.5)

## 2014-07-09 LAB — PREPARE RBC (CROSSMATCH)

## 2014-07-09 MED ORDER — SODIUM CHLORIDE 0.9 % IV SOLN
INTRAVENOUS | Status: DC
  Administered 2014-07-09: 20:00:00 via INTRAVENOUS

## 2014-07-09 MED ORDER — PANTOPRAZOLE SODIUM 40 MG PO TBEC
40.0000 mg | DELAYED_RELEASE_TABLET | Freq: Every day | ORAL | Status: DC
  Administered 2014-07-10 – 2014-07-11 (×2): 40 mg via ORAL
  Filled 2014-07-09 (×2): qty 1

## 2014-07-09 MED ORDER — SODIUM CHLORIDE 0.9 % IV SOLN
Freq: Once | INTRAVENOUS | Status: AC
  Administered 2014-07-09: 08:00:00 via INTRAVENOUS

## 2014-07-09 MED ORDER — PEG 3350-KCL-NA BICARB-NACL 420 G PO SOLR
4000.0000 mL | Freq: Once | ORAL | Status: AC
  Administered 2014-07-09: 4000 mL via ORAL

## 2014-07-09 NOTE — Progress Notes (Signed)
[facility] PROGRESS NOTE  Molly [name] MVH:[id number] DOB: 20-Sep-1923 DOA: 07/06/2014 PCP: Antony Blackbird, MD  Assessment/Plan  GI bleed and now with diarrhea, hgb continuing to drift down -  Appreciate GI assistance -  EGD was unremarkable on 8/27 -  Patient now amenable to colonoscopy -  Change to oral PPI -  CLD for possible colonoscopy tomorrow -  C. Diff negative  NSTEMI due to demand ischemia per cardiology.  Low suspicion for NSTEMI type 1.  -  Troponins peaked at 1.5 on 8/26 -  Telemetry:  NSR x 72 hours, okay to d/c -  Continue BB -  Continue daily ASA  -  Appreciate Cardiology assistance -  A1c and lipid panel as below  Episodic syncope, palpitations, and dizziness - possibly due to arrhythmia or NSTEMI or due to worsening anemia -  ECHO:  Mild LVH, EF 60-65%, normal wall motion with grade 1 DD, mild MR -  Tele:  NSR x 72 hours -  Transfusion today  Hypertension - hypertensive but improving -  Continue norvasc/ARB, atenolol -  Continue bidil  Diabetes mellitus with peripheral neuropathy, uncontrolled, A1c 9.1, CBG well controlled since admission.  Asking about peripheral neuropathy medication.   -  Continue SSI -  Most medications for peripheral neuropathy have significant side effects, including lightheadness/balance problems.  Will defer starting any medications at this time.   Hypothyroidism, TSH 1.43, continue synthroid  Metastatic breast cancer, stable, continue anastrazole   CLL with chronic stable leukocytosis, appreciate Oncology assistance  Mild hyponatremia, chronic, likely reset osmostat, stable   Leukocytosis due to CLL, chronic  Anemia of chronic disease, malignancy, and acute blood loss due to GIB -  Transfuse 1 unit PRBC -  Dr. Benay Spice recommending hemocyte.  Will give Rx for 100mg  BID at discharge  DIET:  CLD  Access:  PIV IVF:  yes Proph:  SCD  Code Status: Full code.  Family Communication: Patient's daughter at the  bedside.  Disposition Plan:   Awaiting further work up for anemia.  PT/OT assessment today.    Consultants:  [facility], Dr. Oletta Lamas  Cardiology, followed by Dr. Mare Ferrari  Procedures:  ECHO  EGD on 8/27  Antibiotics:  none   HPI/Subjective:  Could not sleep last night.  Denies nausea, vomiting.  Hungry.  Denies CP, SOB.    Objective: Filed Vitals:   07/09/14 0925 07/09/14 1025 07/09/14 1125 07/09/14 1204  BP: 134/50 159/53 144/58 145/44  Pulse: 59 58 57 89  Temp: 97.5 F (36.4 C) 97 F (36.1 C) 97.6 F (36.4 C) 97.4 F (36.3 C)  TempSrc: Oral Axillary Oral Oral  Resp: 18 18 18 18   Height:      Weight:      SpO2:  97% 97% 94%    Intake/Output Summary (Last 24 hours) at 07/09/14 1634 Last data filed at 07/09/14 1248  Gross per 24 hour  Intake 1092.5 ml  Output    600 ml  Net  492.5 ml   Filed Weights   07/06/14 2245  Weight: 90.765 kg (200 lb 1.6 oz)    Exam:   General:  WF, No acute distress  HEENT:  NCAT, MMM  Cardiovascular:  RRR, nl S1, S2 3/6 left sternal border, 2+ pulses, warm extremities  Respiratory:  CTAB, no increased WOB  Abdomen:   NABS, soft, NT/ND  MSK:   Normal tone and bulk, no LEE  Neuro:  Grossly intact  Data Reviewed: Basic Metabolic Panel:  Recent Labs Lab  07/06/14 1747 07/07/14 0410 07/08/14 0425 07/09/14 0450  NA 133* 133* 132* 131*  K 3.8 4.3 4.0 4.0  CL 99 97 100 99  CO2  --  21 20 20   GLUCOSE 221* 201* 169* 156*  BUN 11 10 9 10   CREATININE 0.60 0.63 0.76 0.92  CALCIUM  --  9.1 8.7 8.7   Liver Function Tests:  Recent Labs Lab 07/07/14 0410  AST 16  ALT 14  ALKPHOS 99  BILITOT 0.3  PROT 6.2  ALBUMIN 3.1*    Recent Labs Lab 07/07/14 0230  LIPASE 23   No results found for this basename: AMMONIA,  in the last 168 hours CBC:  Recent Labs Lab 07/06/14 1449  07/06/14 2030 07/07/14 0047 07/07/14 0410 07/07/14 1406 07/08/14 0425 07/09/14 0450  WBC 26.0*  --  29.4* 27.0* 26.9*  --  24.7*  27.5*  NEUTROABS 12.4*  --  15.0*  --   --   --   --   --   HGB 8.8*  < > 9.0* 8.6* 8.6* 8.3* 8.1* 7.6*  HCT 29.3*  < > 30.1* 28.2* 27.2* 27.0* 27.1* 24.4*  MCV 75.5*  --  77.8* 76.0* 74.5*  --  77.4* 76.0*  PLT 469*  --  497* 460* 479*  --  425* 412*  < > = values in this interval not displayed. Cardiac Enzymes:  Recent Labs Lab 07/07/14 0047 07/07/14 0230 07/07/14 0830 07/07/14 1407 07/07/14 2107  TROPONINI 0.42* 0.70* 1.50* 1.05* 0.84*   BNP (last 3 results) No results found for this basename: PROBNP,  in the last 8760 hours CBG:  Recent Labs Lab 07/08/14 1556 07/08/14 1718 07/08/14 2010 07/09/14 0014 07/09/14 0354  GLUCAP 132* 243* 158* 141* 152*    Recent Results (from the past 240 hour(s))  CLOSTRIDIUM DIFFICILE BY PCR     Status: None   Collection Time    07/09/14  9:03 AM      Result Value Ref Range Status   C difficile by pcr NEGATIVE  NEGATIVE Final   Comment: Performed at [facility]     Studies: No results found.  Scheduled Meds: . amLODipine  10 mg Oral Daily  . anastrozole  1 mg Oral Daily  . aspirin EC  81 mg Oral Daily  . atenolol  50 mg Oral BID  . calcium-vitamin D  1 tablet Oral Q1200  . cholecalciferol  1,000 Units Oral Daily  . ezetimibe  10 mg Oral Daily  . insulin aspart  0-9 Units Subcutaneous TID WC  . irbesartan  300 mg Oral Daily  . isosorbide-hydrALAZINE  1 tablet Oral TID  . levothyroxine  75 mcg Oral QAC breakfast  . [START ON 07/10/2014] pantoprazole  40 mg Oral Daily  . potassium chloride  10 mEq Oral Daily  . sodium chloride  3 mL Intravenous Q12H  . vitamin B-12  1,000 mcg Oral Daily   Continuous Infusions:    Principal Problem:   GI bleed Active Problems:   Breast cancer metastasized to bone   Type 2 diabetes mellitus with peripheral neuropathy   Anemia, blood loss   Syncope   HTN (hypertension)   NSTEMI (non-ST elevated myocardial infarction)    Time spent: 30 min    Damien Cisar, [place]  [facility] Pager [phone]. If 7PM-7AM, please contact night-coverage at [email or web address], password [facility] 07/09/2014, 4:34 PM  LOS: 3 days

## 2014-07-09 NOTE — Progress Notes (Signed)
[facility] PROGRESS NOTE Subjective patient has not had any gross bleeding but stools are positive for FOB.  Objective: Vital signs in last 24 hours: Temp:  [98.2 F (36.8 C)-98.4 F (36.9 C)] 98.2 F (36.8 C) (08/28 0530) Pulse Rate:  [60-71] 60 (08/28 0530) Resp:  [15-22] 18 (08/28 0530) BP: (133-204)/(41-109) 137/41 mmHg (08/28 0530) SpO2:  [93 %-99 %] 95 % (08/28 0530) Last BM Date: 07/09/14  Intake/Output from previous day: 08/27 0701 - 08/28 0700 In: 597.5 [I.V.:597.5] Out: 900 [Urine:900] Intake/Output this shift:    PE:   Abdomen-- soft and nontender  Lab Results:  Recent Labs  07/06/14 2030 07/07/14 0047 07/07/14 0410 07/07/14 1406 07/08/14 0425 07/09/14 0450  WBC 29.4* 27.0* 26.9*  --  24.7* 27.5*  HGB 9.0* 8.6* 8.6* 8.3* 8.1* 7.6*  HCT 30.1* 28.2* 27.2* 27.0* 27.1* 24.4*  PLT 497* 460* 479*  --  425* 412*   BMET  Recent Labs  07/06/14 1747 07/07/14 0410 07/08/14 0425 07/09/14 0450  NA 133* 133* 132* 131*  K 3.8 4.3 4.0 4.0  CL 99 97 100 99  CO2  --  21 20 20   CREATININE 0.60 0.63 0.76 0.92   LFT  Recent Labs  07/07/14 0410  PROT 6.2  AST 16  ALT 14  ALKPHOS 99  BILITOT 0.3   PT/INR  Recent Labs  07/07/14 1406  LABPROT 15.2  INR 1.20   PANCREAS  Recent Labs  07/07/14 0230  LIPASE 23         Studies/Results: No results found.  Medications: I have reviewed the patient's current medications.  Assessment/Plan: 1. Iron deficiency anemia/heme positive stool. Long discussion with the patient. It is possible she has AVMs throughout her G.I. tract. Her EGD yesterday was nonrevealing. Long discussion with she and her family about colonoscopy. She refuses colonoscopy at this time because she does not think she can tolerate the prep. She states that she will consent to a barium enema. I have explained that if an abnormality is found on barium enema she may need colonoscopy as well and she does  understand.   Kymari Lollis JR,Ardyn [place] L 07/09/2014, 8:55 AM

## 2014-07-09 NOTE — Evaluation (Signed)
Physical Therapy Evaluation Patient Details Name: TYRONICA TRUXILLO MRN: [id number] DOB: May 05, 1923 Today's Date: 07/09/2014   History of Present Illness  80 yo female admitted with GI bleed, syncope, decreased hemoglobin. Hx of met breast cancer, DM, HTN.   Clinical Impression  On eval, pt required Min assist for mobility-able to ambulate ~20 feet in room with walker. Max encouragement for pt participation-pt/family resistant. MD assisted with encouraging pt to participate as well. Pt tolerated activity fairly well however she declined to ambulate any farther.     Follow Up Recommendations Home health PT;Supervision/Assistance - 24 hour    Equipment Recommendations  None recommended by PT    Recommendations for Other Services OT consult     Precautions / Restrictions Precautions Precautions: Fall Restrictions Weight Bearing Restrictions: No      Mobility  Bed Mobility Overal bed mobility: Modified Independent                Transfers Overall transfer level: Needs assistance Equipment used: Rolling walker (2 wheeled) Transfers: Sit to/from Stand Sit to Stand: Min guard         General transfer comment: close guard for safety. VCS safety, technique, hand placement.   Ambulation/Gait Ambulation/Gait assistance: Min guard Ambulation Distance (Feet): 20 Feet Assistive device: Rolling walker (2 wheeled) Gait Pattern/deviations: Trunk flexed;Decreased stride length;Step-through pattern     General Gait Details: assist to stabilize. Mod VCs for walker safety. Pt declined to ambulate any farther then 1 lap around room.   Stairs            Wheelchair Mobility    Modified Rankin (Stroke Patients Only)       Balance Overall balance assessment: Needs assistance         Standing balance support: Bilateral upper extremity supported;During functional activity                                 Pertinent Vitals/Pain Pain Assessment:  No/denies pain    Home Living Family/patient expects to be discharged to:: Private residence Living Arrangements: Children   Type of Home: House Home Access: Stairs to enter   Technical brewer of Steps: 1 Home Layout: One level Home Equipment: Environmental consultant - 2 wheels;Bedside commode;Walker - 4 wheels      Prior Function Level of Independence: Independent with assistive device(s)               Hand Dominance        Extremity/Trunk Assessment   Upper Extremity Assessment: Generalized weakness           Lower Extremity Assessment: Generalized weakness      Cervical / Trunk Assessment: Kyphotic  Communication   Communication: No difficulties  Cognition Arousal/Alertness: Awake/alert Behavior During Therapy: WFL for tasks assessed/performed Overall Cognitive Status: Within Functional Limits for tasks assessed                      General Comments      Exercises        Assessment/Plan    PT Assessment Patient needs continued PT services  PT Diagnosis Difficulty walking;Generalized weakness   PT Problem List Decreased strength;Decreased activity tolerance;Decreased balance;Decreased mobility;Obesity;Decreased knowledge of use of DME  PT Treatment Interventions DME instruction;Gait training;Functional mobility training;Therapeutic activities;Patient/family education;Balance training;Therapeutic exercise   PT Goals (Current goals can be found in the Care Plan section) Acute Rehab PT Goals Patient Stated Goal: home. get stronger  PT Goal Formulation: With patient Time For Goal Achievement: 07/23/14 Potential to Achieve Goals: Good    Frequency Min 3X/week   Barriers to discharge        Co-evaluation               End of Session   Activity Tolerance: Patient limited by fatigue Patient left: with call bell/phone within reach;with family/visitor present           Time: 3818-4037 PT Time Calculation (min): 24 min   Charges:    PT Evaluation $Initial PT Evaluation Tier I: 1 Procedure PT Treatments $Gait Training: 8-22 mins   PT G Codes:          Weston Anna, MPT Pager: [phone]

## 2014-07-10 ENCOUNTER — Encounter (HOSPITAL_COMMUNITY)
Admission: EM | Disposition: A | Discharge status: Home / Self Care | Payer: PRIVATE HEALTH INSURANCE | Source: Home / Self Care | Attending: Internal Medicine

## 2014-07-10 ENCOUNTER — Encounter (HOSPITAL_COMMUNITY): Payer: Self-pay | Admitting: *Deleted

## 2014-07-10 HISTORY — PX: COLONOSCOPY: SHX5424

## 2014-07-10 LAB — GLUCOSE, CAPILLARY
GLUCOSE-CAPILLARY: 153 mg/dL — AB (ref 70–99)
Glucose-Capillary: 126 mg/dL — ABNORMAL HIGH (ref 70–99)
Glucose-Capillary: 131 mg/dL — ABNORMAL HIGH (ref 70–99)
Glucose-Capillary: 135 mg/dL — ABNORMAL HIGH (ref 70–99)
Glucose-Capillary: 160 mg/dL — ABNORMAL HIGH (ref 70–99)

## 2014-07-10 LAB — TYPE AND SCREEN
ABO/RH(D): A POS
Antibody Screen: NEGATIVE
Unit division: 0

## 2014-07-10 LAB — BASIC METABOLIC PANEL
ANION GAP: 12 (ref 5–15)
BUN: 8 mg/dL (ref 6–23)
CHLORIDE: 100 meq/L (ref 96–112)
CO2: 20 meq/L (ref 19–32)
Calcium: 8.6 mg/dL (ref 8.4–10.5)
Creatinine, Ser: 0.84 mg/dL (ref 0.50–1.10)
GFR calc Af Amer: 70 mL/min — ABNORMAL LOW (ref >90)
GFR calc non Af Amer: 60 mL/min — ABNORMAL LOW (ref >90)
Glucose, Bld: 145 mg/dL — ABNORMAL HIGH (ref 70–99)
POTASSIUM: 3.7 meq/L (ref 3.7–5.3)
SODIUM: 132 meq/L — AB (ref 137–147)

## 2014-07-10 LAB — CBC
HEMATOCRIT: 26.7 % — AB (ref 36.0–46.0)
HEMOGLOBIN: 8.3 g/dL — AB (ref 12.0–15.0)
MCH: 24.3 pg — ABNORMAL LOW (ref 26.0–34.0)
MCHC: 31.1 g/dL (ref 30.0–36.0)
MCV: 78.3 fL (ref 78.0–100.0)
Platelets: 365 10*3/uL (ref 150–400)
RBC: 3.41 MIL/uL — ABNORMAL LOW (ref 3.87–5.11)
RDW: 15.6 % — ABNORMAL HIGH (ref 11.5–15.5)
WBC: 23.9 10*3/uL — AB (ref 4.0–10.5)

## 2014-07-10 SURGERY — COLONOSCOPY
Anesthesia: Moderate Sedation

## 2014-07-10 MED ORDER — FERROUS FUMARATE 325 (106 FE) MG PO TABS
1.0000 | ORAL_TABLET | Freq: Two times a day (BID) | ORAL | Status: DC
  Administered 2014-07-10 – 2014-07-11 (×3): 106 mg via ORAL
  Filled 2014-07-10 (×5): qty 1

## 2014-07-10 MED ORDER — FENTANYL CITRATE 0.05 MG/ML IJ SOLN
INTRAMUSCULAR | Status: AC
  Filled 2014-07-10: qty 2

## 2014-07-10 MED ORDER — MIDAZOLAM HCL 5 MG/5ML IJ SOLN
INTRAMUSCULAR | Status: DC | PRN
  Administered 2014-07-10 (×2): 1 mg via INTRAVENOUS
  Administered 2014-07-10: 2 mg via INTRAVENOUS

## 2014-07-10 MED ORDER — FENTANYL CITRATE 0.05 MG/ML IJ SOLN
INTRAMUSCULAR | Status: DC | PRN
  Administered 2014-07-10 (×2): 25 ug via INTRAVENOUS

## 2014-07-10 MED ORDER — MIDAZOLAM HCL 10 MG/2ML IJ SOLN
INTRAMUSCULAR | Status: AC
  Filled 2014-07-10: qty 2

## 2014-07-10 NOTE — Brief Op Note (Signed)
Four medium-large colon polyps removed. No mass seen. No further GI workup at this time. F/U on path as an outpt. Will advance diet. See  endopro note for details.

## 2014-07-10 NOTE — Progress Notes (Signed)
[facility] PROGRESS NOTE  Molly [name] GMW:[id number] DOB: 09-15-1923 DOA: 07/06/2014 PCP: Antony Blackbird, MD  Assessment/Plan  GI bleed and now with diarrhea, hgb drifted down but improved after blood transfusion 8/28 -  Appreciate GI assistance -  EGD was unremarkable on 8/27 -  Colo:  4 large sessile polyps removed which were likely responsible for bleeding, plus hemorrhoids and diverticulosis -  Continue PPI -  Advance diet as tolerated -  C. Diff negative -  Repeat hgb in AM  NSTEMI due to demand ischemia per cardiology.  Low suspicion for NSTEMI type 1.  -  Troponins peaked at 1.5 on 8/26 -  Telemetry:  NSR x 72 hours, okay to d/c -  Continue BB -  Continue daily ASA  -  Appreciate Cardiology assistance -  A1c and lipid panel as below  Episodic presyncope, palpitations, and dizziness - sounds orthostatic.  May be related to diabetic dysautonomia and progressive anemia +/- dehydration -  ECHO:  Mild LVH, EF 60-65%, normal wall motion with grade 1 DD, mild MR -  Tele:  NSR x 72 hours -  Transfusion 8/28 -  Thigh high TED hose -  Encouraged staying hydrated  Hypertension - hypertensive but improving -  Continue norvasc/ARB, atenolol -  Continue bidil  Diabetes mellitus with peripheral neuropathy, uncontrolled, A1c 9.1, CBG well controlled since admission.  Asking about peripheral neuropathy medication.   -  Continue SSI -  Most medications for peripheral neuropathy have significant side effects, including lightheadness/balance problems.  Will defer starting any medications at this time.   Hypothyroidism, TSH 1.43, continue synthroid  Metastatic breast cancer, stable, continue anastrazole   CLL with chronic stable leukocytosis, appreciate Oncology assistance  Mild hyponatremia, chronic, likely reset osmostat, stable   Leukocytosis due to CLL, chronic  Anemia of chronic disease, malignancy, and acute blood loss due to GIB -  Transfused 1 unit PRBC 8/28 -   Start hemocyte.  If she does not tolerate hemocyte, will give iron infusion prior to discharge  DIET:  CLD and advance as tolerated Access:  PIV IVF:  off Proph:  SCD  Code Status: Full code.  Family Communication: Patient's daughter at the bedside.  Disposition Plan:   Awaiting further work up for anemia.  PT/OT recommending HH PT  Consultants:  [facility], Dr. Oletta Lamas  Cardiology, followed by Dr. Mare Ferrari  Procedures:  ECHO  EGD on 8/27  Antibiotics:  none   HPI/Subjective:  Denies nausea, vomiting.  Has some abdominal discomfort post procedure.  Hungry.  Denies CP, SOB.    Objective: Filed Vitals:   07/10/14 1145 07/10/14 1150 07/10/14 1155 07/10/14 1234  BP: 191/47 189/55 172/45 155/44  Pulse: 59 60 57 61  Temp:    97.7 F (36.5 C)  TempSrc:    Oral  Resp: 20 22 20 20   Height:      Weight:      SpO2: 100% 100% 100% 92%    Intake/Output Summary (Last 24 hours) at 07/10/14 1249 Last data filed at 07/10/14 0700  Gross per 24 hour  Intake 214.67 ml  Output      0 ml  Net 214.67 ml   Filed Weights   07/06/14 2245  Weight: 90.765 kg (200 lb 1.6 oz)    Exam:   General:  WF, No acute distress  HEENT:  NCAT, MMM  Cardiovascular:  RRR, nl S1, S2 3/6 left sternal border, 2+ pulses, warm extremities  Respiratory:  CTAB, no increased WOB  Abdomen:   NABS, soft, NT/ND  MSK:   Normal tone and bulk, no LEE  Neuro:  Grossly intact  Data Reviewed: Basic Metabolic Panel:  Recent Labs Lab 07/06/14 1747 07/07/14 0410 07/08/14 0425 07/09/14 0450 07/10/14 0430  NA 133* 133* 132* 131* 132*  K 3.8 4.3 4.0 4.0 3.7  CL 99 97 100 99 100  CO2  --  21 20 20 20   GLUCOSE 221* 201* 169* 156* 145*  BUN 11 10 9 10 8   CREATININE 0.60 0.63 0.76 0.92 0.84  CALCIUM  --  9.1 8.7 8.7 8.6   Liver Function Tests:  Recent Labs Lab 07/07/14 0410  AST 16  ALT 14  ALKPHOS 99  BILITOT 0.3  PROT 6.2  ALBUMIN 3.1*    Recent Labs Lab 07/07/14 0230  LIPASE  23   No results found for this basename: AMMONIA,  in the last 168 hours CBC:  Recent Labs Lab 07/06/14 1449  07/06/14 2030 07/07/14 0047 07/07/14 0410 07/07/14 1406 07/08/14 0425 07/09/14 0450 07/10/14 0430  WBC 26.0*  < > 29.4* 27.0* 26.9*  --  24.7* 27.5* 23.9*  NEUTROABS 12.4*  --  15.0*  --   --   --   --   --   --   HGB 8.8*  < > 9.0* 8.6* 8.6* 8.3* 8.1* 7.6* 8.3*  HCT 29.3*  < > 30.1* 28.2* 27.2* 27.0* 27.1* 24.4* 26.7*  MCV 75.5*  < > 77.8* 76.0* 74.5*  --  77.4* 76.0* 78.3  PLT 469*  < > 497* 460* 479*  --  425* 412* 365  < > = values in this interval not displayed. Cardiac Enzymes:  Recent Labs Lab 07/07/14 0047 07/07/14 0230 07/07/14 0830 07/07/14 1407 07/07/14 2107  TROPONINI 0.42* 0.70* 1.50* 1.05* 0.84*   BNP (last 3 results) No results found for this basename: PROBNP,  in the last 8760 hours CBG:  Recent Labs Lab 07/09/14 0014 07/09/14 0354 07/09/14 0737 07/09/14 1648 07/09/14 2144  GLUCAP 141* 152* 164* 175* 128*    Recent Results (from the past 240 hour(s))  CLOSTRIDIUM DIFFICILE BY PCR     Status: None   Collection Time    07/09/14  9:03 AM      Result Value Ref Range Status   C difficile by pcr NEGATIVE  NEGATIVE Final   Comment: Performed at [facility]     Studies: No results found.  Scheduled Meds: . amLODipine  10 mg Oral Daily  . anastrozole  1 mg Oral Daily  . aspirin EC  81 mg Oral Daily  . atenolol  50 mg Oral BID  . calcium-vitamin D  1 tablet Oral Q1200  . cholecalciferol  1,000 Units Oral Daily  . ezetimibe  10 mg Oral Daily  . ferrous fumarate  1 tablet Oral BID  . insulin aspart  0-9 Units Subcutaneous TID WC  . irbesartan  300 mg Oral Daily  . isosorbide-hydrALAZINE  1 tablet Oral TID  . levothyroxine  75 mcg Oral QAC breakfast  . pantoprazole  40 mg Oral Daily  . potassium chloride  10 mEq Oral Daily  . sodium chloride  3 mL Intravenous Q12H  . vitamin B-12  1,000 mcg Oral Daily   Continuous  Infusions:    Principal Problem:   GI bleed Active Problems:   Breast cancer metastasized to bone   Type 2 diabetes mellitus with peripheral neuropathy   Anemia, blood loss   Syncope   HTN (hypertension)  NSTEMI (non-ST elevated myocardial infarction)    Time spent: 30 min    Overton Boggus, [place] [facility] Pager [phone]. If 7PM-7AM, please contact night-coverage at [email or web address], password [facility] 07/10/2014, 12:49 PM  LOS: 4 days

## 2014-07-10 NOTE — Interval H&P Note (Signed)
History and Physical Interval Note:  07/10/2014 10:46 AM  Molly [name]  has presented today for surgery, with the diagnosis of +stool  The various methods of treatment have been discussed with the patient and family. After consideration of risks, benefits and other options for treatment, the patient has consented to  Procedure(s): COLONOSCOPY (N/A) as a surgical intervention .  The patient's history has been reviewed, patient examined, no change in status, stable for surgery.  I have reviewed the patient's chart and labs.  Questions were answered to the patient's satisfaction.     [place] [facility].

## 2014-07-10 NOTE — Discharge Summary (Signed)
Physician Discharge Summary  Molly [name] NIO:[id number] DOB: 1923-06-02 DOA: 07/06/2014  PCP: Antony Blackbird, MD  Admit date: 07/06/2014 Discharge date: 07/11/2014  Recommendations for Outpatient Follow-up:  1. GI in 2 weeks for f/u pathology from biopsied colon polyps, repeat H&H 2. Cardiology follow up as needed for chest pain 3. Dr. Benay Spice in 2-4 weeks to review blood counts/ongoing management of cancer  Discharge Diagnoses:  Principal Problem:   GI bleed due to colon polyps Active Problems:   Breast cancer metastasized to bone   Type 2 diabetes mellitus with peripheral neuropathy   Anemia, blood loss   Syncope   HTN (hypertension)   NSTEMI (non-ST elevated myocardial infarction), type 2, demand ischemia   Discharge Condition: stable, improved  Diet recommendation: diabetic  Wt Readings from Last 3 Encounters:  07/06/14 90.765 kg (200 lb 1.6 oz)  07/06/14 90.765 kg (200 lb 1.6 oz)  07/06/14 90.765 kg (200 lb 1.6 oz)    History of present illness:  Molly [name] is an 80 y.o. female with history of metastatic breast cancer, diabetes mellitus, hypertension, hypothyroidism, CLL was referred to the ER after she had a presyncopal episode while visiting her oncologist. Patient reported that for 2 weeks prior to admission, she had been having dizzy spells when she stood up or exerted herself. Blood work done demonstrated worsening anemia and she was referred to the ER.   In the ER patient's hemoglobin was found to be around 8 and patient's hemoglobin is usually around 10. Stool for occult blood was positive.   Hospital Course:  GI bleed and diarrhea, hgb drifted down but improved after blood transfusion 8/28.  C. Diff was negative.  She was seen by GI who performed EGD on 8/27 which was unremarkable.  She had follow up colonoscopy on 8/29 which demonstrated 4 large sessile polyps removed which were likely responsible for bleeding, plus hemorrhoids and diverticulosis.  Polyps  were removed and she did not have further bleeding and her hemoglobin remained stable.     NSTEMI type 2 (demand ischemia).  Due to presyncope, troponins were cycled.  She had a peak troponin of 1.5 on 8/26.  Cardiology was consulted who felt her elevated troponin was due to demand ischemia per cardiology. Low suspicion for NSTEMI type 1.  Telemetry: NSR x 72 hours.  She continued beta blocker and aspirin.  Statin was started but then discontinued and can be started as outpatient under supervision of PCP.  ECHO: Mild LVH, EF 60-65%, normal wall motion with grade 1 DD, mild MR.  She also needs ongoing diabetes management.    Episodic presyncope, palpitations, and dizziness - sounds orthostatic but given sudden onset and quick resolution, she may also have some dysautonomia from diabetes or BPPV. May be related to diabetic dysautonomia and progressive anemia +/- dehydration.  She was placed in thigh high TED hose.  Tele: NSR x 72 hours.  She had blood transfusion on 8/28 and was encouraged to take iron supplements and stay hydrated.    Hypertension - hypertensive.  She continued norvasc/ARB, atenolol and bidil was added.    Diabetes mellitus with peripheral neuropathy, uncontrolled, A1c 9.1, CBG well controlled since admission on SSI.  She may resume her home diabetes medications, but she needs close follow upw with her PCP for adjustments to her regimen since her A1c is above goal.  She asked about peripheral neuropathy medication but since most medications for peripheral neuropathy have significant side effects, including lightheadness/balance problems, I recommended against  starting any new medications at this time.  Hypothyroidism, TSH 1.43, continued synthroid  Metastatic breast cancer, stable, continued anastrazole  CLL with chronic stable leukocytosis, appreciate Oncology assistance  Mild hyponatremia, chronic, likely reset osmostat, stable  Leukocytosis due to CLL, chronic  Anemia of chronic  disease, malignancy, and acute blood loss due to GIB.  Transfused 1 unit PRBC on 8/28.  Trial of hemocyte. If she does not tolerate hemocyte, she will start iron infusions at Dr. Gearldine Shown office.     Consultants:  [facility], Dr. Oletta Lamas  Cardiology, followed by Dr. Mare Ferrari Dr. Benay Spice, Oncology Procedures:  ECHO  EGD on 8/27 Colonoscopy on 8/29 Antibiotics:  none    Discharge Exam: Filed Vitals:   07/11/14 0624  BP: 179/59  Pulse: 60  Temp: 98.2 F (36.8 C)  Resp: 18   Filed Vitals:   07/10/14 1155 07/10/14 1234 07/10/14 2211 07/11/14 0624  BP: 172/45 155/44 155/62 179/59  Pulse: 57 61 61 60  Temp:  97.7 F (36.5 C) 97.6 F (36.4 C) 98.2 F (36.8 C)  TempSrc:  Oral Oral Oral  Resp: 20 20 20 18   Height:      Weight:      SpO2: 100% 92% 97% 93%    General: WF, No acute distress, sitting up in bed HEENT: NCAT, MMM, PERRL Cardiovascular: RRR, nl S1, S2 3/6 left sternal border, 2+ pulses, warm extremities  Respiratory: CTAB, no increased WOB  Abdomen: NABS, soft, NT/ND  MSK: Normal tone and bulk, no LEE  Neuro: Grossly intact   Discharge Instructions      Discharge Instructions   (HEART FAILURE PATIENTS) Call MD:  Anytime you have any of the following symptoms: 1) 3 pound weight gain in 24 hours or 5 pounds in 1 week 2) shortness of breath, with or without a dry hacking cough 3) swelling in the hands, feet or stomach 4) if you have to sleep on extra pillows at night in order to breathe.    Complete by:  As directed      Call MD for:  difficulty breathing, headache or visual disturbances    Complete by:  As directed      Call MD for:  hives    Complete by:  As directed      Call MD for:  persistant dizziness or light-headedness    Complete by:  As directed      Call MD for:  persistant nausea and vomiting    Complete by:  As directed      Call MD for:  severe uncontrolled pain    Complete by:  As directed      Call MD for:  temperature >100.4    Complete  by:  As directed      Diet Carb Modified    Complete by:  As directed      Discharge instructions    Complete by:  As directed   You were hospitalized with dizzy/fainting spells which may have been due to anemia, dehydration, or neuropathy from your diabetes.  You have been transfused blood.  Please drink at least 40 ounces of fluid per day if possible.  Try wearing TED hose during the day.  Please take hemocyte (iron supplement) to boost your blood counts, however, if you have nausea, talk to Dr. Benay Spice about alternatives.  Your stools will become darker while taking iron supplements.  Use miralax daily to help prevent constipation from iron.  You can adjust the dose.  Try mixing  in diet sprite or ginger ale.  I have adjusted the dose of your atenolol and your metformin and have added one new blood pressure medication called bidil (combination of hydralazine-isosorbide).  Please review these changes with your primary care doctor.     Increase activity slowly    Complete by:  As directed             Medication List    STOP taking these medications       ciprofloxacin 500 MG tablet  Commonly known as:  CIPRO     metroNIDAZOLE 500 MG tablet  Commonly known as:  FLAGYL      TAKE these medications       ACCU-CHEK FASTCLIX LANCETS Misc     amLODipine-valsartan 10-320 MG per tablet  Commonly known as:  EXFORGE  Take 1 tablet by mouth daily.     anastrozole 1 MG tablet  Commonly known as:  ARIMIDEX  Take 1 mg by mouth daily.     aspirin EC 81 MG tablet  Take 81 mg by mouth daily.     atenolol 50 MG tablet  Commonly known as:  TENORMIN  Take 1 tablet (50 mg total) by mouth 2 (two) times daily.     CALCIUM + D PO  Take by mouth daily after lunch.     cholecalciferol 1000 UNITS tablet  Commonly known as:  VITAMIN D  Take 1,000 Units by mouth daily.     ferrous fumarate 325 (106 FE) MG Tabs tablet  Commonly known as:  HEMOCYTE - 106 mg FE  Take 1 tablet (106 mg of iron  total) by mouth 2 (two) times daily.     fluticasone 50 MCG/ACT nasal spray  Commonly known as:  FLONASE  Place 2 sprays into the nose daily as needed for allergies or rhinitis.     FREESTYLE LITE TEST VI  3 (three) times daily as needed (blood sugar monitoring.).     glimepiride 4 MG tablet  Commonly known as:  AMARYL  Take 4 mg by mouth daily with lunch.     isosorbide-hydrALAZINE 20-37.5 MG per tablet  Commonly known as:  BIDIL  Take 1 tablet by mouth 3 (three) times daily.     levothyroxine 75 MCG tablet  Commonly known as:  SYNTHROID, LEVOTHROID  Take 75 mcg by mouth daily.     metFORMIN 850 MG tablet  Commonly known as:  GLUCOPHAGE  Take 1 tablet (850 mg total) by mouth 2 (two) times daily. With lunch and dinner.     pantoprazole 40 MG tablet  Commonly known as:  PROTONIX  Take 40 mg by mouth daily as needed (acid).     potassium chloride 10 MEQ tablet  Commonly known as:  K-DUR  Take 10 mEq by mouth daily.     VITAMIN B-12 PO  Take by mouth daily.     ZETIA 10 MG tablet  Generic drug:  ezetimibe  Take 10 mg by mouth Daily.       Follow-up Information   Follow up with FULP, CAMMIE, MD. Schedule an appointment as soon as possible for a visit in 2 weeks.   Specialty:  Family Medicine   Contact information:   [place] [address] [phone]       Follow up with EDWARDS JR,JAMES L, MD. Schedule an appointment as soon as possible for a visit in 2 weeks.   Specialty:  Gastroenterology   Contact information:  [place] [address] [phone]       Follow up with Betsy Coder, MD In 2 weeks.   Specialty:  Oncology   Contact information:   [place] [address] [phone]       The results of significant diagnostics from this hospitalization (including imaging, microbiology, ancillary and laboratory) are listed below for reference.     Significant Diagnostic Studies: Dg Chest 2 View  07/06/2014   CLINICAL DATA:  Dizziness.  Nausea.  Hypertension.  Diabetic.  EXAM: CHEST  2 VIEW  COMPARISON:  09/2010  FINDINGS: Apical lordotic portable view. Numerous leads and wires project over the chest. Normal heart size. No pleural effusion or pneumothorax. Low lung volumes with resultant pulmonary interstitial prominence. Clear lungs.  IMPRESSION: No acute cardiopulmonary disease.   Electronically Signed   By: Abigail Miyamoto M.D.   On: 07/06/2014 19:06    Microbiology: Recent Results (from the past 240 hour(s))  CLOSTRIDIUM DIFFICILE BY PCR     Status: None   Collection Time    07/09/14  9:03 AM      Result Value Ref Range Status   C difficile by pcr NEGATIVE  NEGATIVE Final   Comment: Performed at [facility]: Basic Metabolic Panel:  Recent Labs Lab 07/07/14 0410 07/08/14 0425 07/09/14 0450 07/10/14 0430 07/11/14 0455  NA 133* 132* 131* 132* 136*  K 4.3 4.0 4.0 3.7 4.0  CL 97 100 99 100 103  CO2 21 20 20 20 21   GLUCOSE 201* 169* 156* 145* 158*  BUN 10 9 10 8 6   CREATININE 0.63 0.76 0.92 0.84 0.86  CALCIUM 9.1 8.7 8.7 8.6 8.6   Liver Function Tests:  Recent Labs Lab 07/07/14 0410  AST 16  ALT 14  ALKPHOS 99  BILITOT 0.3  PROT 6.2  ALBUMIN 3.1*    Recent Labs Lab 07/07/14 0230  LIPASE 23   No results found for this basename: AMMONIA,  in the last 168 hours CBC:  Recent Labs Lab 07/06/14 1449  07/06/14 2030  07/07/14 0410 07/07/14 1406 07/08/14 0425 07/09/14 0450 07/10/14 0430 07/11/14 0455  WBC 26.0*  --  29.4*  < > 26.9*  --  24.7* 27.5* 23.9* 21.7*  NEUTROABS 12.4*  --  15.0*  --   --   --   --   --   --   --   HGB 8.8*  < > 9.0*  < > 8.6* 8.3* 8.1* 7.6* 8.3* 9.1*  HCT 29.3*  < > 30.1*  < > 27.2* 27.0* 27.1* 24.4* 26.7* 28.7*  MCV 75.5*  --  77.8*  < > 74.5*  --  77.4* 76.0* 78.3 77.4*  PLT 469*  --  497*  < > 479*  --  425* 412* 365 379  < > = values in this interval  not displayed. Cardiac Enzymes:  Recent Labs Lab 07/07/14 0047 07/07/14 0230 07/07/14 0830 07/07/14 1407 07/07/14 2107  TROPONINI 0.42* 0.70* 1.50* 1.05* 0.84*   BNP: BNP (last 3 results) No results found for this basename: PROBNP,  in the last 8760 hours CBG:  Recent Labs Lab 07/10/14 0738 07/10/14 1234 07/10/14 1644 07/10/14 2334 07/11/14 0753  GLUCAP 160* 126* 131* 135* 163*    Time coordinating discharge: 35 minutes  Signed:  Danel Requena  [facility] 07/11/2014, 10:58 AM

## 2014-07-10 NOTE — H&P (View-Only) (Signed)
[facility] PROGRESS NOTE Subjective patient has not had any gross bleeding but stools are positive for FOB.  Objective: Vital signs in last 24 hours: Temp:  [98.2 F (36.8 C)-98.4 F (36.9 C)] 98.2 F (36.8 C) (08/28 0530) Pulse Rate:  [60-71] 60 (08/28 0530) Resp:  [15-22] 18 (08/28 0530) BP: (133-204)/(41-109) 137/41 mmHg (08/28 0530) SpO2:  [93 %-99 %] 95 % (08/28 0530) Last BM Date: 07/09/14  Intake/Output from previous day: 08/27 0701 - 08/28 0700 In: 597.5 [I.V.:597.5] Out: 900 [Urine:900] Intake/Output this shift:    PE:   Abdomen-- soft and nontender  Lab Results:  Recent Labs  07/06/14 2030 07/07/14 0047 07/07/14 0410 07/07/14 1406 07/08/14 0425 07/09/14 0450  WBC 29.4* 27.0* 26.9*  --  24.7* 27.5*  HGB 9.0* 8.6* 8.6* 8.3* 8.1* 7.6*  HCT 30.1* 28.2* 27.2* 27.0* 27.1* 24.4*  PLT 497* 460* 479*  --  425* 412*   BMET  Recent Labs  07/06/14 1747 07/07/14 0410 07/08/14 0425 07/09/14 0450  NA 133* 133* 132* 131*  K 3.8 4.3 4.0 4.0  CL 99 97 100 99  CO2  --  21 20 20   CREATININE 0.60 0.63 0.76 0.92   LFT  Recent Labs  07/07/14 0410  PROT 6.2  AST 16  ALT 14  ALKPHOS 99  BILITOT 0.3   PT/INR  Recent Labs  07/07/14 1406  LABPROT 15.2  INR 1.20   PANCREAS  Recent Labs  07/07/14 0230  LIPASE 23         Studies/Results: No results found.  Medications: I have reviewed the patient's current medications.  Assessment/Plan: 1. Iron deficiency anemia/heme positive stool. Long discussion with the patient. It is possible she has AVMs throughout her G.I. tract. Her EGD yesterday was nonrevealing. Long discussion with she and her family about colonoscopy. She refuses colonoscopy at this time because she does not think she can tolerate the prep. She states that she will consent to a barium enema. I have explained that if an abnormality is found on barium enema she may need colonoscopy as well and she does  understand.   Jerri Glauser JR,Shanise Balch L 07/09/2014, 8:55 AM

## 2014-07-10 NOTE — Op Note (Signed)
[facility] [place], [address]   COLONOSCOPY PROCEDURE REPORT  PATIENT: Molly [name], Molly [name]  MR#: [id number] BIRTHDATE: 12-Apr-1923 , 89  yrs. old GENDER: Female ENDOSCOPIST: Wilford Corner, MD REFERRED VO:[id number] team PROCEDURE DATE:  07/10/2014 PROCEDURE:   Colonoscopy with snare polypectomy ASA CLASS:   Class III INDICATIONS:Iron Deficiency Anemia and heme-positive stool. MEDICATIONS: Fentanyl 50 mcg IV and Versed 4 mg IV  DESCRIPTION OF PROCEDURE:   After the risks benefits and alternatives of the procedure were thoroughly explained, informed consent was obtained.  The     endoscope was introduced through the anus and advanced to the cecum, which was identified by both the appendix and ileocecal valve , limited by No adverse events experienced.   The quality of the prep was good. .  The instrument was then slowly withdrawn as the colon was fully examined.     FINDINGS:  Rectal exam unremarkable.  Pediatric colonoscope inserted into the colon and advanced to the cecum, where the appendiceal orifice and ileocecal valve were identified. The terminal ileum was intubated and was normal in appearance.   On careful withdrawal of the colonoscope, a 2 cm sessile polyp was seen in the cecum and removed in a piecemeal fashion with a hot snare. A small amount of oozing of blood occurred from part of the polypectomy site and a hemoclip was placed with hemostasis noted. A 1.2 cm sessile polyp was removed with snare cautery from the proximal ascending colon. A 5 mm sessile polyp was removed with snare cautery from the proximal ascending colon. Extensive diverticulae were seen in the sigmoid colon. A 1.5 cm sessile polyp was removed with snare cautery from the distal sigmoid colon.   Retroflexion revealed medium-sized internal hemorrhoids. No active bleeding was seen during insertion of the colonoscope.  COMPLICATIONS: None  IMPRESSION:     1. Colon  polyps X 4 - s/p snare cautery (see above for details) 2. Sigmoid diverticulosis 3. Internal hemorrhoids 4. Large polyps could have contributed to the anemia   RECOMMENDATIONS: F/U on path; Advance diet    ______________________________ eSignedWilford Corner, MD 07/10/2014 12:13 PM   CC:  PATIENT NAME:  [name], [name] MR#: [id number]

## 2014-07-11 DIAGNOSIS — D126 Benign neoplasm of colon, unspecified: Principal | ICD-10-CM

## 2014-07-11 LAB — CBC
HCT: 28.7 % — ABNORMAL LOW (ref 36.0–46.0)
Hemoglobin: 9.1 g/dL — ABNORMAL LOW (ref 12.0–15.0)
MCH: 24.5 pg — ABNORMAL LOW (ref 26.0–34.0)
MCHC: 31.7 g/dL (ref 30.0–36.0)
MCV: 77.4 fL — ABNORMAL LOW (ref 78.0–100.0)
Platelets: 379 10*3/uL (ref 150–400)
RBC: 3.71 MIL/uL — AB (ref 3.87–5.11)
RDW: 15.9 % — ABNORMAL HIGH (ref 11.5–15.5)
WBC: 21.7 10*3/uL — ABNORMAL HIGH (ref 4.0–10.5)

## 2014-07-11 LAB — BASIC METABOLIC PANEL
ANION GAP: 12 (ref 5–15)
BUN: 6 mg/dL (ref 6–23)
CO2: 21 mEq/L (ref 19–32)
CREATININE: 0.86 mg/dL (ref 0.50–1.10)
Calcium: 8.6 mg/dL (ref 8.4–10.5)
Chloride: 103 mEq/L (ref 96–112)
GFR calc non Af Amer: 59 mL/min — ABNORMAL LOW (ref >90)
GFR, EST AFRICAN AMERICAN: 68 mL/min — AB (ref >90)
Glucose, Bld: 158 mg/dL — ABNORMAL HIGH (ref 70–99)
Potassium: 4 mEq/L (ref 3.7–5.3)
SODIUM: 136 meq/L — AB (ref 137–147)

## 2014-07-11 LAB — GLUCOSE, CAPILLARY: Glucose-Capillary: 163 mg/dL — ABNORMAL HIGH (ref 70–99)

## 2014-07-11 MED ORDER — METFORMIN HCL 850 MG PO TABS: 850.0000 mg | ORAL_TABLET | Freq: Two times a day (BID) | ORAL | Status: DC

## 2014-07-11 MED ORDER — FERROUS FUMARATE 325 (106 FE) MG PO TABS: 1.0000 | ORAL_TABLET | Freq: Two times a day (BID) | ORAL | Status: DC

## 2014-07-11 MED ORDER — ISOSORB DINITRATE-HYDRALAZINE 20-37.5 MG PO TABS: 1.0000 | ORAL_TABLET | Freq: Three times a day (TID) | ORAL | Status: DC

## 2014-07-11 MED ORDER — ATENOLOL 50 MG PO TABS: 50.0000 mg | ORAL_TABLET | Freq: Two times a day (BID) | ORAL | Status: DC

## 2014-07-11 NOTE — Progress Notes (Signed)
Patient ID: Molly [name], female   DOB: [birth year], 80 y.o.   MRN: [id number]  Patient doing well. Tolerating diet. No bleeding. Sitting up on side of bed. Daughter at bedside. Being discharged today. Our office will call tomorrow to arrange f/u and path results will be communicated to the patient when they are complete.

## 2014-07-12 ENCOUNTER — Telehealth: Payer: Self-pay | Admitting: Oncology

## 2014-07-12 ENCOUNTER — Encounter (HOSPITAL_COMMUNITY): Payer: Self-pay | Admitting: Gastroenterology

## 2014-07-12 NOTE — Telephone Encounter (Signed)
Per [name] cancelled labs 09/04 per GBS, pt was in hospital and hemoglobin count was up and that she could cancel 09/04 labs and come back for labs/ov next on sch...KJ

## 2014-07-13 ENCOUNTER — Other Ambulatory Visit: Payer: Self-pay | Admitting: Cardiology

## 2014-07-16 ENCOUNTER — Other Ambulatory Visit: Payer: PRIVATE HEALTH INSURANCE

## 2014-08-02 ENCOUNTER — Telehealth: Payer: Self-pay | Admitting: Oncology

## 2014-08-02 ENCOUNTER — Other Ambulatory Visit (HOSPITAL_BASED_OUTPATIENT_CLINIC_OR_DEPARTMENT_OTHER): Payer: PRIVATE HEALTH INSURANCE

## 2014-08-02 ENCOUNTER — Ambulatory Visit (HOSPITAL_BASED_OUTPATIENT_CLINIC_OR_DEPARTMENT_OTHER): Payer: Medicare Other | Admitting: Oncology

## 2014-08-02 VITALS — BP 176/58 | HR 68 | Temp 97.9°F | Resp 20 | Ht 65.0 in | Wt 198.3 lb

## 2014-08-02 DIAGNOSIS — D509 Iron deficiency anemia, unspecified: Secondary | ICD-10-CM

## 2014-08-02 DIAGNOSIS — C7952 Secondary malignant neoplasm of bone marrow: Secondary | ICD-10-CM

## 2014-08-02 DIAGNOSIS — I10 Essential (primary) hypertension: Secondary | ICD-10-CM

## 2014-08-02 DIAGNOSIS — C50919 Malignant neoplasm of unspecified site of unspecified female breast: Secondary | ICD-10-CM

## 2014-08-02 DIAGNOSIS — D649 Anemia, unspecified: Secondary | ICD-10-CM

## 2014-08-02 DIAGNOSIS — R1031 Right lower quadrant pain: Secondary | ICD-10-CM

## 2014-08-02 DIAGNOSIS — C7951 Secondary malignant neoplasm of bone: Secondary | ICD-10-CM

## 2014-08-02 DIAGNOSIS — E119 Type 2 diabetes mellitus without complications: Secondary | ICD-10-CM

## 2014-08-02 DIAGNOSIS — R61 Generalized hyperhidrosis: Secondary | ICD-10-CM

## 2014-08-02 LAB — CBC WITH DIFFERENTIAL/PLATELET
BASO%: 0.2 % (ref 0.0–2.0)
BASOS ABS: 0 10*3/uL (ref 0.0–0.1)
EOS ABS: 0.3 10*3/uL (ref 0.0–0.5)
EOS%: 1.1 % (ref 0.0–7.0)
HCT: 35.1 % (ref 34.8–46.6)
HEMOGLOBIN: 11.1 g/dL — AB (ref 11.6–15.9)
LYMPH%: 51 % — ABNORMAL HIGH (ref 14.0–49.7)
MCH: 25.3 pg (ref 25.1–34.0)
MCHC: 31.6 g/dL (ref 31.5–36.0)
MCV: 80 fL (ref 79.5–101.0)
MONO#: 0.9 10*3/uL (ref 0.1–0.9)
MONO%: 4.1 % (ref 0.0–14.0)
NEUT%: 43.6 % (ref 38.4–76.8)
NEUTROS ABS: 10 10*3/uL — AB (ref 1.5–6.5)
Platelets: 418 10*3/uL — ABNORMAL HIGH (ref 145–400)
RBC: 4.39 10*6/uL (ref 3.70–5.45)
RDW: 19.3 % — AB (ref 11.2–14.5)
WBC: 23 10*3/uL — ABNORMAL HIGH (ref 3.9–10.3)
lymph#: 11.7 10*3/uL — ABNORMAL HIGH (ref 0.9–3.3)
nRBC: 0 % (ref 0–0)

## 2014-08-02 LAB — TECHNOLOGIST REVIEW

## 2014-08-02 NOTE — Progress Notes (Signed)
  [facility] OFFICE PROGRESS NOTE   Diagnosis: Breast cancer, anemia  INTERVAL HISTORY:   Ms. Fromm returns as scheduled. She was admitted after a presyncope event in the office on 07/06/2014. She was noted to have severe microcytic anemia on hospital admission. An upper endoscopy was unremarkable. Multiple polyps were removed on a colonoscopy with the pathology confirming tubular adenomas. She is taking iron.  Ms. Moyd reports no recurrent syncope. No bleeding. No new complaint.  Objective:  Vital signs in last 24 hours:  Blood pressure 176/58, pulse 68, temperature 97.9 F (36.6 C), temperature source Oral, resp. rate 20, height 5\' 5"  (1.651 m), weight 198 lb 4.8 oz (89.948 kg).    HEENT: Neck without mass Lymphatics: No cervical, supraclavicular, or axillary nodes Resp: Lungs clear bilaterally Cardio: Regular rate and rhythm GI: No hepatomegaly, nontender Vascular: No leg edema   Lab Results:  Lab Results  Component Value Date   WBC 23.0* 08/02/2014   HGB 11.1* 08/02/2014   HCT 35.1 08/02/2014   MCV 80.0 08/02/2014   PLT 418* 08/02/2014   NEUTROABS 10.0* 08/02/2014     Medications: I have reviewed the patient's current medications.  Assessment/Plan: 1. Breast cancer, right-sided breast cancer (T2 N2) diagnosed in 2009, status post a right lumpectomy and adjuvant radiation. She was diagnosed with a pathologic left femur fracture in November 2011. A staging bone scan and CT scan in November 2011 confirmed multiple bone metastases and a cystic metastasis at the left iliac muscle. No visceral metastases were seen. She began Arimidex following an office visit 11/07/2010. The Arimidex was held for approximately 1 week after an office visit on 02/09/2011 and then resumed on 02/16/2011.  a. History of elevated CA 27-29. The CA 27.29 was normal on 09/21/2013. b. Bone scan 11/04/2013 with stable multifocal areas of bone metastasis. No new or progressive disease  identified. 2. Status post a left hip hemiarthroplasty for treatment of a pathologic fracture 10/07/2010. 3. Status post radiation to the left proximal femur 12/22 through 11/17/2010. 4. Diabetes. 5. Hypertension. 6. History of arthralgias, likely related to degenerative arthritis 7. Leukocytosis/lymphocytosis-chronic. Review the peripheral blood smear from 02/01/2012 was consistent with a diagnosis of chronic lymphocytic leukemia. A peripheral blood sample was submitted for flow cytometry on 06/03/2012. This confirmed a diagnosis of chronic lymphocytic leukemia. 8. Recurrent "bladder" pain-relieved with ciprofloxacin, it is not clear the pain is related to urinary tract infection, but she insists the pain is relieved with ciprofloxacin. 9. Left abdomen/iliac pain-etiology unclear. 10. Cystic left pelvic lesion. Present on x-ray studies dating to 2002. 11. Anemia 04/13/2014. Progressive, microcytic, 07/06/2014. Status post an unremarkable upper endoscopy 07/08/2014, colonoscopy 07/10/2014 with 4  polyps-removed, tubular adenomas 12. Heme positive stool 04/13/2014. 13. Night sweats. Question related to CLL, question diabetes. 14. Admission 07/06/2014 with a pre-syncope event   Disposition:  She appears well. The anemia has improved with iron. She will continue ferrous femur rate once daily. The iron deficiency anemia was likely related to colon polyps.  She will continue Arimidex for treatment of metastatic breast cancer. We will followup on the CA 27.29 from today.  Ms. Nunley will return for an office visit and CBC in 2 months.  I recommend that she obtain an influenza vaccine.   Betsy Coder, MD  08/02/2014  5:01 PM

## 2014-08-02 NOTE — Telephone Encounter (Signed)
Pt confirmed labs/ov per 09/21 POF, gave pt AVS...Marland KitchenMarland KitchenKJ

## 2014-08-03 ENCOUNTER — Telehealth: Payer: Self-pay | Admitting: *Deleted

## 2014-08-03 LAB — CANCER ANTIGEN 27.29: CA 27.29: 27 U/mL (ref 0–39)

## 2014-08-03 LAB — FERRITIN CHCC: Ferritin: 21 ng/ml (ref 9–269)

## 2014-08-03 NOTE — Telephone Encounter (Signed)
Called pt with lab results. Tumor marker is normal, ferritin is better- per Dr. Benay Spice. She voiced appreciation for call.

## 2014-08-03 NOTE — Telephone Encounter (Signed)
Message copied by Brien Few on Tue Aug 03, 2014  5:25 PM ------      Message from: Ladell Pier      Created: Tue Aug 03, 2014  1:44 PM       Please call patient, tumor marker is normal, ferritin is better ------

## 2014-08-11 ENCOUNTER — Other Ambulatory Visit: Payer: Self-pay | Admitting: *Deleted

## 2014-08-11 MED ORDER — FERROUS FUMARATE 325 (106 FE) MG PO TABS: 1.0000 | ORAL_TABLET | Freq: Two times a day (BID) | ORAL | Status: DC

## 2014-09-07 ENCOUNTER — Encounter: Payer: Self-pay | Admitting: Cardiology

## 2014-09-07 ENCOUNTER — Ambulatory Visit (INDEPENDENT_AMBULATORY_CARE_PROVIDER_SITE_OTHER): Payer: PRIVATE HEALTH INSURANCE | Admitting: Cardiology

## 2014-09-07 VITALS — BP 142/80 | HR 70 | Ht 65.0 in | Wt 198.0 lb

## 2014-09-07 DIAGNOSIS — I214 Non-ST elevation (NSTEMI) myocardial infarction: Secondary | ICD-10-CM

## 2014-09-07 DIAGNOSIS — K922 Gastrointestinal hemorrhage, unspecified: Secondary | ICD-10-CM

## 2014-09-07 DIAGNOSIS — R0989 Other specified symptoms and signs involving the circulatory and respiratory systems: Secondary | ICD-10-CM

## 2014-09-07 DIAGNOSIS — I10 Essential (primary) hypertension: Secondary | ICD-10-CM

## 2014-09-07 DIAGNOSIS — I350 Nonrheumatic aortic (valve) stenosis: Secondary | ICD-10-CM

## 2014-09-07 DIAGNOSIS — I119 Hypertensive heart disease without heart failure: Secondary | ICD-10-CM

## 2014-09-07 MED ORDER — POTASSIUM CHLORIDE CRYS ER 10 MEQ PO TBCR: EXTENDED_RELEASE_TABLET | ORAL | Status: DC

## 2014-09-07 MED ORDER — ATENOLOL 50 MG PO TABS: 50.0000 mg | ORAL_TABLET | Freq: Two times a day (BID) | ORAL | Status: DC

## 2014-09-07 MED ORDER — EZETIMIBE 10 MG PO TABS: 10.0000 mg | ORAL_TABLET | Freq: Every day | ORAL | Status: DC

## 2014-09-07 MED ORDER — AMLODIPINE BESYLATE 10 MG PO TABS: 10.0000 mg | ORAL_TABLET | Freq: Every day | ORAL | Status: DC

## 2014-09-07 MED ORDER — VALSARTAN 320 MG PO TABS: 320.0000 mg | ORAL_TABLET | Freq: Every day | ORAL | Status: DC

## 2014-09-07 NOTE — Patient Instructions (Signed)
STOP THE EXFORGE   START VALSARTAN (DIOVAN) 320 MG DAILY  START AMLODIPINE 10 MG DAILY  Your physician wants you to follow-up in: [place] will receive a reminder letter in the mail two months in advance. If you don't receive a letter, please call our office to schedule the follow-up appointment.

## 2014-09-07 NOTE — Assessment & Plan Note (Signed)
The patient has not been experiencing any exertional chest pain or angina

## 2014-09-07 NOTE — Progress Notes (Signed)
Molly [name] Date of Birth:  12/14/1922 [facility] [address] [place], [place]  [address] [phone]        Fax   [phone]   History of Present Illness: This 80 year old woman is seen for a scheduled followup office visit. She has a past history of essential hypertension and hypercholesterolemia. She also has a history of aortic valve sclerosis. She has had a history of arrhythmia with PVCs. She has a history of breast cancer with metastasis to bone and she had a previous pathologic fracture of her left hip. He does not have any history of ischemic heart disease and she had a normal nuclear stress test in 2009. She was told many years ago by her former PCP Dr. Conley Canal that she had a small abdominal aortic aneurysm. She has a history of diabetes and is followed by Dr. Buddy Duty.  Since last visit she has not had any new cardiac symptoms. She has noted some blurring of her vision which she attributes to her blood pressure being high and her blood sugar being high. Her last echocardiogram was 10/20/03 and at that time had an ejection fraction of 55-65% with LVH and with small degree of left ventricular outflow tract dynamic obstruction.  She had carotid Doppler ultrasound on 09/16/13 which showed no significant carotid artery stenosis but she did have a right thyroid nodule.  She has a known goiter.  The patient had an echocardiogram on 09/15/13 showing mild aortic stenosis with peak gradient of 19 and a mean gradient of 12 and her ejection fraction was 55-60%. Since her last saw her the patient was hospitalized for blood loss anemia while in the hospital her blood pressure was quite labile.  She was sent home on BiDil but could not tolerate it because of side effects.  In the hospital she had colonoscopy which revealed 4 colon polyps to be the source of her bleeding.  They were nonmalignant.   Current Outpatient Prescriptions  Medication Sig Dispense Refill  .  ACCU-CHEK FASTCLIX LANCETS MISC       . amoxicillin (AMOXIL) 500 MG capsule       . anastrozole (ARIMIDEX) 1 MG tablet Take 1 mg by mouth daily.      Marland Kitchen aspirin EC 81 MG tablet Take 81 mg by mouth daily.      Marland Kitchen atenolol (TENORMIN) 50 MG tablet Take 1 tablet (50 mg total) by mouth 2 (two) times daily.  60 tablet  11  . Calcium Carbonate-Vitamin D (CALCIUM + D PO) Take by mouth daily after lunch.       . cholecalciferol (VITAMIN D) 1000 UNITS tablet Take 1,000 Units by mouth daily.      . Cyanocobalamin (VITAMIN B-12 PO) Take by mouth daily.        Marland Kitchen ezetimibe (ZETIA) 10 MG tablet Take 1 tablet (10 mg total) by mouth daily.  30 tablet  11  . ferrous fumarate (HEMOCYTE - 106 MG FE) 325 (106 FE) MG TABS tablet Take 1 tablet (106 mg of iron total) by mouth 2 (two) times daily.  60 each  1  . fluticasone (FLONASE) 50 MCG/ACT nasal spray Place 2 sprays into the nose daily as needed for allergies or rhinitis.       Marland Kitchen glimepiride (AMARYL) 4 MG tablet Take 4 mg by mouth daily with lunch.      . Glucose Blood (FREESTYLE LITE TEST VI) 3 (three) times daily as needed (blood sugar monitoring.).       Marland Kitchen  levothyroxine (SYNTHROID, LEVOTHROID) 75 MCG tablet Take 75 mcg by mouth daily.        . metFORMIN (GLUCOPHAGE) 850 MG tablet Take 500 mg by mouth 2 (two) times daily. With lunch and dinner.  08/02/14 Pt states 850mg  made her vision blurry--Pt decreased back to 500 mg      . pantoprazole (PROTONIX) 40 MG tablet Take 40 mg by mouth daily as needed (acid).       . potassium chloride (K-DUR) 10 MEQ tablet Take 10 mEq by mouth daily.      . potassium chloride (KLOR-CON M10) 10 MEQ tablet TAKE 1 TABLET (10 MEQ TOTAL) BY MOUTH DAILY.  30 tablet  11  . amLODipine (NORVASC) 10 MG tablet Take 1 tablet (10 mg total) by mouth daily.  30 tablet  11  . valsartan (DIOVAN) 320 MG tablet Take 1 tablet (320 mg total) by mouth daily.  90 tablet  11   No current facility-administered medications for this visit.    Allergies    Allergen Reactions  . Codeine     Makes her feel "crazy."  . Demerol     Makes her feel "crazy."  . Librium     Makes her feel "crazy."  . Lipitor [Atorvastatin Calcium]     Makes her feel "crazy."  . Metoprolol     Reaction unknown.     Patient Active Problem List   Diagnosis Date Noted  . Aortic stenosis, mild 09/07/2014  . Diverticulosis of colon without hemorrhage 07/11/2014  . Colon polyps 07/10/2014  . GI bleed 07/07/2014  . Syncope 07/07/2014  . HTN (hypertension) 07/07/2014  . NSTEMI (non-ST elevated myocardial infarction) 07/07/2014  . Anemia, blood loss 07/06/2014  . Carotid artery bruit 08/28/2013  . Heart murmur, systolic 62/70/3500  . RBBB 02/16/2013  . Type 2 diabetes mellitus with peripheral neuropathy 02/16/2013  . Benign hypertensive heart disease without heart failure 08/02/2011  . Pure hypercholesterolemia 08/02/2011  . Asymptomatic PVCs 08/02/2011  . Breast cancer metastasized to bone 08/02/2011    History  Smoking status  . Never Smoker   Smokeless tobacco  . Not on file    History  Alcohol Use No    Family History  Problem Relation Age of Onset  . Hypertension Mother   . Heart attack Father   . Hypertension Father   . Diabetes Sister   . Hypertension Sister   . Liver cancer Sister   . Heart attack Brother   . Hypertension Brother   . Diabetes Brother   . Diabetes Sister   . Hypertension Sister     Review of Systems: Constitutional: no fever chills diaphoresis or fatigue or change in weight.  Head and neck: no hearing loss, no epistaxis, no photophobia or visual disturbance. Respiratory: No cough, shortness of breath or wheezing. Cardiovascular: No chest pain peripheral edema, palpitations. Gastrointestinal: No abdominal distention, no abdominal pain, no change in bowel habits hematochezia or melena. Genitourinary: No dysuria, no frequency, no urgency, no nocturia. Musculoskeletal:No arthralgias, no back pain, no gait  disturbance or myalgias. Neurological: No dizziness, no headaches, no numbness, no seizures, no syncope, no weakness, no tremors. Hematologic: No lymphadenopathy, no easy bruising. Psychiatric: No confusion, no hallucinations, no sleep disturbance.    Physical Exam: Filed Vitals:   09/07/14 1520  BP: 142/80  Pulse: 70   the general appearance reveals a well-developed mildly overweight woman in a wheelchair.  She is in no acute distress.The head and neck exam reveals pupils equal and  reactive.  Extraocular movements are full.  There is no scleral icterus.  The mouth and pharynx are normal.  The neck is supple.  The carotids reveal a soft left carotid bruit.  The jugular venous pressure is normal.  The  thyroid is not enlarged.  There is no lymphadenopathy.  The chest is clear to percussion and auscultation.  There are no rales or rhonchi.  Expansion of the chest is symmetrical.  The precordium is quiet.  The first heart sound is normal.  The second heart sound is physiologically split.  There is no  gallop rub or click.  There is a soft systolic ejection murmur at the base.  No diastolic murmur.  There is no abnormal lift or heave.  The abdomen is soft and nontender.  The bowel sounds are normal.  The liver and spleen are not enlarged.  There are no abdominal masses.  There are no abdominal bruits.  Extremities reveal good pedal pulses.  There is no phlebitis or edema.  There is no cyanosis or clubbing.  Strength is normal and symmetrical in all extremities.  There is no lateralizing weakness.  There are no sensory deficits.  The skin is warm and dry.  There is no rash.     Assessment / Plan: 1. benign hypertension without congestive heart failure 2. left carotid bruit 3. Goiter 4. mild aortic stenosis 5. type 2 diabetes mellitus without mention of complication, uncontrolled 6. breast cancer with prior history of metastasis to bone  Plan: Continue on careful heart healthy diet.  Continue same  medication.  Recheck in 6 months for followup office visit.

## 2014-09-07 NOTE — Assessment & Plan Note (Signed)
She reports that her blood loss anemia has improved since the polyps were removed.  She is being followed by her oncologist Dr. Benay Spice.

## 2014-09-07 NOTE — Assessment & Plan Note (Signed)
Her blood pressure has come back into normal limits for her.  Her insurance coverage for her Exforge will be changing and they will no longer be covered.  We gave her prescription for the components which are amlodipine and valsartan.

## 2014-09-08 ENCOUNTER — Other Ambulatory Visit: Payer: Self-pay | Admitting: Oncology

## 2014-09-08 DIAGNOSIS — C50911 Malignant neoplasm of unspecified site of right female breast: Secondary | ICD-10-CM

## 2014-09-09 ENCOUNTER — Other Ambulatory Visit: Payer: Self-pay | Admitting: Cardiology

## 2014-09-17 ENCOUNTER — Other Ambulatory Visit (HOSPITAL_COMMUNITY): Payer: Self-pay | Admitting: *Deleted

## 2014-09-17 DIAGNOSIS — I6523 Occlusion and stenosis of bilateral carotid arteries: Secondary | ICD-10-CM

## 2014-09-20 ENCOUNTER — Telehealth: Payer: Self-pay | Admitting: Cardiology

## 2014-09-20 NOTE — Telephone Encounter (Signed)
Spoke with patient and since changing from Exforge to Valsartan and Amlodipine has had headache, blood pressure today 170/80's Patient would like to try changing back to Exforge and see if this makes a difference. Advised ok to change but to make sure she did not take the Valsartan and Amlodipine with the Exforge.  Will call patient back on Wednesday to follow up

## 2014-09-20 NOTE — Telephone Encounter (Signed)
New message     Patient calling would like for the nurse to call discuss medication

## 2014-09-22 ENCOUNTER — Ambulatory Visit (HOSPITAL_BASED_OUTPATIENT_CLINIC_OR_DEPARTMENT_OTHER): Payer: Medicare Other | Admitting: Nurse Practitioner

## 2014-09-22 ENCOUNTER — Telehealth: Payer: Self-pay | Admitting: Nurse Practitioner

## 2014-09-22 ENCOUNTER — Other Ambulatory Visit (HOSPITAL_BASED_OUTPATIENT_CLINIC_OR_DEPARTMENT_OTHER): Payer: Medicare Other

## 2014-09-22 VITALS — BP 157/60 | HR 70 | Temp 99.0°F | Resp 18 | Ht 65.0 in | Wt 198.6 lb

## 2014-09-22 DIAGNOSIS — C7951 Secondary malignant neoplasm of bone: Secondary | ICD-10-CM

## 2014-09-22 DIAGNOSIS — K635 Polyp of colon: Secondary | ICD-10-CM

## 2014-09-22 DIAGNOSIS — C50911 Malignant neoplasm of unspecified site of right female breast: Secondary | ICD-10-CM

## 2014-09-22 DIAGNOSIS — C50919 Malignant neoplasm of unspecified site of unspecified female breast: Secondary | ICD-10-CM

## 2014-09-22 DIAGNOSIS — C9110 Chronic lymphocytic leukemia of B-cell type not having achieved remission: Secondary | ICD-10-CM

## 2014-09-22 DIAGNOSIS — Z170 Estrogen receptor positive status [ER+]: Secondary | ICD-10-CM

## 2014-09-22 LAB — CBC WITH DIFFERENTIAL/PLATELET
BASO%: 1.1 % (ref 0.0–2.0)
BASOS ABS: 0.2 10*3/uL — AB (ref 0.0–0.1)
EOS ABS: 0.2 10*3/uL (ref 0.0–0.5)
EOS%: 1 % (ref 0.0–7.0)
HEMATOCRIT: 38.5 % (ref 34.8–46.6)
HEMOGLOBIN: 12.2 g/dL (ref 11.6–15.9)
LYMPH%: 38.7 % (ref 14.0–49.7)
MCH: 26.7 pg (ref 25.1–34.0)
MCHC: 31.6 g/dL (ref 31.5–36.0)
MCV: 84.3 fL (ref 79.5–101.0)
MONO#: 0.9 10*3/uL (ref 0.1–0.9)
MONO%: 4.1 % (ref 0.0–14.0)
NEUT%: 55.1 % (ref 38.4–76.8)
NEUTROS ABS: 12.6 10*3/uL — AB (ref 1.5–6.5)
Platelets: 344 10*3/uL (ref 145–400)
RBC: 4.56 10*6/uL (ref 3.70–5.45)
RDW: 20.4 % — AB (ref 11.2–14.5)
WBC: 22.8 10*3/uL — ABNORMAL HIGH (ref 3.9–10.3)
lymph#: 8.8 10*3/uL — ABNORMAL HIGH (ref 0.9–3.3)

## 2014-09-22 LAB — COMPREHENSIVE METABOLIC PANEL (CC13)
ALBUMIN: 3.4 g/dL — AB (ref 3.5–5.0)
ALK PHOS: 90 U/L (ref 40–150)
ALT: 17 U/L (ref 0–55)
AST: 14 U/L (ref 5–34)
Anion Gap: 13 mEq/L — ABNORMAL HIGH (ref 3–11)
BUN: 11.4 mg/dL (ref 7.0–26.0)
CO2: 21 mEq/L — ABNORMAL LOW (ref 22–29)
Calcium: 9.5 mg/dL (ref 8.4–10.4)
Chloride: 103 mEq/L (ref 98–109)
Creatinine: 0.9 mg/dL (ref 0.6–1.1)
Glucose: 308 mg/dl — ABNORMAL HIGH (ref 70–140)
POTASSIUM: 3.8 meq/L (ref 3.5–5.1)
Sodium: 136 mEq/L (ref 136–145)
Total Bilirubin: 0.23 mg/dL (ref 0.20–1.20)
Total Protein: 6.7 g/dL (ref 6.4–8.3)

## 2014-09-22 NOTE — Progress Notes (Addendum)
[place] OFFICE PROGRESS NOTE   Diagnosis: breast cancer, anemia   INTERVAL HISTORY:   Molly [name] returns as scheduled. She continues Arimidex. No significant joint pains. She has pain related to "diabetic neuropathy". She denies any bleeding. She continues oral iron and notes that her stools are black as a result. Bowels are moving regularly. Appetite varies.  Objective:  Vital signs in last 24 hours:  Blood pressure 157/60, pulse 70, temperature 99 F (37.2 C), temperature source Oral, resp. rate 18, height 5\' 5"  (1.651 m), weight 198 lb 9.6 oz (90.084 kg).    HEENT: no thrush or ulcers. Lymphatics: no palpable cervical, supraclavicular or axillary lymph nodes. Resp: lungs clear bilaterally. Cardio: regular rate and rhythm. GI: abdomen soft and nontender. No hepatomegaly. Vascular: no leg edema. Calves nontender.  Lab Results:  Lab Results  Component Value Date   WBC 22.8* 09/22/2014   HGB 12.2 09/22/2014   HCT 38.5 09/22/2014   MCV 84.3 09/22/2014   PLT 344 09/22/2014   NEUTROABS 12.6* 09/22/2014    Imaging:  No results found.  Medications: I have reviewed the patient's current medications.  Assessment/Plan: 1. Breast cancer, right-sided breast cancer (T2 N2) diagnosed in 2009, status post a right lumpectomy and adjuvant radiation. She was diagnosed with a pathologic left femur fracture in November 2011. A staging bone scan and CT scan in November 2011 confirmed multiple bone metastases and a cystic metastasis at the left iliac muscle. No visceral metastases were seen. She began Arimidex following an office visit 11/07/2010. The Arimidex was held for approximately 1 week after an office visit on 02/09/2011 and then resumed on 02/16/2011.  1. History of elevated CA 27-29. The CA 27.29 was normal on 09/21/2013. 2. Bone scan 11/04/2013 with stable multifocal areas of bone metastasis. No new or progressive disease identified. 2. Status post a left  hip hemiarthroplasty for treatment of a pathologic fracture 10/07/2010. 3. Status post radiation to the left proximal femur 12/22 through 11/17/2010. 4. Diabetes. 5. Hypertension. 6. History of arthralgias, likely related to degenerative arthritis 7. Leukocytosis/lymphocytosis-chronic. Review the peripheral blood smear from 02/01/2012 was consistent with a diagnosis of chronic lymphocytic leukemia. A peripheral blood sample was submitted for flow cytometry on 06/03/2012. This confirmed a diagnosis of chronic lymphocytic leukemia. 8. Recurrent "bladder" pain-relieved with ciprofloxacin, it is not clear the pain is related to urinary tract infection, but she insists the pain is relieved with ciprofloxacin. 9. Left abdomen/iliac pain-etiology unclear. 10. Cystic left pelvic lesion. Present on x-ray studies dating to 2002. 11. Anemia 04/13/2014. Progressive, microcytic, 07/06/2014. Status post an unremarkable upper endoscopy 07/08/2014, colonoscopy 07/10/2014 with 4 polyps-removed, tubular adenomas. The iron deficiency anemia was likely related to colon polyps. 12. Heme positive stool 04/13/2014. 13. Night sweats. Question related to CLL, question diabetes. 14. Admission 07/06/2014 with a pre-syncope event   Disposition:Molly [name] appears stable. She will continue Arimidex for metastatic breast cancer. We will followup on the CA 27-29 from today. The plan is for a restaging bone scan prior to her next visit in 3-4 months.  We discussed resuming Zometa. She received a single infusion on 11/15/2010. She declined further infusions citing insomnia, malaise and pain at various sites which she attributed to Zometa. She will consider resuming Zometa and let us know her decision at the next visit  The hemoglobin has corrected into normal range with iron. She will continue once daily iron.  We recommended both the influenza and pneumococcal vaccines. She declines both.  She  will return for a followup  visit in 3-4 months. She will contact the office in the interim with any problems.  Patient seen with Dr. Benay Spice.    Ned Card ANP/GNP-BC   09/22/2014  2:33 PM  This was a shared visit with Ned Card.  Molly [name] will continue Arimidex.  She will return for a f/u appt. In 4 months.  Molly [name]

## 2014-09-22 NOTE — Telephone Encounter (Signed)
gave avs & cal for Feb/March 2016.

## 2014-09-23 LAB — CANCER ANTIGEN 27.29: CA 27.29: 23 U/mL (ref 0–39)

## 2014-09-23 NOTE — Telephone Encounter (Signed)
Spoke with patient and she had doctors appointment yesterday and her machine was not working accurately.  Patient states she is feeling better and has remained on Valsartan and Amlodipine separately

## 2014-09-24 ENCOUNTER — Telehealth: Payer: Self-pay | Admitting: *Deleted

## 2014-09-24 NOTE — Telephone Encounter (Signed)
Pt returned call, lab results given. She voiced understanding. She's not sure if she will have bone scan done in Feb. She will call office if she decides against it.

## 2014-09-24 NOTE — Telephone Encounter (Signed)
-----   Message from Ladell Pier, MD sent at 09/23/2014  5:44 PM EST ----- Please call patient, ca27.29 is normal

## 2014-10-01 ENCOUNTER — Telehealth: Payer: Self-pay | Admitting: *Deleted

## 2014-10-01 ENCOUNTER — Telehealth: Payer: Self-pay | Admitting: Oncology

## 2014-10-01 NOTE — Telephone Encounter (Signed)
POF sent to scheduler 

## 2014-10-01 NOTE — Telephone Encounter (Signed)
Received phone call from patient asking if she could move her lab appointment to the same day as appointment with Dr. Benay Spice and also asked if Bone scan can be moved to [facility] Instead of [facility].  Informed Elby Showers. Marcello Moores, NP.  Per Elby Showers. Marcello Moores, NP OK to move appointments.  POF sent to scheduler.  Informed patient that schedulers should be calling with those appointments.  Patient verbalized understanding.

## 2014-10-01 NOTE — Telephone Encounter (Signed)
s.w. pt and advised on all adjusted appts....pt ok adn aware

## 2014-10-08 ENCOUNTER — Ambulatory Visit (HOSPITAL_COMMUNITY): Payer: Medicare Other | Attending: Internal Medicine | Admitting: *Deleted

## 2014-10-08 DIAGNOSIS — E785 Hyperlipidemia, unspecified: Secondary | ICD-10-CM | POA: Diagnosis not present

## 2014-10-08 DIAGNOSIS — I6523 Occlusion and stenosis of bilateral carotid arteries: Secondary | ICD-10-CM | POA: Insufficient documentation

## 2014-10-08 DIAGNOSIS — I10 Essential (primary) hypertension: Secondary | ICD-10-CM | POA: Diagnosis not present

## 2014-10-08 DIAGNOSIS — I6529 Occlusion and stenosis of unspecified carotid artery: Secondary | ICD-10-CM | POA: Diagnosis present

## 2014-10-08 DIAGNOSIS — E119 Type 2 diabetes mellitus without complications: Secondary | ICD-10-CM | POA: Insufficient documentation

## 2014-10-08 NOTE — Progress Notes (Signed)
Carotid duplex complete 

## 2014-11-03 ENCOUNTER — Encounter: Payer: Self-pay | Admitting: Podiatrist

## 2014-11-03 ENCOUNTER — Ambulatory Visit (INDEPENDENT_AMBULATORY_CARE_PROVIDER_SITE_OTHER): Payer: Medicare Other | Admitting: Podiatrist

## 2014-11-03 VITALS — BP 175/81 | HR 75 | Resp 16

## 2014-11-03 DIAGNOSIS — B351 Tinea unguium: Secondary | ICD-10-CM

## 2014-11-03 DIAGNOSIS — M79676 Pain in unspecified toe(s): Secondary | ICD-10-CM

## 2014-11-03 NOTE — Patient Instructions (Signed)
Diabetes and Foot Care Diabetes may cause you to have problems because of poor blood supply (circulation) to your feet and legs. This may cause the skin on your feet to become thinner, break easier, and heal more slowly. Your skin may become dry, and the skin may peel and crack. You may also have nerve damage in your legs and feet causing decreased feeling in them. You may not notice minor injuries to your feet that could lead to infections or more serious problems. Taking care of your feet is one of the most important things you can do for yourself.  HOME CARE INSTRUCTIONS  Wear shoes at all times, even in the house. Do not go barefoot. Bare feet are easily injured.  Check your feet daily for blisters, cuts, and redness. If you cannot see the bottom of your feet, use a mirror or ask someone for help.  Wash your feet with warm water (do not use hot water) and mild soap. Then pat your feet and the areas between your toes until they are completely dry. Do not soak your feet as this can dry your skin.  Apply a moisturizing lotion or petroleum jelly (that does not contain alcohol and is unscented) to the skin on your feet and to dry, brittle toenails. Do not apply lotion between your toes.  Trim your toenails straight across. Do not dig under them or around the cuticle. File the edges of your nails with an emery board or nail file.  Do not cut corns or calluses or try to remove them with medicine.  Wear clean socks or stockings every day. Make sure they are not too tight. Do not wear knee-high stockings since they may decrease blood flow to your legs.  Wear shoes that fit properly and have enough cushioning. To break in new shoes, wear them for just a few hours a day. This prevents you from injuring your feet. Always look in your shoes before you put them on to be sure there are no objects inside.  Do not cross your legs. This may decrease the blood flow to your feet.  If you find a minor scrape,  cut, or break in the skin on your feet, keep it and the skin around it clean and dry. These areas may be cleansed with mild soap and water. Do not cleanse the area with peroxide, alcohol, or iodine.  When you remove an adhesive bandage, be sure not to damage the skin around it.  If you have a wound, look at it several times a day to make sure it is healing.  Do not use heating pads or hot water bottles. They may burn your skin. If you have lost feeling in your feet or legs, you may not know it is happening until it is too late.  Make sure your health care provider performs a complete foot exam at least annually or more often if you have foot problems. Report any cuts, sores, or bruises to your health care provider immediately. SEEK MEDICAL CARE IF:   You have an injury that is not healing.  You have cuts or breaks in the skin.  You have an ingrown nail.  You notice redness on your legs or feet.  You feel burning or tingling in your legs or feet.  You have pain or cramps in your legs and feet.  Your legs or feet are numb.  Your feet always feel cold. SEEK IMMEDIATE MEDICAL CARE IF:   There is increasing redness,   swelling, or pain in or around a wound.  There is a red line that goes up your leg.  Pus is coming from a wound.  You develop a fever or as directed by your health care provider.  You notice a bad smell coming from an ulcer or wound. Document Released: 10/26/2000 Document Revised: 07/01/2013 Document Reviewed: 04/07/2013 ExitCare Patient Information 2015 [employer]. This information is not intended to replace advice given to you by your health care provider. Make sure you discuss any questions you have with your health care provider.  

## 2014-11-03 NOTE — Progress Notes (Signed)
   Subjective:    Patient ID: Molly [name], female    DOB: 1923-07-19, 80 y.o.   MRN: [id number]  HPI Comments: Patient states that she needs her toenails cut. She is unable to trim them herself.     Review of Systems  All other systems reviewed and are negative.      Objective:   Physical Exam GENERAL APPEARANCE: Alert, conversant. Appropriately groomed. No acute distress.  VASCULAR: Pedal pulses palpable at 1/4 DP and PT right and 2/4 dp and 1/4 pt left.  Capillary refill time is immediate to all digits,  Proximal to distal cooling it warm to warm.  Digital hair growth is present bilateral  NEUROLOGIC: sensation is intact epicritically and protectively to 5.07 monofilament at 5/5 sites bilateral.  Light touch is intact bilateral, vibratory sensation intact bilateral, achilles tendon reflex is intact bilateral.  MUSCULOSKELETAL: acceptable muscle strength, tone and stability bilateral.  Intrinsic muscluature intact bilateral.  Rectus appearance of foot and digits noted bilateral.   DERMATOLOGIC: skin color, texture, and turger are within normal limits.  No preulcerative lesions are seen, no interdigital maceration noted.  No open lesions present.  Digital nails are elongated and thickened with pain at the nail plates 2-4 bilateral. Bilateral hallux nails are severely dystrophic, growing thick with subungual debris present and also painful.     Assessment & Plan:  Symptomatic mycotic toenails  Plan:  Discussed etiology, pathology, conservative vs. Surgical therapies and at this time debridement of symptomatic toenails was recommended.  Onychoreduction of symptomatic toenails was performed without iatrogenic incident.  Patient was instructed on signs and symptoms of infection and was told to call immediately should any of these arise.

## 2014-11-10 ENCOUNTER — Ambulatory Visit: Payer: Self-pay | Admitting: Podiatrist

## 2014-11-26 ENCOUNTER — Other Ambulatory Visit: Payer: Self-pay | Admitting: Oncology

## 2014-11-27 ENCOUNTER — Telehealth: Payer: Self-pay | Admitting: Oncology

## 2014-11-27 NOTE — Telephone Encounter (Signed)
s.w pt regarding appt d.t change due to MD on call....pt ok and aware °

## 2014-11-29 ENCOUNTER — Other Ambulatory Visit: Payer: Self-pay | Admitting: Family Medicine

## 2014-11-29 DIAGNOSIS — Z853 Personal history of malignant neoplasm of breast: Secondary | ICD-10-CM

## 2014-12-08 ENCOUNTER — Other Ambulatory Visit: Payer: Self-pay | Admitting: Oncology

## 2015-01-05 ENCOUNTER — Other Ambulatory Visit: Payer: Medicare Other

## 2015-01-10 ENCOUNTER — Encounter (HOSPITAL_COMMUNITY): Payer: Medicare Other

## 2015-01-10 ENCOUNTER — Other Ambulatory Visit: Payer: Medicare Other

## 2015-01-12 ENCOUNTER — Telehealth: Payer: Self-pay | Admitting: Cardiology

## 2015-01-12 MED ORDER — FUROSEMIDE 20 MG PO TABS: 20.0000 mg | ORAL_TABLET | Freq: Every day | ORAL | Status: DC

## 2015-01-12 NOTE — Telephone Encounter (Signed)
New Msg       Pt calling, request call back.   Did not provide details.

## 2015-01-12 NOTE — Telephone Encounter (Signed)
Add furosemide 20 mg one daily to help stabilize blood pressure.

## 2015-01-12 NOTE — Telephone Encounter (Signed)
Patient was changed from Exforge to Amlodipine 10 mg and Valsartan 320 mg daily in October Patient also had her Atenolol increased from 25 mg twice a day to 50 mg daily Patient states she has been having issues with her swelling in her lower extremities, denies any increase shortness of breath Patient states she generally just does not feel good Blood pressure and blood sugars "up and down, all over the place". No numbers recorded just blood pressure up in the 160's-170's at times Patient concerned that the medication changes are contributing to the swelling.  Did not take her evening dose of Atenolol yesterday and thinks swelling some better Will forward to  Dr. Mare Ferrari for review

## 2015-01-12 NOTE — Telephone Encounter (Signed)
Advised patient

## 2015-01-14 ENCOUNTER — Encounter (HOSPITAL_COMMUNITY)
Admission: RE | Admit: 2015-01-14 | Discharge: 2015-01-14 | Disposition: A | Discharge status: Home / Self Care | Payer: Medicare Other | Source: Ambulatory Visit | Attending: Nurse Practitioner | Admitting: Nurse Practitioner

## 2015-01-14 DIAGNOSIS — C7951 Secondary malignant neoplasm of bone: Secondary | ICD-10-CM | POA: Insufficient documentation

## 2015-01-14 DIAGNOSIS — K635 Polyp of colon: Secondary | ICD-10-CM | POA: Diagnosis present

## 2015-01-14 DIAGNOSIS — C50919 Malignant neoplasm of unspecified site of unspecified female breast: Secondary | ICD-10-CM | POA: Diagnosis present

## 2015-01-14 MED ORDER — TECHNETIUM TC 99M MEDRONATE IV KIT
25.0000 | PACK | Freq: Once | INTRAVENOUS | Status: AC | PRN
  Administered 2015-01-14: 25 via INTRAVENOUS

## 2015-01-14 MED ORDER — TECHNETIUM TC 99M MEDRONATE IV KIT: 25.0000 | PACK | Freq: Once | INTRAVENOUS | Status: AC | PRN

## 2015-01-25 ENCOUNTER — Ambulatory Visit: Payer: Medicare Other | Admitting: Oncology

## 2015-01-25 ENCOUNTER — Other Ambulatory Visit: Payer: Medicare Other

## 2015-01-28 ENCOUNTER — Telehealth: Payer: Self-pay | Admitting: *Deleted

## 2015-01-28 NOTE — Telephone Encounter (Signed)
Called asking for a call from Dr. Benay Spice or Ned Card by 5:00 pm today.  Call is in reference to scan performed 01-14-2015.  Noted F/U visit for 02-07-2015.  Informed her she will obtain results at F/U visit.  Will notify staff af this request.

## 2015-02-02 ENCOUNTER — Telehealth: Payer: Self-pay | Admitting: *Deleted

## 2015-02-02 DIAGNOSIS — M7989 Other specified soft tissue disorders: Secondary | ICD-10-CM

## 2015-02-02 NOTE — Telephone Encounter (Signed)
Attempted to work pt in for doppler of RLE today. Pt stated she could only go after appt with Dr. Buddy Duty. [facility] unable to work pt in for today. Called [facility] vascular lab, they are unable to work pt in this afternoon. Pt relies on daughter for transportation. Unable to reach daughter. Left message requesting return call.  Scheduled pt tentatively at [facility] for 3/24 at 1630. Per Jenny Reichmann in vascular, the earlier pt can arrive, the better.

## 2015-02-03 ENCOUNTER — Ambulatory Visit (HOSPITAL_COMMUNITY): Payer: Medicare Other

## 2015-02-03 ENCOUNTER — Other Ambulatory Visit: Payer: Self-pay | Admitting: Oncology

## 2015-02-03 ENCOUNTER — Telehealth: Payer: Self-pay | Admitting: *Deleted

## 2015-02-03 DIAGNOSIS — M7989 Other specified soft tissue disorders: Secondary | ICD-10-CM

## 2015-02-03 NOTE — Telephone Encounter (Addendum)
Returned call to pt, she can not go for doppler today. Pt requests to be scheduled for doppler 3/25 at 1100. Spoke with Jenny Reichmann in vascular lab, pt worked in for 3/25 at 74. Instructed pt to report to ED for severe pain in her leg or any shortness of breath.

## 2015-02-03 NOTE — Telephone Encounter (Signed)
Patient called 9:45am and LM.  Called patient at 10am.  She states she cannot go today for doppler.  She relies on her dtr.for transportation and her dtr. Is working today.  She requests that we try and get her an appt. Tomorrow early in the morning, but not too early- around 11am.  Not in the afternoon as her dtr. Has an appt. At 2pm on Friday.  She also requests that she would like this done at [facility] if they are open and she would like [place] to call her back ASAP.  Wll let Tanya RN with Dr. Benay Spice know.

## 2015-02-04 ENCOUNTER — Ambulatory Visit (HOSPITAL_COMMUNITY)
Admission: RE | Admit: 2015-02-04 | Discharge: 2015-02-04 | Disposition: A | Discharge status: Home / Self Care | Payer: Medicare Other | Source: Ambulatory Visit | Attending: Oncology | Admitting: Oncology

## 2015-02-04 DIAGNOSIS — R600 Localized edema: Secondary | ICD-10-CM | POA: Insufficient documentation

## 2015-02-04 DIAGNOSIS — M7989 Other specified soft tissue disorders: Secondary | ICD-10-CM

## 2015-02-04 NOTE — Progress Notes (Addendum)
VASCULAR LAB PRELIMINARY  PRELIMINARY  PRELIMINARY  PRELIMINARY  Right lower extremity venous duplex completed.    Preliminary report:  Right:  No evidence of DVT, superficial thrombosis, or Baker's cyst.  Molly [name], RVS 02/04/2015, 1:42 PM

## 2015-02-04 NOTE — Telephone Encounter (Signed)
[place] from the Vascular Lab called to the doppler of the right LE was completely negative.  Results should be in EPIC.  She has released the patient home.

## 2015-02-07 ENCOUNTER — Ambulatory Visit (HOSPITAL_BASED_OUTPATIENT_CLINIC_OR_DEPARTMENT_OTHER): Payer: Medicare Other | Admitting: Oncology

## 2015-02-07 ENCOUNTER — Other Ambulatory Visit (HOSPITAL_BASED_OUTPATIENT_CLINIC_OR_DEPARTMENT_OTHER): Payer: Medicare Other

## 2015-02-07 ENCOUNTER — Telehealth: Payer: Self-pay | Admitting: Oncology

## 2015-02-07 VITALS — BP 158/46 | HR 65 | Temp 98.9°F | Resp 18 | Ht 65.0 in | Wt 201.8 lb

## 2015-02-07 DIAGNOSIS — Z79811 Long term (current) use of aromatase inhibitors: Secondary | ICD-10-CM

## 2015-02-07 DIAGNOSIS — C50911 Malignant neoplasm of unspecified site of right female breast: Secondary | ICD-10-CM

## 2015-02-07 DIAGNOSIS — C50919 Malignant neoplasm of unspecified site of unspecified female breast: Secondary | ICD-10-CM

## 2015-02-07 DIAGNOSIS — C7951 Secondary malignant neoplasm of bone: Principal | ICD-10-CM

## 2015-02-07 DIAGNOSIS — K635 Polyp of colon: Secondary | ICD-10-CM

## 2015-02-07 LAB — CBC WITH DIFFERENTIAL/PLATELET
BASO%: 0.2 % (ref 0.0–2.0)
BASOS ABS: 0.1 10*3/uL (ref 0.0–0.1)
EOS%: 1.1 % (ref 0.0–7.0)
Eosinophils Absolute: 0.3 10*3/uL (ref 0.0–0.5)
HEMATOCRIT: 38 % (ref 34.8–46.6)
HEMOGLOBIN: 12.5 g/dL (ref 11.6–15.9)
LYMPH#: 13.2 10*3/uL — AB (ref 0.9–3.3)
LYMPH%: 53.1 % — AB (ref 14.0–49.7)
MCH: 29.8 pg (ref 25.1–34.0)
MCHC: 32.9 g/dL (ref 31.5–36.0)
MCV: 90.5 fL (ref 79.5–101.0)
MONO#: 1 10*3/uL — ABNORMAL HIGH (ref 0.1–0.9)
MONO%: 4 % (ref 0.0–14.0)
NEUT#: 10.3 10*3/uL — ABNORMAL HIGH (ref 1.5–6.5)
NEUT%: 41.6 % (ref 38.4–76.8)
PLATELETS: 336 10*3/uL (ref 145–400)
RBC: 4.2 10*6/uL (ref 3.70–5.45)
RDW: 14 % (ref 11.2–14.5)
WBC: 24.8 10*3/uL — ABNORMAL HIGH (ref 3.9–10.3)

## 2015-02-07 LAB — TECHNOLOGIST REVIEW

## 2015-02-07 NOTE — Telephone Encounter (Signed)
Gave avs & calendar for September °

## 2015-02-07 NOTE — Progress Notes (Signed)
[place] OFFICE PROGRESS NOTE   Diagnosis: Breast cancer  INTERVAL HISTORY:   Molly [name] returns as scheduled. She continues Arimidex. She has upper body sweats at night. No pain. She is able to ambulate in the home. She developed a "knot "in the right calf last week. A right lower extremity Doppler on 02/04/2015 was negative for the vein or superficial thrombosis.  Objective:  Vital signs in last 24 hours:  Blood pressure 158/46, pulse 65, temperature 98.9 F (37.2 C), temperature source Oral, resp. rate 18, height 5\' 5"  (1.651 m), weight 201 lb 12.8 oz (91.536 kg), SpO2 97 %.    HEENT: Neck without mass Lymphatics: No cervical, supra-clavicular, axillary nodes Resp: Lungs clear bilaterally Cardio: Regular rate and rhythm GI: No hepatomegaly Vascular: Trace edema at the right greater than left lower leg, calf without erythema or tenderness Breast: Status post right lumpectomy. No evidence for local tumor recurrence. Musculoskeletal: No spine tenderness     Lab Results:  Lab Results  Component Value Date   WBC 24.8* 02/07/2015   HGB 12.5 02/07/2015   HCT 38.0 02/07/2015   MCV 90.5 02/07/2015   PLT 336 02/07/2015   NEUTROABS 10.3* 02/07/2015     Imaging: Bone scan 01/14/2015, compared to 11/04/2013-multiple areas of abnormal tracer activity including a lower anterior left rib-unchanged, lateral border of the inferior sternum-unchanged, T12, L4-unchanged. New increased activity at L3. Slight increased soft tissue tracer localization at the right calf   Medications: I have reviewed the patient's current medications.  Assessment/Plan: 1. Breast cancer, right-sided breast cancer (T2 N2) diagnosed in 2009, status post a right lumpectomy and adjuvant radiation. She was diagnosed with a pathologic left femur fracture in November 2011. A staging bone scan and CT scan in November 2011 confirmed multiple bone metastases and a cystic metastasis at the left  iliac muscle. No visceral metastases were seen. She began Arimidex following an office visit 11/07/2010. The Arimidex was held for approximately 1 week after an office visit on 02/09/2011 and then resumed on 02/16/2011.  1. History of elevated CA 27-29. The CA 27.29 was normal on 09/21/2013. 2. Bone scan 11/04/2013 with stable multifocal areas of bone metastasis. No new or progressive disease identified.  Bone scan 01/14/2015 with a possible area of new uptake at L3, otherwise stable 2. Status post a left hip hemiarthroplasty for treatment of a pathologic fracture 10/07/2010. 3. Status post radiation to the left proximal femur 12/22 through 11/17/2010. 4. Diabetes. 5. Hypertension. 6. History of arthralgias, likely related to degenerative arthritis 7. Leukocytosis/lymphocytosis-chronic. Review the peripheral blood smear from 02/01/2012 was consistent with a diagnosis of chronic lymphocytic leukemia. A peripheral blood sample was submitted for flow cytometry on 06/03/2012. This confirmed a diagnosis of chronic lymphocytic leukemia. 8. Recurrent "bladder" pain-relieved with ciprofloxacin, it is not clear the pain is related to urinary tract infection, but she insists the pain is relieved with ciprofloxacin. 9. Left abdomen/iliac pain-etiology unclear. 10. Cystic left pelvic lesion. Present on x-ray studies dating to 2002. 11. Anemia 04/13/2014. Progressive, microcytic, 07/06/2014. Status post an unremarkable upper endoscopy 07/08/2014, colonoscopy 07/10/2014 with 4 polyps-removed, tubular adenomas. The iron deficiency anemia was likely related to colon polyps. 12. Heme positive stool 04/13/2014. 13. Night sweats. Question related to CLL, question diabetes. 14. Admission 07/06/2014 with a pre-syncope event    Disposition:  She appears stable. I reviewed the bone scan images with Molly [name] and her daughter. There may be a new area of increased tracer activity at L3,  the bone scan is otherwise  stable. She will contact us for back pain or new symptoms. Molly [name] will continue Arimidex. She declines biphosphonate therapy.  She will return for an office visit in 6 months. She will contact us in the interim for new symptoms.  Betsy Coder, MD  02/07/2015  5:01 PM

## 2015-02-08 LAB — CANCER ANTIGEN 27.29: CA 27.29: 22 U/mL (ref 0–39)

## 2015-02-09 ENCOUNTER — Telehealth: Payer: Self-pay | Admitting: *Deleted

## 2015-02-09 NOTE — Telephone Encounter (Signed)
Per Dr. Benay Spice; notified pt that ca27:29 is normal and to continue taking Arimidex.  Pt verbalized understanding and confirmed appt for 08/12/2015

## 2015-02-09 NOTE — Telephone Encounter (Signed)
-----   Message from Ladell Pier, MD sent at 02/09/2015  3:00 PM EDT ----- Please call patient, ca27.29 is normal, continue arimidex

## 2015-02-18 ENCOUNTER — Ambulatory Visit (INDEPENDENT_AMBULATORY_CARE_PROVIDER_SITE_OTHER): Payer: Medicare Other | Admitting: Cardiology

## 2015-02-18 ENCOUNTER — Encounter: Payer: Self-pay | Admitting: Cardiology

## 2015-02-18 VITALS — BP 148/52 | HR 64 | Ht 66.0 in | Wt 203.1 lb

## 2015-02-18 DIAGNOSIS — R0989 Other specified symptoms and signs involving the circulatory and respiratory systems: Secondary | ICD-10-CM | POA: Diagnosis not present

## 2015-02-18 DIAGNOSIS — I10 Essential (primary) hypertension: Secondary | ICD-10-CM | POA: Diagnosis not present

## 2015-02-18 DIAGNOSIS — I350 Nonrheumatic aortic (valve) stenosis: Secondary | ICD-10-CM

## 2015-02-18 NOTE — Progress Notes (Signed)
Cardiology Office Note   Date:  02/18/2015   ID:  Molly [name], DOB 02/14/1923, MRN [id number]  PCP:  Antony Blackbird, MD  Cardiologist:   Darlin Coco, MD   No chief complaint on file.     History of Present Illness: Molly [name] is a 81 y.o. female who presents for a six-month follow-up office visit  This 81 year old woman is seen for a scheduled followup office visit. She has a past history of essential hypertension and hypercholesterolemia. She also has a history of aortic valve sclerosis. She has had a history of arrhythmia with PVCs. She has a history of breast cancer with metastasis to bone and she had a previous pathologic fracture of her left hip. He does not have any history of ischemic heart disease and she had a normal nuclear stress test in 2009. She was told many years ago by her former PCP Dr. Conley Canal that she had a small abdominal aortic aneurysm. She has a history of diabetes and is followed by Dr. Buddy Duty.  Since last visit she has not had any new cardiac symptoms. She has noted some blurring of her vision which she attributes to her blood pressure being high and her blood sugar being high. Her last echocardiogram was 10/20/03 and at that time had an ejection fraction of 55-65% with LVH and with small degree of left ventricular outflow tract dynamic obstruction. She had carotid Doppler ultrasound on 09/16/13 which showed no significant carotid artery stenosis but she did have a right thyroid nodule. She has a known goiter. The patient had an echocardiogram on 09/15/13 showing mild aortic stenosis with peak gradient of 19 and a mean gradient of 12 and her ejection fraction was 55-60%. Since her last saw her the patient was hospitalized for blood loss anemia while in the hospital her blood pressure was quite labile. She was sent home on BiDil but could not tolerate it because of side effects. In the hospital she had colonoscopy which revealed 4 colon polyps to be  the source of her bleeding. They were nonmalignant. In March she had another radioisotope bone scan which showed multiple areas of metastasis from her breast cancer.  She also had venous Dopplers of her legs which did not show any DVT.  This was done on 02/04/15. She did not do well on amlodipine.  Caused a lot of swelling.  Exforge is too expensive for her.  When she finishes her samples of Exforge she was switched to plain valsartan 320 mg daily and we will not restart amlodipine  Past Medical History  Diagnosis Date  . Diabetes mellitus   . Hypertension   . Arthritis   . MVP (mitral valve prolapse)   . Hx of breast cancer     RIGHT BREAST  . Hypothyroidism   . Anemia     Past Surgical History  Procedure Laterality Date  . Hemiarthroplasty hip    . Knee arthroscopy      RIGHT KNEE  . Total abdominal hysterectomy w/ bilateral salpingoophorectomy    . Pelvic and para-aortic lymph node dissection    . Tonsillectomy    . Appendectomy    . Cholecystectomy    . US echocardiography  03/30/2009    EF 55-60%  . Cardiovascular stress test  01/28/2008    EF 74%  . Esophagogastroduodenoscopy N/A 07/08/2014    Procedure: ESOPHAGOGASTRODUODENOSCOPY (EGD);  Surgeon: Winfield Cunas., MD;  Location: [facility];  Service: Endoscopy;  Laterality: N/A;  .  Colonoscopy N/A 07/10/2014    Procedure: COLONOSCOPY;  Surgeon: Lear Ng, MD;  Location: [facility];  Service: Endoscopy;  Laterality: N/A;     Current Outpatient Prescriptions  Medication Sig Dispense Refill  . ACCU-CHEK FASTCLIX LANCETS MISC     . anastrozole (ARIMIDEX) 1 MG tablet TAKE 1 TABLET BY MOUTH EVERY DAY 30 tablet 2  . aspirin EC 81 MG tablet Take 81 mg by mouth daily.    Marland Kitchen atenolol (TENORMIN) 50 MG tablet Take 1 tablet (50 mg total) by mouth 2 (two) times daily. 60 tablet 11  . Calcium Carbonate-Vitamin D (CALCIUM + D PO) Take by mouth daily after lunch.     . cholecalciferol (VITAMIN D) 1000 UNITS tablet Take  1,000 Units by mouth daily.    . Cyanocobalamin (VITAMIN B-12 PO) Take by mouth daily.      Marland Kitchen ezetimibe (ZETIA) 10 MG tablet Take 1 tablet (10 mg total) by mouth daily. 30 tablet 11  . Ferrous Fumarate 324 MG TABS Take 1 tablet (106 mg of iron total) by mouth daily. 60 tablet 1  . fluticasone (FLONASE) 50 MCG/ACT nasal spray Place 2 sprays into the nose daily as needed for allergies or rhinitis.     Marland Kitchen glimepiride (AMARYL) 4 MG tablet Take 4 mg by mouth daily with lunch.    . Glucose Blood (FREESTYLE LITE TEST VI) 3 (three) times daily as needed (blood sugar monitoring.).     Marland Kitchen levothyroxine (SYNTHROID, LEVOTHROID) 75 MCG tablet Take 75 mcg by mouth daily.      . metFORMIN (GLUCOPHAGE) 850 MG tablet Take 500 mg by mouth 2 (two) times daily. With lunch and dinner.  08/02/14 Pt states 850mg  made her vision blurry--Pt decreased back to 500 mg    . pantoprazole (PROTONIX) 40 MG tablet Take 40 mg by mouth daily as needed (acid).     . potassium chloride (KLOR-CON M10) 10 MEQ tablet TAKE 1 TABLET (10 MEQ TOTAL) BY MOUTH DAILY. 30 tablet 11   No current facility-administered medications for this visit.    Allergies:   Amlodipine; Codeine; Demerol; Librium; Lipitor; and Metoprolol    Social History:  The patient  reports that she has never smoked. She does not have any smokeless tobacco history on file. She reports that she does not drink alcohol or use illicit drugs.   Family History:  The patient's family history includes Diabetes in her brother, sister, and sister; Heart attack in her brother and father; Hypertension in her brother, father, mother, sister, and sister; Liver cancer in her sister.    ROS:  Please see the history of present illness.   Otherwise, review of systems are positive for none.   All other systems are reviewed and negative.    PHYSICAL EXAM: VS:  BP 148/52 mmHg  Pulse 64  Ht 5\' 6"  (1.676 m)  Wt 203 lb 1.9 oz (92.135 kg)  BMI 32.80 kg/m2 , BMI Body mass index is 32.8  kg/(m^2). GEN: Well nourished, well developed, in no acute distress HEENT: normal Neck: no JVD, carotid bruits, or masses Cardiac: RRR; grade 2/6 systolic murmur at the base, no rub, , or gallops,no edema  Respiratory:  clear to auscultation bilaterally, normal work of breathing GI: soft, nontender, nondistended, + BS MS: no deformity or atrophy Skin: warm and dry, no rash Neuro:  Strength and sensation are intact Psych: euthymic mood, full affect   EKG:  EKG is not ordered today.    Recent Labs: 07/07/2014: TSH  1.430 09/22/2014: ALT 17; BUN 11.4; Creatinine 0.9; Potassium 3.8; Sodium 136 02/07/2015: Hemoglobin 12.5; Platelets 336    Lipid Panel    Component Value Date/Time   CHOL 181 07/07/2014 1407   TRIG 79 07/07/2014 1407   HDL 43 07/07/2014 1407   CHOLHDL 4.2 07/07/2014 1407   VLDL 16 07/07/2014 1407   LDLCALC 122* 07/07/2014 1407      Wt Readings from Last 3 Encounters:  02/18/15 203 lb 1.9 oz (92.135 kg)  02/07/15 201 lb 12.8 oz (91.536 kg)  09/22/14 198 lb 9.6 oz (90.084 kg)        ASSESSMENT AND PLAN:  1. benign hypertension without congestive heart failure 2. left carotid bruit 3. Goiter 4. mild aortic stenosis 5. type 2 diabetes mellitus without mention of complication, uncontrolled 6. breast cancer with prior history of metastasis to bone  Plan: Continue on careful heart healthy diet. Continue same medication. Recheck in 6 months for followup office visit.   Current medicines are reviewed at length with the patient today.  The patient does not have concerns regarding medicines.  The following changes have been made:  no change  Labs/ tests ordered today include:   Orders Placed This Encounter  Procedures  . Lipid panel  . Hepatic function panel  . Basic metabolic panel       Signed, Darlin Coco, MD  02/18/2015 6:22 PM    [facility] [address], [place], [place]  [address] Phone: [phone]; Fax:  [phone]

## 2015-02-18 NOTE — Patient Instructions (Addendum)
Medication Instructions:  WHEN YOU FINISH YOUR EXFORGE RESUME THE VALSARTAN 320 MG DAILY ONLY CALL WHEN YOU FINISH AND WILL UPDATE CHART [phone]  Labwork: NONE  Testing/Procedures: NONE  Follow-Up: Your physician wants you to follow-up in: 6 months with fasting labs (lp/bmet/hfp)  You will receive a reminder letter in the mail two months in advance. If you don't receive a letter, please call our office to schedule the follow-up appointment.  Any Other Special Instructions Will Be Listed Below (If Applicable).

## 2015-02-25 ENCOUNTER — Other Ambulatory Visit: Payer: Self-pay | Admitting: Oncology

## 2015-03-03 ENCOUNTER — Telehealth: Payer: Self-pay | Admitting: Cardiology

## 2015-03-03 NOTE — Telephone Encounter (Signed)
Discuss Valsartan with  Dr. Mare Ferrari tomorrow

## 2015-03-03 NOTE — Telephone Encounter (Signed)
New message      Calling to give Rip Harbour an update on her medications

## 2015-03-04 NOTE — Telephone Encounter (Signed)
Discussed with patient and patient is to go back on Valsartan 320 mg daily, change made in chart

## 2015-03-10 ENCOUNTER — Ambulatory Visit: Payer: Medicare Other | Admitting: Cardiology

## 2015-03-11 ENCOUNTER — Ambulatory Visit
Admission: RE | Admit: 2015-03-11 | Discharge: 2015-03-11 | Disposition: A | Discharge status: Home / Self Care | Payer: Medicare Other | Source: Ambulatory Visit | Attending: Family Medicine | Admitting: Family Medicine

## 2015-03-11 DIAGNOSIS — Z853 Personal history of malignant neoplasm of breast: Secondary | ICD-10-CM

## 2015-03-19 ENCOUNTER — Emergency Department (HOSPITAL_COMMUNITY)
Admission: EM | Admit: 2015-03-19 | Discharge: 2015-03-19 | Disposition: A | Discharge status: Home / Self Care | Payer: Medicare Other | Attending: Emergency Medicine | Admitting: Emergency Medicine

## 2015-03-19 ENCOUNTER — Telehealth: Payer: Self-pay | Admitting: Physician Assistant

## 2015-03-19 ENCOUNTER — Encounter (HOSPITAL_COMMUNITY): Payer: Self-pay | Admitting: *Deleted

## 2015-03-19 DIAGNOSIS — I341 Nonrheumatic mitral (valve) prolapse: Secondary | ICD-10-CM | POA: Insufficient documentation

## 2015-03-19 DIAGNOSIS — R197 Diarrhea, unspecified: Secondary | ICD-10-CM | POA: Diagnosis present

## 2015-03-19 DIAGNOSIS — I10 Essential (primary) hypertension: Secondary | ICD-10-CM | POA: Diagnosis not present

## 2015-03-19 DIAGNOSIS — Z853 Personal history of malignant neoplasm of breast: Secondary | ICD-10-CM | POA: Insufficient documentation

## 2015-03-19 DIAGNOSIS — E039 Hypothyroidism, unspecified: Secondary | ICD-10-CM | POA: Insufficient documentation

## 2015-03-19 DIAGNOSIS — E119 Type 2 diabetes mellitus without complications: Secondary | ICD-10-CM | POA: Diagnosis not present

## 2015-03-19 DIAGNOSIS — D649 Anemia, unspecified: Secondary | ICD-10-CM | POA: Insufficient documentation

## 2015-03-19 DIAGNOSIS — R51 Headache: Secondary | ICD-10-CM | POA: Insufficient documentation

## 2015-03-19 DIAGNOSIS — M1990 Unspecified osteoarthritis, unspecified site: Secondary | ICD-10-CM | POA: Diagnosis not present

## 2015-03-19 DIAGNOSIS — IMO0001 Reserved for inherently not codable concepts without codable children: Secondary | ICD-10-CM

## 2015-03-19 DIAGNOSIS — Z7982 Long term (current) use of aspirin: Secondary | ICD-10-CM | POA: Insufficient documentation

## 2015-03-19 DIAGNOSIS — R109 Unspecified abdominal pain: Secondary | ICD-10-CM | POA: Insufficient documentation

## 2015-03-19 DIAGNOSIS — Z79899 Other long term (current) drug therapy: Secondary | ICD-10-CM | POA: Insufficient documentation

## 2015-03-19 DIAGNOSIS — R030 Elevated blood-pressure reading, without diagnosis of hypertension: Secondary | ICD-10-CM | POA: Diagnosis not present

## 2015-03-19 LAB — CBC
HEMATOCRIT: 38.1 % (ref 36.0–46.0)
Hemoglobin: 12.4 g/dL (ref 12.0–15.0)
MCH: 29.4 pg (ref 26.0–34.0)
MCHC: 32.5 g/dL (ref 30.0–36.0)
MCV: 90.3 fL (ref 78.0–100.0)
PLATELETS: 314 10*3/uL (ref 150–400)
RBC: 4.22 MIL/uL (ref 3.87–5.11)
RDW: 13.4 % (ref 11.5–15.5)
WBC: 21.6 10*3/uL — ABNORMAL HIGH (ref 4.0–10.5)

## 2015-03-19 LAB — COMPREHENSIVE METABOLIC PANEL
ALK PHOS: 73 U/L (ref 38–126)
ALT: 22 U/L (ref 14–54)
AST: 22 U/L (ref 15–41)
Albumin: 3.3 g/dL — ABNORMAL LOW (ref 3.5–5.0)
Anion gap: 12 (ref 5–15)
BUN: 12 mg/dL (ref 6–20)
CALCIUM: 8.9 mg/dL (ref 8.9–10.3)
CO2: 22 mmol/L (ref 22–32)
Chloride: 101 mmol/L (ref 101–111)
Creatinine, Ser: 0.91 mg/dL (ref 0.44–1.00)
GFR calc non Af Amer: 54 mL/min — ABNORMAL LOW (ref >60)
GLUCOSE: 223 mg/dL — AB (ref 70–99)
POTASSIUM: 3.5 mmol/L (ref 3.5–5.1)
SODIUM: 135 mmol/L (ref 135–145)
Total Bilirubin: 0.5 mg/dL (ref 0.3–1.2)
Total Protein: 6.3 g/dL — ABNORMAL LOW (ref 6.5–8.1)

## 2015-03-19 LAB — URINE MICROSCOPIC-ADD ON

## 2015-03-19 LAB — URINALYSIS, ROUTINE W REFLEX MICROSCOPIC
Bilirubin Urine: NEGATIVE
Glucose, UA: 100 mg/dL — AB
Ketones, ur: NEGATIVE mg/dL
NITRITE: NEGATIVE
PH: 6 (ref 5.0–8.0)
Protein, ur: 100 mg/dL — AB
Specific Gravity, Urine: 1.019 (ref 1.005–1.030)
Urobilinogen, UA: 0.2 mg/dL (ref 0.0–1.0)

## 2015-03-19 LAB — LIPASE, BLOOD: LIPASE: 23 U/L (ref 22–51)

## 2015-03-19 LAB — I-STAT TROPONIN, ED: TROPONIN I, POC: 0 ng/mL (ref 0.00–0.08)

## 2015-03-19 MED ORDER — HYDRALAZINE HCL 20 MG/ML IJ SOLN
10.0000 mg | Freq: Once | INTRAMUSCULAR | Status: AC
  Administered 2015-03-19: 10 mg via INTRAVENOUS
  Filled 2015-03-19: qty 1

## 2015-03-19 MED ORDER — CLONIDINE HCL 0.1 MG PO TABS
0.1000 mg | ORAL_TABLET | Freq: Once | ORAL | Status: AC
  Administered 2015-03-19: 0.1 mg via ORAL
  Filled 2015-03-19: qty 1

## 2015-03-19 NOTE — Telephone Encounter (Addendum)
Ms. Swarthout called because her mother's blood pressure was very high, greater than 629 systolic. It has been running high for the last 2 days. Her blood sugar was up today at 178 as well.  Ms Deshea Pooley has also been struggling with diarrhea, not resting well, and had a miserable night last night. She states she cannot go through another night like that.  Advised the daughter that the best option is probably to bring her to the emergency room where they can get her blood pressure under control, treat her GI symptoms and make sure there are no other issues going on. The daughter was in agreement and stated she would bring her mother to [facility].

## 2015-03-19 NOTE — ED Notes (Signed)
Pt and family member reports pt having diarrhea x 2 days, having hyperglycemia and HTN. Reports taking meds as prescribed. pts only complaints are abd pain, headache and fatigue.

## 2015-03-19 NOTE — ED Notes (Signed)
Pt. Is aware of needing a stool specimen.  No stool specimens noted this shift.

## 2015-03-19 NOTE — ED Provider Notes (Signed)
CSN: [id number]     Arrival date & time 03/19/15  1254 History   First MD Initiated Contact with Patient 03/19/15 1333     Chief Complaint  Patient presents with  . Hypertension  . Diarrhea     (Consider location/radiation/quality/duration/timing/severity/associated sxs/prior Treatment) HPI Comments: Patient presents today with two separate complaints.  She is complaining of hypertension and also diarrhea.  She states that the diarrhea has been present for the past 2 days.  She reports 6-7 episodes of diarrhea yesterday and 3-4 episodes today.  No blood or mucous in her stool.  She reports mild associated diffuse abdominal cramping.  She denies nausea, vomiting, fever, chills, or urinary symptoms.  Patient also reports that her blood pressure has been elevated since last evening.  She reports that she is taking her antihypertensives as prescribed.  She reports a mild headache.  Denies chest pain, focal weakness, numbness, tingling, vision changes, difficulty swallowing, or difficulty speaking.    The history is provided by the patient.    Past Medical History  Diagnosis Date  . Diabetes mellitus   . Hypertension   . Arthritis   . MVP (mitral valve prolapse)   . Hx of breast cancer     RIGHT BREAST  . Hypothyroidism   . Anemia    Past Surgical History  Procedure Laterality Date  . Hemiarthroplasty hip    . Knee arthroscopy      RIGHT KNEE  . Total abdominal hysterectomy w/ bilateral salpingoophorectomy    . Pelvic and para-aortic lymph node dissection    . Tonsillectomy    . Appendectomy    . Cholecystectomy    . US echocardiography  03/30/2009    EF 55-60%  . Cardiovascular stress test  01/28/2008    EF 74%  . Esophagogastroduodenoscopy N/A 07/08/2014    Procedure: ESOPHAGOGASTRODUODENOSCOPY (EGD);  Surgeon: Winfield Cunas., MD;  Location: [facility];  Service: Endoscopy;  Laterality: N/A;  . Colonoscopy N/A 07/10/2014    Procedure: COLONOSCOPY;  Surgeon: Lear Ng, MD;  Location: [facility];  Service: Endoscopy;  Laterality: N/A;   Family History  Problem Relation Age of Onset  . Hypertension Mother   . Heart attack Father   . Hypertension Father   . Diabetes Sister   . Hypertension Sister   . Liver cancer Sister   . Heart attack Brother   . Hypertension Brother   . Diabetes Brother   . Diabetes Sister   . Hypertension Sister    History  Substance Use Topics  . Smoking status: Never Smoker   . Smokeless tobacco: Not on file  . Alcohol Use: No   OB History    No data available     Review of Systems  All other systems reviewed and are negative.     Allergies  Amlodipine; Codeine; Demerol; Librium; Lipitor; and Metoprolol  Home Medications   Prior to Admission medications   Medication Sig Start Date End Date Taking? Authorizing Provider  ACCU-CHEK FASTCLIX LANCETS [place]  04/28/14   Historical Provider, MD  anastrozole (ARIMIDEX) 1 MG tablet TAKE 1 TABLET BY MOUTH EVERY DAY 02/28/15   Ladell Pier, MD  aspirin EC 81 MG tablet Take 81 mg by mouth daily.    Historical Provider, MD  atenolol (TENORMIN) 50 MG tablet Take 1 tablet (50 mg total) by mouth 2 (two) times daily. 09/07/14   Darlin Coco, MD  Calcium Carbonate-Vitamin D (CALCIUM + D PO) Take by mouth daily  after lunch.     Historical Provider, MD  cholecalciferol (VITAMIN D) 1000 UNITS tablet Take 1,000 Units by mouth daily.    Historical Provider, MD  Cyanocobalamin (VITAMIN B-12 PO) Take by mouth daily.      Historical Provider, MD  ezetimibe (ZETIA) 10 MG tablet Take 1 tablet (10 mg total) by mouth daily. 09/07/14   Darlin Coco, MD  FERROCITE 324 MG TABS TAKE 1 TABLET (106 MG OF IRON TOTAL) BY MOUTH DAILY. 02/28/15   Ladell Pier, MD  fluticasone (FLONASE) 50 MCG/ACT nasal spray Place 2 sprays into the nose daily as needed for allergies or rhinitis.  11/26/11   Historical Provider, MD  glimepiride (AMARYL) 4 MG tablet Take 4 mg by mouth daily with  lunch.    Historical Provider, MD  Glucose Blood (FREESTYLE LITE TEST VI) 3 (three) times daily as needed (blood sugar monitoring.).  08/27/13   Historical Provider, MD  levothyroxine (SYNTHROID, LEVOTHROID) 75 MCG tablet Take 75 mcg by mouth daily.      Historical Provider, MD  metFORMIN (GLUCOPHAGE) 850 MG tablet Take 500 mg by mouth 2 (two) times daily. With lunch and dinner.  08/02/14 Pt states 850mg  made her vision blurry--Pt decreased back to 500 mg 07/11/14   Janece Canterbury, MD  pantoprazole (PROTONIX) 40 MG tablet Take 40 mg by mouth daily as needed (acid).     Historical Provider, MD  potassium chloride (KLOR-CON M10) 10 MEQ tablet TAKE 1 TABLET (10 MEQ TOTAL) BY MOUTH DAILY. 09/07/14   Darlin Coco, MD  valsartan (DIOVAN) 320 MG tablet Take 320 mg by mouth daily.    Historical Provider, MD   BP 238/71 mmHg  Pulse 62  Temp(Src) 97.7 F (36.5 C) (Oral)  Resp 19  SpO2 98% Physical Exam  Constitutional: She appears well-developed and well-nourished.  HENT:  Head: Normocephalic and atraumatic.  Mouth/Throat: Oropharynx is clear and moist.  Eyes: EOM are normal. Pupils are equal, round, and reactive to light.  Neck: Normal range of motion. Neck supple.  Cardiovascular: Normal rate, regular rhythm and normal heart sounds.   Pulmonary/Chest: Effort normal and breath sounds normal.  Abdominal: Soft. Bowel sounds are normal. She exhibits no distension and no mass. There is no tenderness. There is no rebound and no guarding.  Musculoskeletal: Normal range of motion.  Neurological: She is alert. She has normal strength. No cranial nerve deficit or sensory deficit. Coordination and gait normal.  Skin: Skin is warm and dry.  Psychiatric: She has a normal mood and affect.  Nursing note and vitals reviewed.   ED Course  Procedures (including critical care time) Labs Review Labs Reviewed  CBC  COMPREHENSIVE METABOLIC PANEL  LIPASE, BLOOD  URINALYSIS, ROUTINE W REFLEX MICROSCOPIC   I-STAT TROPOININ, ED    Imaging Review No results found.   EKG Interpretation   Date/Time:  Saturday Mar 19 2015 13:26:51 EDT Ventricular Rate:  63 PR Interval:  170 QRS Duration: 132 QT Interval:  488 QTC Calculation: 499 R Axis:     Text Interpretation:  Normal sinus rhythm Right bundle branch block  Minimal voltage criteria for LVH, may be normal variant Abnormal ECG Sinus  rhythm Non-specific intra-ventricular conduction delay T wave abnormality  Abnormal ekg Confirmed by Carmin Muskrat  MD ([id number]) on 03/19/2015 1:57:31  PM     Filed Vitals:   03/19/15 1628  BP: 186/60  Pulse: 60  Temp:   Resp: 14    MDM   Final diagnoses:  None  Patient presents today with complaints of diarrhea and hypertension.  She denies neurological deficits, chest pain, or vision changes.  Normal neurological exam.  Labs unremarkable aside from leukocytosis, which appears to be chronic.  Abdomen soft and non tender.  No vomiting.  Tolerating PO liquids.  Blood pressure improved after given Clonidine and Hydralazine.  No diarrhea while in the ED.  Feel that the patient is stable for discharge.  Instructed to follow up with PCP.  Return precautions given.  Patient also evaluated by Dr. Vanita Panda who is in agreement with the plan.    Hyman Bible, PA-C 03/21/15 2236  Carmin Muskrat, MD 03/23/15 [phone]

## 2015-03-21 ENCOUNTER — Telehealth: Payer: Self-pay | Admitting: Cardiology

## 2015-03-21 NOTE — Telephone Encounter (Signed)
Spoke with patient's daughter, [name], who called to report that patient's BP is elevated again today; pt was seen in the ED on Saturday for systolic BP > 621 mmHg.  [name] states the patient was given Clonidine and Hydralazine in the ED which brought patient's systolic pressure down to the 180's.  [name] states that the patient's BP has been elevated since stopping ExForge and starting Valsartan.  The change was made due to cost per pt request at last ov with Dr. Mare Ferrari 4/16.  [name] states patient's BP was once again elevated when the patient woke up this morning.  States patient c/o "feeling wiped out."  [name] gave patient an Exforge left over from previous Rx this morning and BP has decreased.  I advised that Dr. Mare Ferrari and his primary nurse, Rip Harbour, are out of the office today and that I will route message for permission to restart Exforge.  [name] states she has 2 additional Exforge pills and she will give patient another one tomorrow because she believes this is helping more than Valsartan.  I advised [name] to call back if patient experiences worsening symptoms or if BP increases.  [name] verbalized understanding and agreement.

## 2015-03-21 NOTE — Telephone Encounter (Signed)
Okay to go back on her previous dose of Exforge.

## 2015-03-21 NOTE — Telephone Encounter (Signed)
New message      Pt c/o BP issue: STAT if pt c/o blurred vision, one-sided weakness or slurred speech  1. What are your last 5 BP readings? 8:10am 230/89 pulse 71, 8:50am 210/67 pulse 69, 9:25am 208/76 pulse 70  2. Are you having any other symptoms (ex. Dizziness, headache, blurred vision, passed out)? Blurred vision (pt also has diabetes and sugar was high) 3. What is your BP issue? Daughter gave pt exforge this am---put dr had taken off this medication.  They had some pills left.  Pt also went to ER sat am because of high bp Daughter want a presc for exforge called to [facility] at [facility] call

## 2015-03-22 MED ORDER — AMLODIPINE BESYLATE-VALSARTAN 10-320 MG PO TABS: 1.0000 | ORAL_TABLET | Freq: Every day | ORAL | Status: DC

## 2015-03-22 NOTE — Telephone Encounter (Signed)
Advised daughter and sent Rx to pharmacy

## 2015-03-28 ENCOUNTER — Telehealth: Payer: Self-pay

## 2015-03-28 NOTE — Telephone Encounter (Signed)
Patient requesting Coumadin refill. [facility] sent to [facility].

## 2015-03-28 NOTE — Telephone Encounter (Signed)
Prior auth request sent to [facility] for brand name Exforge [id number] via Cover My Meds.

## 2015-03-29 ENCOUNTER — Other Ambulatory Visit: Payer: Self-pay

## 2015-03-29 ENCOUNTER — Telehealth: Payer: Self-pay | Admitting: Cardiology

## 2015-03-29 NOTE — Telephone Encounter (Signed)
Follow up  ERROR. Sent the call to refills Vaughan Basta in refills called the pt//sr

## 2015-03-29 NOTE — Telephone Encounter (Signed)
Phone call from patient stating she got a call from Insurance [employer] saying Exforge was approved. Rx was already sent to pharmacy, but I will confirm it is brand only.

## 2015-03-31 ENCOUNTER — Telehealth: Payer: Self-pay

## 2015-03-31 NOTE — Telephone Encounter (Signed)
Addendum on prior auth # [id number].

## 2015-03-31 NOTE — Telephone Encounter (Signed)
Received fax from [employer] with approval for brand Exforge [id number] through 11/12/2015. Patient informed.

## 2015-04-20 ENCOUNTER — Ambulatory Visit: Payer: Medicare Other | Admitting: Cardiology

## 2015-05-11 ENCOUNTER — Emergency Department (HOSPITAL_COMMUNITY): Payer: Medicare Other

## 2015-05-11 ENCOUNTER — Encounter (HOSPITAL_COMMUNITY): Payer: Self-pay | Admitting: Emergency Medicine

## 2015-05-11 ENCOUNTER — Inpatient Hospital Stay (HOSPITAL_COMMUNITY)
Admission: EM | Admit: 2015-05-11 | Discharge: 2015-05-14 | DRG: 689 | Disposition: A | Discharge status: Home / Self Care | Payer: Medicare Other | Attending: Internal Medicine | Admitting: Internal Medicine

## 2015-05-11 DIAGNOSIS — E1165 Type 2 diabetes mellitus with hyperglycemia: Secondary | ICD-10-CM | POA: Diagnosis present

## 2015-05-11 DIAGNOSIS — E871 Hypo-osmolality and hyponatremia: Secondary | ICD-10-CM | POA: Diagnosis present

## 2015-05-11 DIAGNOSIS — E039 Hypothyroidism, unspecified: Secondary | ICD-10-CM | POA: Diagnosis present

## 2015-05-11 DIAGNOSIS — C9110 Chronic lymphocytic leukemia of B-cell type not having achieved remission: Secondary | ICD-10-CM | POA: Diagnosis present

## 2015-05-11 DIAGNOSIS — Z72820 Sleep deprivation: Secondary | ICD-10-CM

## 2015-05-11 DIAGNOSIS — M1990 Unspecified osteoarthritis, unspecified site: Secondary | ICD-10-CM | POA: Diagnosis present

## 2015-05-11 DIAGNOSIS — G9340 Encephalopathy, unspecified: Secondary | ICD-10-CM | POA: Diagnosis present

## 2015-05-11 DIAGNOSIS — E876 Hypokalemia: Secondary | ICD-10-CM | POA: Diagnosis not present

## 2015-05-11 DIAGNOSIS — R911 Solitary pulmonary nodule: Secondary | ICD-10-CM | POA: Diagnosis present

## 2015-05-11 DIAGNOSIS — R531 Weakness: Secondary | ICD-10-CM | POA: Diagnosis present

## 2015-05-11 DIAGNOSIS — I10 Essential (primary) hypertension: Secondary | ICD-10-CM | POA: Diagnosis not present

## 2015-05-11 DIAGNOSIS — I119 Hypertensive heart disease without heart failure: Secondary | ICD-10-CM | POA: Diagnosis present

## 2015-05-11 DIAGNOSIS — Z9049 Acquired absence of other specified parts of digestive tract: Secondary | ICD-10-CM | POA: Diagnosis present

## 2015-05-11 DIAGNOSIS — R55 Syncope and collapse: Secondary | ICD-10-CM | POA: Diagnosis not present

## 2015-05-11 DIAGNOSIS — Z6835 Body mass index (BMI) 35.0-35.9, adult: Secondary | ICD-10-CM

## 2015-05-11 DIAGNOSIS — Z7982 Long term (current) use of aspirin: Secondary | ICD-10-CM

## 2015-05-11 DIAGNOSIS — Z8673 Personal history of transient ischemic attack (TIA), and cerebral infarction without residual deficits: Secondary | ICD-10-CM

## 2015-05-11 DIAGNOSIS — N390 Urinary tract infection, site not specified: Principal | ICD-10-CM | POA: Diagnosis present

## 2015-05-11 DIAGNOSIS — C50919 Malignant neoplasm of unspecified site of unspecified female breast: Secondary | ICD-10-CM | POA: Diagnosis present

## 2015-05-11 DIAGNOSIS — E860 Dehydration: Secondary | ICD-10-CM | POA: Diagnosis present

## 2015-05-11 DIAGNOSIS — I341 Nonrheumatic mitral (valve) prolapse: Secondary | ICD-10-CM | POA: Diagnosis present

## 2015-05-11 DIAGNOSIS — E669 Obesity, unspecified: Secondary | ICD-10-CM | POA: Diagnosis present

## 2015-05-11 DIAGNOSIS — I350 Nonrheumatic aortic (valve) stenosis: Secondary | ICD-10-CM

## 2015-05-11 DIAGNOSIS — E780 Pure hypercholesterolemia: Secondary | ICD-10-CM | POA: Diagnosis present

## 2015-05-11 DIAGNOSIS — Z90710 Acquired absence of both cervix and uterus: Secondary | ICD-10-CM

## 2015-05-11 DIAGNOSIS — E1142 Type 2 diabetes mellitus with diabetic polyneuropathy: Secondary | ICD-10-CM | POA: Diagnosis present

## 2015-05-11 DIAGNOSIS — C7951 Secondary malignant neoplasm of bone: Secondary | ICD-10-CM

## 2015-05-11 DIAGNOSIS — N3000 Acute cystitis without hematuria: Secondary | ICD-10-CM

## 2015-05-11 HISTORY — DX: Pure hypercholesterolemia, unspecified: E78.00

## 2015-05-11 HISTORY — DX: Malignant neoplasm of unspecified site of right female breast: C50.911

## 2015-05-11 HISTORY — DX: Type 2 diabetes mellitus without complications: E11.9

## 2015-05-11 HISTORY — DX: Cardiac murmur, unspecified: R01.1

## 2015-05-11 LAB — RAPID URINE DRUG SCREEN, HOSP PERFORMED
Amphetamines: NOT DETECTED
Barbiturates: NOT DETECTED
Benzodiazepines: NOT DETECTED
Cocaine: NOT DETECTED
OPIATES: NOT DETECTED
Tetrahydrocannabinol: NOT DETECTED

## 2015-05-11 LAB — COMPREHENSIVE METABOLIC PANEL
ALBUMIN: 3.5 g/dL (ref 3.5–5.0)
ALT: 22 U/L (ref 14–54)
ANION GAP: 12 (ref 5–15)
AST: 26 U/L (ref 15–41)
Alkaline Phosphatase: 82 U/L (ref 38–126)
BUN: 10 mg/dL (ref 6–20)
CALCIUM: 9 mg/dL (ref 8.9–10.3)
CO2: 20 mmol/L — ABNORMAL LOW (ref 22–32)
CREATININE: 0.86 mg/dL (ref 0.44–1.00)
Chloride: 101 mmol/L (ref 101–111)
GFR calc Af Amer: >60 mL/min (ref >60)
GFR calc non Af Amer: 58 mL/min — ABNORMAL LOW (ref >60)
Glucose, Bld: 254 mg/dL — ABNORMAL HIGH (ref 65–99)
POTASSIUM: 3.7 mmol/L (ref 3.5–5.1)
Sodium: 133 mmol/L — ABNORMAL LOW (ref 135–145)
TOTAL PROTEIN: 6.5 g/dL (ref 6.5–8.1)
Total Bilirubin: 0.4 mg/dL (ref 0.3–1.2)

## 2015-05-11 LAB — DIFFERENTIAL
Basophils Absolute: 0 10*3/uL (ref 0.0–0.1)
Basophils Relative: 0 % (ref 0–1)
EOS ABS: 0 10*3/uL (ref 0.0–0.7)
Eosinophils Relative: 0 % (ref 0–5)
Lymphocytes Relative: 38 % (ref 12–46)
Lymphs Abs: 8 10*3/uL — ABNORMAL HIGH (ref 0.7–4.0)
MONO ABS: 0.6 10*3/uL (ref 0.1–1.0)
Monocytes Relative: 3 % (ref 3–12)
NEUTROS PCT: 59 % (ref 43–77)
Neutro Abs: 12.4 10*3/uL — ABNORMAL HIGH (ref 1.7–7.7)

## 2015-05-11 LAB — URINALYSIS, ROUTINE W REFLEX MICROSCOPIC
Bilirubin Urine: NEGATIVE
Glucose, UA: 250 mg/dL — AB
KETONES UR: NEGATIVE mg/dL
Nitrite: POSITIVE — AB
PH: 6 (ref 5.0–8.0)
Protein, ur: 100 mg/dL — AB
SPECIFIC GRAVITY, URINE: 1.02 (ref 1.005–1.030)
Urobilinogen, UA: 0.2 mg/dL (ref 0.0–1.0)

## 2015-05-11 LAB — URINE MICROSCOPIC-ADD ON

## 2015-05-11 LAB — CBC
HCT: 40.4 % (ref 36.0–46.0)
Hemoglobin: 13.4 g/dL (ref 12.0–15.0)
MCH: 28.9 pg (ref 26.0–34.0)
MCHC: 33.2 g/dL (ref 30.0–36.0)
MCV: 87.1 fL (ref 78.0–100.0)
PLATELETS: 325 10*3/uL (ref 150–400)
RBC: 4.64 MIL/uL (ref 3.87–5.11)
RDW: 12.8 % (ref 11.5–15.5)
WBC: 21 10*3/uL — ABNORMAL HIGH (ref 4.0–10.5)

## 2015-05-11 LAB — GLUCOSE, CAPILLARY
GLUCOSE-CAPILLARY: 183 mg/dL — AB (ref 65–99)
GLUCOSE-CAPILLARY: 207 mg/dL — AB (ref 65–99)

## 2015-05-11 LAB — I-STAT TROPONIN, ED: Troponin i, poc: 0 ng/mL (ref 0.00–0.08)

## 2015-05-11 LAB — PROTIME-INR
INR: 1.1 (ref 0.00–1.49)
PROTHROMBIN TIME: 14.4 s (ref 11.6–15.2)

## 2015-05-11 LAB — APTT: aPTT: 33 seconds (ref 24–37)

## 2015-05-11 MED ORDER — DEXTROSE 5 % IV SOLN
1.0000 g | Freq: Once | INTRAVENOUS | Status: AC
  Administered 2015-05-11: 1 g via INTRAVENOUS
  Filled 2015-05-11: qty 10

## 2015-05-11 MED ORDER — ADULT MULTIVITAMIN W/MINERALS CH
1.0000 | ORAL_TABLET | Freq: Every day | ORAL | Status: DC
  Administered 2015-05-11 – 2015-05-14 (×4): 1 via ORAL
  Filled 2015-05-11 (×4): qty 1

## 2015-05-11 MED ORDER — GLIMEPIRIDE 4 MG PO TABS
4.0000 mg | ORAL_TABLET | Freq: Every day | ORAL | Status: DC
  Administered 2015-05-11 – 2015-05-14 (×4): 4 mg via ORAL
  Filled 2015-05-11 (×4): qty 1

## 2015-05-11 MED ORDER — ONDANSETRON HCL 4 MG PO TABS: 4.0000 mg | ORAL_TABLET | Freq: Four times a day (QID) | ORAL | Status: DC | PRN

## 2015-05-11 MED ORDER — DOCUSATE SODIUM 100 MG PO CAPS
100.0000 mg | ORAL_CAPSULE | Freq: Two times a day (BID) | ORAL | Status: DC
  Administered 2015-05-11 – 2015-05-13 (×4): 100 mg via ORAL
  Filled 2015-05-11 (×6): qty 1

## 2015-05-11 MED ORDER — INSULIN ASPART 100 UNIT/ML ~~LOC~~ SOLN
0.0000 [IU] | Freq: Three times a day (TID) | SUBCUTANEOUS | Status: DC
  Administered 2015-05-11: 2 [IU] via SUBCUTANEOUS
  Administered 2015-05-12: 3 [IU] via SUBCUTANEOUS
  Administered 2015-05-12: 2 [IU] via SUBCUTANEOUS
  Administered 2015-05-12 – 2015-05-13 (×2): 3 [IU] via SUBCUTANEOUS
  Administered 2015-05-13: 2 [IU] via SUBCUTANEOUS
  Administered 2015-05-13: 3 [IU] via SUBCUTANEOUS
  Administered 2015-05-14 (×2): 2 [IU] via SUBCUTANEOUS

## 2015-05-11 MED ORDER — LEVOTHYROXINE SODIUM 50 MCG PO TABS
75.0000 ug | ORAL_TABLET | Freq: Every day | ORAL | Status: DC
  Administered 2015-05-12 – 2015-05-14 (×3): 75 ug via ORAL
  Filled 2015-05-11 (×6): qty 1

## 2015-05-11 MED ORDER — FLUTICASONE PROPIONATE 50 MCG/ACT NA SUSP
2.0000 | Freq: Every day | NASAL | Status: DC | PRN
  Filled 2015-05-11: qty 16

## 2015-05-11 MED ORDER — EZETIMIBE 10 MG PO TABS
10.0000 mg | ORAL_TABLET | Freq: Every day | ORAL | Status: DC
  Administered 2015-05-11 – 2015-05-13 (×3): 10 mg via ORAL
  Filled 2015-05-11 (×5): qty 1

## 2015-05-11 MED ORDER — PANTOPRAZOLE SODIUM 40 MG PO TBEC: 40.0000 mg | DELAYED_RELEASE_TABLET | Freq: Every day | ORAL | Status: DC | PRN

## 2015-05-11 MED ORDER — ACETAMINOPHEN 650 MG RE SUPP: 650.0000 mg | Freq: Four times a day (QID) | RECTAL | Status: DC | PRN

## 2015-05-11 MED ORDER — VITAMIN B-1 100 MG PO TABS
100.0000 mg | ORAL_TABLET | Freq: Every day | ORAL | Status: DC
  Administered 2015-05-11 – 2015-05-14 (×4): 100 mg via ORAL
  Filled 2015-05-11 (×4): qty 1

## 2015-05-11 MED ORDER — ALUM & MAG HYDROXIDE-SIMETH 200-200-20 MG/5ML PO SUSP
30.0000 mL | Freq: Four times a day (QID) | ORAL | Status: DC | PRN
Start: 2015-05-11 — End: 2015-05-14

## 2015-05-11 MED ORDER — ACETAMINOPHEN 325 MG PO TABS
650.0000 mg | ORAL_TABLET | Freq: Four times a day (QID) | ORAL | Status: DC | PRN
Start: 2015-05-11 — End: 2015-05-14
  Administered 2015-05-13: 650 mg via ORAL
  Filled 2015-05-11: qty 2

## 2015-05-11 MED ORDER — ATENOLOL 50 MG PO TABS
50.0000 mg | ORAL_TABLET | Freq: Two times a day (BID) | ORAL | Status: DC
  Administered 2015-05-11 – 2015-05-14 (×6): 50 mg via ORAL
  Filled 2015-05-11 (×6): qty 1

## 2015-05-11 MED ORDER — ONDANSETRON HCL 4 MG/2ML IJ SOLN
4.0000 mg | Freq: Four times a day (QID) | INTRAMUSCULAR | Status: DC | PRN
  Administered 2015-05-13: 4 mg via INTRAVENOUS
  Filled 2015-05-11: qty 2

## 2015-05-11 MED ORDER — IRBESARTAN 300 MG PO TABS
300.0000 mg | ORAL_TABLET | Freq: Every day | ORAL | Status: DC
  Administered 2015-05-12 – 2015-05-14 (×3): 300 mg via ORAL
  Filled 2015-05-11 (×3): qty 1

## 2015-05-11 MED ORDER — ENSURE ENLIVE PO LIQD
237.0000 mL | Freq: Two times a day (BID) | ORAL | Status: DC
  Administered 2015-05-12: 237 mL via ORAL

## 2015-05-11 MED ORDER — POTASSIUM CHLORIDE CRYS ER 10 MEQ PO TBCR
10.0000 meq | EXTENDED_RELEASE_TABLET | Freq: Every day | ORAL | Status: DC
  Administered 2015-05-11 – 2015-05-13 (×3): 10 meq via ORAL
  Filled 2015-05-11 (×3): qty 1

## 2015-05-11 MED ORDER — AMLODIPINE BESYLATE 10 MG PO TABS
10.0000 mg | ORAL_TABLET | Freq: Every day | ORAL | Status: DC
  Administered 2015-05-12 – 2015-05-14 (×3): 10 mg via ORAL
  Filled 2015-05-11 (×4): qty 1

## 2015-05-11 MED ORDER — ENOXAPARIN SODIUM 40 MG/0.4ML ~~LOC~~ SOLN
40.0000 mg | SUBCUTANEOUS | Status: DC
  Administered 2015-05-11 – 2015-05-13 (×3): 40 mg via SUBCUTANEOUS
  Filled 2015-05-11 (×3): qty 0.4

## 2015-05-11 MED ORDER — SODIUM CHLORIDE 0.9 % IV SOLN
INTRAVENOUS | Status: DC
  Administered 2015-05-11 – 2015-05-12 (×2): via INTRAVENOUS

## 2015-05-11 MED ORDER — CEFTRIAXONE SODIUM IN DEXTROSE 20 MG/ML IV SOLN
1.0000 g | INTRAVENOUS | Status: DC
  Administered 2015-05-12 – 2015-05-13 (×2): 1 g via INTRAVENOUS
  Filled 2015-05-11 (×3): qty 50

## 2015-05-11 MED ORDER — AMLODIPINE BESYLATE-VALSARTAN 10-320 MG PO TABS: 1.0000 | ORAL_TABLET | Freq: Every day | ORAL | Status: DC

## 2015-05-11 MED ORDER — INSULIN ASPART 100 UNIT/ML ~~LOC~~ SOLN
0.0000 [IU] | Freq: Every day | SUBCUTANEOUS | Status: DC
  Administered 2015-05-11: 2 [IU] via SUBCUTANEOUS

## 2015-05-11 MED ORDER — ONDANSETRON HCL 4 MG/2ML IJ SOLN: 4.0000 mg | Freq: Three times a day (TID) | INTRAMUSCULAR | Status: DC | PRN

## 2015-05-11 MED ORDER — FOLIC ACID 1 MG PO TABS
1.0000 mg | ORAL_TABLET | Freq: Every day | ORAL | Status: DC
  Administered 2015-05-11 – 2015-05-14 (×4): 1 mg via ORAL
  Filled 2015-05-11 (×4): qty 1

## 2015-05-11 MED ORDER — ANASTROZOLE 1 MG PO TABS
1.0000 mg | ORAL_TABLET | Freq: Every day | ORAL | Status: DC
  Administered 2015-05-11 – 2015-05-14 (×4): 1 mg via ORAL
  Filled 2015-05-11 (×4): qty 1

## 2015-05-11 MED ORDER — ASPIRIN EC 81 MG PO TBEC
81.0000 mg | DELAYED_RELEASE_TABLET | Freq: Every day | ORAL | Status: DC
  Administered 2015-05-11 – 2015-05-14 (×4): 81 mg via ORAL
  Filled 2015-05-11 (×4): qty 1

## 2015-05-11 NOTE — ED Notes (Signed)
Patient's family member states patient is not as conversational as usual.  Patient states she feels tired and just wants to sleep.

## 2015-05-11 NOTE — ED Provider Notes (Signed)
CSN: [id number]     Arrival date & time 05/11/15  1014 History   First MD Initiated Contact with Patient 05/11/15 1017     Chief Complaint  Patient presents with  . Weakness    (Consider location/radiation/quality/duration/timing/severity/associated sxs/prior Treatment) HPI Comments: Patients with history of TIA in the 1980s that presented as unilateral numbness, diabetes, hypertension -- presents with family member with complaint of change in behavior, generalized weakness, generalized fatigue, possible right-sided facial droop. Patient's family member states that the patient has just not been herself over the past 2 days. Typically she is more interactive and conversational. She notes that the right side of her face appears to be slightly drawn. Otherwise no speech changes, confusion, change in ambulation, difficulty with balance. Patient states that she feels fatigued and denies unilateral weakness, numbness. Patient was recently being treated for a urinary tract infection but decided that she did not want to take the medicine anymore so she stopped early. She has had intermittent headache. No fevers, URI symptoms, nausea, vomiting. Patient did have an episode of diarrhea this morning that was nonbloody. No current urinary symptoms. No leg swelling or skin rashes. No treatments prior to arrival. Onset of symptoms acute. Course is constant. Nothing makes symptoms better or worse.  Patient is a 81 y.o. female presenting with weakness. The history is provided by the patient and a relative.  Weakness Associated symptoms include fatigue, headaches and weakness. Pertinent negatives include no abdominal pain, chest pain, congestion, coughing, fever, nausea, neck pain, numbness, rash or vomiting.    Past Medical History  Diagnosis Date  . Diabetes mellitus   . Hypertension   . Arthritis   . MVP (mitral valve prolapse)   . Hx of breast cancer     RIGHT BREAST  . Hypothyroidism   . Anemia    Past  Surgical History  Procedure Laterality Date  . Hemiarthroplasty hip    . Knee arthroscopy      RIGHT KNEE  . Total abdominal hysterectomy w/ bilateral salpingoophorectomy    . Pelvic and para-aortic lymph node dissection    . Tonsillectomy    . Appendectomy    . Cholecystectomy    . US echocardiography  03/30/2009    EF 55-60%  . Cardiovascular stress test  01/28/2008    EF 74%  . Esophagogastroduodenoscopy N/A 07/08/2014    Procedure: ESOPHAGOGASTRODUODENOSCOPY (EGD);  Surgeon: Winfield Cunas., MD;  Location: [facility];  Service: Endoscopy;  Laterality: N/A;  . Colonoscopy N/A 07/10/2014    Procedure: COLONOSCOPY;  Surgeon: Lear Ng, MD;  Location: [facility];  Service: Endoscopy;  Laterality: N/A;   Family History  Problem Relation Age of Onset  . Hypertension Mother   . Heart attack Father   . Hypertension Father   . Diabetes Sister   . Hypertension Sister   . Liver cancer Sister   . Heart attack Brother   . Hypertension Brother   . Diabetes Brother   . Diabetes Sister   . Hypertension Sister    History  Substance Use Topics  . Smoking status: Never Smoker   . Smokeless tobacco: Not on file  . Alcohol Use: No   OB History    No data available     Review of Systems  Constitutional: Positive for fatigue. Negative for fever.  HENT: Negative for congestion, dental problem, rhinorrhea and sinus pressure.   Eyes: Negative for photophobia, discharge, redness and visual disturbance.  Respiratory: Negative for cough and shortness  of breath.   Cardiovascular: Negative for chest pain.  Gastrointestinal: Positive for diarrhea. Negative for nausea, vomiting and abdominal pain.  Genitourinary: Negative for dysuria.  Musculoskeletal: Negative for gait problem, neck pain and neck stiffness.  Skin: Negative for rash.  Neurological: Positive for weakness and headaches. Negative for syncope, speech difficulty, light-headedness and numbness.   Psychiatric/Behavioral: Negative for confusion.      Allergies  Amlodipine; Codeine; Demerol; Librium; Lipitor; and Metoprolol  Home Medications   Prior to Admission medications   Medication Sig Start Date End Date Taking? Authorizing Provider  ACCU-CHEK FASTCLIX LANCETS [place]  04/28/14   Historical Provider, MD  acetaminophen (TYLENOL) 500 MG tablet Take 500 mg by mouth every 6 (six) hours as needed (pain).    Historical Provider, MD  amLODipine-valsartan (EXFORGE) 10-320 MG per tablet Take 1 tablet by mouth daily. 03/22/15   Darlin Coco, MD  anastrozole (ARIMIDEX) 1 MG tablet TAKE 1 TABLET BY MOUTH EVERY DAY 02/28/15   Ladell Pier, MD  aspirin EC 81 MG tablet Take 81 mg by mouth daily.    Historical Provider, MD  atenolol (TENORMIN) 50 MG tablet Take 1 tablet (50 mg total) by mouth 2 (two) times daily. 09/07/14   Darlin Coco, MD  Calcium Carbonate-Vitamin D (CALCIUM + D PO) Take 1 tablet by mouth daily after lunch.     Historical Provider, MD  cholecalciferol (VITAMIN D) 1000 UNITS tablet Take 1,000 Units by mouth daily after lunch.     Historical Provider, MD  ezetimibe (ZETIA) 10 MG tablet Take 1 tablet (10 mg total) by mouth daily. Patient taking differently: Take 10 mg by mouth daily at 6 PM.  09/07/14   Darlin Coco, MD  FERROCITE 324 MG TABS TAKE 1 TABLET (106 MG OF IRON TOTAL) BY MOUTH DAILY. Patient taking differently: TAKE 1 TABLET (106 MG OF IRON TOTAL) BY MOUTH DAILY. AFTER LUNCH 02/28/15   Ladell Pier, MD  fluticasone [name] 50 MCG/ACT nasal spray Place 2 sprays into both nostrils daily as needed for allergies or rhinitis.  11/26/11   Historical Provider, MD  furosemide (LASIX) 20 MG tablet Take 20 mg by mouth daily as needed for fluid or edema.  01/12/15   Historical Provider, MD  glimepiride (AMARYL) 4 MG tablet Take 4 mg by mouth daily with lunch.    Historical Provider, MD  Glucose Blood (FREESTYLE LITE TEST VI) 3 (three) times daily as needed (blood  sugar monitoring.).  08/27/13   Historical Provider, MD  levothyroxine (SYNTHROID, LEVOTHROID) 75 MCG tablet Take 75 mcg by mouth daily before breakfast.     Historical Provider, MD  metFORMIN (GLUCOPHAGE) 500 MG tablet Take 500 mg by mouth 2 (two) times daily with a meal.    Historical Provider, MD  OVER THE COUNTER MEDICATION Place 1 drop into both eyes daily as needed (dry eyes).    Historical Provider, MD  pantoprazole (PROTONIX) 40 MG tablet Take 40 mg by mouth daily as needed (acid reflux).     Historical Provider, MD  potassium chloride (KLOR-CON M10) 10 MEQ tablet TAKE 1 TABLET (10 MEQ TOTAL) BY MOUTH DAILY. Patient taking differently: Take 10 mEq by mouth daily before supper.  09/07/14   Darlin Coco, MD  vitamin B-12 (CYANOCOBALAMIN) 1000 MCG tablet Take 1,000 mcg by mouth daily after lunch.    Historical Provider, MD   BP 196/67 mmHg  Pulse 73  Temp(Src) 98.1 F (36.7 C) (Oral)  Resp 18  Ht 5\' 3"  (1.6 m)  Wt 200 lb (90.719 kg)  BMI 35.44 kg/m2  SpO2 99%   Physical Exam  Constitutional: She is oriented to person, place, and time. She appears well-developed and well-nourished.  HENT:  Head: Normocephalic and atraumatic.  Right Ear: Tympanic membrane, external ear and ear canal normal.  Left Ear: Tympanic membrane, external ear and ear canal normal.  Nose: Nose normal.  Mouth/Throat: Uvula is midline, oropharynx is clear and moist and mucous membranes are normal.  No obvious facial droop.   Eyes: Conjunctivae, EOM and lids are normal. Pupils are equal, round, and reactive to light. Right eye exhibits no nystagmus. Left eye exhibits no nystagmus.  Neck: Normal range of motion. Neck supple.  Cardiovascular: Normal rate and regular rhythm.   No murmur heard. Pulmonary/Chest: Effort normal and breath sounds normal. No respiratory distress. She has no wheezes. She has no rales.  Abdominal: Soft. There is no tenderness. There is no rebound and no guarding.  Musculoskeletal:  She exhibits no edema or tenderness.       Cervical back: She exhibits normal range of motion, no tenderness and no bony tenderness.  Neurological: She is alert and oriented to person, place, and time. She has normal strength and normal reflexes. No cranial nerve deficit or sensory deficit. She displays a negative Romberg sign. Coordination normal. GCS eye subscore is 4. GCS verbal subscore is 5. GCS motor subscore is 6.  Skin: Skin is warm and dry.  Psychiatric: She has a normal mood and affect.  Nursing note and vitals reviewed.   ED Course  Procedures (including critical care time) Labs Review Labs Reviewed  CBC - Abnormal; Notable for the following:    WBC 21.0 (*)    All other components within normal limits  DIFFERENTIAL - Abnormal; Notable for the following:    Neutro Abs 12.4 (*)    Lymphs Abs 8.0 (*)    All other components within normal limits  COMPREHENSIVE METABOLIC PANEL - Abnormal; Notable for the following:    Sodium 133 (*)    CO2 20 (*)    Glucose, Bld 254 (*)    GFR calc non Af Amer 58 (*)    All other components within normal limits  URINALYSIS, ROUTINE W REFLEX MICROSCOPIC (NOT AT [name] - Abnormal; Notable for the following:    APPearance CLOUDY (*)    Glucose, UA 250 (*)    Hgb urine dipstick TRACE (*)    Protein, ur 100 (*)    Nitrite POSITIVE (*)    Leukocytes, UA SMALL (*)    All other components within normal limits  URINE MICROSCOPIC-ADD ON - Abnormal; Notable for the following:    Bacteria, UA MANY (*)    Casts HYALINE CASTS (*)    All other components within normal limits  URINE CULTURE  URINE CULTURE  PROTIME-INR  APTT  URINE RAPID DRUG SCREEN, HOSP PERFORMED  I-STAT TROPOININ, ED    Imaging Review Dg Chest 2 View  05/11/2015   CLINICAL DATA:  Weakness. Symptoms for few days. Initial encounter.  EXAM: CHEST  2 VIEW  COMPARISON:  Chest radiograph 07/06/2014.  FINDINGS: Cardiopericardial silhouette is mildly enlarged. Increased LEFT basilar  density is present which appears linear. This could represent atelectasis or pneumonia.  7 mm RIGHT upper lobe faint nodular density is new compared to prior exam from 2015. Given the overlap scapula, this may be artifactual. Attention on follow-up recommended.  IMPRESSION: 1. LEFT basilar opacity, favored to represent atelectasis over airspace disease/pneumonia. 2. Ill-defined  7 mm pulmonary nodule in the RIGHT upper lobe.   Electronically Signed   By: Dereck Ligas M.D.   On: 05/11/2015 11:32   Ct Head Wo Contrast  05/11/2015   CLINICAL DATA:  Fatigue.  Dizziness.  Onset of symptoms last night.  EXAM: CT HEAD WITHOUT CONTRAST  TECHNIQUE: Contiguous axial images were obtained from the base of the skull through the vertex without intravenous contrast.  COMPARISON:  CT head 10/11/2010.  FINDINGS: No mass lesion, mass effect, midline shift, hydrocephalus, hemorrhage. No acute territorial cortical ischemia/infarct. Atrophy and chronic ischemic white matter disease is present.  Mucous retention cyst or polyp in the RIGHT maxillary sinus. Mastoid air cells and middle ears appear clear.  IMPRESSION: Atrophy and chronic ischemic white matter disease without acute intracranial abnormality. Similar appearance of the brain compared to prior exam 10/11/2010.   Electronically Signed   By: Dereck Ligas M.D.   On: 05/11/2015 11:48     EKG Interpretation   Date/Time:  Wednesday May 11 2015 10:24:04 EDT Ventricular Rate:  75 PR Interval:  165 QRS Duration: 147 QT Interval:  463 QTC Calculation: 517 R Axis:   -7 Text Interpretation:  Sinus rhythm Atrial premature complex Right bundle  branch block No significant change since last tracing Confirmed by Ashok Cordia   MD, Lennette Bihari ([id number]) on 05/11/2015 10:42:20 AM       11:09 AM Patient seen and examined. Work-up initiated.    Vital signs reviewed and are as follows: BP 196/67 mmHg  Pulse 73  Temp(Src) 98.1 F (36.7 C) (Oral)  Resp 18  Ht 5\' 3"  (1.6 m)  Wt  200 lb (90.719 kg)  BMI 35.44 kg/m2  SpO2 99%  3:04 PM Work-up has been completed. Patient was seen previously by Dr. Ashok Cordia. Work-up for stroke is negative for acute stroke and exam has remained stable while in the emergency department.  Blood sugar is elevated but no signs of DKA. Normal anion gap.  Patient does have a urinary tract infection, nitrite positive. Culture sent. Rocephin ordered.  Patient admitted to [facility] hospitalist service for treatment of UTI, generalized weakness.     MDM   Final diagnoses:  Acute cystitis without hematuria  Generalized weakness   Admit.    Carlisle Cater, PA-C 05/11/15 [place], [place] 05/11/15 [phone]

## 2015-05-11 NOTE — Progress Notes (Signed)
Pt admitted to [facility] from ED.  Pt AAO X4.  Pt on RA.  Pt has 20G to Rt hand with fluids infusing.  Family to bedside.  Report rcvd from [place], Therapist, sports.  Pt has no questions at the moment.  Will continue to monitor.

## 2015-05-11 NOTE — H&P (Signed)
[facility] History and Physical                                                                                    Sekai Nayak, is a 81 y.o. female  MRN: [id number]   DOB - [birth year]  Admit Date - 05/11/2015  Outpatient Primary MD for the patient is FULP, CAMMIE, MD  Referring MD: Ashok Cordia / ER  With History of -  Past Medical History  Diagnosis Date  . Diabetes mellitus   . Hypertension   . Arthritis   . MVP (mitral valve prolapse)   . Hx of breast cancer     RIGHT BREAST  . Hypothyroidism   . Anemia       Past Surgical History  Procedure Laterality Date  . Hemiarthroplasty hip    . Knee arthroscopy      RIGHT KNEE  . Total abdominal hysterectomy w/ bilateral salpingoophorectomy    . Pelvic and para-aortic lymph node dissection    . Tonsillectomy    . Appendectomy    . Cholecystectomy    . US echocardiography  03/30/2009    EF 55-60%  . Cardiovascular stress test  01/28/2008    EF 74%  . Esophagogastroduodenoscopy N/A 07/08/2014    Procedure: ESOPHAGOGASTRODUODENOSCOPY (EGD);  Surgeon: Winfield Cunas., MD;  Location: [facility];  Service: Endoscopy;  Laterality: N/A;  . Colonoscopy N/A 07/10/2014    Procedure: COLONOSCOPY;  Surgeon: Lear Ng, MD;  Location: [facility];  Service: Endoscopy;  Laterality: N/A;    in for   Chief Complaint  Patient presents with  . Weakness     HPI This is a 81 year old female patient with history of diabetes on oral agents, hypertension, hypothyroidism, recurrent urinary tract infections, breast cancer on Arimidex, as well as mild aortic stenosis. Her family brought her to the hospital because of weakness and mild altered mentation. They were also concerned that the patient may have some right facial drooping. The patient has been progressively more weak and not talking as much for the past 2-3 days she's also been sleeping more than usual. Patient apparently has a history of recurrent UTIs and she has  with her family describes as "a big bottle" of antibiotic (Cipro) prescribed by her primary care physician with instructions to take twice daily for 3 days if she develops urinary tract symptoms. Patient apparently did have dysuria in the early part of June and took Cipro as directed but felt her symptoms were not improving so she just stopped. In addition to the above symptoms patient has also had poor oral intake secondary to poor appetite over the past week.  Upon presentation to the ER patient was afebrile, moderately hypertensive with a blood pressure 196/67, heart rate 73 pulse symmetry was 99% on room air. Because of reports of altered mentation a CT of the head was obtained and was unremarkable and he acute changes. Exam is unremarkable and no noted facial droop by EDP on exam. Sodium was slightly low at 133 renal function was normal, glucose elevated at 254. Patient had leukocytosis with a white count of 21,000 and absolute neutrophil count of 12.4%. Urinalysis  was abnormal with many bacteria, hyaline casts, trace hemoglobin, small leukocytes, positive nitrate, 100 protein and urine specific gravity 1.02, WBC 7-10. 6 revealed left basilar opacity consistent with atelectasis as well as an ill-defined 7 mm pulmonary nodule in the right upper lobe. Review of total body bone scan from March 2016 reveals multiple sites of osseous metastasis.  Review of Systems   In addition to the HPI above,  No Fever-chills, myalgias or other constitutional symptoms No Headache, changes with Vision or hearing, new weakness, tingling, numbness in any extremity, No problems swallowing food or Liquids, indigestion/reflux-poor oral intake as described No Chest pain, Cough or Shortness of Breath, palpitations, orthopnea or DOE No Abdominal pain, N/V; no melena or hematochezia, no dark tarry stools, Bowel movements are regular, No dysuria, hematuria or flank pain since early June No new skin rashes, lesions, masses or  bruises, No new joints pains-aches No recent weight gain or loss No polyuria, polydypsia or polyphagia,  *A full 10 point Review of Systems was done, except as stated above, all other Review of Systems were negative.  Social History History  Substance Use Topics  . Smoking status: Never Smoker   . Smokeless tobacco: Not on file  . Alcohol Use: No    Resides at: Private residence  Lives with: Daughter Jaelynn Pozo  Ambulatory status: Primarily with rolling walker although can walk short distances without walker   Family History Family History  Problem Relation Age of Onset  . Hypertension Mother   . Heart attack Father   . Hypertension Father   . Diabetes Sister   . Hypertension Sister   . Liver cancer Sister   . Heart attack Brother   . Hypertension Brother   . Diabetes Brother   . Diabetes Sister   . Hypertension Sister      Prior to Admission medications   Medication Sig Start Date End Date Taking? Authorizing Provider  ACCU-CHEK FASTCLIX LANCETS [place]  04/28/14  Yes Historical Provider, MD  acetaminophen (TYLENOL) 500 MG tablet Take 500 mg by mouth every 6 (six) hours as needed (pain).   Yes Historical Provider, MD  amLODipine-valsartan (EXFORGE) 10-320 MG per tablet Take 1 tablet by mouth daily. 03/22/15  Yes Darlin Coco, MD  anastrozole (ARIMIDEX) 1 MG tablet TAKE 1 TABLET BY MOUTH EVERY DAY 02/28/15  Yes Ladell Pier, MD  aspirin EC 81 MG tablet Take 81 mg by mouth daily.   Yes Historical Provider, MD  atenolol (TENORMIN) 50 MG tablet Take 1 tablet (50 mg total) by mouth 2 (two) times daily. 09/07/14  Yes Darlin Coco, MD  Calcium Carbonate-Vitamin D (CALCIUM + D PO) Take 1 tablet by mouth daily after lunch.    Yes Historical Provider, MD  cholecalciferol (VITAMIN D) 1000 UNITS tablet Take 1,000 Units by mouth daily after lunch.    Yes Historical Provider, MD  ezetimibe (ZETIA) 10 MG tablet Take 1 tablet (10 mg total) by mouth daily. Patient taking  differently: Take 10 mg by mouth daily at 6 PM.  09/07/14  Yes Darlin Coco, MD  FERROCITE 324 MG TABS TAKE 1 TABLET (106 MG OF IRON TOTAL) BY MOUTH DAILY. Patient taking differently: TAKE 1 TABLET (106 MG OF IRON TOTAL) BY MOUTH DAILY. AFTER LUNCH 02/28/15  Yes Ladell Pier, MD  fluticasone [facility]) 50 MCG/ACT nasal spray Place 2 sprays into both nostrils daily as needed for allergies or rhinitis.  11/26/11  Yes Historical Provider, MD  glimepiride (AMARYL) 4 MG tablet Take 4  mg by mouth daily with lunch.   Yes Historical Provider, MD  Glucose Blood (FREESTYLE LITE TEST VI) 3 (three) times daily as needed (blood sugar monitoring.).  08/27/13  Yes Historical Provider, MD  levothyroxine (SYNTHROID, LEVOTHROID) 75 MCG tablet Take 75 mcg by mouth daily before breakfast.    Yes Historical Provider, MD  metFORMIN (GLUCOPHAGE) 500 MG tablet Take 500 mg by mouth 2 (two) times daily with a meal.   Yes Historical Provider, MD  OVER THE COUNTER MEDICATION Place 1 drop into both eyes daily as needed (dry eyes).   Yes Historical Provider, MD  pantoprazole (PROTONIX) 40 MG tablet Take 40 mg by mouth daily as needed (acid reflux).    Yes Historical Provider, MD  potassium chloride (KLOR-CON M10) 10 MEQ tablet TAKE 1 TABLET (10 MEQ TOTAL) BY MOUTH DAILY. Patient taking differently: Take 10 mEq by mouth daily before supper.  09/07/14  Yes Darlin Coco, MD  vitamin B-12 (CYANOCOBALAMIN) 1000 MCG tablet Take 1,000 mcg by mouth daily after lunch.   Yes Historical Provider, MD    Allergies  Allergen Reactions  . Amlodipine Other (See Comments)    edema  . Codeine Other (See Comments)    Makes her feel "crazy."  . Demerol Other (See Comments)    Makes her feel "crazy."  . Librium Other (See Comments)    Makes her feel "crazy."  . Lipitor [Atorvastatin Calcium] Other (See Comments)    Makes her feel "crazy."  . Metoprolol Other (See Comments)    Pt was told not to take this but was not told why     Physical Exam  Vitals  Blood pressure 186/67, pulse 72, temperature 98 F (36.7 C), temperature source Oral, resp. rate 17, height 5\' 3"  (1.6 m), weight 200 lb (90.719 kg), SpO2 96 %.   General:  In no acute distress, appears healthy and well nourished  Psych:  Normal affect, Denies Suicidal or Homicidal ideations, Awake Alert, Oriented X 3. Speech and thought patterns are clear and appropriate, no apparent short term memory deficits  Neuro:   No focal neurological deficits, CN II through XII intact, Strength 5/5 all 4 extremities, Sensation intact all 4 extremities.  ENT:  Ears and Eyes appear Normal, Conjunctivae clear, PER. Moist oral mucosa without erythema or exudates.  Neck:  Supple, No lymphadenopathy appreciated  Respiratory:  Symmetrical chest wall movement, Good air movement bilaterally, CTAB. Room Air  Cardiac:  RRR, grade 1-8/2 systolic murmur, no LE edema noted, no JVD, No carotid bruits, peripheral pulses palpable at 2+  Abdomen:  Positive bowel sounds, Soft, Non tender, Non distended,  No masses appreciated, no obvious hepatosplenomegaly  Skin:  No Cyanosis, Normal Skin Turgor, No Skin Rash or Bruise.  Extremities: Symmetrical without obvious trauma or injury,  no effusions.  Data Review  CBC  Recent Labs Lab 05/11/15 1048  WBC 21.0*  HGB 13.4  HCT 40.4  PLT 325  MCV 87.1  MCH 28.9  MCHC 33.2  RDW 12.8  LYMPHSABS 8.0*  MONOABS 0.6  EOSABS 0.0  BASOSABS 0.0    Chemistries   Recent Labs Lab 05/11/15 1048  NA 133*  K 3.7  CL 101  CO2 20*  GLUCOSE 254*  BUN 10  CREATININE 0.86  CALCIUM 9.0  AST 26  ALT 22  ALKPHOS 82  BILITOT 0.4    estimated creatinine clearance is 47.4 mL/min (by C-G formula based on Cr of 0.86).  No results for input(s): TSH, T4TOTAL, T3FREE, THYROIDAB  in the last 72 hours.  Invalid input(s): FREET3  Coagulation profile  Recent Labs Lab 05/11/15 1048  INR 1.10    No results for input(s): DDIMER in  the last 72 hours.  Cardiac Enzymes No results for input(s): CKMB, TROPONINI, MYOGLOBIN in the last 168 hours.  Invalid input(s): CK  Invalid input(s): POCBNP  Urinalysis    Component Value Date/Time   COLORURINE YELLOW 05/11/2015 1312   APPEARANCEUR CLOUDY* 05/11/2015 1312   LABSPEC 1.020 05/11/2015 1312   PHURINE 6.0 05/11/2015 1312   GLUCOSEU 250* 05/11/2015 1312   HGBUR TRACE* 05/11/2015 1312   BILIRUBINUR NEGATIVE 05/11/2015 1312   KETONESUR NEGATIVE 05/11/2015 1312   PROTEINUR 100* 05/11/2015 1312   UROBILINOGEN 0.2 05/11/2015 1312   NITRITE POSITIVE* 05/11/2015 1312   LEUKOCYTESUR SMALL* 05/11/2015 1312    Imaging results:   Dg Chest 2 View  05/11/2015   CLINICAL DATA:  Weakness. Symptoms for few days. Initial encounter.  EXAM: CHEST  2 VIEW  COMPARISON:  Chest radiograph 07/06/2014.  FINDINGS: Cardiopericardial silhouette is mildly enlarged. Increased LEFT basilar density is present which appears linear. This could represent atelectasis or pneumonia.  7 mm RIGHT upper lobe faint nodular density is new compared to prior exam from 2015. Given the overlap scapula, this may be artifactual. Attention on follow-up recommended.  IMPRESSION: 1. LEFT basilar opacity, favored to represent atelectasis over airspace disease/pneumonia. 2. Ill-defined 7 mm pulmonary nodule in the RIGHT upper lobe.   Electronically Signed   By: Dereck Ligas M.D.   On: 05/11/2015 11:32   Ct Head Wo Contrast  05/11/2015   CLINICAL DATA:  Fatigue.  Dizziness.  Onset of symptoms last night.  EXAM: CT HEAD WITHOUT CONTRAST  TECHNIQUE: Contiguous axial images were obtained from the base of the skull through the vertex without intravenous contrast.  COMPARISON:  CT head 10/11/2010.  FINDINGS: No mass lesion, mass effect, midline shift, hydrocephalus, hemorrhage. No acute territorial cortical ischemia/infarct. Atrophy and chronic ischemic white matter disease is present.  Mucous retention cyst or polyp in the  RIGHT maxillary sinus. Mastoid air cells and middle ears appear clear.  IMPRESSION: Atrophy and chronic ischemic white matter disease without acute intracranial abnormality. Similar appearance of the brain compared to prior exam 10/11/2010.   Electronically Signed   By: Dereck Ligas M.D.   On: 05/11/2015 11:48     EKG: (Independently reviewed) sinus rhythm with right bundle branch block, no ischemic changes, prolonged QTC of 517 ms   Assessment & Plan  Principal Problem:   UTI (lower urinary tract infection) -Admit to medical floor -Follow up on urine culture -Empiric Rocephin -Given prolonged QTC would at least avoid fluoroquinolones during this admission-may need to repeat EKG prior to discharge to determine if needs to probably discontinue preadmission Cipro -Suspect with recurrent UTI and patient frequent use of Cipro may require a longer course of antibiotic up to 7-10 days to completely eradicate any potential organisms  Active Problems:   Dehydration with hyponatremia -Suspect related to combination poor oral intake as well as likely hyperglycemia at home -IV fluids at 75 mL per hour -Treat hyperglycemia    Benign hypertensive heart disease without heart failure -Blood pressure inadequately controlled initially but most recent reading with BP 160/52. -Continue Norvasc, Tenormin    Type 2 diabetes mellitus with peripheral neuropathy -Hold metformin during acute illness -We'll continue Amaryl for now but monitor patient oral intake -Diabetic diet -Follow CBGs before meals/at bedtime and provide SSI -Hemoglobin A1c was 9.1  in August 2015; repeat this admission    Breast cancer metastasized to bone -Followed as an outpatient by Dr. Benay Spice -Continue Arimidex    Aortic stenosis, mild -Ambulate with assistance while dehydrated    Pure hypercholesterolemia -Continue Zetia    Hypothyroidism -Continue Synthroid -TSH was 1.43 in August 2015; repeat this  admission   DVT Prophylaxis: Lovenox  Family Communication:   Daughter at bedside  Code Status:  Full code  Condition: Stable   Discharge disposition: Anticipate discharge back home with daughter once has returned to baseline mentation and presumed urinary tract infection adequately treated  Time spent in minutes : 60      [place] Gamero L. ANP on 05/11/2015 at 3:09 PM  Between 7am to 7pm - Pager - [phone]  After 7pm go to [email or web address] - password [email or web address]  And look for the night coverage person covering me after hours  [facility]

## 2015-05-12 DIAGNOSIS — E1165 Type 2 diabetes mellitus with hyperglycemia: Secondary | ICD-10-CM

## 2015-05-12 DIAGNOSIS — I10 Essential (primary) hypertension: Secondary | ICD-10-CM

## 2015-05-12 DIAGNOSIS — N390 Urinary tract infection, site not specified: Principal | ICD-10-CM

## 2015-05-12 DIAGNOSIS — E871 Hypo-osmolality and hyponatremia: Secondary | ICD-10-CM

## 2015-05-12 LAB — CBC
HEMATOCRIT: 37.8 % (ref 36.0–46.0)
HEMOGLOBIN: 12.6 g/dL (ref 12.0–15.0)
MCH: 28.9 pg (ref 26.0–34.0)
MCHC: 33.3 g/dL (ref 30.0–36.0)
MCV: 86.7 fL (ref 78.0–100.0)
PLATELETS: 300 10*3/uL (ref 150–400)
RBC: 4.36 MIL/uL (ref 3.87–5.11)
RDW: 12.8 % (ref 11.5–15.5)
WBC: 22.8 10*3/uL — AB (ref 4.0–10.5)

## 2015-05-12 LAB — BASIC METABOLIC PANEL
ANION GAP: 12 (ref 5–15)
BUN: 8 mg/dL (ref 6–20)
CO2: 22 mmol/L (ref 22–32)
CREATININE: 0.73 mg/dL (ref 0.44–1.00)
Calcium: 8.7 mg/dL — ABNORMAL LOW (ref 8.9–10.3)
Chloride: 101 mmol/L (ref 101–111)
Glucose, Bld: 167 mg/dL — ABNORMAL HIGH (ref 65–99)
POTASSIUM: 3.5 mmol/L (ref 3.5–5.1)
Sodium: 135 mmol/L (ref 135–145)

## 2015-05-12 LAB — GLUCOSE, CAPILLARY
GLUCOSE-CAPILLARY: 169 mg/dL — AB (ref 65–99)
Glucose-Capillary: 201 mg/dL — ABNORMAL HIGH (ref 65–99)
Glucose-Capillary: 224 mg/dL — ABNORMAL HIGH (ref 65–99)
Glucose-Capillary: 180 mg/dL — ABNORMAL HIGH (ref 65–99)

## 2015-05-12 LAB — HEMOGLOBIN A1C
Hgb A1c MFr Bld: 9 % — ABNORMAL HIGH (ref 4.8–5.6)
MEAN PLASMA GLUCOSE: 212 mg/dL

## 2015-05-12 LAB — PATHOLOGIST SMEAR REVIEW

## 2015-05-12 MED ORDER — GLUCERNA SHAKE PO LIQD
237.0000 mL | Freq: Two times a day (BID) | ORAL | Status: DC
  Administered 2015-05-12: 237 mL via ORAL

## 2015-05-12 MED ORDER — HYDRALAZINE HCL 20 MG/ML IJ SOLN
10.0000 mg | Freq: Once | INTRAMUSCULAR | Status: AC
Start: 2015-05-12 — End: 2015-05-13
  Administered 2015-05-13: 10 mg via INTRAVENOUS
  Filled 2015-05-12: qty 1

## 2015-05-12 NOTE — Progress Notes (Addendum)
PROGRESS NOTE    Molly [name] WGY:[id number] DOB: [birth year] DOA: 05/11/2015 PCP: Antony Blackbird, MD  HPI/Brief narrative 81 year old female patient with history of DM 2, HTN, hypothyroid, recurrent UTIs, breast cancer, lives at home with daughter, ambulates with the help of a walker, independent of activities of daily living, admitted to [facility] on 05/11/15 with 2 days history of generally not feeling well, weakness, less interactive,? AMS. Denied dysuria, urinary frequency, fever, chills, nausea, vomiting, pain or diarrhea. In the ED, UA suggestive of UTI. Admitted for dehydration and presumed UTI.   Assessment/Plan:   UTI, recurrent - Continue IV Rocephin. Unfortunately it appears as the urine culture was not sent on admission and may not be helpful to send now after being on antibiotics.  Dehydration with hyponatremia - Likely from poor oral intake - Treated with IV fluids and improved. DC IV fluids and encourage oral intake  ? Acute encephalopathy - Secondary to UTI and dehydration - CT head negative for acute findings - Significantly improved per family  Type II DM with peripheral neuropath - Metformin held. Amaryl continued. SSI added. - A1c 9.1 in August 2015. - Uncontrolled in the low 200s. Change diet to diabetic diet.  Breast cancer with bony metastasis it appears - Continue Arimidex  Hypothyroid - TSH 1.43 in August 2015. Continue Synthroid.  Hypercholesterolemia - Continue Zetia.  Essential hypertension - Mildly uncontrolled. Continue Norvasc, ARB and Tenormin. Add when necessary IV hydralazine  Chronic leukocytosis/CLL - Stable  RUL pulmonary nodule - Consider outpatient workup  with CT chest given history of breast cancer.    DVT prophylaxis: Lovenox  Code Status: Full  Family Communication: Discussed with patient's daughter Mrs. Gloria at bedside. Disposition Plan: DC home, possibly in the next 1-2 days.    Consultants:  None    Procedures:  None   Antibiotics:  IV Rocephin 6/29 >  Subjective: Feels better. Denies complaints. As per daughter at bedside, looks much improved, more interactive. No complaints reported.   Objective: Filed Vitals:   05/11/15 2145 05/12/15 0616 05/12/15 1035 05/12/15 1409  BP: 162/66 145/73 180/58 183/62  Pulse: 71 71 68 70  Temp: 98.1 F (36.7 C) 98.2 F (36.8 C)  98.1 F (36.7 C)  TempSrc: Oral Oral  Oral  Resp: 18 18  18   Height:      Weight:      SpO2: 96% 97%  99%    Intake/Output Summary (Last 24 hours) at 05/12/15 1647 Last data filed at 05/12/15 1505  Gross per 24 hour  Intake 2092.5 ml  Output      0 ml  Net 2092.5 ml   Filed Weights   05/11/15 1029  Weight: 90.719 kg (200 lb)     Exam:  General exam: pleasant elderly female sitting up comfortably on chair this morning.  Respiratory system: Clear. No increased work of breathing. Cardiovascular system: S1 & S2 heard, RRR. No JVD, murmurs, gallops, clicks or pedal edema. Telemetry: Sinus rhythm.  Gastrointestinal system: Abdomen is nondistended, soft and nontender. Normal bowel sounds heard. Central nervous system: Alert and oriented. No focal neurological deficits. Extremities: Symmetric 5 x 5 power.   Data Reviewed: Basic Metabolic Panel:  Recent Labs Lab 05/11/15 1048 05/12/15 0318  NA 133* 135  K 3.7 3.5  CL 101 101  CO2 20* 22  GLUCOSE 254* 167*  BUN 10 8  CREATININE 0.86 0.73  CALCIUM 9.0 8.7*   Liver Function Tests:  Recent Labs Lab 05/11/15 1048  AST  26  ALT 22  ALKPHOS 82  BILITOT 0.4  PROT 6.5  ALBUMIN 3.5   No results for input(s): LIPASE, AMYLASE in the last 168 hours. No results for input(s): AMMONIA in the last 168 hours. CBC:  Recent Labs Lab 05/11/15 1048 05/12/15 0318  WBC 21.0* 22.8*  NEUTROABS 12.4*  --   HGB 13.4 12.6  HCT 40.4 37.8  MCV 87.1 86.7  PLT 325 300   Cardiac Enzymes: No results for input(s): CKTOTAL, CKMB, CKMBINDEX, TROPONINI in  the last 168 hours. BNP (last 3 results) No results for input(s): PROBNP in the last 8760 hours. CBG:  Recent Labs Lab 05/11/15 1659 05/11/15 2149 05/12/15 0737 05/12/15 1135  GLUCAP 183* 207* 201* 224*    No results found for this or any previous visit (from the past 240 hour(s)).       Studies: Dg Chest 2 View  05/11/2015   CLINICAL DATA:  Weakness. Symptoms for few days. Initial encounter.  EXAM: CHEST  2 VIEW  COMPARISON:  Chest radiograph 07/06/2014.  FINDINGS: Cardiopericardial silhouette is mildly enlarged. Increased LEFT basilar density is present which appears linear. This could represent atelectasis or pneumonia.  7 mm RIGHT upper lobe faint nodular density is new compared to prior exam from 2015. Given the overlap scapula, this may be artifactual. Attention on follow-up recommended.  IMPRESSION: 1. LEFT basilar opacity, favored to represent atelectasis over airspace disease/pneumonia. 2. Ill-defined 7 mm pulmonary nodule in the RIGHT upper lobe.   Electronically Signed   By: Dereck Ligas M.D.   On: 05/11/2015 11:32   Ct Head Wo Contrast  05/11/2015   CLINICAL DATA:  Fatigue.  Dizziness.  Onset of symptoms last night.  EXAM: CT HEAD WITHOUT CONTRAST  TECHNIQUE: Contiguous axial images were obtained from the base of the skull through the vertex without intravenous contrast.  COMPARISON:  CT head 10/11/2010.  FINDINGS: No mass lesion, mass effect, midline shift, hydrocephalus, hemorrhage. No acute territorial cortical ischemia/infarct. Atrophy and chronic ischemic white matter disease is present.  Mucous retention cyst or polyp in the RIGHT maxillary sinus. Mastoid air cells and middle ears appear clear.  IMPRESSION: Atrophy and chronic ischemic white matter disease without acute intracranial abnormality. Similar appearance of the brain compared to prior exam 10/11/2010.   Electronically Signed   By: Dereck Ligas M.D.   On: 05/11/2015 11:48        Scheduled Meds: .  amLODipine  10 mg Oral Daily  . anastrozole  1 mg Oral Daily  . aspirin EC  81 mg Oral Daily  . atenolol  50 mg Oral BID  . cefTRIAXone (ROCEPHIN)  IV  1 g Intravenous Q24H  . docusate sodium  100 mg Oral BID  . enoxaparin (LOVENOX) injection  40 mg Subcutaneous Q24H  . ezetimibe  10 mg Oral q1800  . feeding supplement (GLUCERNA SHAKE)  237 mL Oral BID BM  . folic acid  1 mg Oral Daily  . glimepiride  4 mg Oral Q lunch  . insulin aspart  0-5 Units Subcutaneous QHS  . insulin aspart  0-9 Units Subcutaneous TID WC  . irbesartan  300 mg Oral Daily  . levothyroxine  75 mcg Oral QAC breakfast  . multivitamin with minerals  1 tablet Oral Daily  . potassium chloride  10 mEq Oral QAC supper  . thiamine  100 mg Oral Daily   Continuous Infusions: . sodium chloride Stopped (05/12/15 1505)    Principal Problem:   UTI (lower urinary  tract infection) Active Problems:   Benign hypertensive heart disease without heart failure   Pure hypercholesterolemia   Breast cancer metastasized to bone   Type 2 diabetes mellitus with peripheral neuropathy   Aortic stenosis, mild   Dehydration with hyponatremia   Hypothyroidism    Time spent: 40 minutes    Ugo Thoma, MD, FACP, FHM. [facility] Pager [phone]  If 7PM-7AM, please contact night-coverage [email or web address] Password [email or web address] 05/12/2015, 4:47 PM    LOS: 1 day

## 2015-05-12 NOTE — Progress Notes (Addendum)
Initial Nutrition Assessment  DOCUMENTATION CODES:  Obesity unspecified  INTERVENTION:  Glucerna Shake po BID, each supplement provides 220 kcal and 10 grams of protein  NUTRITION DIAGNOSIS:  Inadequate oral intake related to  (decreased appetite) as evidenced by meal completion < 50%, per patient/family report.  GOAL:  Patient will meet greater than or equal to 90% of their needs  MONITOR:  PO intake, Supplement acceptance, Diet advancement, Labs, Weight trends, Skin, I & O's  REASON FOR ASSESSMENT:  Malnutrition Screening Tool    ASSESSMENT: Molly [name] is a 81 y.o. female with history of diabetes, hypertension, hypothyroidism, recurrent UTIs, breast cancer presented with lethargy, weakness. Per daughter, patient had been progressively getting more weak and not talking as much and sleeping more. Patient also reported poor oral intake over the past one week. Chest x-ray revealed left basilar opacity consistent with atelectasis and ill-defined 7 mL pulmonary nodule in the right upper lobe.  Pt admitted with UTI.   Hx obtained from pt and daughter at bedside. Both confirm poor appetite over the last 1-2 weeks. Pt daughter reports that pt will report she is hungry, and then only eat a few bites of food off her plate. Pt reports she "ate some" of her breakfast this morning; noted 50% meal completion. Typically, pt has a very hearty appetite. She does not consume supplements at home; pt daughter is concerned about Ensure order, due to pt's uncontrolled blood sugar. RD will modify order to Glucerna.   Pt denies any weight loss. Noted UBW around 200#.   Pt daughter reports that pt's glucose level are poorly controlled at baseline ("even with the medicine"). Pt was on oral agents (Metformin and Amaryl) PTA. Hgb A1c: 9.0. They decline any further diet education needs at this time.   Nutrition-Focused physical exam completed. Findings are no fat depletion, no muscle depletion, and  no edema.   Discussed importance of good PO intake to support healing process.    Height:  Ht Readings from Last 1 Encounters:  05/11/15 5\' 3"  (1.6 m)    Weight:  Wt Readings from Last 1 Encounters:  05/11/15 200 lb (90.719 kg)    Ideal Body Weight:  52 kg  Wt Readings from Last 10 Encounters:  05/11/15 200 lb (90.719 kg)  03/19/15 201 lb (91.173 kg)  02/18/15 203 lb 1.9 oz (92.135 kg)  02/07/15 201 lb 12.8 oz (91.536 kg)  09/22/14 198 lb 9.6 oz (90.084 kg)  09/07/14 198 lb (89.812 kg)  08/02/14 198 lb 4.8 oz (89.948 kg)  07/06/14 200 lb 1.6 oz (90.765 kg)  07/06/14 200 lb 1.6 oz (90.765 kg)  04/27/14 206 lb (93.441 kg)    BMI:  Body mass index is 35.44 kg/(m^2).  Estimated Nutritional Needs:  Kcal:  1600-1800  Protein:  70-80 grams  Fluid:  1.6-1.8 L  Skin:  Reviewed, no issues  Diet Order:  Heart Healthy  EDUCATION NEEDS:  No education needs identified at this time   Intake/Output Summary (Last 24 hours) at 05/12/15 1044 Last data filed at 05/12/15 0900  Gross per 24 hour  Intake 1361.25 ml  Output      0 ml  Net 1361.25 ml    Last BM:  05/12/15  Boni Maclellan A. Jimmye Norman, RD, LDN, CDE Pager: [phone] After hours Pager: [phone]

## 2015-05-12 NOTE — Progress Notes (Signed)
PT Cancellation Note  Patient Details Name: Molly [name] MRN: [id number] DOB: November 27, 1922   Cancelled Treatment:    Reason Eval/Treat Not Completed: Patient declined, no reason specified Pt just got back into bed after sitting in chair most of day and declined participating in PT. Will follow up next available time as appropriate.   [name] 05/12/2015, 2:11 PM  [name], [name], DPT [phone]

## 2015-05-12 NOTE — Care Management Note (Signed)
Case Management Note  Patient Details  Name: FREJA FARO MRN: [id number] Date of Birth: November 19, 1922  Subjective/Objective:                  UR completed . Will await PT/OT evals   Action/Plan:   Expected Discharge Date:        05-15-15          Expected Discharge Plan:     In-House Referral:     Discharge planning Services     Post Acute Care Choice:    Choice offered to:     DME Arranged:    DME Agency:     HH Arranged:    HH Agency:     Status of Service:  In process, will continue to follow  Medicare Important Message Given:    Date Medicare IM Given:    Medicare IM give by:    Date Additional Medicare IM Given:    Additional Medicare Important Message give by:     If discussed at [place] of Stay Meetings, dates discussed:    Additional Comments:  Marilu Favre, RN 05/12/2015, 10:34 AM

## 2015-05-13 DIAGNOSIS — R55 Syncope and collapse: Secondary | ICD-10-CM

## 2015-05-13 LAB — BASIC METABOLIC PANEL
Anion gap: 11 (ref 5–15)
BUN: 9 mg/dL (ref 6–20)
CALCIUM: 8.6 mg/dL — AB (ref 8.9–10.3)
CO2: 23 mmol/L (ref 22–32)
CREATININE: 0.77 mg/dL (ref 0.44–1.00)
Chloride: 102 mmol/L (ref 101–111)
GLUCOSE: 250 mg/dL — AB (ref 65–99)
Potassium: 3.3 mmol/L — ABNORMAL LOW (ref 3.5–5.1)
SODIUM: 136 mmol/L (ref 135–145)

## 2015-05-13 LAB — GLUCOSE, CAPILLARY
GLUCOSE-CAPILLARY: 171 mg/dL — AB (ref 65–99)
GLUCOSE-CAPILLARY: 188 mg/dL — AB (ref 65–99)
Glucose-Capillary: 164 mg/dL — ABNORMAL HIGH (ref 65–99)
Glucose-Capillary: 223 mg/dL — ABNORMAL HIGH (ref 65–99)
Glucose-Capillary: 204 mg/dL — ABNORMAL HIGH (ref 65–99)

## 2015-05-13 MED ORDER — POTASSIUM CHLORIDE CRYS ER 20 MEQ PO TBCR
20.0000 meq | EXTENDED_RELEASE_TABLET | Freq: Once | ORAL | Status: AC
  Administered 2015-05-13: 20 meq via ORAL
  Filled 2015-05-13: qty 1

## 2015-05-13 MED ORDER — SODIUM CHLORIDE 0.9 % IV SOLN
INTRAVENOUS | Status: DC
  Administered 2015-05-13 – 2015-05-14 (×2): via INTRAVENOUS

## 2015-05-13 NOTE — Progress Notes (Signed)
Writer called to see pt after an incident. Per RN, pt wanted to go to bathroom. She was assisted to the bathroom by the nurse tech. While going to the bathroom, pt stated she felt like she was going to pass out. Pt assisted to chair. Pt lost consciousness for about one minute. Never lost pulse or respirations. Upon awakening, she was assisted back to bed and quickly returned to normal baseline mental status. CBG OK. BP prior to pt getting up was 213/66 at 0045 hrs. RN gave prn Hydralazine at 0048. Pt got up at 0130.  S: Per pt, she had been feeling hot earlier when BP was high. When she got up, she states she got hot again and felt dizzy. She says she "knew" she was going to pass out at that point. No pre event CP or SOB, HA, or weakness. After event, she feels fine and denies any dizziness, CP, SOB, HA, joint pain or muscle soreness. Just wants to go back to sleep.  O: Chronically ill appearing WF in NAD. She is alert. Answers questions appropriately. VS-afebrile. HR 78, RR 18, BP 194/54. Slightly pale. Skin is warm and dry. Card: RRR, +murmur. Lungs-CTA. MOE x 4. Follows commands. Tongue is midline. Face is symmetrical. Strength 5/5. No pronator drift noted. No noted skin/joint injuries.  A/P: 1. Syncope with prodromal symptoms-suspect vaso vagal reaction or orthostasis after receiving hydralazine prn or due to dehydration. She is back to baseline quickly and daughter agrees. Tele for 24 hrs or longer if needed. Writer doesn't feel this requires any urgent workup tonight. She had a CT head on admission. Will defer to attending further workup as necessary. Bedrest for tonight. Push fluids po. BMP stat-may need more IVFs.  Clance Boll, NP [facility]

## 2015-05-13 NOTE — Evaluation (Signed)
Physical Therapy Evaluation Patient Details Name: Molly [name] MRN: [id number] DOB: 06-22-1923 Today's Date: 05/13/2015   History of Present Illness  81 year old female patient with history of DM 2, HTN, hypothyroid, recurrent UTIs, breast cancer, lives at home with daughter, ambulates with the help of a walker, independent of activities of daily living, admitted to [facility] on 05/11/15 with 2 days history of generally not feeling well, weakness, less interactive,? AMS. Denied dysuria, urinary frequency, fever, chills, nausea, vomiting, pain or diarrhea. In the ED, UA suggestive of UTI. Admitted for dehydration and presumed UTI  Clinical Impression  Pt admitted with above diagnosis. Pt currently with functional limitations due to the deficits listed below (see PT Problem List). Orthostatics taken (see below). No complaints of dizziness during therapy session. Transfers at a supervision level today. Incontinent (urine) upon standing. Declined to ambulate further than to chair due to lunch arriving. Daughter present and states patient is "80% back to baseline" functionally. Will follow and progress while admitted however, feel she continue to improve her function as she progresses medically.      Follow Up Recommendations No PT follow up;Supervision/Assistance - 24 hour (24 hour supervision initially)    Equipment Recommendations  None recommended by PT    Recommendations for Other Services       Precautions / Restrictions Precautions Precautions: Fall Precaution Comments: episode of syncope Restrictions Weight Bearing Restrictions: No      Mobility  Bed Mobility Overal bed mobility: Modified Independent             General bed mobility comments: extra time, use of rail.  Transfers Overall transfer level: Needs assistance Equipment used: Rolling walker (2 wheeled) Transfers: Sit to/from Stand Sit to Stand: Supervision         General transfer comment: supervision for  safety. VC for hand placement. No loss of balance. Episode of incontinence upon standing. Pivot to [facility] with supervision.  Ambulation/Gait Ambulation/Gait assistance: Supervision Ambulation Distance (Feet): 5 Feet Assistive device: Rolling walker (2 wheeled) Gait Pattern/deviations: Step-through pattern;Decreased stride length;Trunk flexed Gait velocity: slow   General Gait Details: VC for upright posture and walker placement for proximity. Declines ambulating further due to lunch arriving. No over loss of balance noted with this short distance covered. States she feels near baseline although may be moving slightly slower per her report. Daughter present and states pt near baseline also.  Stairs            Wheelchair Mobility    Modified Rankin (Stroke Patients Only)       Balance Overall balance assessment: Needs assistance Sitting-balance support: No upper extremity supported;Feet supported Sitting balance-Leahy Scale: Good     Standing balance support: No upper extremity supported Standing balance-Leahy Scale: Fair                               Pertinent Vitals/Pain Pain Assessment: No/denies pain   Supine: BP 181/49 HR 64 Sitting: BP 172/61 HR 67 Standing: BP 143/55 HR 74    Home Living Family/patient expects to be discharged to:: Private residence Living Arrangements: Children ("Daughter is in and out") Available Help at Discharge: Family;Available PRN/intermittently Type of Home: House Home Access: Stairs to enter Entrance Stairs-Rails: Psychiatric nurse of Steps: 1 Home Layout: One level Home Equipment: Walker - 2 wheels;Bedside commode;Walker - 4 wheels      Prior Function Level of Independence: Independent with assistive device(s)  Comments: walker for ambulation     Hand Dominance   Dominant Hand: Right    Extremity/Trunk Assessment   Upper Extremity Assessment: Defer to OT evaluation            Lower Extremity Assessment: Generalized weakness         Communication   Communication: No difficulties  Cognition Arousal/Alertness: Awake/alert Behavior During Therapy: WFL for tasks assessed/performed Overall Cognitive Status: Within Functional Limits for tasks assessed                      General Comments General comments (skin integrity, edema, etc.): orthostatics taken - see vitals section. She denied any symptoms of dizziness or lightheadedness.    Exercises        Assessment/Plan    PT Assessment Patient needs continued PT services  PT Diagnosis Abnormality of gait;Difficulty walking;Generalized weakness   PT Problem List Decreased strength;Decreased activity tolerance;Decreased balance;Decreased mobility;Decreased knowledge of use of DME;Obesity  PT Treatment Interventions DME instruction;Gait training;Stair training;Functional mobility training;Therapeutic activities;Therapeutic exercise;Balance training;Neuromuscular re-education;Patient/family education   PT Goals (Current goals can be found in the Care Plan section) Acute Rehab PT Goals Patient Stated Goal: none stated PT Goal Formulation: With patient Time For Goal Achievement: 05/27/15 Potential to Achieve Goals: Good    Frequency Min 3X/week   Barriers to discharge   Daughter states she can provide 24/7 care initially as needed.    Co-evaluation               End of Session Equipment Utilized During Treatment: Gait belt;Oxygen Activity Tolerance: Patient tolerated treatment well Patient left: in chair;with call bell/phone within reach;with family/visitor present Nurse Communication: Mobility status         Time: 1249-1316 PT Time Calculation (min) (ACUTE ONLY): 27 min   Charges:   PT Evaluation $Initial PT Evaluation Tier I: 1 Procedure PT Treatments $Therapeutic Activity: 8-22 mins   PT G CodesEllouise [name] 05/13/2015, 1:37 PM Molly [name] Molly [name],  [place]

## 2015-05-13 NOTE — Progress Notes (Signed)
PROGRESS NOTE    ALZORA HA EXB:[id number] DOB: Mar 31, 1923 DOA: 05/11/2015 PCP: Antony Blackbird, MD  HPI/Brief narrative 81 year old female patient with history of DM 2, HTN, hypothyroid, recurrent UTIs, breast cancer, lives at home with daughter, ambulates with the help of a walker, independent of activities of daily living, admitted to [facility] on 05/11/15 with 2 days history of generally not feeling well, weakness, less interactive,? AMS. Denied dysuria, urinary frequency, fever, chills, nausea, vomiting, pain or diarrhea. In the ED, UA suggestive of UTI. Admitted for dehydration and presumed UTI.   Assessment/Plan:   UTI, recurrent - Continue IV Rocephin. Unfortunately it appears as the urine culture was not sent on admission and may not be helpful to send now after being on antibiotics. - will consider transitioning to oral Ceftin at discharge-previous urine cultures were sensitive to Rocephin  Syncope - Overnight 6/30, patient had a brief syncopal episode. Etiology: DD-vasovagal versus orthostatic - Orthostatic blood pressure checks negative. - Discussed at length with patient's regarding gradual movements to prevent symptoms related to orthostatic changes and she verbalized understanding - Brief IV fluids.  Dehydration with hyponatremia - Likely from poor oral intake - Treated with IV fluids and improved.  - Continue brief IV fluids for additional 24 hours.  ? Acute encephalopathy - Secondary to UTI and dehydration - CT head negative for acute findings - Significantly improved per family  Type II DM with peripheral neuropath - Metformin held. Amaryl continued. SSI added. - A1c 9.0 - Fluctuating and mildly uncontrolled.  Breast cancer with bony metastasis it appears - Continue Arimidex  Hypothyroid - TSH 1.43 in August 2015. Continue Synthroid.  Hypercholesterolemia - Continue Zetia.  Essential hypertension - Mildly uncontrolled. Continue Norvasc, ARB and  Tenormin. Add when necessary IV hydralazine  Chronic leukocytosis/CLL - Stable  RUL pulmonary nodule - Consider outpatient workup  with CT chest given history of breast cancer.  Hypokalemia - Replace and follow    DVT prophylaxis: Lovenox  Code Status: Full  Family Communication: Discussed with patient's daughter Mrs. Gloria at bedside on 6/30. None at bedside today. Disposition Plan: DC home, possibly in the next 1-2 days.    Consultants:  None   Procedures:  None   Antibiotics:  IV Rocephin 6/29 >  Subjective: Overnight events noted. While ambulating to bathroom with assistance, she complained of feeling hot, dizzy, presyncopal and then passed out for approximately a minute. Denied chest pain, palpitations or dyspnea. This morning states that she feels much better and denies complaints.  Objective: Filed Vitals:   05/13/15 1300 05/13/15 1417 05/13/15 1530 05/13/15 1611  BP:  170/64    Pulse:  60 62 61  Temp:  97.8 F (36.6 C)    TempSrc:  Oral    Resp:  16    Height:      Weight:      SpO2: 99%  94% 96%    Intake/Output Summary (Last 24 hours) at 05/13/15 1730 Last data filed at 05/13/15 1536  Gross per 24 hour  Intake 989.17 ml  Output    800 ml  Net 189.17 ml   Filed Weights   05/11/15 1029  Weight: 90.719 kg (200 lb)     Exam:  General exam: pleasant elderly female lying comfortably in bed this morning.  Respiratory system: Clear. No increased work of breathing. Cardiovascular system: S1 & S2 heard, RRR. No JVD, murmurs, gallops, clicks or pedal edema. Telemetry: Sinus rhythm.  Gastrointestinal system: Abdomen is nondistended, soft and nontender.  Normal bowel sounds heard. Central nervous system: Alert and oriented. No focal neurological deficits. Extremities: Symmetric 5 x 5 power.   Data Reviewed: Basic Metabolic Panel:  Recent Labs Lab 05/11/15 1048 05/12/15 0318 05/13/15 0311  NA 133* 135 136  K 3.7 3.5 3.3*  CL 101 101 102    CO2 20* 22 23  GLUCOSE 254* 167* 250*  BUN 10 8 9   CREATININE 0.86 0.73 0.77  CALCIUM 9.0 8.7* 8.6*   Liver Function Tests:  Recent Labs Lab 05/11/15 1048  AST 26  ALT 22  ALKPHOS 82  BILITOT 0.4  PROT 6.5  ALBUMIN 3.5   No results for input(s): LIPASE, AMYLASE in the last 168 hours. No results for input(s): AMMONIA in the last 168 hours. CBC:  Recent Labs Lab 05/11/15 1048 05/12/15 0318  WBC 21.0* 22.8*  NEUTROABS 12.4*  --   HGB 13.4 12.6  HCT 40.4 37.8  MCV 87.1 86.7  PLT 325 300   Cardiac Enzymes: No results for input(s): CKTOTAL, CKMB, CKMBINDEX, TROPONINI in the last 168 hours. BNP (last 3 results) No results for input(s): PROBNP in the last 8760 hours. CBG:  Recent Labs Lab 05/12/15 2151 05/13/15 0127 05/13/15 0737 05/13/15 1148 05/13/15 1709  GLUCAP 180* 164* 223* 171* 204*    No results found for this or any previous visit (from the past 240 hour(s)).       Studies: No results found.      Scheduled Meds: . amLODipine  10 mg Oral Daily  . anastrozole  1 mg Oral Daily  . aspirin EC  81 mg Oral Daily  . atenolol  50 mg Oral BID  . cefTRIAXone (ROCEPHIN)  IV  1 g Intravenous Q24H  . docusate sodium  100 mg Oral BID  . enoxaparin (LOVENOX) injection  40 mg Subcutaneous Q24H  . ezetimibe  10 mg Oral q1800  . feeding supplement (GLUCERNA SHAKE)  237 mL Oral BID BM  . folic acid  1 mg Oral Daily  . glimepiride  4 mg Oral Q lunch  . insulin aspart  0-5 Units Subcutaneous QHS  . insulin aspart  0-9 Units Subcutaneous TID WC  . irbesartan  300 mg Oral Daily  . levothyroxine  75 mcg Oral QAC breakfast  . multivitamin with minerals  1 tablet Oral Daily  . potassium chloride  10 mEq Oral QAC supper  . thiamine  100 mg Oral Daily   Continuous Infusions: . sodium chloride Stopped (05/13/15 1536)    Principal Problem:   UTI (lower urinary tract infection) Active Problems:   Benign hypertensive heart disease without heart failure    Pure hypercholesterolemia   Breast cancer metastasized to bone   Type 2 diabetes mellitus with peripheral neuropathy   Aortic stenosis, mild   Dehydration with hyponatremia   Hypothyroidism    Time spent: 40 minutes    HONGALGI,ANAND, MD, FACP, FHM. [facility] Pager [phone]  If 7PM-7AM, please contact night-coverage [email or web address] Password [email or web address] 05/13/2015, 5:30 PM    LOS: 2 days

## 2015-05-13 NOTE — Significant Event (Signed)
Rapid Response Event Note Call received per floor RN regarding Pt unresponsive with a pulse and shallow breathing while in bathroom. Instructed RN to place Pt back to bed and obtain VS. Upon my arrival Pt awake and VS obtained.   Overview: Time Called: 0123 Arrival Time: 0132 Event Type: Respiratory, Cardiac  Initial Focused Assessment: Pt found resting in bed. Lethargic initally but within 10 minutes of my arrival Pt at baseline responsive, alert and follows commands. Denies pain or SOB. Lungs clear, po2 95-100% on RA. ABD distended and soft. Pt complains of nausea, denies ABD pain. BP elevated.   Interventions: Zofran 4mg  IVP given for nausea. Lamar Blinks NP [facility] on call paged and updated per floor RN regarding Pt status, Tylene Fantasia NP In House NP at bedside to assess Pt, up dated on current VS and syncopal episode. Labs and Telemetry monitoring ordered. RN tomonitor closely and update Provider with labs and condition changes.   Event Summary: Name of Physician Notified: Lamar Blinks NP [facility] call---K.Kiby NP [facility] In house at [place]    at          [place], [place]

## 2015-05-13 NOTE — Progress Notes (Signed)
Inpatient Diabetes Program Recommendations  AACE/ADA: New Consensus Statement on Inpatient Glycemic Control (2013)  Target Ranges:  Prepandial:   less than 140 mg/dL      Peak postprandial:   less than 180 mg/dL (1-2 hours)      Critically ill patients:  140 - 180 mg/dL   Review of Glycemic Control: Results for MARYKAY, MCCLEOD (MRN [id number]) as of 05/13/2015 14:24  Ref. Range 05/12/2015 17:12 05/12/2015 21:51 05/13/2015 01:27 05/13/2015 07:37 05/13/2015 11:48  Glucose-Capillary Latest Ref Range: 65-99 mg/dL 169 (H) 180 (H) 164 (H) 223 (H) 171 (H)     CBG's slightly higher than goal.  May consider adding Levemir 8 units daily while patient is in the hospital.  Thanks, Adah Perl, RN, BC-ADM Inpatient Diabetes Coordinator Pager [phone] (8a-5p)

## 2015-05-13 NOTE — Progress Notes (Signed)
2314 Patient B/P 202/78 after atenolol given. Chaney Malling NP notified. 2346 Patient's B/P 210/90 attempted to give hydralazine IV but IV infilitrated and unable to restart. IV therapy notified. 5498 B/P 194/54 Hydralazine 10mg  given IV. 0120 Patient got to bathroom assisted by nursing tech. Patient was standing and told tech she was going to past out. Nurse Tech assisted patient to sit on the commode. Patient became unresponsive. Nurse enter room and patient was unresponsive. Patient was lifted to recliner and lifted into bed, patient still unresponsive.0130 Rapid Response nurse notified and B/P 202/70. Patient now responding states I do not feel good. Rapid Response Nurse up to assess patient.0134 Patient c/o nausea zofran 4mg  IV given  0149 B/P 194/54 patient more responsive oriented x4. Patient stated she got dizzy and then past out. 0209 Patient B/P195/54 patient resting. 0344 B/P 192/53 Forrest Moron notified of elevated blood pressure and BMET results was in the computer. Orders received for KCL 67meq to be given at 0800. 0547 B/P 196/86 P80 R16 O2 sat 98 on 2l/m nasal cannula. 2641 Forrest Moron NP notified of elevated B/P. Patient resting. Will continue to monitor.

## 2015-05-13 NOTE — Evaluation (Addendum)
Occupational Therapy Evaluation Patient Details Name: Molly [name] MRN: [id number] DOB: [birth year] Today's Date: 05/13/2015    History of Present Illness 81 year old female patient with history of DM 2, HTN, hypothyroid, recurrent UTIs, breast cancer, lives at home with daughter, ambulates with the help of a walker, independent of activities of daily living, admitted to [facility] on 05/11/15 with 2 days history of generally not feeling well, weakness, less interactive,? AMS. Denied dysuria, urinary frequency, fever, chills, nausea, vomiting, pain or diarrhea. In the ED, UA suggestive of UTI. Admitted for dehydration and presumed UTI   Clinical Impression   Pt currently presents at a min guard assist level for functional transfers and selfcare tasks.  Feel she will benefit from acute care OT to reach modified independent level for discharge home with her daughter.  Do not anticipate any post acute OT needs at this time.  Pt may need shower tub bench for safety at home will discuss next visit.    Follow Up Recommendations  Supervision/Assistance - 24 hour    Equipment Recommendations  Tub/shower bench       Precautions / Restrictions Precautions Precautions: Fall Precaution Comments: episode of syncope Restrictions Weight Bearing Restrictions: Yes      Mobility Bed Mobility Overal bed mobility: Modified Independent             General bed mobility comments: extra time, use of rail.  Transfers Overall transfer level: Needs assistance Equipment used: Rolling walker (2 wheeled) Transfers: Stand Pivot Transfers Sit to Stand: Min guard Stand pivot transfers: Min guard            Balance     Sitting balance-Leahy Scale: Good       Standing balance-Leahy Scale: Fair                              ADL Overall ADL's : Needs assistance/impaired Eating/Feeding: Independent   Grooming: Wash/dry hands;Brushing hair;Oral care   Upper Body Bathing:  Supervision/ safety;Sitting   Lower Body Bathing: Min guard;Sit to/from stand   Upper Body Dressing : Supervision/safety;Sitting   Lower Body Dressing: Min guard;Sit to/from stand   Toilet Transfer: Min guard;Comfort height toilet;RW   Toileting- Water quality scientist and Hygiene: Min guard;Sit to/from stand       Functional mobility during ADLs: Min guard;Rolling walker General ADL Comments: Pt performs functional mobility with min guard assist using RW.  She needed min instructional cueing for hand placement with sit to stand and stand to sit transitions.       Vision Vision Assessment?: No apparent visual deficits   Perception Perception Perception Tested?: No   Praxis Praxis Praxis tested?: Within functional limits    Pertinent Vitals/Pain Pain Assessment: No/denies pain     Hand Dominance Right   Extremity/Trunk Assessment Upper Extremity Assessment Upper Extremity Assessment: Overall WFL for tasks assessed   Lower Extremity Assessment Lower Extremity Assessment: Defer to PT evaluation   Cervical / Trunk Assessment Cervical / Trunk Assessment: Kyphotic   Communication Communication Communication: No difficulties   Cognition Arousal/Alertness: Awake/alert Behavior During Therapy: WFL for tasks assessed/performed Overall Cognitive Status: Within Functional Limits for tasks assessed                                Home Living Family/patient expects to be discharged to:: Private residence Living Arrangements: Children ("Daughter is in and out") Available Help at  Discharge: Family;Available PRN/intermittently Type of Home: House Home Access: Stairs to enter [employer] of Steps: 1 Entrance Stairs-Rails: Right;Left Home Layout: One level     Bathroom Shower/Tub: Tub/shower unit;Curtain Shower/tub characteristics: Architectural technologist: Standard Bathroom Accessibility: Yes   Home Equipment: Environmental consultant - 2 wheels;Bedside  commode;Walker - 4 wheels          Prior Functioning/Environment Level of Independence: Independent with assistive device(s)        Comments: walker for ambulation    OT Diagnosis: Generalized weakness   OT Problem List: Decreased strength;Impaired balance (sitting and/or standing);Decreased knowledge of use of DME or AE   OT Treatment/Interventions: Self-care/ADL training;Patient/family education;Balance training;Therapeutic activities;DME and/or AE instruction    OT Goals(Current goals can be found in the care plan section) Acute Rehab OT Goals Patient Stated Goal: Pt did not state this session but is eager to go back home as soon as possible. OT Goal Formulation: With patient Time For Goal Achievement: 05/20/15 Potential to Achieve Goals: Good ADL Goals Pt Will Perform Grooming: with modified independence;standing Pt Will Perform Lower Body Bathing: with modified independence;sit to/from stand Pt Will Perform Lower Body Dressing: with modified independence;sit to/from stand Pt Will Transfer to Toilet: with modified independence;bedside commode;ambulating Pt Will Perform Tub/Shower Transfer: ambulating;Tub transfer;with supervision;rolling walker;tub bench  OT Frequency: Min 2X/week              End of Session Equipment Utilized During Treatment: Rolling walker Nurse Communication: Mobility status  Activity Tolerance: Patient tolerated treatment well Patient left: in bed;with call bell/phone within reach;with family/visitor present;with bed alarm set   Time: 0233-4356 OT Time Calculation (min): 33 min Charges:  OT General Charges $OT Visit: 1 Procedure OT Evaluation $Initial OT Evaluation Tier I: 1 Procedure OT Treatments $Self Care/Home Management : 23-37 mins  Andrae Claunch OTR/L 05/13/2015, 4:41 PM

## 2015-05-14 LAB — GLUCOSE, CAPILLARY
Glucose-Capillary: 189 mg/dL — ABNORMAL HIGH (ref 65–99)
Glucose-Capillary: 197 mg/dL — ABNORMAL HIGH (ref 65–99)

## 2015-05-14 MED ORDER — CEFUROXIME AXETIL 250 MG PO TABS: 250.0000 mg | ORAL_TABLET | Freq: Two times a day (BID) | ORAL | Status: DC

## 2015-05-14 MED ORDER — HYDRALAZINE HCL 20 MG/ML IJ SOLN
2.0000 mg | Freq: Once | INTRAMUSCULAR | Status: AC
  Administered 2015-05-14: 2 mg via INTRAVENOUS
  Filled 2015-05-14: qty 1

## 2015-05-14 NOTE — Discharge Instructions (Signed)

## 2015-05-14 NOTE — Progress Notes (Signed)
Discharge instructions gone over with patient and daughter. Home medications gone over. Prescription given. Follow up appointments to be made. Diet, activity, and reasons to call the doctor gone over.  Information regarding signs and symptoms of urinary tract infection gone over, completing oral antibiotics , and drinking plenty of water were discussed. Patient verbalized understanding of information.

## 2015-05-14 NOTE — Discharge Summary (Signed)
Physician Discharge Summary  Molly [name] XTK:[id number] DOB: 01-28-1923 DOA: 05/11/2015  PCP: Antony Blackbird, MD  Admit date: 05/11/2015 Discharge date: 05/14/2015  Time spent: Less than 30 minutes  Recommendations for Outpatient Follow-up:  1. Dr. Antony Blackbird, PCP in 1 week with repeat labs (CBC & BMP). 2. Dr. Darlin Coco, Cardiology in 2 weeks. 3. Dr. Ladell Pier, Oncology: Family advised to call next week regarding RUL pulmonary nodules seen on chest x-ray during hospitalization to see if it needs any further workup.  Discharge Diagnoses:  Principal Problem:   UTI (lower urinary tract infection) Active Problems:   Benign hypertensive heart disease without heart failure   Pure hypercholesterolemia   Breast cancer metastasized to bone   Type 2 diabetes mellitus with peripheral neuropathy   Aortic stenosis, mild   Dehydration with hyponatremia   Hypothyroidism   Discharge Condition: Improved & Stable  Diet recommendation: Heart healthy and diabetic diet.  Filed Weights   05/11/15 1029  Weight: 90.719 kg (200 lb)    History of present illness:  81 year old female patient with history of DM 2, HTN, hypothyroid, recurrent UTIs, breast cancer, lives at home with daughter, ambulates with the help of a walker, independent of activities of daily living, admitted to [facility] on 05/11/15 with 2 days history of generally not feeling well, weakness, less interactive,? AMS. Denied dysuria, urinary frequency, fever, chills, nausea, vomiting, pain or diarrhea. In the ED, UA suggestive of UTI. Admitted for dehydration and presumed UTI.  Hospital Course:   UTI, recurrent - Treated empirically with IV Rocephin. Unfortunately it appears as the urine culture was not sent on admission and may not be helpful to send now after being on antibiotics. - Completed 3 days of IV antibiotics - will transition to oral Ceftin and complete total 7 days treatment.  Syncope - Overnight 6/30, patient  had a brief syncopal episode. Etiology: DD-vasovagal versus orthostatic - Orthostatic blood pressure checks negative. - Discussed at length with patient and daughter regarding gradual movements to prevent symptoms related to orthostatic changes and she verbalized understanding - Telemetry without arrhythmia alarms or bradycardia.  Dehydration with hyponatremia - Likely from poor oral intake - Resolved after IV fluids  ? Acute encephalopathy - Secondary to UTI and dehydration - CT head negative for acute findings - Resolved.  Type II DM with peripheral neuropath - Metformin held. Amaryl continued. SSI added in hospital. - A1c 9.0 - Fluctuating and mildly uncontrolled. - Continue oral hypoglycemics at discharge and follow-up outpatient with PCP regarding further management.  Breast cancer with bony metastasis it appears - Continue Arimidex - Discussed with patient's daughter [name] and advised her to follow-up with Dr. Benay Spice regarding need for further workup of RUL nodule seen on chest x-ray. She verbalized understanding.  Hypothyroid - TSH 1.43 in August 2015. Continue Synthroid.  Hypercholesterolemia - Continue Zetia.  Essential hypertension - Uncontrolled & fluctuating. Patient states that even at home many times her systolic blood pressures are in the 190s. - Currently may be exacerbated by stress related to hospitalization, lack of adequate sleep at night, patient worried that her neighbor recently died and her daughter/caregiver recently hospitalized. - Continue prior home medications i.e. Exforge and atenolol at discharge. - Close outpatient follow-up with PCP or cardiologist regarding further management.  Chronic leukocytosis/CLL - Stable. Outpatient follow-up with oncology  7 mm RUL pulmonary nodule - Consider outpatient workup with CT chest given history of breast cancer. - Outpatient follow-up with oncology as indicated above -  daughter Molly [name] states that  this is old.  Hypokalemia - Replace and follow - Received additional dose of potassium on 7/1.    Consultants:  None  Procedures:  None   Discharge Exam:  Complaints: "I want to go home". States that she feels better. Denies any complaints. No dizziness, lightheadedness, weakness, chest pain or palpitations. As per daughter, patient is back to her baseline.  Filed Vitals:   05/13/15 2113 05/14/15 0145 05/14/15 0530 05/14/15 0655  BP: 169/58 170/59 193/61 201/60  Pulse: 67 63 69   Temp: 98.4 F (36.9 C)  98.5 F (36.9 C)   TempSrc: Oral  Oral   Resp: 16 18 18    Height:      Weight:      SpO2: 93% 93% 93%   Manual BP (before AM meds) RUE: 178/70 and LUE 182/78  General exam: pleasant elderly female lying comfortably in bed this morning.  Respiratory system: Clear. No increased work of breathing. Cardiovascular system: S1 & S2 heard, RRR. No JVD, murmurs, gallops, clicks or pedal edema. Telemetry: Sinus rhythm.  Gastrointestinal system: Abdomen is nondistended, soft and nontender. Normal bowel sounds heard. Central nervous system: Alert and oriented. No focal neurological deficits. Extremities: Symmetric 5 x 5 power.  Discharge Instructions      Discharge Instructions    Call MD for:  difficulty breathing, headache or visual disturbances    Complete by:  As directed      Call MD for:  extreme fatigue    Complete by:  As directed      Call MD for:  hives    Complete by:  As directed      Call MD for:  persistant dizziness or light-headedness    Complete by:  As directed      Call MD for:  persistant nausea and vomiting    Complete by:  As directed      Call MD for:  severe uncontrolled pain    Complete by:  As directed      Call MD for:  temperature >100.4    Complete by:  As directed      Call MD for:    Complete by:  As directed   Passing out.     Diet - low sodium heart healthy    Complete by:  As directed      Diet Carb Modified    Complete by:  As  directed      Increase activity slowly    Complete by:  As directed             Medication List    TAKE these medications        ACCU-CHEK FASTCLIX LANCETS Misc     acetaminophen 500 MG tablet  Commonly known as:  TYLENOL  Take 500 mg by mouth every 6 (six) hours as needed (pain).     amLODipine-valsartan 10-320 MG per tablet  Commonly known as:  EXFORGE  Take 1 tablet by mouth daily.     anastrozole 1 MG tablet  Commonly known as:  ARIMIDEX  TAKE 1 TABLET BY MOUTH EVERY DAY     aspirin EC 81 MG tablet  Take 81 mg by mouth daily.     atenolol 50 MG tablet  Commonly known as:  TENORMIN  Take 1 tablet (50 mg total) by mouth 2 (two) times daily.     CALCIUM + D PO  Take 1 tablet by mouth daily after lunch.  cefUROXime 250 MG tablet  Commonly known as:  CEFTIN  Take 1 tablet (250 mg total) by mouth 2 (two) times daily with a meal.     cholecalciferol 1000 UNITS tablet  Commonly known as:  VITAMIN D  Take 1,000 Units by mouth daily after lunch.     ezetimibe 10 MG tablet  Commonly known as:  ZETIA  Take 1 tablet (10 mg total) by mouth daily.     FERROCITE 324 (106 FE) MG Tabs  Generic drug:  Ferrous Fumarate  TAKE 1 TABLET (106 MG OF IRON TOTAL) BY MOUTH DAILY.     fluticasone 50 MCG/ACT nasal spray  Commonly known as:  FLONASE  Place 2 sprays into both nostrils daily as needed for allergies or rhinitis.     FREESTYLE LITE TEST VI  3 (three) times daily as needed (blood sugar monitoring.).     glimepiride 4 MG tablet  Commonly known as:  AMARYL  Take 4 mg by mouth daily with lunch.     levothyroxine 75 MCG tablet  Commonly known as:  SYNTHROID, LEVOTHROID  Take 75 mcg by mouth daily before breakfast.     metFORMIN 500 MG tablet  Commonly known as:  GLUCOPHAGE  Take 500 mg by mouth 2 (two) times daily with a meal.     OVER THE COUNTER MEDICATION  Place 1 drop into both eyes daily as needed (dry eyes).     pantoprazole 40 MG tablet  Commonly  known as:  PROTONIX  Take 40 mg by mouth daily as needed (acid reflux).     potassium chloride 10 MEQ tablet  Commonly known as:  KLOR-CON M10  TAKE 1 TABLET (10 MEQ TOTAL) BY MOUTH DAILY.     vitamin B-12 1000 MCG tablet  Commonly known as:  CYANOCOBALAMIN  Take 1,000 mcg by mouth daily after lunch.       Follow-up Information    Follow up with FULP, CAMMIE, MD. Schedule an appointment as soon as possible for a visit in 1 week.   Specialty:  Family Medicine   Why:  to be seen with repeat labs (CBC & BMP).   Contact information:   [address] [address] [phone]       Follow up with Warren Danes, MD. Schedule an appointment as soon as possible for a visit in 2 weeks.   Specialty:  Cardiology   Contact information:   [address] [place] [address] [phone]        The results of significant diagnostics from this hospitalization (including imaging, microbiology, ancillary and laboratory) are listed below for reference.    Significant Diagnostic Studies: Dg Chest 2 View  05/11/2015   CLINICAL DATA:  Weakness. Symptoms for few days. Initial encounter.  EXAM: CHEST  2 VIEW  COMPARISON:  Chest radiograph 07/06/2014.  FINDINGS: Cardiopericardial silhouette is mildly enlarged. Increased LEFT basilar density is present which appears linear. This could represent atelectasis or pneumonia.  7 mm RIGHT upper lobe faint nodular density is new compared to prior exam from 2015. Given the overlap scapula, this may be artifactual. Attention on follow-up recommended.  IMPRESSION: 1. LEFT basilar opacity, favored to represent atelectasis over airspace disease/pneumonia. 2. Ill-defined 7 mm pulmonary nodule in the RIGHT upper lobe.   Electronically Signed   By: Dereck Ligas M.D.   On: 05/11/2015 11:32   Ct Head Wo Contrast  05/11/2015   CLINICAL DATA:  Fatigue.  Dizziness.  Onset of  symptoms last night.  EXAM: CT HEAD WITHOUT CONTRAST  TECHNIQUE:  Contiguous axial images were obtained from the base of the skull through the vertex without intravenous contrast.  COMPARISON:  CT head 10/11/2010.  FINDINGS: No mass lesion, mass effect, midline shift, hydrocephalus, hemorrhage. No acute territorial cortical ischemia/infarct. Atrophy and chronic ischemic white matter disease is present.  Mucous retention cyst or polyp in the RIGHT maxillary sinus. Mastoid air cells and middle ears appear clear.  IMPRESSION: Atrophy and chronic ischemic white matter disease without acute intracranial abnormality. Similar appearance of the brain compared to prior exam 10/11/2010.   Electronically Signed   By: Dereck Ligas M.D.   On: 05/11/2015 11:48    Microbiology: No results found for this or any previous visit (from the past 240 hour(s)).   Labs: Basic Metabolic Panel:  Recent Labs Lab 05/11/15 1048 05/12/15 0318 05/13/15 0311  NA 133* 135 136  K 3.7 3.5 3.3*  CL 101 101 102  CO2 20* 22 23  GLUCOSE 254* 167* 250*  BUN 10 8 9   CREATININE 0.86 0.73 0.77  CALCIUM 9.0 8.7* 8.6*   Liver Function Tests:  Recent Labs Lab 05/11/15 1048  AST 26  ALT 22  ALKPHOS 82  BILITOT 0.4  PROT 6.5  ALBUMIN 3.5   No results for input(s): LIPASE, AMYLASE in the last 168 hours. No results for input(s): AMMONIA in the last 168 hours. CBC:  Recent Labs Lab 05/11/15 1048 05/12/15 0318  WBC 21.0* 22.8*  NEUTROABS 12.4*  --   HGB 13.4 12.6  HCT 40.4 37.8  MCV 87.1 86.7  PLT 325 300   Cardiac Enzymes: No results for input(s): CKTOTAL, CKMB, CKMBINDEX, TROPONINI in the last 168 hours. BNP: BNP (last 3 results) No results for input(s): BNP in the last 8760 hours.  ProBNP (last 3 results) No results for input(s): PROBNP in the last 8760 hours.  CBG:  Recent Labs Lab 05/13/15 0737 05/13/15 1148 05/13/15 1709 05/13/15 2116 05/14/15 0738  GLUCAP 223* 171* 204* 188* 189*      Signed:  Vernell Leep, MD, FACP, FHM. [facility] Pager [phone]  If 7PM-7AM, please contact night-coverage [email or web address] Password [facility] 05/14/2015, 10:47 AM

## 2015-05-17 ENCOUNTER — Telehealth: Payer: Self-pay | Admitting: *Deleted

## 2015-05-17 ENCOUNTER — Telehealth: Payer: Self-pay | Admitting: Cardiology

## 2015-05-17 NOTE — Telephone Encounter (Signed)
TC from pt's daughter, [name]. She states that patient has been in the hospital for UTI. She was discharged on Saturday, 05/14/15. During hospitalization, pt had a chest xray done and a 27mm nodule was found in the right upper lobe.  Daughter would like to have Dr. Benay Spice review the films and let her know if this is something they need to be concerned about. Call back #  is [phone]

## 2015-05-17 NOTE — Telephone Encounter (Signed)
New Message   Pt daughter calling to speak w/ RN. Pt was offered next avail appt w/ PA, and requested to speak w/ RN concerning post hosp f/u- per DX summary- pt is to f/u w/ Dr. Darlin Coco, Cardiology in 2 weeks. Please call back and discuss.

## 2015-05-17 NOTE — Telephone Encounter (Signed)
Scheduled post hospital ov, daughter aware of date and time

## 2015-05-26 ENCOUNTER — Ambulatory Visit: Payer: Medicare Other | Admitting: Cardiology

## 2015-05-28 ENCOUNTER — Encounter (HOSPITAL_COMMUNITY): Payer: Self-pay | Admitting: Emergency Medicine

## 2015-05-28 ENCOUNTER — Emergency Department (HOSPITAL_COMMUNITY): Payer: Medicare Other

## 2015-05-28 ENCOUNTER — Emergency Department (HOSPITAL_COMMUNITY)
Admission: EM | Admit: 2015-05-28 | Discharge: 2015-05-28 | Disposition: A | Discharge status: Home / Self Care | Payer: Medicare Other | Attending: Emergency Medicine | Admitting: Emergency Medicine

## 2015-05-28 DIAGNOSIS — Z7982 Long term (current) use of aspirin: Secondary | ICD-10-CM | POA: Insufficient documentation

## 2015-05-28 DIAGNOSIS — R0602 Shortness of breath: Secondary | ICD-10-CM

## 2015-05-28 DIAGNOSIS — N390 Urinary tract infection, site not specified: Secondary | ICD-10-CM | POA: Diagnosis not present

## 2015-05-28 DIAGNOSIS — Z79899 Other long term (current) drug therapy: Secondary | ICD-10-CM | POA: Diagnosis not present

## 2015-05-28 DIAGNOSIS — Z853 Personal history of malignant neoplasm of breast: Secondary | ICD-10-CM | POA: Insufficient documentation

## 2015-05-28 DIAGNOSIS — R739 Hyperglycemia, unspecified: Secondary | ICD-10-CM

## 2015-05-28 DIAGNOSIS — R531 Weakness: Secondary | ICD-10-CM | POA: Diagnosis present

## 2015-05-28 DIAGNOSIS — I10 Essential (primary) hypertension: Secondary | ICD-10-CM | POA: Diagnosis not present

## 2015-05-28 DIAGNOSIS — R197 Diarrhea, unspecified: Secondary | ICD-10-CM

## 2015-05-28 DIAGNOSIS — Z862 Personal history of diseases of the blood and blood-forming organs and certain disorders involving the immune mechanism: Secondary | ICD-10-CM | POA: Insufficient documentation

## 2015-05-28 DIAGNOSIS — R011 Cardiac murmur, unspecified: Secondary | ICD-10-CM | POA: Diagnosis not present

## 2015-05-28 DIAGNOSIS — R05 Cough: Secondary | ICD-10-CM | POA: Insufficient documentation

## 2015-05-28 DIAGNOSIS — E1165 Type 2 diabetes mellitus with hyperglycemia: Secondary | ICD-10-CM | POA: Diagnosis not present

## 2015-05-28 DIAGNOSIS — M159 Polyosteoarthritis, unspecified: Secondary | ICD-10-CM | POA: Insufficient documentation

## 2015-05-28 DIAGNOSIS — E780 Pure hypercholesterolemia: Secondary | ICD-10-CM | POA: Insufficient documentation

## 2015-05-28 DIAGNOSIS — E039 Hypothyroidism, unspecified: Secondary | ICD-10-CM | POA: Diagnosis not present

## 2015-05-28 LAB — URINALYSIS, ROUTINE W REFLEX MICROSCOPIC
Bilirubin Urine: NEGATIVE
Glucose, UA: 500 mg/dL — AB
Hgb urine dipstick: NEGATIVE
KETONES UR: NEGATIVE mg/dL
NITRITE: POSITIVE — AB
PH: 6.5 (ref 5.0–8.0)
Protein, ur: 30 mg/dL — AB
Specific Gravity, Urine: 1.009 (ref 1.005–1.030)
UROBILINOGEN UA: 0.2 mg/dL (ref 0.0–1.0)

## 2015-05-28 LAB — CBC
HCT: 40.1 % (ref 36.0–46.0)
Hemoglobin: 13.3 g/dL (ref 12.0–15.0)
MCH: 28.7 pg (ref 26.0–34.0)
MCHC: 33.2 g/dL (ref 30.0–36.0)
MCV: 86.6 fL (ref 78.0–100.0)
PLATELETS: 296 10*3/uL (ref 150–400)
RBC: 4.63 MIL/uL (ref 3.87–5.11)
RDW: 13.3 % (ref 11.5–15.5)
WBC: 18.8 10*3/uL — ABNORMAL HIGH (ref 4.0–10.5)

## 2015-05-28 LAB — BASIC METABOLIC PANEL
ANION GAP: 11 (ref 5–15)
BUN: 7 mg/dL (ref 6–20)
CHLORIDE: 100 mmol/L — AB (ref 101–111)
CO2: 23 mmol/L (ref 22–32)
CREATININE: 0.82 mg/dL (ref 0.44–1.00)
Calcium: 9.2 mg/dL (ref 8.9–10.3)
GFR calc Af Amer: >60 mL/min (ref >60)
GFR calc non Af Amer: >60 mL/min (ref >60)
Glucose, Bld: 269 mg/dL — ABNORMAL HIGH (ref 65–99)
Potassium: 3.8 mmol/L (ref 3.5–5.1)
Sodium: 134 mmol/L — ABNORMAL LOW (ref 135–145)

## 2015-05-28 LAB — I-STAT TROPONIN, ED: Troponin i, poc: 0 ng/mL (ref 0.00–0.08)

## 2015-05-28 LAB — URINE MICROSCOPIC-ADD ON

## 2015-05-28 LAB — TROPONIN I: Troponin I: 0.03 ng/mL (ref <0.031)

## 2015-05-28 LAB — CBG MONITORING, ED: Glucose-Capillary: 253 mg/dL — ABNORMAL HIGH (ref 65–99)

## 2015-05-28 MED ORDER — DEXTROSE 5 % IV SOLN
1.0000 g | Freq: Once | INTRAVENOUS | Status: AC
  Administered 2015-05-28: 1 g via INTRAVENOUS
  Filled 2015-05-28: qty 10

## 2015-05-28 MED ORDER — CEPHALEXIN 500 MG PO CAPS: 500.0000 mg | ORAL_CAPSULE | Freq: Three times a day (TID) | ORAL | Status: DC

## 2015-05-28 MED ORDER — SODIUM CHLORIDE 0.9 % IV BOLUS (SEPSIS)
500.0000 mL | Freq: Once | INTRAVENOUS | Status: AC
  Administered 2015-05-28: 500 mL via INTRAVENOUS

## 2015-05-28 NOTE — ED Notes (Addendum)
Per EMS: weak for about a week, rec dc for uti.  Has had some sob as well, 98% on ra with ems, warm to touch, lungs clear.  96 degrees temporal.  97.8 orally here.  Hr 79, 160/90.   cbg 285, ate not too long ago.  12 lead shows RBBB otherwise unremarkable.  Diarrhea for 3 weeks she states, hasn't gone today.  Hypertensive during triage at 188/58.  States she is also short winded but is worried about her daughter who was also brought in at the same time for hypoglycemia.  wob normal but is slightly tachypnic, lung sounds clear.

## 2015-05-28 NOTE — Discharge Instructions (Signed)
It was our pleasure to provide your ER care today - we hope that you feel better.  Rest. Drink adequate water/fluids.  Your blood sugar and blood pressure are high - follow up with primary care doctor in the coming week.  From today's lab tests, it appears you have a urine infection - take antibiotic as prescribed. A urine culture was sent the results of which will be back in 2-3 days, have your doctor follow up on that result then.  Return to ER if worse, new symptoms, fevers, persistent vomiting, severe diarrhea, trouble breathing, other concern.    Urinary Tract Infection Urinary tract infections (UTIs) can develop anywhere along your urinary tract. Your urinary tract is your body's drainage system for removing wastes and extra water. Your urinary tract includes two kidneys, two ureters, a bladder, and a urethra. Your kidneys are a pair of bean-shaped organs. Each kidney is about the size of your fist. They are located below your ribs, one on each side of your spine. CAUSES Infections are caused by microbes, which are microscopic organisms, including fungi, viruses, and bacteria. These organisms are so small that they can only be seen through a microscope. Bacteria are the microbes that most commonly cause UTIs. SYMPTOMS  Symptoms of UTIs may vary by age and gender of the patient and by the location of the infection. Symptoms in young women typically include a frequent and intense urge to urinate and a painful, burning feeling in the bladder or urethra during urination. Older women and men are more likely to be tired, shaky, and weak and have muscle aches and abdominal pain. A fever may mean the infection is in your kidneys. Other symptoms of a kidney infection include pain in your back or sides below the ribs, nausea, and vomiting. DIAGNOSIS To diagnose a UTI, your caregiver will ask you about your symptoms. Your caregiver also will ask to provide a urine sample. The urine sample will be  tested for bacteria and white blood cells. White blood cells are made by your body to help fight infection. TREATMENT  Typically, UTIs can be treated with medication. Because most UTIs are caused by a bacterial infection, they usually can be treated with the use of antibiotics. The choice of antibiotic and length of treatment depend on your symptoms and the type of bacteria causing your infection. HOME CARE INSTRUCTIONS  If you were prescribed antibiotics, take them exactly as your caregiver instructs you. Finish the medication even if you feel better after you have only taken some of the medication.  Drink enough water and fluids to keep your urine clear or pale yellow.  Avoid caffeine, tea, and carbonated beverages. They tend to irritate your bladder.  Empty your bladder often. Avoid holding urine for long periods of time.  Empty your bladder before and after sexual intercourse.  After a bowel movement, women should cleanse from front to back. Use each tissue only once. SEEK MEDICAL CARE IF:   You have back pain.  You develop a fever.  Your symptoms do not begin to resolve within 3 days. SEEK IMMEDIATE MEDICAL CARE IF:   You have severe back pain or lower abdominal pain.  You develop chills.  You have nausea or vomiting.  You have continued burning or discomfort with urination. MAKE SURE YOU:   Understand these instructions.  Will watch your condition.  Will get help right away if you are not doing well or get worse. Document Released: 08/08/2005 Document Revised: 04/29/2012 Document Reviewed:  12/07/2011 [employer] Patient Information 2015 [place], [place]. This information is not intended to replace advice given to you by your health care provider. Make sure you discuss any questions you have with your health care provider.    Hyperglycemia Hyperglycemia occurs when the glucose (sugar) in your blood is too high. Hyperglycemia can happen for many reasons, but it most often  happens to people who do not know they have diabetes or are not managing their diabetes properly.  CAUSES  Whether you have diabetes or not, there are other causes of hyperglycemia. Hyperglycemia can occur when you have diabetes, but it can also occur in other situations that you might not be as aware of, such as: Diabetes  If you have diabetes and are having problems controlling your blood glucose, hyperglycemia could occur because of some of the following reasons:  Not following your meal plan.  Not taking your diabetes medications or not taking it properly.  Exercising less or doing less activity than you normally do.  Being sick. Pre-diabetes  This cannot be ignored. Before people develop Type 2 diabetes, they almost always have "pre-diabetes." This is when your blood glucose levels are higher than normal, but not yet high enough to be diagnosed as diabetes. Research has shown that some long-term damage to the body, especially the heart and circulatory system, may already be occurring during pre-diabetes. If you take action to manage your blood glucose when you have pre-diabetes, you may delay or prevent Type 2 diabetes from developing. Stress  If you have diabetes, you may be "diet" controlled or on oral medications or insulin to control your diabetes. However, you may find that your blood glucose is higher than usual in the hospital whether you have diabetes or not. This is often referred to as "stress hyperglycemia." Stress can elevate your blood glucose. This happens because of hormones put out by the body during times of stress. If stress has been the cause of your high blood glucose, it can be followed regularly by your caregiver. That way he/she can make sure your hyperglycemia does not continue to get worse or progress to diabetes. Steroids  Steroids are medications that act on the infection fighting system (immune system) to block inflammation or infection. One side effect can be a  rise in blood glucose. Most people can produce enough extra insulin to allow for this rise, but for those who cannot, steroids make blood glucose levels go even higher. It is not unusual for steroid treatments to "uncover" diabetes that is developing. It is not always possible to determine if the hyperglycemia will go away after the steroids are stopped. A special blood test called an A1c is sometimes done to determine if your blood glucose was elevated before the steroids were started. SYMPTOMS  Thirsty.  Frequent urination.  Dry mouth.  Blurred vision.  Tired or fatigue.  Weakness.  Sleepy.  Tingling in feet or leg. DIAGNOSIS  Diagnosis is made by monitoring blood glucose in one or all of the following ways:  A1c test. This is a chemical found in your blood.  Fingerstick blood glucose monitoring.  Laboratory results. TREATMENT  First, knowing the cause of the hyperglycemia is important before the hyperglycemia can be treated. Treatment may include, but is not be limited to:  Education.  Change or adjustment in medications.  Change or adjustment in meal plan.  Treatment for an illness, infection, etc.  More frequent blood glucose monitoring.  Change in exercise plan.  Decreasing or stopping steroids.  Lifestyle changes. HOME CARE INSTRUCTIONS   Test your blood glucose as directed.  Exercise regularly. Your caregiver will give you instructions about exercise. Pre-diabetes or diabetes which comes on with stress is helped by exercising.  Eat wholesome, balanced meals. Eat often and at regular, fixed times. Your caregiver or nutritionist will give you a meal plan to guide your sugar intake.  Being at an ideal weight is important. If needed, losing as little as 10 to 15 pounds may help improve blood glucose levels. SEEK MEDICAL CARE IF:   You have questions about medicine, activity, or diet.  You continue to have symptoms (problems such as increased thirst,  urination, or weight gain). SEEK IMMEDIATE MEDICAL CARE IF:   You are vomiting or have diarrhea.  Your breath smells fruity.  You are breathing faster or slower.  You are very sleepy or incoherent.  You have numbness, tingling, or pain in your feet or hands.  You have chest pain.  Your symptoms get worse even though you have been following your caregiver's orders.  If you have any other questions or concerns. Document Released: 04/24/2001 Document Revised: 01/21/2012 Document Reviewed: 02/25/2012 [facility] Patient Information 2015 [place], [place]. This information is not intended to replace advice given to you by your health care provider. Make sure you discuss any questions you have with your health care provider.    Hypertension Hypertension, commonly called high blood pressure, is when the force of blood pumping through your arteries is too strong. Your arteries are the blood vessels that carry blood from your heart throughout your body. A blood pressure reading consists of a higher number over a lower number, such as 110/72. The higher number (systolic) is the pressure inside your arteries when your heart pumps. The lower number (diastolic) is the pressure inside your arteries when your heart relaxes. Ideally you want your blood pressure below 120/80. Hypertension forces your heart to work harder to pump blood. Your arteries may become narrow or stiff. Having hypertension puts you at risk for heart disease, stroke, and other problems.  RISK FACTORS Some risk factors for high blood pressure are controllable. Others are not.  Risk factors you cannot control include:   Race. You may be at higher risk if you are African American.  Age. Risk increases with age.  Gender. Men are at higher risk than women before age 71 years. After age 52, women are at higher risk than men. Risk factors you can control include:  Not getting enough exercise or physical activity.  Being  overweight.  Getting too much fat, sugar, calories, or salt in your diet.  Drinking too much alcohol. SIGNS AND SYMPTOMS Hypertension does not usually cause signs or symptoms. Extremely high blood pressure (hypertensive crisis) may cause headache, anxiety, shortness of breath, and nosebleed. DIAGNOSIS  To check if you have hypertension, your health care provider will measure your blood pressure while you are seated, with your arm held at the level of your heart. It should be measured at least twice using the same arm. Certain conditions can cause a difference in blood pressure between your right and left arms. A blood pressure reading that is higher than normal on one occasion does not mean that you need treatment. If one blood pressure reading is high, ask your health care provider about having it checked again. TREATMENT  Treating high blood pressure includes making lifestyle changes and possibly taking medicine. Living a healthy lifestyle can help lower high blood pressure. You may need to  change some of your habits. Lifestyle changes may include:  Following the DASH diet. This diet is high in fruits, vegetables, and whole grains. It is low in salt, red meat, and added sugars.  Getting at least 2 hours of brisk physical activity every week.  Losing weight if necessary.  Not smoking.  Limiting alcoholic beverages.  Learning ways to reduce stress. If lifestyle changes are not enough to get your blood pressure under control, your health care provider may prescribe medicine. You may need to take more than one. Work closely with your health care provider to understand the risks and benefits. HOME CARE INSTRUCTIONS  Have your blood pressure rechecked as directed by your health care provider.   Take medicines only as directed by your health care provider. Follow the directions carefully. Blood pressure medicines must be taken as prescribed. The medicine does not work as well when you skip  doses. Skipping doses also puts you at risk for problems.   Do not smoke.   Monitor your blood pressure at home as directed by your health care provider. SEEK MEDICAL CARE IF:   You think you are having a reaction to medicines taken.  You have recurrent headaches or feel dizzy.  You have swelling in your ankles.  You have trouble with your vision. SEEK IMMEDIATE MEDICAL CARE IF:  You develop a severe headache or confusion.  You have unusual weakness, numbness, or feel faint.  You have severe chest or abdominal pain.  You vomit repeatedly.  You have trouble breathing. MAKE SURE YOU:   Understand these instructions.  Will watch your condition.  Will get help right away if you are not doing well or get worse. Document Released: 10/29/2005 Document Revised: 03/15/2014 Document Reviewed: 08/21/2013 [facility] Patient Information 2015 [place], [place]. This information is not intended to replace advice given to you by your health care provider. Make sure you discuss any questions you have with your health care provider.    Weakness Weakness is a lack of strength. It may be felt all over the body (generalized) or in one specific part of the body (focal). Some causes of weakness can be serious. You may need further medical evaluation, especially if you are elderly or you have a history of immunosuppression (such as chemotherapy or HIV), kidney disease, heart disease, or diabetes. CAUSES  Weakness can be caused by many different things, including:  Infection.  Physical exhaustion.  Internal bleeding or other blood loss that results in a lack of red blood cells (anemia).  Dehydration. This cause is more common in elderly people.  Side effects or electrolyte abnormalities from medicines, such as pain medicines or sedatives.  Emotional distress, anxiety, or depression.  Circulation problems, especially severe peripheral arterial disease.  Heart disease, such as rapid  atrial fibrillation, bradycardia, or heart failure.  Nervous system disorders, such as Guillain-Barr syndrome, multiple sclerosis, or stroke. DIAGNOSIS  To find the cause of your weakness, your caregiver will take your history and perform a physical exam. Lab tests or X-rays may also be ordered, if needed. TREATMENT  Treatment of weakness depends on the cause of your symptoms and can vary greatly. HOME CARE INSTRUCTIONS   Rest as needed.  Eat a well-balanced diet.  Try to get some exercise every day.  Only take over-the-counter or prescription medicines as directed by your caregiver. SEEK MEDICAL CARE IF:   Your weakness seems to be getting worse or spreads to other parts of your body.  You develop new aches  or pains. SEEK IMMEDIATE MEDICAL CARE IF:   You cannot perform your normal daily activities, such as getting dressed and feeding yourself.  You cannot walk up and down stairs, or you feel exhausted when you do so.  You have shortness of breath or chest pain.  You have difficulty moving parts of your body.  You have weakness in only one area of the body or on only one side of the body.  You have a fever.  You have trouble speaking or swallowing.  You cannot control your bladder or bowel movements.  You have black or bloody vomit or stools. MAKE SURE YOU:  Understand these instructions.  Will watch your condition.  Will get help right away if you are not doing well or get worse. Document Released: 10/29/2005 Document Revised: 04/29/2012 Document Reviewed: 12/28/2011 [facility] Patient Information 2015 [place], [place]. This information is not intended to replace advice given to you by your health care provider. Make sure you discuss any questions you have with your health care provider.

## 2015-05-28 NOTE — ED Provider Notes (Signed)
CSN: [id number]     Arrival date & time 05/28/15  2979 History   First MD Initiated Contact with Patient 05/28/15 0913     Chief Complaint  Patient presents with  . Weakness    previously dc for uti  . Shortness of Breath     (Consider location/radiation/quality/duration/timing/severity/associated sxs/prior Treatment) Patient is a 81 y.o. female presenting with weakness and shortness of breath. The history is provided by the patient.  Weakness Pertinent negatives include no chest pain, no abdominal pain and no headaches.  Shortness of Breath Associated symptoms: cough   Associated symptoms: no abdominal pain, no chest pain, no fever, no headaches, no neck pain, no rash, no sore throat and no vomiting   Patient c/o feeling generally weak in the past 1-2 days. Has had recent diarrhea in past 1-2 weeks, w 1-2 loose stools per day. No profuse or malodorous diarrhea. No abd pain. Mild dysuria. No flank pain. Denies fever or chills. No headache. No chest pain. Occasional non prod cough, no sore throat or other uri c/o. Denies faintness or syncope. No falls. Compliant w normal meds, denies recent change in meds. Also states worried as daughter is currently a patient in ED.     Past Medical History  Diagnosis Date  . Hypertension   . MVP (mitral valve prolapse)   . Hypothyroidism   . Anemia   . Hypercholesterolemia   . Heart murmur   . Type II diabetes mellitus   . Arthritis     "back; knees, hands" (05/11/2015)  . Breast cancer, right breast     "cells went into the blood and caused her hip to break" (05/11/2015)   Past Surgical History  Procedure Laterality Date  . Hemiarthroplasty hip Right     "partial replacement"  . Pelvic and para-aortic lymph node dissection    . Tonsillectomy    . Appendectomy    . Cholecystectomy    . US echocardiography  03/30/2009    EF 55-60%  . Cardiovascular stress test  01/28/2008    EF 74%  . Esophagogastroduodenoscopy N/A 07/08/2014   Procedure: ESOPHAGOGASTRODUODENOSCOPY (EGD);  Surgeon: Winfield Cunas., MD;  Location: [facility];  Service: Endoscopy;  Laterality: N/A;  . Colonoscopy N/A 07/10/2014    Procedure: COLONOSCOPY;  Surgeon: Lear Ng, MD;  Location: [facility];  Service: Endoscopy;  Laterality: N/A;  . Total abdominal hysterectomy      w/BSO  . Dilation and curettage of uterus    . Breast biopsy Right 2009  . Breast lumpectomy Right 2009  . Fracture surgery    . Cataract extraction w/ intraocular lens  implant, bilateral Bilateral    Family History  Problem Relation Age of Onset  . Hypertension Mother   . Heart attack Father   . Hypertension Father   . Diabetes Sister   . Hypertension Sister   . Liver cancer Sister   . Heart attack Brother   . Hypertension Brother   . Diabetes Brother   . Diabetes Sister   . Hypertension Sister    History  Substance Use Topics  . Smoking status: Never Smoker   . Smokeless tobacco: Never Used  . Alcohol Use: No   OB History    No data available     Review of Systems  Constitutional: Negative for fever and chills.  HENT: Negative for sore throat.   Eyes: Negative for redness and visual disturbance.  Respiratory: Positive for cough.   Cardiovascular: Negative for chest  pain.  Gastrointestinal: Negative for vomiting, abdominal pain and constipation.  Endocrine: Negative for polyuria.  Genitourinary: Positive for dysuria. Negative for flank pain.  Musculoskeletal: Negative for back pain and neck pain.  Skin: Negative for rash.  Neurological: Positive for weakness. Negative for syncope, speech difficulty, numbness and headaches.  Hematological: Does not bruise/bleed easily.  Psychiatric/Behavioral: Negative for confusion.      Allergies  Amlodipine; Codeine; Demerol; Librium; Lipitor; and Metoprolol  Home Medications   Prior to Admission medications   Medication Sig Start Date End Date Taking? Authorizing Provider  acetaminophen  (TYLENOL) 500 MG tablet Take 500 mg by mouth every 6 (six) hours as needed (pain).   Yes Historical Provider, MD  amLODipine-valsartan (EXFORGE) 10-320 MG per tablet Take 1 tablet by mouth daily. 03/22/15  Yes Darlin Coco, MD  anastrozole (ARIMIDEX) 1 MG tablet TAKE 1 TABLET BY MOUTH EVERY DAY Patient taking differently: TAKE 1MG  BY MOUTH EVERY DAY 02/28/15  Yes Ladell Pier, MD  aspirin EC 81 MG tablet Take 81 mg by mouth daily.   Yes Historical Provider, MD  atenolol (TENORMIN) 50 MG tablet Take 1 tablet (50 mg total) by mouth 2 (two) times daily. 09/07/14  Yes Darlin Coco, MD  Calcium Carbonate-Vitamin D (CALCIUM + D PO) Take 1 tablet by mouth daily after lunch.    Yes Historical Provider, MD  cholecalciferol (VITAMIN D) 1000 UNITS tablet Take 1,000 Units by mouth daily after lunch.    Yes Historical Provider, MD  Cyanocobalamin (B-12) 500 MCG TABS Take 500 mcg by mouth daily.   Yes Historical Provider, MD  ezetimibe (ZETIA) 10 MG tablet Take 1 tablet (10 mg total) by mouth daily. Patient taking differently: Take 10 mg by mouth daily at 6 PM.  09/07/14  Yes Darlin Coco, MD  glimepiride (AMARYL) 4 MG tablet Take 4 mg by mouth daily with lunch.   Yes Historical Provider, MD  levothyroxine (SYNTHROID, LEVOTHROID) 75 MCG tablet Take 75 mcg by mouth daily before breakfast.    Yes Historical Provider, MD  metFORMIN (GLUCOPHAGE) 500 MG tablet Take 500 mg by mouth 2 (two) times daily with a meal.   Yes Historical Provider, MD  potassium chloride (KLOR-CON M10) 10 MEQ tablet TAKE 1 TABLET (10 MEQ TOTAL) BY MOUTH DAILY. Patient taking differently: Take 10 mEq by mouth daily before supper.  09/07/14  Yes Darlin Coco, MD  cefUROXime (CEFTIN) 250 MG tablet Take 1 tablet (250 mg total) by mouth 2 (two) times daily with a meal. Patient not taking: Reported on 05/28/2015 05/14/15   Modena Jansky, MD  FERROCITE 324 MG TABS TAKE 1 TABLET (106 MG OF IRON TOTAL) BY MOUTH DAILY. Patient not  taking: Reported on 05/28/2015 02/28/15   Ladell Pier, MD   BP 176/65 mmHg  Pulse 67  Temp(Src) 99.3 F (37.4 C) (Rectal)  Resp 19  Ht 5\' 5"  (1.651 m)  Wt 191 lb (86.637 kg)  BMI 31.78 kg/m2  SpO2 99% Physical Exam  Constitutional: She appears well-developed and well-nourished. No distress.  HENT:  Head: Atraumatic.  Mouth/Throat: Oropharynx is clear and moist.  Eyes: Conjunctivae are normal. Pupils are equal, round, and reactive to light. No scleral icterus.  Neck: Neck supple. No tracheal deviation present. No thyromegaly present.  No stiffness or rigidity. No bruit.   Cardiovascular: Normal rate, regular rhythm, normal heart sounds and intact distal pulses.  Exam reveals no gallop and no friction rub.   No murmur heard. Pulmonary/Chest: Effort normal and breath  sounds normal. No respiratory distress.  Abdominal: Soft. Normal appearance and bowel sounds are normal. She exhibits no distension and no mass. There is no tenderness. There is no rebound and no guarding.  Genitourinary:  No cva tenderness  Musculoskeletal: She exhibits no edema or tenderness.  Neurological: She is alert. No cranial nerve deficit.  Alert, speech clear, fluent. Motor intact bil, str 5/5. sens grossly intact.   Skin: Skin is warm and dry. No rash noted. She is not diaphoretic.  Psychiatric: She has a normal mood and affect.  Nursing note and vitals reviewed.   ED Course  Procedures (including critical care time) Labs Review   Results for orders placed or performed during the hospital encounter of 63/01/60  Basic metabolic panel  Result Value Ref Range   Sodium 134 (L) 135 - 145 mmol/L   Potassium 3.8 3.5 - 5.1 mmol/L   Chloride 100 (L) 101 - 111 mmol/L   CO2 23 22 - 32 mmol/L   Glucose, Bld 269 (H) 65 - 99 mg/dL   BUN 7 6 - 20 mg/dL   Creatinine, Ser 0.82 0.44 - 1.00 mg/dL   Calcium 9.2 8.9 - 10.3 mg/dL   GFR calc non Af Amer >60 >60 mL/min   GFR calc Af Amer >60 >60 mL/min   Anion gap 11  5 - 15  CBC  Result Value Ref Range   WBC 18.8 (H) 4.0 - 10.5 K/uL   RBC 4.63 3.87 - 5.11 MIL/uL   Hemoglobin 13.3 12.0 - 15.0 g/dL   HCT 40.1 36.0 - 46.0 %   MCV 86.6 78.0 - 100.0 fL   MCH 28.7 26.0 - 34.0 pg   MCHC 33.2 30.0 - 36.0 g/dL   RDW 13.3 11.5 - 15.5 %   Platelets 296 150 - 400 K/uL  Urinalysis, Routine w reflex microscopic (not at [name]  Result Value Ref Range   Color, Urine YELLOW YELLOW   APPearance CLEAR CLEAR   Specific Gravity, Urine 1.009 1.005 - 1.030   pH 6.5 5.0 - 8.0   Glucose, UA 500 (A) NEGATIVE mg/dL   Hgb urine dipstick NEGATIVE NEGATIVE   Bilirubin Urine NEGATIVE NEGATIVE   Ketones, ur NEGATIVE NEGATIVE mg/dL   Protein, ur 30 (A) NEGATIVE mg/dL   Urobilinogen, UA 0.2 0.0 - 1.0 mg/dL   Nitrite POSITIVE (A) NEGATIVE   Leukocytes, UA TRACE (A) NEGATIVE  Troponin I  Result Value Ref Range   Troponin I 0.03 <0.031 ng/mL  Urine microscopic-add on  Result Value Ref Range   Squamous Epithelial / LPF RARE RARE   WBC, UA 7-10 <3 WBC/hpf   RBC / HPF 3-6 <3 RBC/hpf   Bacteria, UA MANY (A) RARE  CBG monitoring, ED  Result Value Ref Range   Glucose-Capillary 253 (H) 65 - 99 mg/dL   Dg Chest 2 View  05/28/2015   CLINICAL DATA:  Pt complains of shortness of breath since yesterday; pt also complains of generalized weakness today; she also states she's had diarrhea for several weeks; non-smoker; pt states she has h/o HTN and diabetes  EXAM: CHEST  2 VIEW  COMPARISON:  05/11/2015  FINDINGS: Cardiac silhouette borderline enlarged. No mediastinal or hilar masses or evidence of adenopathy.  Subtle nodule right upper lobe is unchanged from the recent prior exam. No lung consolidation or edema. No pleural effusion or pneumothorax.  Bony thorax is demineralized. Old healed rib fracture on the left, stable.  IMPRESSION: 1. No acute cardiopulmonary disease. 2. Subtle nodule again  suggested in the right upper lobe. Recommend followup chest radiograph in 3 months to reassess  versus follow-up unenhanced chest CT.   Electronically Signed   By: Lajean Manes M.D.   On: 05/28/2015 10:01        EKG Interpretation   Date/Time:  Saturday May 28 2015 08:55:50 EDT Ventricular Rate:  75 PR Interval:  167 QRS Duration: 150 QT Interval:  427 QTC Calculation: 477 R Axis:   0 Text Interpretation:  Sinus rhythm Multiform ventricular premature  complexes Right bundle branch block Confirmed by Larry Alcock  MD, Lennette Bihari ([id number])  on 05/28/2015 9:03:06 AM      MDM   Iv ns bolus.   Possible uti on labs. Rocephin iv. Urine culture sent.  Recheck, pt tolerating po fluids.  No nv. Afeb. No sob.  Pt alert, voices no current c/o, feels improved.  Pt currently appears stable for d/c.  rec close pcp f/u, re uti, hyperglycemia (appears chronic), CLL.      Lajean Saver, MD 05/28/15 1215

## 2015-05-28 NOTE — ED Notes (Signed)
Pt reports SOB, and generalized weakness, also states diarrhea for several weeks. Respirations unlabored. Lung sounds clear and equal bilaterally at the time. Pt is alert and oriented x4.

## 2015-05-31 LAB — URINE CULTURE: Culture: >=100000

## 2015-06-01 ENCOUNTER — Telehealth: Payer: Self-pay | Admitting: Emergency Medicine

## 2015-06-01 NOTE — Telephone Encounter (Signed)
Post ED Visit - Positive Culture Follow-up  Culture report reviewed by antimicrobial stewardship pharmacist: []  Wes [place], Pharm.D., BCPS []  Heide Guile, Pharm.D., BCPS []  Alycia Rossetti, Pharm.D., BCPS []  [place], Pharm.D., BCPS, AAHIVP []  Legrand Como, Pharm.D., BCPS, AAHIVP [x]  Levester Fresh, Pharm.D., BCPS  Positive Urine culture Treated with Cephalexin, organism sensitive to the same and no further patient follow-up is required at this time.  Molly [name] 06/01/2015, 9:39 AM

## 2015-06-04 ENCOUNTER — Emergency Department (HOSPITAL_COMMUNITY)
Admission: EM | Admit: 2015-06-04 | Discharge: 2015-06-04 | Disposition: A | Discharge status: Home / Self Care | Payer: Medicare Other | Attending: Emergency Medicine | Admitting: Emergency Medicine

## 2015-06-04 ENCOUNTER — Encounter (HOSPITAL_COMMUNITY): Payer: Self-pay

## 2015-06-04 DIAGNOSIS — R110 Nausea: Secondary | ICD-10-CM | POA: Diagnosis not present

## 2015-06-04 DIAGNOSIS — R531 Weakness: Secondary | ICD-10-CM | POA: Diagnosis not present

## 2015-06-04 DIAGNOSIS — E039 Hypothyroidism, unspecified: Secondary | ICD-10-CM | POA: Diagnosis not present

## 2015-06-04 DIAGNOSIS — Z862 Personal history of diseases of the blood and blood-forming organs and certain disorders involving the immune mechanism: Secondary | ICD-10-CM | POA: Diagnosis not present

## 2015-06-04 DIAGNOSIS — Z79899 Other long term (current) drug therapy: Secondary | ICD-10-CM | POA: Insufficient documentation

## 2015-06-04 DIAGNOSIS — Z853 Personal history of malignant neoplasm of breast: Secondary | ICD-10-CM | POA: Diagnosis not present

## 2015-06-04 DIAGNOSIS — R300 Dysuria: Secondary | ICD-10-CM | POA: Diagnosis present

## 2015-06-04 DIAGNOSIS — E1165 Type 2 diabetes mellitus with hyperglycemia: Secondary | ICD-10-CM | POA: Insufficient documentation

## 2015-06-04 DIAGNOSIS — R1030 Lower abdominal pain, unspecified: Secondary | ICD-10-CM | POA: Diagnosis not present

## 2015-06-04 DIAGNOSIS — R011 Cardiac murmur, unspecified: Secondary | ICD-10-CM | POA: Diagnosis not present

## 2015-06-04 DIAGNOSIS — I10 Essential (primary) hypertension: Secondary | ICD-10-CM | POA: Diagnosis not present

## 2015-06-04 DIAGNOSIS — R739 Hyperglycemia, unspecified: Secondary | ICD-10-CM

## 2015-06-04 DIAGNOSIS — M1990 Unspecified osteoarthritis, unspecified site: Secondary | ICD-10-CM | POA: Diagnosis not present

## 2015-06-04 DIAGNOSIS — E780 Pure hypercholesterolemia: Secondary | ICD-10-CM | POA: Insufficient documentation

## 2015-06-04 DIAGNOSIS — Z7982 Long term (current) use of aspirin: Secondary | ICD-10-CM | POA: Diagnosis not present

## 2015-06-04 LAB — CBC WITH DIFFERENTIAL/PLATELET
BASOS ABS: 0 10*3/uL (ref 0.0–0.1)
BASOS PCT: 0 % (ref 0–1)
EOS ABS: 0.2 10*3/uL (ref 0.0–0.7)
EOS PCT: 1 % (ref 0–5)
HEMATOCRIT: 39.3 % (ref 36.0–46.0)
HEMOGLOBIN: 13.3 g/dL (ref 12.0–15.0)
LYMPHS ABS: 9.5 10*3/uL — AB (ref 0.7–4.0)
Lymphocytes Relative: 43 % (ref 12–46)
MCH: 29.6 pg (ref 26.0–34.0)
MCHC: 33.8 g/dL (ref 30.0–36.0)
MCV: 87.3 fL (ref 78.0–100.0)
Monocytes Absolute: 0.9 10*3/uL (ref 0.1–1.0)
Monocytes Relative: 4 % (ref 3–12)
Neutro Abs: 11.4 10*3/uL — ABNORMAL HIGH (ref 1.7–7.7)
Neutrophils Relative %: 52 % (ref 43–77)
Platelets: 308 10*3/uL (ref 150–400)
RBC: 4.5 MIL/uL (ref 3.87–5.11)
RDW: 13.4 % (ref 11.5–15.5)
WBC: 22 10*3/uL — ABNORMAL HIGH (ref 4.0–10.5)

## 2015-06-04 LAB — URINALYSIS, ROUTINE W REFLEX MICROSCOPIC
BILIRUBIN URINE: NEGATIVE
GLUCOSE, UA: 250 mg/dL — AB
HGB URINE DIPSTICK: NEGATIVE
KETONES UR: NEGATIVE mg/dL
Leukocytes, UA: NEGATIVE
Nitrite: NEGATIVE
PROTEIN: 30 mg/dL — AB
Specific Gravity, Urine: 1.014 (ref 1.005–1.030)
Urobilinogen, UA: 0.2 mg/dL (ref 0.0–1.0)
pH: 7 (ref 5.0–8.0)

## 2015-06-04 LAB — BASIC METABOLIC PANEL
ANION GAP: 10 (ref 5–15)
BUN: 10 mg/dL (ref 6–20)
CHLORIDE: 100 mmol/L — AB (ref 101–111)
CO2: 23 mmol/L (ref 22–32)
CREATININE: 0.76 mg/dL (ref 0.44–1.00)
Calcium: 9 mg/dL (ref 8.9–10.3)
GFR calc non Af Amer: >60 mL/min (ref >60)
GLUCOSE: 249 mg/dL — AB (ref 65–99)
Potassium: 3.4 mmol/L — ABNORMAL LOW (ref 3.5–5.1)
Sodium: 133 mmol/L — ABNORMAL LOW (ref 135–145)

## 2015-06-04 LAB — I-STAT TROPONIN, ED: Troponin i, poc: 0 ng/mL (ref 0.00–0.08)

## 2015-06-04 MED ORDER — SODIUM CHLORIDE 0.9 % IV BOLUS (SEPSIS)
500.0000 mL | Freq: Once | INTRAVENOUS | Status: AC
  Administered 2015-06-04: 500 mL via INTRAVENOUS

## 2015-06-04 NOTE — ED Provider Notes (Addendum)
CSN: [id number]     Arrival date & time 06/04/15  7672 History   First MD Initiated Contact with Patient 06/04/15 [phone]     Chief Complaint  Patient presents with  . Urinary Tract Infection     (Consider location/radiation/quality/duration/timing/severity/associated sxs/prior Treatment) Patient is a 81 y.o. female presenting with urinary tract infection. The history is provided by the patient.  Urinary Tract Infection Pain quality:  Aching and burning Pain severity:  Moderate Onset quality:  Gradual Duration:  2 weeks Timing:  Constant Progression:  Worsening Recent urinary tract infections: yes   Relieved by: currently on keflex but feel like things have gotten worse while being on keflex. Ineffective treatments:  Antibiotics Urinary symptoms: frequent urination   Urinary symptoms comment:  Dysuria Associated symptoms: abdominal pain, flank pain and nausea   Associated symptoms: no fever, no vaginal discharge and no vomiting   Associated symptoms comment:  Worsening diarrhea than baseline Risk factors: no urinary catheter   Risk factors comment:  Also pt's primary care giver her daughter has been sick and in the hospital lately and she has not felt well enough to take care of her like she normally does   Past Medical History  Diagnosis Date  . Hypertension   . MVP (mitral valve prolapse)   . Hypothyroidism   . Anemia   . Hypercholesterolemia   . Heart murmur   . Type II diabetes mellitus   . Arthritis     "back; knees, hands" (05/11/2015)  . Breast cancer, right breast     "cells went into the blood and caused her hip to break" (05/11/2015)   Past Surgical History  Procedure Laterality Date  . Hemiarthroplasty hip Right     "partial replacement"  . Pelvic and para-aortic lymph node dissection    . Tonsillectomy    . Appendectomy    . Cholecystectomy    . US echocardiography  03/30/2009    EF 55-60%  . Cardiovascular stress test  01/28/2008    EF 74%  .  Esophagogastroduodenoscopy N/A 07/08/2014    Procedure: ESOPHAGOGASTRODUODENOSCOPY (EGD);  Surgeon: Winfield Cunas., MD;  Location: [facility];  Service: Endoscopy;  Laterality: N/A;  . Colonoscopy N/A 07/10/2014    Procedure: COLONOSCOPY;  Surgeon: Lear Ng, MD;  Location: [facility];  Service: Endoscopy;  Laterality: N/A;  . Total abdominal hysterectomy      w/BSO  . Dilation and curettage of uterus    . Breast biopsy Right 2009  . Breast lumpectomy Right 2009  . Fracture surgery    . Cataract extraction w/ intraocular lens  implant, bilateral Bilateral    Family History  Problem Relation Age of Onset  . Hypertension Mother   . Heart attack Father   . Hypertension Father   . Diabetes Sister   . Hypertension Sister   . Liver cancer Sister   . Heart attack Brother   . Hypertension Brother   . Diabetes Brother   . Diabetes Sister   . Hypertension Sister    History  Substance Use Topics  . Smoking status: Never Smoker   . Smokeless tobacco: Never Used  . Alcohol Use: No   OB History    No data available     Review of Systems  Constitutional: Negative for fever.  Gastrointestinal: Positive for nausea and abdominal pain. Negative for vomiting.  Genitourinary: Positive for flank pain. Negative for vaginal discharge.  All other systems reviewed and are negative.  Allergies  Amlodipine; Codeine; Demerol; Librium; Lipitor; and Metoprolol  Home Medications   Prior to Admission medications   Medication Sig Start Date End Date Taking? Authorizing Provider  acetaminophen (TYLENOL) 500 MG tablet Take 500 mg by mouth every 6 (six) hours as needed (pain).   Yes Historical Provider, MD  amLODipine-valsartan (EXFORGE) 10-320 MG per tablet Take 1 tablet by mouth daily. 03/22/15  Yes Darlin Coco, MD  anastrozole (ARIMIDEX) 1 MG tablet TAKE 1 TABLET BY MOUTH EVERY DAY Patient taking differently: TAKE 1MG  BY MOUTH EVERY DAY 02/28/15  Yes Ladell Pier, MD   aspirin EC 81 MG tablet Take 81 mg by mouth daily.   Yes Historical Provider, MD  atenolol (TENORMIN) 50 MG tablet Take 1 tablet (50 mg total) by mouth 2 (two) times daily. 09/07/14  Yes Darlin Coco, MD  Calcium Carbonate-Vitamin D (CALCIUM + D PO) Take 1 tablet by mouth daily after lunch.    Yes Historical Provider, MD  cephALEXin (KEFLEX) 500 MG capsule Take 1 capsule (500 mg total) by mouth 3 (three) times daily. Patient taking differently: Take 500 mg by mouth 3 (three) times daily. Started 05/28/15, for 7 days ending 06/04/15 05/28/15  Yes Lajean Saver, MD  cholecalciferol (VITAMIN D) 1000 UNITS tablet Take 1,000 Units by mouth daily after lunch.    Yes Historical Provider, MD  Cyanocobalamin (B-12) 500 MCG TABS Take 500 mcg by mouth daily.   Yes Historical Provider, MD  ezetimibe (ZETIA) 10 MG tablet Take 1 tablet (10 mg total) by mouth daily. Patient taking differently: Take 10 mg by mouth daily at 6 PM.  09/07/14  Yes Darlin Coco, MD  FERROCITE 324 MG TABS TAKE 1 TABLET (106 MG OF IRON TOTAL) BY MOUTH DAILY. 02/28/15  Yes Ladell Pier, MD  glimepiride (AMARYL) 4 MG tablet Take 4 mg by mouth daily with lunch.   Yes Historical Provider, MD  levothyroxine (SYNTHROID, LEVOTHROID) 75 MCG tablet Take 75 mcg by mouth daily before breakfast.    Yes Historical Provider, MD  metFORMIN (GLUCOPHAGE) 500 MG tablet Take 500 mg by mouth 2 (two) times daily with a meal.   Yes Historical Provider, MD  potassium chloride (KLOR-CON M10) 10 MEQ tablet TAKE 1 TABLET (10 MEQ TOTAL) BY MOUTH DAILY. Patient taking differently: Take 10 mEq by mouth daily before supper.  09/07/14  Yes Darlin Coco, MD  cefUROXime (CEFTIN) 250 MG tablet Take 1 tablet (250 mg total) by mouth 2 (two) times daily with a meal. Patient not taking: Reported on 05/28/2015 05/14/15   Modena Jansky, MD   BP 182/52 mmHg  Pulse 64  Temp(Src) 98.4 F (36.9 C) (Oral)  Resp 24  Ht 5\' 5"  (1.651 m)  Wt 190 lb (86.183 kg)  BMI  31.62 kg/m2  SpO2 100% Physical Exam  Constitutional: She is oriented to person, place, and time. She appears well-developed and well-nourished. No distress.  HENT:  Head: Normocephalic and atraumatic.  Mouth/Throat: Oropharynx is clear and moist.  Eyes: Conjunctivae and EOM are normal. Pupils are equal, round, and reactive to light.  Neck: Normal range of motion. Neck supple.  Cardiovascular: Normal rate, regular rhythm and intact distal pulses.   No murmur heard. Pulmonary/Chest: Effort normal and breath sounds normal. No respiratory distress. She has no wheezes. She has no rales.  Abdominal: Soft. She exhibits no distension. There is tenderness in the suprapubic area. There is CVA tenderness. There is no rebound and no guarding.  Musculoskeletal: Normal range of motion.  She exhibits no edema or tenderness.  Neurological: She is alert and oriented to person, place, and time.  Skin: Skin is warm and dry. No rash noted. No erythema.  Psychiatric: She has a normal mood and affect. Her behavior is normal.  Nursing note and vitals reviewed.   ED Course  Procedures (including critical care time) Labs Review Labs Reviewed  URINALYSIS, ROUTINE W REFLEX MICROSCOPIC (NOT AT [facility]) - Abnormal; Notable for the following:    Glucose, UA 250 (*)    Protein, ur 30 (*)    All other components within normal limits  CBC WITH DIFFERENTIAL/PLATELET - Abnormal; Notable for the following:    WBC 22.0 (*)    Neutro Abs 11.4 (*)    Lymphs Abs 9.5 (*)    All other components within normal limits  BASIC METABOLIC PANEL - Abnormal; Notable for the following:    Sodium 133 (*)    Potassium 3.4 (*)    Chloride 100 (*)    Glucose, Bld 249 (*)    All other components within normal limits  URINE CULTURE  CLOSTRIDIUM DIFFICILE BY PCR (NOT AT [facility])  URINE MICROSCOPIC-ADD ON  I-STAT TROPOININ, ED    Imaging Review No results found.  ED ECG REPORT   Date: 06/04/2015  Rate: 67   Rhythm: normal sinus  rhythm  QRS Axis: normal  Intervals: normal  ST/T Wave abnormalities: nonspecific ST/T changes  Conduction Disutrbances:right bundle branch block  Narrative Interpretation:   Old EKG Reviewed: unchanged  I have personally reviewed the EKG tracing and agree with the computerized printout as noted.   MDM   Final diagnoses:  Hyperglycemia  Generalized weakness    Patient previously diagnosed with a UTI last week and has been taking Keflex regularly but presents today with generalized weakness, nausea, diarrhea and lower back pain since she's been taking the and bionic. She has chronic diarrhea but states it's worse after taking the Keflex. Also her sugar was 202 today when it normally runs 180. Patient denies any swelling, shortness of breath or new cough. She has deep and very small amounts so that she will not vomit as she is having some nausea. Based on prior notes patient's UTI was susceptible to Keflex. She has normal vital signs today except for mild hypertension. She is not taking her morning medications. Speaking with patient also her primary caregiver who is her daughter has been sick recently and has not been able to care for her as she normally does because she has not felt well.  Patient has lower back pain on exam as well as suprapubic tenderness. On visual inspection there is no evidence of yeast in the vaginal area. Heart and lungs are clear. No abdominal findings concerning for pancreatitis or hepatitis.  Will ensure a urinary tract infection has cleared. Will give IV fluids as patient has not been eating or drinking much. CBC, BMP, UA and culture pending  11:54 AM CBC with leukocytosis of 22,000 which seems to be her chronic baseline for her CLL. She has follow-up with Dr. Benay Spice for this. BMP with normal renal function but hyperglycemia of 250. UA showing cleared infection at this time. Patient's symptoms may be related to the 500 mg Keflex she's taking 3 times a day.     Advised patient to not take her last dose of Keflex. As it appears that her urinary tract infection has cleared.   1:32 PM EKG shows a persistent right bundle branch block but otherwise no acute  findings. Patient has been persistently hypertensive here however when speaking with the patient she states that that is her norm and typically her blood pressures greater than 536 systolic despite taking all of her blood pressure medications. At this time do not find any acute need for patient to be admitted to the hospital. Recommended she follow-up with her PCP for ongoing blood pressure control and recommended that she stop taking the Keflex which is most likely the cause of a lot of her symptoms.  Blanchie Dessert, MD 06/04/15 [place], MD 06/04/15 1453

## 2015-06-04 NOTE — ED Notes (Signed)
Pt seen in ED on 7/16 and diagnosed with UTI.  Pt given Keflex.  Pt reports no relief of symptoms.  Pt c/o suprapubic pain, dysuria and bilateral flank pain.  Pt reports the flank pain began last night.

## 2015-06-06 LAB — URINE CULTURE: Culture: 50000

## 2015-06-07 ENCOUNTER — Telehealth (HOSPITAL_COMMUNITY): Payer: Self-pay

## 2015-06-07 ENCOUNTER — Other Ambulatory Visit: Payer: Self-pay | Admitting: Nurse Practitioner

## 2015-06-07 ENCOUNTER — Telehealth: Payer: Self-pay | Admitting: Nurse Practitioner

## 2015-06-07 DIAGNOSIS — C7951 Secondary malignant neoplasm of bone: Principal | ICD-10-CM

## 2015-06-07 DIAGNOSIS — R911 Solitary pulmonary nodule: Secondary | ICD-10-CM

## 2015-06-07 DIAGNOSIS — C50919 Malignant neoplasm of unspecified site of unspecified female breast: Secondary | ICD-10-CM

## 2015-06-07 NOTE — Telephone Encounter (Signed)
I returned the call to Ms. Lardner daughter, [name]. Dr. Benay Spice has reviewed the chest x-ray and recommends a repeat chest x-ray at the time of her next visit on 08/12/2015. [name] reports that Ms. Soulliere was recently diagnosed with a urinary tract infection and is very weak. I recommended they follow-up with her PCP or we could see her in our office. She does not feel she can bring her mother to an appointment due to the weakness. She understands to seek emergency evaluation if there is any deterioration in her condition.

## 2015-06-07 NOTE — ED Notes (Signed)
Post ED Visit - Positive Culture Follow-up: Chart Hand-off to ED Flow Manager  Culture assessed and recommendations reviewed by: [x]  Levester Fresh, Pharm.D., BCPS []  Heide Guile, Pharm.D., BCPS-AQ ID []  Alycia Rossetti, Pharm.D., BCPS []  [place], Pharm.D., BCPS, AAHIVP []  Legrand Como, Pharm .D., BCPS, AAHIVP []  Milus Glazier, Pharm.D.  Positive urine culture  []  Patient discharged without antimicrobial prescription and treatment is now indicated [x]  Organism is resistant to prescribed ED discharge antimicrobial []  Patient with positive blood cultures  Changes discussed with ED provider: Alecia Lemming PA New antibiotic prescription cipro 500 mg po bid x 3 days and follow up with pcp  Attempting to contact pt   Ileene Musa 06/07/2015, 10:58 AM

## 2015-06-07 NOTE — Progress Notes (Signed)
ED Antimicrobial Stewardship Positive Culture Follow Up   Molly [name] is an 81 y.o. female who presented to [facility] on 06/04/2015 with a chief complaint of  Chief Complaint  Patient presents with  . Urinary Tract Infection    Recent Results (from the past 720 hour(s))  Urine culture     Status: None   Collection Time: 05/28/15  9:10 AM  Result Value Ref Range Status   Specimen Description URINE, CATHETERIZED  Final   Special Requests NONE  Final   Culture >=100,000 COLONIES/mL ESCHERICHIA COLI  Final   Report Status 05/31/2015 FINAL  Final   Organism ID, Bacteria ESCHERICHIA COLI  Final      Susceptibility   Escherichia coli - MIC*    AMPICILLIN 8 SENSITIVE Sensitive     CEFAZOLIN <=4 SENSITIVE Sensitive     CEFTRIAXONE <=1 SENSITIVE Sensitive     CIPROFLOXACIN >=4 RESISTANT Resistant     GENTAMICIN <=1 SENSITIVE Sensitive     IMIPENEM <=0.25 SENSITIVE Sensitive     NITROFURANTOIN 32 SENSITIVE Sensitive     TRIMETH/SULFA <=20 SENSITIVE Sensitive     AMPICILLIN/SULBACTAM 4 SENSITIVE Sensitive     PIP/TAZO <=4 SENSITIVE Sensitive     * >=100,000 COLONIES/mL ESCHERICHIA COLI  Urine culture     Status: None   Collection Time: 06/04/15 11:08 AM  Result Value Ref Range Status   Specimen Description URINE, CATHETERIZED  Final   Special Requests NONE  Final   Culture 50,000 COLONIES/mL PSEUDOMONAS AERUGINOSA  Final   Report Status 06/06/2015 FINAL  Final   Organism ID, Bacteria PSEUDOMONAS AERUGINOSA  Final      Susceptibility   Pseudomonas aeruginosa - MIC*    CEFTAZIDIME 4 SENSITIVE Sensitive     CIPROFLOXACIN <=0.25 SENSITIVE Sensitive     GENTAMICIN 8 INTERMEDIATE Intermediate     IMIPENEM 2 SENSITIVE Sensitive     PIP/TAZO 8 SENSITIVE Sensitive     CEFEPIME 8 SENSITIVE Sensitive     * 50,000 COLONIES/mL PSEUDOMONAS AERUGINOSA    [x]  Treated with cephalexin, organism resistant to prescribed antimicrobial [x]  Patient discharged originally without antimicrobial  agent and treatment is now indicated  New antibiotic prescription: Cipro 500 mg BID x 3 days and follow-up with PCP  ED Provider: Carlisle Cater, PA-C  Wynell Balloon 06/07/2015, 8:21 AM Infectious Diseases Pharmacist Phone# [phone]

## 2015-06-11 ENCOUNTER — Telehealth: Payer: Self-pay | Admitting: Emergency Medicine

## 2015-06-12 ENCOUNTER — Telehealth (HOSPITAL_COMMUNITY): Payer: Self-pay | Admitting: Emergency Medicine

## 2015-06-12 ENCOUNTER — Telehealth (HOSPITAL_COMMUNITY): Payer: Self-pay

## 2015-06-12 NOTE — Telephone Encounter (Signed)
Unable to contact by phone regarding lab results, letter sent.

## 2015-06-12 NOTE — Telephone Encounter (Signed)
Pts daughter returned call.  Informed of dx and need for addl tx after receiving permission from mother.  Rx for Cipro called into and given to pharmacist @ [facility] [phone].

## 2015-06-17 ENCOUNTER — Telehealth: Payer: Self-pay | Admitting: *Deleted

## 2015-06-17 NOTE — Telephone Encounter (Addendum)
Message from pt's daughter reporting she has been having recurrent UTIs. Pt is nauseated, doesn't feel well and has abdominal pain. Wonders if this is related to the CLL. Pt was too weak to be seen in PCP office today. They were able to turn in a urine sample to PCP today. Will review with Dr. Benay Spice. Reviewed with Dr. Benay Spice: Follow up with PCP re: UTI and antibiotics. CLL increases risk for infection, there is no indication for treatment right now. Follow up as scheduled. Returned call to Jenkins with this information. She reports her mom feels better this afternoon. She will contact PCP next week for UA results.

## 2015-06-17 NOTE — Telephone Encounter (Signed)
Call from patient's daughter [name] transferred to collaborative.  " I need a call to know what to do.  Mom just finished another round of antibiotics for ongoing UTI.  Discharged from [facility] 05-14-2015.  Says she is hurting in her stomach, nauseated and just doesn't feel good.  Is this from the leukemia?"

## 2015-06-19 ENCOUNTER — Encounter (HOSPITAL_COMMUNITY): Payer: Self-pay | Admitting: Vascular Surgery

## 2015-06-19 ENCOUNTER — Inpatient Hospital Stay (HOSPITAL_COMMUNITY): Payer: Medicare Other

## 2015-06-19 ENCOUNTER — Emergency Department (HOSPITAL_COMMUNITY): Payer: Medicare Other

## 2015-06-19 ENCOUNTER — Inpatient Hospital Stay (HOSPITAL_COMMUNITY)
Admission: EM | Admit: 2015-06-19 | Discharge: 2015-06-21 | DRG: 558 | Disposition: A | Discharge status: Home / Self Care | Payer: Medicare Other | Attending: Internal Medicine | Admitting: Internal Medicine

## 2015-06-19 DIAGNOSIS — E872 Acidosis: Secondary | ICD-10-CM | POA: Diagnosis present

## 2015-06-19 DIAGNOSIS — Z7982 Long term (current) use of aspirin: Secondary | ICD-10-CM

## 2015-06-19 DIAGNOSIS — Z90722 Acquired absence of ovaries, bilateral: Secondary | ICD-10-CM | POA: Diagnosis present

## 2015-06-19 DIAGNOSIS — C50911 Malignant neoplasm of unspecified site of right female breast: Secondary | ICD-10-CM | POA: Diagnosis present

## 2015-06-19 DIAGNOSIS — Z90710 Acquired absence of both cervix and uterus: Secondary | ICD-10-CM | POA: Diagnosis not present

## 2015-06-19 DIAGNOSIS — R109 Unspecified abdominal pain: Secondary | ICD-10-CM

## 2015-06-19 DIAGNOSIS — E039 Hypothyroidism, unspecified: Secondary | ICD-10-CM | POA: Diagnosis present

## 2015-06-19 DIAGNOSIS — Z9049 Acquired absence of other specified parts of digestive tract: Secondary | ICD-10-CM | POA: Diagnosis present

## 2015-06-19 DIAGNOSIS — C9110 Chronic lymphocytic leukemia of B-cell type not having achieved remission: Secondary | ICD-10-CM | POA: Diagnosis present

## 2015-06-19 DIAGNOSIS — Z96641 Presence of right artificial hip joint: Secondary | ICD-10-CM | POA: Diagnosis present

## 2015-06-19 DIAGNOSIS — Z8249 Family history of ischemic heart disease and other diseases of the circulatory system: Secondary | ICD-10-CM

## 2015-06-19 DIAGNOSIS — D649 Anemia, unspecified: Secondary | ICD-10-CM | POA: Diagnosis present

## 2015-06-19 DIAGNOSIS — E038 Other specified hypothyroidism: Secondary | ICD-10-CM | POA: Diagnosis not present

## 2015-06-19 DIAGNOSIS — C7951 Secondary malignant neoplasm of bone: Secondary | ICD-10-CM | POA: Diagnosis present

## 2015-06-19 DIAGNOSIS — C50919 Malignant neoplasm of unspecified site of unspecified female breast: Secondary | ICD-10-CM | POA: Diagnosis not present

## 2015-06-19 DIAGNOSIS — R100 Acute abdomen: Secondary | ICD-10-CM | POA: Diagnosis not present

## 2015-06-19 DIAGNOSIS — E1142 Type 2 diabetes mellitus with diabetic polyneuropathy: Secondary | ICD-10-CM | POA: Diagnosis present

## 2015-06-19 DIAGNOSIS — Z888 Allergy status to other drugs, medicaments and biological substances status: Secondary | ICD-10-CM

## 2015-06-19 DIAGNOSIS — E780 Pure hypercholesterolemia: Secondary | ICD-10-CM | POA: Diagnosis present

## 2015-06-19 DIAGNOSIS — I341 Nonrheumatic mitral (valve) prolapse: Secondary | ICD-10-CM | POA: Diagnosis present

## 2015-06-19 DIAGNOSIS — Z79899 Other long term (current) drug therapy: Secondary | ICD-10-CM | POA: Diagnosis not present

## 2015-06-19 DIAGNOSIS — R935 Abnormal findings on diagnostic imaging of other abdominal regions, including retroperitoneum: Secondary | ICD-10-CM

## 2015-06-19 DIAGNOSIS — Z833 Family history of diabetes mellitus: Secondary | ICD-10-CM | POA: Diagnosis not present

## 2015-06-19 DIAGNOSIS — Z885 Allergy status to narcotic agent status: Secondary | ICD-10-CM | POA: Diagnosis not present

## 2015-06-19 DIAGNOSIS — I10 Essential (primary) hypertension: Secondary | ICD-10-CM | POA: Diagnosis present

## 2015-06-19 DIAGNOSIS — Z87440 Personal history of urinary (tract) infections: Secondary | ICD-10-CM | POA: Diagnosis not present

## 2015-06-19 DIAGNOSIS — M71552 Other bursitis, not elsewhere classified, left hip: Secondary | ICD-10-CM | POA: Diagnosis present

## 2015-06-19 DIAGNOSIS — M1389 Other specified arthritis, multiple sites: Secondary | ICD-10-CM | POA: Diagnosis present

## 2015-06-19 LAB — CBC WITH DIFFERENTIAL/PLATELET
BASOS ABS: 0 10*3/uL (ref 0.0–0.1)
Basophils Relative: 0 % (ref 0–1)
EOS ABS: 0.2 10*3/uL (ref 0.0–0.7)
EOS PCT: 1 % (ref 0–5)
HCT: 44.1 % (ref 36.0–46.0)
Hemoglobin: 14.8 g/dL (ref 12.0–15.0)
LYMPHS ABS: 9.9 10*3/uL — AB (ref 0.7–4.0)
Lymphocytes Relative: 45 % (ref 12–46)
MCH: 29.4 pg (ref 26.0–34.0)
MCHC: 33.6 g/dL (ref 30.0–36.0)
MCV: 87.5 fL (ref 78.0–100.0)
Monocytes Absolute: 0.7 10*3/uL (ref 0.1–1.0)
Monocytes Relative: 3 % (ref 3–12)
NEUTROS ABS: 11.3 10*3/uL — AB (ref 1.7–7.7)
Neutrophils Relative %: 51 % (ref 43–77)
PLATELETS: 267 10*3/uL (ref 150–400)
RBC: 5.04 MIL/uL (ref 3.87–5.11)
RDW: 14 % (ref 11.5–15.5)
WBC: 22.1 10*3/uL — ABNORMAL HIGH (ref 4.0–10.5)

## 2015-06-19 LAB — GLUCOSE, CAPILLARY
Glucose-Capillary: 191 mg/dL — ABNORMAL HIGH (ref 65–99)
Glucose-Capillary: 217 mg/dL — ABNORMAL HIGH (ref 65–99)

## 2015-06-19 LAB — CBG MONITORING, ED: Glucose-Capillary: 184 mg/dL — ABNORMAL HIGH (ref 65–99)

## 2015-06-19 LAB — COMPREHENSIVE METABOLIC PANEL
ALT: 16 U/L (ref 14–54)
ANION GAP: 12 (ref 5–15)
AST: 26 U/L (ref 15–41)
Albumin: 3.4 g/dL — ABNORMAL LOW (ref 3.5–5.0)
Alkaline Phosphatase: 78 U/L (ref 38–126)
BUN: 10 mg/dL (ref 6–20)
CO2: 21 mmol/L — AB (ref 22–32)
Calcium: 9.3 mg/dL (ref 8.9–10.3)
Chloride: 101 mmol/L (ref 101–111)
Creatinine, Ser: 0.76 mg/dL (ref 0.44–1.00)
GFR calc non Af Amer: >60 mL/min (ref >60)
GLUCOSE: 217 mg/dL — AB (ref 65–99)
Potassium: 3.9 mmol/L (ref 3.5–5.1)
SODIUM: 134 mmol/L — AB (ref 135–145)
TOTAL PROTEIN: 6.3 g/dL — AB (ref 6.5–8.1)
Total Bilirubin: 0.7 mg/dL (ref 0.3–1.2)

## 2015-06-19 LAB — I-STAT CG4 LACTIC ACID, ED: Lactic Acid, Venous: 3.64 mmol/L (ref 0.5–2.0)

## 2015-06-19 LAB — URINALYSIS, ROUTINE W REFLEX MICROSCOPIC
BILIRUBIN URINE: NEGATIVE
Glucose, UA: 250 mg/dL — AB
Hgb urine dipstick: NEGATIVE
Ketones, ur: NEGATIVE mg/dL
Leukocytes, UA: NEGATIVE
Nitrite: NEGATIVE
PH: 7 (ref 5.0–8.0)
Protein, ur: NEGATIVE mg/dL
Specific Gravity, Urine: 1.008 (ref 1.005–1.030)
Urobilinogen, UA: 0.2 mg/dL (ref 0.0–1.0)

## 2015-06-19 LAB — LIPASE, BLOOD: Lipase: 30 U/L (ref 22–51)

## 2015-06-19 MED ORDER — EZETIMIBE 10 MG PO TABS
10.0000 mg | ORAL_TABLET | Freq: Every day | ORAL | Status: DC
  Administered 2015-06-19 – 2015-06-20 (×2): 10 mg via ORAL
  Filled 2015-06-19 (×2): qty 1

## 2015-06-19 MED ORDER — IRBESARTAN 300 MG PO TABS
300.0000 mg | ORAL_TABLET | Freq: Every day | ORAL | Status: DC
  Administered 2015-06-20 – 2015-06-21 (×2): 300 mg via ORAL
  Filled 2015-06-19 (×2): qty 1

## 2015-06-19 MED ORDER — SODIUM CHLORIDE 0.9 % IJ SOLN
3.0000 mL | Freq: Two times a day (BID) | INTRAMUSCULAR | Status: DC
  Administered 2015-06-20: 10 mL via INTRAVENOUS
  Administered 2015-06-20: 3 mL via INTRAVENOUS

## 2015-06-19 MED ORDER — ANASTROZOLE 1 MG PO TABS
1.0000 mg | ORAL_TABLET | Freq: Every day | ORAL | Status: DC
  Administered 2015-06-20 – 2015-06-21 (×2): 1 mg via ORAL
  Filled 2015-06-19 (×3): qty 1

## 2015-06-19 MED ORDER — SODIUM CHLORIDE 0.9 % IV BOLUS (SEPSIS)
500.0000 mL | Freq: Once | INTRAVENOUS | Status: AC
  Administered 2015-06-19: 500 mL via INTRAVENOUS

## 2015-06-19 MED ORDER — PIPERACILLIN-TAZOBACTAM 3.375 G IVPB 30 MIN
3.3750 g | Freq: Once | INTRAVENOUS | Status: AC
  Administered 2015-06-20: 3.375 g via INTRAVENOUS
  Filled 2015-06-19: qty 50

## 2015-06-19 MED ORDER — LEVOTHYROXINE SODIUM 75 MCG PO TABS
75.0000 ug | ORAL_TABLET | Freq: Every day | ORAL | Status: DC
  Administered 2015-06-20 – 2015-06-21 (×2): 75 ug via ORAL
  Filled 2015-06-19 (×2): qty 1

## 2015-06-19 MED ORDER — ATENOLOL 50 MG PO TABS
50.0000 mg | ORAL_TABLET | Freq: Two times a day (BID) | ORAL | Status: DC
  Administered 2015-06-19 – 2015-06-21 (×4): 50 mg via ORAL
  Filled 2015-06-19 (×4): qty 1

## 2015-06-19 MED ORDER — POTASSIUM CHLORIDE CRYS ER 10 MEQ PO TBCR
10.0000 meq | EXTENDED_RELEASE_TABLET | Freq: Every day | ORAL | Status: DC
  Administered 2015-06-20: 10 meq via ORAL
  Filled 2015-06-19: qty 1

## 2015-06-19 MED ORDER — ENOXAPARIN SODIUM 40 MG/0.4ML ~~LOC~~ SOLN
40.0000 mg | SUBCUTANEOUS | Status: DC
  Administered 2015-06-19 – 2015-06-20 (×2): 40 mg via SUBCUTANEOUS
  Filled 2015-06-19 (×2): qty 0.4

## 2015-06-19 MED ORDER — LORAZEPAM 2 MG/ML IJ SOLN
0.5000 mg | Freq: Once | INTRAMUSCULAR | Status: AC
  Administered 2015-06-20: 0.5 mg via INTRAVENOUS
  Filled 2015-06-19: qty 1

## 2015-06-19 MED ORDER — ONDANSETRON HCL 4 MG/2ML IJ SOLN: 4.0000 mg | Freq: Four times a day (QID) | INTRAMUSCULAR | Status: DC | PRN

## 2015-06-19 MED ORDER — HYDROMORPHONE HCL 1 MG/ML IJ SOLN
0.5000 mg | INTRAMUSCULAR | Status: DC | PRN
  Administered 2015-06-21: 0.5 mg via INTRAVENOUS
  Filled 2015-06-19: qty 1

## 2015-06-19 MED ORDER — SODIUM CHLORIDE 0.9 % IV SOLN
INTRAVENOUS | Status: DC
  Administered 2015-06-19 – 2015-06-20 (×2): via INTRAVENOUS

## 2015-06-19 MED ORDER — ACETAMINOPHEN 650 MG RE SUPP: 650.0000 mg | Freq: Four times a day (QID) | RECTAL | Status: DC | PRN

## 2015-06-19 MED ORDER — FENTANYL CITRATE (PF) 100 MCG/2ML IJ SOLN
12.5000 ug | Freq: Once | INTRAMUSCULAR | Status: AC
  Administered 2015-06-19: 12.5 ug via INTRAVENOUS
  Filled 2015-06-19: qty 2

## 2015-06-19 MED ORDER — PIPERACILLIN-TAZOBACTAM 3.375 G IVPB
3.3750 g | Freq: Three times a day (TID) | INTRAVENOUS | Status: DC
  Administered 2015-06-20: 3.375 g via INTRAVENOUS
  Filled 2015-06-19 (×3): qty 50

## 2015-06-19 MED ORDER — INSULIN ASPART 100 UNIT/ML ~~LOC~~ SOLN
0.0000 [IU] | Freq: Three times a day (TID) | SUBCUTANEOUS | Status: DC
  Administered 2015-06-20 (×2): 2 [IU] via SUBCUTANEOUS
  Administered 2015-06-20: 5 [IU] via SUBCUTANEOUS
  Administered 2015-06-21: 2 [IU] via SUBCUTANEOUS

## 2015-06-19 MED ORDER — ASPIRIN EC 81 MG PO TBEC
81.0000 mg | DELAYED_RELEASE_TABLET | Freq: Every day | ORAL | Status: DC
  Administered 2015-06-20 – 2015-06-21 (×2): 81 mg via ORAL
  Filled 2015-06-19 (×2): qty 1

## 2015-06-19 MED ORDER — HYDRALAZINE HCL 20 MG/ML IJ SOLN
10.0000 mg | Freq: Four times a day (QID) | INTRAMUSCULAR | Status: DC | PRN
  Administered 2015-06-19 – 2015-06-21 (×3): 10 mg via INTRAVENOUS
  Filled 2015-06-19 (×3): qty 1

## 2015-06-19 MED ORDER — AMLODIPINE BESYLATE-VALSARTAN 10-320 MG PO TABS: 1.0000 | ORAL_TABLET | Freq: Every day | ORAL | Status: DC

## 2015-06-19 MED ORDER — AMLODIPINE BESYLATE 10 MG PO TABS
10.0000 mg | ORAL_TABLET | Freq: Every day | ORAL | Status: DC
  Administered 2015-06-20 – 2015-06-21 (×2): 10 mg via ORAL
  Filled 2015-06-19 (×2): qty 1

## 2015-06-19 MED ORDER — ONDANSETRON HCL 4 MG PO TABS: 4.0000 mg | ORAL_TABLET | Freq: Four times a day (QID) | ORAL | Status: DC | PRN

## 2015-06-19 MED ORDER — ONDANSETRON HCL 4 MG/2ML IJ SOLN
4.0000 mg | Freq: Once | INTRAMUSCULAR | Status: AC
  Administered 2015-06-19: 4 mg via INTRAVENOUS
  Filled 2015-06-19: qty 2

## 2015-06-19 MED ORDER — ACETAMINOPHEN 325 MG PO TABS: 650.0000 mg | ORAL_TABLET | Freq: Four times a day (QID) | ORAL | Status: DC | PRN

## 2015-06-19 MED ORDER — INSULIN ASPART 100 UNIT/ML ~~LOC~~ SOLN
0.0000 [IU] | Freq: Every day | SUBCUTANEOUS | Status: DC
  Administered 2015-06-19: 2 [IU] via SUBCUTANEOUS

## 2015-06-19 NOTE — Progress Notes (Signed)
ANTIBIOTIC CONSULT NOTE - INITIAL  Pharmacy Consult for Zosyn Indication: intra-abdominal infection  Allergies  Allergen Reactions  . Amlodipine Swelling    edema  . Codeine Other (See Comments)    Makes her feel "crazy."  . Demerol Other (See Comments)    Makes her feel "crazy."  . Librium Other (See Comments)    Makes her feel "crazy."  . Lipitor [Atorvastatin Calcium] Other (See Comments)    Makes her feel "crazy."  . Metoprolol Other (See Comments)    Pt was told not to take this but was not told why    Patient Measurements: Weight: 190 lb 0.6 oz (86.2 kg)   Vital Signs: Temp: 98.1 F (36.7 C) (08/07 1202) Temp Source: Oral (08/07 1202) BP: 173/62 mmHg (08/07 1645) Pulse Rate: 70 (08/07 1645) Intake/Output from previous day:   Intake/Output from this shift:    Labs:  Recent Labs  06/19/15 1241  WBC 22.1*  HGB 14.8  PLT 267  CREATININE 0.76   Estimated Creatinine Clearance: 51.7 mL/min (by C-G formula based on Cr of 0.76). No results for input(s): VANCOTROUGH, VANCOPEAK, VANCORANDOM, GENTTROUGH, GENTPEAK, GENTRANDOM, TOBRATROUGH, TOBRAPEAK, TOBRARND, AMIKACINPEAK, AMIKACINTROU, AMIKACIN in the last 72 hours.   Microbiology: Recent Results (from the past 720 hour(s))  Urine culture     Status: None   Collection Time: 05/28/15  9:10 AM  Result Value Ref Range Status   Specimen Description URINE, CATHETERIZED  Final   Special Requests NONE  Final   Culture >=100,000 COLONIES/mL ESCHERICHIA COLI  Final   Report Status 05/31/2015 FINAL  Final   Organism ID, Bacteria ESCHERICHIA COLI  Final      Susceptibility   Escherichia coli - MIC*    AMPICILLIN 8 SENSITIVE Sensitive     CEFAZOLIN <=4 SENSITIVE Sensitive     CEFTRIAXONE <=1 SENSITIVE Sensitive     CIPROFLOXACIN >=4 RESISTANT Resistant     GENTAMICIN <=1 SENSITIVE Sensitive     IMIPENEM <=0.25 SENSITIVE Sensitive     NITROFURANTOIN 32 SENSITIVE Sensitive     TRIMETH/SULFA <=20 SENSITIVE Sensitive      AMPICILLIN/SULBACTAM 4 SENSITIVE Sensitive     PIP/TAZO <=4 SENSITIVE Sensitive     * >=100,000 COLONIES/mL ESCHERICHIA COLI  Urine culture     Status: None   Collection Time: 06/04/15 11:08 AM  Result Value Ref Range Status   Specimen Description URINE, CATHETERIZED  Final   Special Requests NONE  Final   Culture 50,000 COLONIES/mL PSEUDOMONAS AERUGINOSA  Final   Report Status 06/06/2015 FINAL  Final   Organism ID, Bacteria PSEUDOMONAS AERUGINOSA  Final      Susceptibility   Pseudomonas aeruginosa - MIC*    CEFTAZIDIME 4 SENSITIVE Sensitive     CIPROFLOXACIN <=0.25 SENSITIVE Sensitive     GENTAMICIN 8 INTERMEDIATE Intermediate     IMIPENEM 2 SENSITIVE Sensitive     PIP/TAZO 8 SENSITIVE Sensitive     CEFEPIME 8 SENSITIVE Sensitive     * 50,000 COLONIES/mL PSEUDOMONAS AERUGINOSA    Medical History: Past Medical History  Diagnosis Date  . Hypertension   . MVP (mitral valve prolapse)   . Hypothyroidism   . Anemia   . Hypercholesterolemia   . Heart murmur   . Type II diabetes mellitus   . Arthritis     "back; knees, hands" (05/11/2015)  . Breast cancer, right breast     "cells went into the blood and caused her hip to break" (05/11/2015)    Assessment: 15 YOF here with  left abdominal/flank pain. She has a history of recurrent UTI. Imaging of her abdomen revealed a left enlarging iliopsoas cyst.  SCr stable at 0.76, est CrCl ~50-81mL/min. WBC quite elevated at 22.1  Goal of Therapy:  proper dosing based on renal and hepatic function  Plan:  -zosyn 3.375g IV x1 now over 75min, then 3.375g IV q8h EI -follow c/s, imaging, renal function, LOT  Wyatte Dames D. Joua Bake, PharmD, BCPS Clinical Pharmacist Pager: [phone] 06/19/2015 6:35 PM

## 2015-06-19 NOTE — ED Notes (Signed)
Notified Pollina, MD of Critical Lactic Acid Value

## 2015-06-19 NOTE — ED Provider Notes (Signed)
CSN: [id number]     Arrival date & time 06/19/15  1158 History   First MD Initiated Contact with Patient 06/19/15 1158     Chief Complaint  Patient presents with  . Back Pain  . Abdominal Pain     (Consider location/radiation/quality/duration/timing/severity/associated sxs/prior Treatment) HPI Comments: Patient presents to the emergency department for evaluation of continued lower abdominal and left flank pain. Patient reports that she has been seen in the ER multiple times. Patient reports worsening pain in the low abdomen which is diffuse and constant. Pain radiates up into the left lower back area. She has associated nausea. She has been experiencing chills and sweats, but has not taken her temperature. She has had diarrhea, but has not had any rectal bleeding.  Patient is a 81 y.o. female presenting with back pain and abdominal pain.  Back Pain Associated symptoms: abdominal pain and dysuria   Abdominal Pain Associated symptoms: diarrhea, dysuria and nausea   Associated symptoms: no vomiting     Past Medical History  Diagnosis Date  . Hypertension   . MVP (mitral valve prolapse)   . Hypothyroidism   . Anemia   . Hypercholesterolemia   . Heart murmur   . Type II diabetes mellitus   . Arthritis     "back; knees, hands" (05/11/2015)  . Breast cancer, right breast     "cells went into the blood and caused her hip to break" (05/11/2015)   Past Surgical History  Procedure Laterality Date  . Hemiarthroplasty hip Right     "partial replacement"  . Pelvic and para-aortic lymph node dissection    . Tonsillectomy    . Appendectomy    . Cholecystectomy    . US echocardiography  03/30/2009    EF 55-60%  . Cardiovascular stress test  01/28/2008    EF 74%  . Esophagogastroduodenoscopy N/A 07/08/2014    Procedure: ESOPHAGOGASTRODUODENOSCOPY (EGD);  Surgeon: Winfield Cunas., MD;  Location: [facility];  Service: Endoscopy;  Laterality: N/A;  . Colonoscopy N/A 07/10/2014     Procedure: COLONOSCOPY;  Surgeon: Lear Ng, MD;  Location: [facility];  Service: Endoscopy;  Laterality: N/A;  . Total abdominal hysterectomy      w/BSO  . Dilation and curettage of uterus    . Breast biopsy Right 2009  . Breast lumpectomy Right 2009  . Fracture surgery    . Cataract extraction w/ intraocular lens  implant, bilateral Bilateral    Family History  Problem Relation Age of Onset  . Hypertension Mother   . Heart attack Father   . Hypertension Father   . Diabetes Sister   . Hypertension Sister   . Liver cancer Sister   . Heart attack Brother   . Hypertension Brother   . Diabetes Brother   . Diabetes Sister   . Hypertension Sister    History  Substance Use Topics  . Smoking status: Never Smoker   . Smokeless tobacco: Never Used  . Alcohol Use: No   OB History    No data available     Review of Systems  Gastrointestinal: Positive for nausea, abdominal pain and diarrhea. Negative for vomiting.  Genitourinary: Positive for dysuria and flank pain.  Musculoskeletal: Positive for back pain.  All other systems reviewed and are negative.     Allergies  Amlodipine; Codeine; Demerol; Librium; Lipitor; and Metoprolol  Home Medications   Prior to Admission medications   Medication Sig Start Date End Date Taking? Authorizing Provider  acetaminophen (TYLENOL)  500 MG tablet Take 500 mg by mouth every 6 (six) hours as needed (pain).    Historical Provider, MD  amLODipine-valsartan (EXFORGE) 10-320 MG per tablet Take 1 tablet by mouth daily. 03/22/15   Darlin Coco, MD  anastrozole (ARIMIDEX) 1 MG tablet TAKE 1 TABLET BY MOUTH EVERY DAY Patient taking differently: TAKE 1MG  BY MOUTH EVERY DAY 02/28/15   Ladell Pier, MD  aspirin EC 81 MG tablet Take 81 mg by mouth daily.    Historical Provider, MD  atenolol (TENORMIN) 50 MG tablet Take 1 tablet (50 mg total) by mouth 2 (two) times daily. 09/07/14   Darlin Coco, MD  Calcium Carbonate-Vitamin D  (CALCIUM + D PO) Take 1 tablet by mouth daily after lunch.     Historical Provider, MD  cefUROXime (CEFTIN) 250 MG tablet Take 1 tablet (250 mg total) by mouth 2 (two) times daily with a meal. Patient not taking: Reported on 05/28/2015 05/14/15   Modena Jansky, MD  cephALEXin (KEFLEX) 500 MG capsule Take 1 capsule (500 mg total) by mouth 3 (three) times daily. Patient taking differently: Take 500 mg by mouth 3 (three) times daily. Started 05/28/15, for 7 days ending 06/04/15 05/28/15   Lajean Saver, MD  cholecalciferol (VITAMIN D) 1000 UNITS tablet Take 1,000 Units by mouth daily after lunch.     Historical Provider, MD  Cyanocobalamin (B-12) 500 MCG TABS Take 500 mcg by mouth daily.    Historical Provider, MD  ezetimibe (ZETIA) 10 MG tablet Take 1 tablet (10 mg total) by mouth daily. Patient taking differently: Take 10 mg by mouth daily at 6 PM.  09/07/14   Darlin Coco, MD  FERROCITE 324 MG TABS TAKE 1 TABLET (106 MG OF IRON TOTAL) BY MOUTH DAILY. 02/28/15   Ladell Pier, MD  glimepiride (AMARYL) 4 MG tablet Take 4 mg by mouth daily with lunch.    Historical Provider, MD  levothyroxine (SYNTHROID, LEVOTHROID) 75 MCG tablet Take 75 mcg by mouth daily before breakfast.     Historical Provider, MD  metFORMIN (GLUCOPHAGE) 500 MG tablet Take 500 mg by mouth 2 (two) times daily with a meal.    Historical Provider, MD  potassium chloride (KLOR-CON M10) 10 MEQ tablet TAKE 1 TABLET (10 MEQ TOTAL) BY MOUTH DAILY. Patient taking differently: Take 10 mEq by mouth daily before supper.  09/07/14   Darlin Coco, MD   BP 179/77 mmHg  Pulse 65  Temp(Src) 98.1 F (36.7 C) (Oral)  Resp 22  SpO2 98% Physical Exam  Constitutional: She is oriented to person, place, and time. She appears well-developed and well-nourished. No distress.  HENT:  Head: Normocephalic and atraumatic.  Right Ear: Hearing normal.  Left Ear: Hearing normal.  Nose: Nose normal.  Mouth/Throat: Oropharynx is clear and moist and  mucous membranes are normal.  Eyes: Conjunctivae and EOM are normal. Pupils are equal, round, and reactive to light.  Neck: Normal range of motion. Neck supple.  Cardiovascular: Regular rhythm, S1 normal and S2 normal.  Exam reveals no gallop and no friction rub.   No murmur heard. Pulmonary/Chest: Effort normal and breath sounds normal. No respiratory distress. She exhibits no tenderness.  Abdominal: Soft. Normal appearance and bowel sounds are normal. There is no hepatosplenomegaly. There is tenderness in the suprapubic area. There is CVA tenderness. There is no rebound, no guarding, no tenderness at McBurney's point and negative Murphy's sign. No hernia.  Musculoskeletal: Normal range of motion.  Neurological: She is alert and oriented to  person, place, and time. She has normal strength. No cranial nerve deficit or sensory deficit. Coordination normal. GCS eye subscore is 4. GCS verbal subscore is 5. GCS motor subscore is 6.  Skin: Skin is warm, dry and intact. No rash noted. No cyanosis.  Psychiatric: She has a normal mood and affect. Her speech is normal and behavior is normal. Thought content normal.  Nursing note and vitals reviewed.   ED Course  Procedures (including critical care time) Labs Review Labs Reviewed  CBC WITH DIFFERENTIAL/PLATELET - Abnormal; Notable for the following:    WBC 22.1 (*)    Neutro Abs 11.3 (*)    Lymphs Abs 9.9 (*)    All other components within normal limits  COMPREHENSIVE METABOLIC PANEL - Abnormal; Notable for the following:    Sodium 134 (*)    CO2 21 (*)    Glucose, Bld 217 (*)    Total Protein 6.3 (*)    Albumin 3.4 (*)    All other components within normal limits  URINALYSIS, ROUTINE W REFLEX MICROSCOPIC (NOT AT [facility]) - Abnormal; Notable for the following:    Glucose, UA 250 (*)    All other components within normal limits  I-STAT CG4 LACTIC ACID, ED - Abnormal; Notable for the following:    Lactic Acid, Venous 3.64 (*)    All other  components within normal limits  CBG MONITORING, ED - Abnormal; Notable for the following:    Glucose-Capillary 184 (*)    All other components within normal limits  URINE CULTURE  CULTURE, BLOOD (ROUTINE X 2)  CULTURE, BLOOD (ROUTINE X 2)  LIPASE, BLOOD    Imaging Review Ct Abdomen Pelvis Wo Contrast  06/19/2015   CLINICAL DATA:  Acute abdominal pain, LEFT flank pain, UTI for a month, history breast cancer, appendectomy, cholecystectomy, hysterectomy, type II diabetes mellitus  EXAM: CT ABDOMEN AND PELVIS WITHOUT CONTRAST  TECHNIQUE: Multidetector CT imaging of the abdomen and pelvis was performed following the standard protocol without IV contrast. Patient refused oral and IV contrast for this exam. Sagittal and coronal MPR images reconstructed from axial data set.  COMPARISON:  10/19/2013  FINDINGS: Lung bases clear.  Extensive atherosclerotic calcifications including coronary arteries.  Liver, spleen, atrophic pancreas, kidneys, and adrenal glands otherwise unremarkable.  Multiple curvilinear calcifications identified at the peripancreatic region, suspect calcified aneurysms measuring up to 18 x 15 mm in greatest size not significantly changed.  Normal size retroperitoneal nodes.  Cystic lesion at LEFT iliacus muscle measures 6.9 x 5.5 x 7.3 cm, increased.  Beam hardening artifacts from LEFT hip prosthesis obscure portions of pelvis.  Uterus surgically absent with nonvisualization of ovaries and appendix.  Sigmoid diverticulosis without gross evidence of diverticulitis.  Visualized portion urinary bladder unremarkable, portion obscured.  Stomach and remaining bowel loops otherwise grossly normal appearance.  No mass, adenopathy, free air or free fluid.  Multilevel degenerative disc and facet disease changes of the lumbar spine.  Lucent foci within the T12 and L1 vertebral bodies appear unchanged question vertebral hemangiomata.  Sclerotic foci at T12 and L3 vertebral bodies again identified.   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  Interval increase in size of a cystic lesion at the LEFT iliopsoas of uncertain etiology ; in the setting of unexplained acute LEFT flank pain, consider followup MR imaging of this lesion to characterize.  Sigmoid diverticulosis without evidence of diverticulitis.  Extensive coronary arterial calcification.  Stable sclerotic foci of the T12 and L3 vertebral  bodies, question old sclerotic metastases.   Electronically Signed   By: Lavonia Dana M.D.   On: 06/19/2015 16:13     EKG Interpretation   Date/Time:  Sunday June 19 2015 12:54:15 EDT Ventricular Rate:  68 PR Interval:  177 QRS Duration: 150 QT Interval:  433 QTC Calculation: 460 R Axis:   -2 Text Interpretation:  Sinus rhythm Ventricular premature complex Right  bundle branch block No significant change since last tracing Confirmed by  POLLINA  MD, [place] [phone]) on 06/19/2015 12:59:10 PM      MDM   Final diagnoses:  Abdominal pain, acute    Patient presents to the emergency department for complaints of continued pain in the low abdomen and left flank region. She has been seen in the ER for this multiple times. Patient has been treated for urinary tract infection on several occasions. Reviewing her records reveals that her initial urine culture did grow Escherichia coli. Subsequent encounter had a culture which grew Pseudomonas. Her urinalysis today, however, appears to be clear, no obvious infection. Patient is complaining of severe pain, does have diffuse tenderness on the left side of the abdomen. Because of this, CT scan was performed. There is no obvious acute diverticulitis noted, but there is an enlarging cyst in iliopsoas region. Cannot rule out abscess. Will need further workup. Patient does have a lactic acidosis today. Will ask hospitalist to admit for further management.    Orpah Greek, MD 06/19/15 [phone]

## 2015-06-19 NOTE — H&P (Signed)
[facility] History and Physical  MARQUITA LIAS KNL:[id number] DOB: Jul 22, 1923 DOA: 06/19/2015  Referring physician: Dr. Betsey Holiday, ER PCP: Antony Blackbird, MD   Chief Complaint: left abdominal/flank pain  HPI: Molly [name] is a 81 y.o. female who has a history of recurrent urinary tract infections, presents to the ED today with complaints of left flank and left abdominal pain. She reports that she has intermittently had this pain over the last few weeks, gets better when she is on antibiotics and once she is off antibiotics, her discomfort returns. She has had 2 positive urine cultures in the last few weeks with E coli and pseudomonas. She is unsure whether she has had a true fever, but reports that she does have hot flashes. She denies any shortness of breath, cough, vomiting. She has chronic loose stools which her daughter feels are at her baseline. She has had several ER visits for similar complaints in the last few weeks. She was evaluated in the ED where imaging of her abdomen indicated a left enlarging iliopsoas cyst. She has been referred for further work up   Review of Systems:  Pertinent positives as per HPI, otherwise negative  Past Medical History  Diagnosis Date  . Hypertension   . MVP (mitral valve prolapse)   . Hypothyroidism   . Anemia   . Hypercholesterolemia   . Heart murmur   . Type II diabetes mellitus   . Arthritis     "back; knees, hands" (05/11/2015)  . Breast cancer, right breast     "cells went into the blood and caused her hip to break" (05/11/2015)   Past Surgical History  Procedure Laterality Date  . Hemiarthroplasty hip Right     "partial replacement"  . Pelvic and para-aortic lymph node dissection    . Tonsillectomy    . Appendectomy    . Cholecystectomy    . US echocardiography  03/30/2009    EF 55-60%  . Cardiovascular stress test  01/28/2008    EF 74%  . Esophagogastroduodenoscopy N/A 07/08/2014    Procedure: ESOPHAGOGASTRODUODENOSCOPY  (EGD);  Surgeon: Winfield Cunas., MD;  Location: [facility];  Service: Endoscopy;  Laterality: N/A;  . Colonoscopy N/A 07/10/2014    Procedure: COLONOSCOPY;  Surgeon: Lear Ng, MD;  Location: [facility];  Service: Endoscopy;  Laterality: N/A;  . Total abdominal hysterectomy      w/BSO  . Dilation and curettage of uterus    . Breast biopsy Right 2009  . Breast lumpectomy Right 2009  . Fracture surgery    . Cataract extraction w/ intraocular lens  implant, bilateral Bilateral    Social History:  reports that she has never smoked. She has never used smokeless tobacco. She reports that she does not drink alcohol or use illicit drugs.  Allergies  Allergen Reactions  . Amlodipine Swelling    edema  . Codeine Other (See Comments)    Makes her feel "crazy."  . Demerol Other (See Comments)    Makes her feel "crazy."  . Librium Other (See Comments)    Makes her feel "crazy."  . Lipitor [Atorvastatin Calcium] Other (See Comments)    Makes her feel "crazy."  . Metoprolol Other (See Comments)    Pt was told not to take this but was not told why    Family History  Problem Relation Age of Onset  . Hypertension Mother   . Heart attack Father   . Hypertension Father   . Diabetes Sister   .  Hypertension Sister   . Liver cancer Sister   . Heart attack Brother   . Hypertension Brother   . Diabetes Brother   . Diabetes Sister   . Hypertension Sister     Prior to Admission medications   Medication Sig Start Date End Date Taking? Authorizing Provider  acetaminophen (TYLENOL) 500 MG tablet Take 500 mg by mouth every 6 (six) hours as needed (pain).   Yes Historical Provider, MD  amLODipine-valsartan (EXFORGE) 10-320 MG per tablet Take 1 tablet by mouth daily. 03/22/15  Yes Darlin Coco, MD  anastrozole (ARIMIDEX) 1 MG tablet TAKE 1 TABLET BY MOUTH EVERY DAY 02/28/15  Yes Ladell Pier, MD  aspirin EC 81 MG tablet Take 81 mg by mouth daily.   Yes Historical Provider, MD    atenolol (TENORMIN) 50 MG tablet Take 1 tablet (50 mg total) by mouth 2 (two) times daily. 09/07/14  Yes Darlin Coco, MD  Calcium Carbonate-Vitamin D (CALCIUM + D PO) Take 1 tablet by mouth daily after lunch.    Yes Historical Provider, MD  cholecalciferol (VITAMIN D) 1000 UNITS tablet Take 1,000 Units by mouth daily after lunch.    Yes Historical Provider, MD  Cyanocobalamin (B-12) 500 MCG TABS Take 500 mcg by mouth daily after lunch.    Yes Historical Provider, MD  ezetimibe (ZETIA) 10 MG tablet Take 1 tablet (10 mg total) by mouth daily. Patient taking differently: Take 10 mg by mouth daily at 6 PM.  09/07/14  Yes Darlin Coco, MD  FERROCITE 324 MG TABS TAKE 1 TABLET (106 MG OF IRON TOTAL) BY MOUTH DAILY. Patient taking differently: TAKE 1 TABLET (106 MG OF IRON TOTAL) BY MOUTH DAILY AFTER LUNCH 02/28/15  Yes Ladell Pier, MD  glimepiride (AMARYL) 4 MG tablet Take 4 mg by mouth daily with lunch.   Yes Historical Provider, MD  levothyroxine (SYNTHROID, LEVOTHROID) 75 MCG tablet Take 75 mcg by mouth daily before breakfast.    Yes Historical Provider, MD  metFORMIN (GLUCOPHAGE) 500 MG tablet Take 500 mg by mouth 2 (two) times daily with a meal.   Yes Historical Provider, MD  potassium chloride (KLOR-CON M10) 10 MEQ tablet TAKE 1 TABLET (10 MEQ TOTAL) BY MOUTH DAILY. Patient taking differently: Take 10 mEq by mouth daily after lunch.  09/07/14  Yes Darlin Coco, MD   Physical Exam: Filed Vitals:   06/19/15 1515 06/19/15 1538 06/19/15 1630 06/19/15 1645  BP: 184/65 179/77 165/54 173/62  Pulse: 65 65 68 70  Temp:      TempSrc:      Resp: 24 22 30 27   SpO2: 96% 98% 95% 94%    Wt Readings from Last 3 Encounters:  06/04/15 86.183 kg (190 lb)  05/28/15 86.637 kg (191 lb)  05/11/15 90.719 kg (200 lb)    General:  Appears calm and comfortable Eyes: PERRL, normal lids, irises & conjunctiva ENT: grossly normal hearing, lips & tongue Neck: no LAD, masses or  thyromegaly Cardiovascular: RRR, no m/r/g. No LE edema. Telemetry: SR, no arrhythmias  Respiratory: CTA bilaterally, no w/r/r. Normal respiratory effort. Abdomen: soft, tenderness over left flank, bs+ Skin: no rash or induration seen on limited exam Musculoskeletal: grossly normal tone BUE/BLE Psychiatric: grossly normal mood and affect, speech fluent and appropriate Neurologic: grossly non-focal.          Labs on Admission:  Basic Metabolic Panel:  Recent Labs Lab 06/19/15 1241  NA 134*  K 3.9  CL 101  CO2 21*  GLUCOSE 217*  BUN 10  CREATININE 0.76  CALCIUM 9.3   Liver Function Tests:  Recent Labs Lab 06/19/15 1241  AST 26  ALT 16  ALKPHOS 78  BILITOT 0.7  PROT 6.3*  ALBUMIN 3.4*    Recent Labs Lab 06/19/15 1241  LIPASE 30   No results for input(s): AMMONIA in the last 168 hours. CBC:  Recent Labs Lab 06/19/15 1241  WBC 22.1*  NEUTROABS 11.3*  HGB 14.8  HCT 44.1  MCV 87.5  PLT 267   Cardiac Enzymes: No results for input(s): CKTOTAL, CKMB, CKMBINDEX, TROPONINI in the last 168 hours.  BNP (last 3 results) No results for input(s): BNP in the last 8760 hours.  ProBNP (last 3 results) No results for input(s): PROBNP in the last 8760 hours.  CBG:  Recent Labs Lab 06/19/15 1318  GLUCAP 184*    Radiological Exams on Admission: Ct Abdomen Pelvis Wo Contrast  06/19/2015   CLINICAL DATA:  Acute abdominal pain, LEFT flank pain, UTI for a month, history breast cancer, appendectomy, cholecystectomy, hysterectomy, type II diabetes mellitus  EXAM: CT ABDOMEN AND PELVIS WITHOUT CONTRAST  TECHNIQUE: Multidetector CT imaging of the abdomen and pelvis was performed following the standard protocol without IV contrast. Patient refused oral and IV contrast for this exam. Sagittal and coronal MPR images reconstructed from axial data set.  COMPARISON:  10/19/2013  FINDINGS: Lung bases clear.  Extensive atherosclerotic calcifications including coronary arteries.   Liver, spleen, atrophic pancreas, kidneys, and adrenal glands otherwise unremarkable.  Multiple curvilinear calcifications identified at the peripancreatic region, suspect calcified aneurysms measuring up to 18 x 15 mm in greatest size not significantly changed.  Normal size retroperitoneal nodes.  Cystic lesion at LEFT iliacus muscle measures 6.9 x 5.5 x 7.3 cm, increased.  Beam hardening artifacts from LEFT hip prosthesis obscure portions of pelvis.  Uterus surgically absent with nonvisualization of ovaries and appendix.  Sigmoid diverticulosis without gross evidence of diverticulitis.  Visualized portion urinary bladder unremarkable, portion obscured.  Stomach and remaining bowel loops otherwise grossly normal appearance.  No mass, adenopathy, free air or free fluid.  Multilevel degenerative disc and facet disease changes of the lumbar spine.  Lucent foci within the T12 and L1 vertebral bodies appear unchanged question vertebral hemangiomata.  Sclerotic foci at T12 and L3 vertebral bodies again identified.  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  Interval increase in size of a cystic lesion at the LEFT iliopsoas of uncertain etiology ; in the setting of unexplained acute LEFT flank pain, consider followup MR imaging of this lesion to characterize.  Sigmoid diverticulosis without evidence of diverticulitis.  Extensive coronary arterial calcification.  Stable sclerotic foci of the T12 and L3 vertebral bodies, question old sclerotic metastases.   Electronically Signed   By: Lavonia Dana M.D.   On: 06/19/2015 16:13    EKG: Independently reviewed. Sinus rhythm with RBBB  Assessment/Plan Active Problems:   Breast cancer metastasized to bone   Type 2 diabetes mellitus with peripheral neuropathy   HTN (hypertension)   Hypothyroidism   Abdominal pain, acute   Lactic acidosis   CLL (chronic lymphocytic leukemia)   Abdominal pain   Left flank  pain   1. Left abdominal/flank pain. Etiology is unclear. She does not have any evidence of diverticulitis on CT imaging. She does have an enlarging left iliopsoas cystic lesion. Will need further imaging with MRI pelvis. If MRI pelvis is unremarkable, can consider MRI lumbar spine to evaluate for any other  underlying compression fractures. Will start empirically on zosyn until infectious etiology can be ruled out (unclear if this is abscess). Blood cultures/urine cultures will be sent. 2. Lactic acidosis. She does not appear to be septic or toxic. She is taking metformin for diabetes. Will hold metformin for now and start IV fluids. Repeat lactate in am. Infectious work up is being done. 3. CLL. Patient has chronic leukocytosis which appears to be at baseline. Continue to follow with oncology 4. Stage 4 breast cancer with bone mets. Patient has history of mets to sternum and ribs. On arimidex. Continue to follow with Dr. Benay Spice 5. HTN. Continue outpatient regimen. Likely exacerbated by pain 6. Diabetes. Hold oral agents including metformin and glimepiride for now. Start sliding scale insulin. Follow up with Dr. Buddy Duty. 7. Hypothyroidism. Continue synthroid.  Code Status: full code DVT Prophylaxis: lovenox Family Communication: discussed with daughter at the bedside Disposition Plan: pending hospital course  Time spent: 80mins  Chaim Gatley [facility] Pager [phone]

## 2015-06-19 NOTE — ED Notes (Signed)
Placed patient on bedpan and changed sheets.  Perineal area cleansed and placed dry pad under patient. Readjusted BP cuff. Updated RN.

## 2015-06-19 NOTE — ED Notes (Signed)
Pt reports to ED for eval of abd pain and left flank pain. She reports she has had a UTI x 1 month and has been on several different antibiotics without relief. She also reports nausea and diarrhea. Denies any active vomiting or blood in her stools. Pt reports chills but is unsure if she has had a fever. Pt A&Ox4, resp e/u, and skin warm and dry.

## 2015-06-19 NOTE — Progress Notes (Signed)
06/19/15 Call placed to Emergency room for report on patient, stated they will call me back.Dx of Abdominal Pain, Hx of Breast CA Metastasized to bone, DM 2, HTN, Hypothyroidism, Abdominal pain,Lactic acidosis, and Left flank pain,Diet Cardiac/ Carb Mod,VTE Lovenox,CBG's ACHS, VS regular,will be tele # 1,currently on room air. IV site 8/7 RFA with NS at  100 ml/hr.

## 2015-06-20 ENCOUNTER — Inpatient Hospital Stay (HOSPITAL_COMMUNITY): Payer: Medicare Other

## 2015-06-20 DIAGNOSIS — C50919 Malignant neoplasm of unspecified site of unspecified female breast: Secondary | ICD-10-CM

## 2015-06-20 DIAGNOSIS — E872 Acidosis: Secondary | ICD-10-CM

## 2015-06-20 DIAGNOSIS — E1140 Type 2 diabetes mellitus with diabetic neuropathy, unspecified: Secondary | ICD-10-CM

## 2015-06-20 DIAGNOSIS — E038 Other specified hypothyroidism: Secondary | ICD-10-CM

## 2015-06-20 DIAGNOSIS — C7951 Secondary malignant neoplasm of bone: Secondary | ICD-10-CM

## 2015-06-20 DIAGNOSIS — R109 Unspecified abdominal pain: Secondary | ICD-10-CM

## 2015-06-20 LAB — COMPREHENSIVE METABOLIC PANEL
ALT: 13 U/L — ABNORMAL LOW (ref 14–54)
ANION GAP: 10 (ref 5–15)
AST: 13 U/L — AB (ref 15–41)
Albumin: 2.7 g/dL — ABNORMAL LOW (ref 3.5–5.0)
Alkaline Phosphatase: 66 U/L (ref 38–126)
BUN: 10 mg/dL (ref 6–20)
CHLORIDE: 102 mmol/L (ref 101–111)
CO2: 22 mmol/L (ref 22–32)
CREATININE: 0.88 mg/dL (ref 0.44–1.00)
Calcium: 8.5 mg/dL — ABNORMAL LOW (ref 8.9–10.3)
GFR calc Af Amer: >60 mL/min (ref >60)
GFR calc non Af Amer: 57 mL/min — ABNORMAL LOW (ref >60)
GLUCOSE: 176 mg/dL — AB (ref 65–99)
Potassium: 3.7 mmol/L (ref 3.5–5.1)
SODIUM: 134 mmol/L — AB (ref 135–145)
Total Bilirubin: 0.8 mg/dL (ref 0.3–1.2)
Total Protein: 5.4 g/dL — ABNORMAL LOW (ref 6.5–8.1)

## 2015-06-20 LAB — GLUCOSE, CAPILLARY
GLUCOSE-CAPILLARY: 195 mg/dL — AB (ref 65–99)
Glucose-Capillary: 182 mg/dL — ABNORMAL HIGH (ref 65–99)
Glucose-Capillary: 251 mg/dL — ABNORMAL HIGH (ref 65–99)
Glucose-Capillary: 178 mg/dL — ABNORMAL HIGH (ref 65–99)

## 2015-06-20 LAB — CBC
HCT: 36.9 % (ref 36.0–46.0)
Hemoglobin: 12.3 g/dL (ref 12.0–15.0)
MCH: 29.4 pg (ref 26.0–34.0)
MCHC: 33.3 g/dL (ref 30.0–36.0)
MCV: 88.1 fL (ref 78.0–100.0)
PLATELETS: 282 10*3/uL (ref 150–400)
RBC: 4.19 MIL/uL (ref 3.87–5.11)
RDW: 14.2 % (ref 11.5–15.5)
WBC: 24.3 10*3/uL — AB (ref 4.0–10.5)

## 2015-06-20 LAB — LACTIC ACID, PLASMA: LACTIC ACID, VENOUS: 1.3 mmol/L (ref 0.5–2.0)

## 2015-06-20 MED ORDER — CETYLPYRIDINIUM CHLORIDE 0.05 % MT LIQD
7.0000 mL | Freq: Two times a day (BID) | OROMUCOSAL | Status: DC
  Administered 2015-06-20 (×2): 7 mL via OROMUCOSAL

## 2015-06-20 MED ORDER — DOXYCYCLINE HYCLATE 100 MG IV SOLR
100.0000 mg | Freq: Two times a day (BID) | INTRAVENOUS | Status: DC
  Administered 2015-06-20 – 2015-06-21 (×2): 100 mg via INTRAVENOUS
  Filled 2015-06-20 (×3): qty 100

## 2015-06-20 MED ORDER — DOXYCYCLINE HYCLATE 100 MG IV SOLR: 200.0000 mg | Freq: Two times a day (BID) | INTRAVENOUS | Status: DC

## 2015-06-20 MED ORDER — SODIUM CHLORIDE 0.9 % IV SOLN
INTRAVENOUS | Status: DC
  Administered 2015-06-20 – 2015-06-21 (×2): via INTRAVENOUS

## 2015-06-20 NOTE — Consult Note (Signed)
ORTHOPAEDIC CONSULTATION  REQUESTING PHYSICIAN: Thurnell Lose, MD  PCP:  Antony Blackbird, MD  Chief Complaint: evaluate L hip  HPI: Molly [name] is a 81 y.o. female who was admitted over the weekend for UTI with LBP / flank pain. S/P L hip hemiarthroplasty by Dr. Alvan Dame a couple years ago. Has metastatic breast with with multiple MSK mets and CLL. Patient states she was having severe LBP / flank pain that is now improved. Denies hip or groin pain. Has known iliopsoas / peritoneal inclusion cyst that has been imaged several times over the past few years without significant change. Has been ambulating in the room using a walker with PT.  Past Medical History  Diagnosis Date  . Hypertension   . MVP (mitral valve prolapse)   . Hypothyroidism   . Anemia   . Hypercholesterolemia   . Heart murmur   . Type II diabetes mellitus   . Arthritis     "back; knees, hands" (05/11/2015)  . Breast cancer, right breast     "cells went into the blood and caused her hip to break" (05/11/2015)   Past Surgical History  Procedure Laterality Date  . Hemiarthroplasty hip Right     "partial replacement"  . Pelvic and para-aortic lymph node dissection    . Tonsillectomy    . Appendectomy    . Cholecystectomy    . US echocardiography  03/30/2009    EF 55-60%  . Cardiovascular stress test  01/28/2008    EF 74%  . Esophagogastroduodenoscopy N/A 07/08/2014    Procedure: ESOPHAGOGASTRODUODENOSCOPY (EGD);  Surgeon: Winfield Cunas., MD;  Location: [facility];  Service: Endoscopy;  Laterality: N/A;  . Colonoscopy N/A 07/10/2014    Procedure: COLONOSCOPY;  Surgeon: Lear Ng, MD;  Location: [facility];  Service: Endoscopy;  Laterality: N/A;  . Total abdominal hysterectomy      w/BSO  . Dilation and curettage of uterus    . Breast biopsy Right 2009  . Breast lumpectomy Right 2009  . Fracture surgery    . Cataract extraction w/ intraocular lens  implant, bilateral Bilateral    History    Social History  . Marital Status: Widowed    Spouse Name: N/A  . Number of Children: N/A  . Years of Education: N/A   Social History Main Topics  . Smoking status: Never Smoker   . Smokeless tobacco: Never Used  . Alcohol Use: No  . Drug Use: No  . Sexual Activity: Not on file   Other Topics Concern  . None   Social History Narrative   Family History  Problem Relation Age of Onset  . Hypertension Mother   . Heart attack Father   . Hypertension Father   . Diabetes Sister   . Hypertension Sister   . Liver cancer Sister   . Heart attack Brother   . Hypertension Brother   . Diabetes Brother   . Diabetes Sister   . Hypertension Sister    Allergies  Allergen Reactions  . Amlodipine Swelling    edema  . Codeine Other (See Comments)    Makes her feel "crazy."  . Demerol Other (See Comments)    Makes her feel "crazy."  . Librium Other (See Comments)    Makes her feel "crazy."  . Lipitor [Atorvastatin Calcium] Other (See Comments)    Makes her feel "crazy."  . Metoprolol Other (See Comments)    Pt was told not to take this but was not told  why  . Contrast Media [Iodinated Diagnostic Agents] Other (See Comments)    Patient/family unsure if CT or MRI contrast. Also, unsure of reaction type.   Prior to Admission medications   Medication Sig Start Date End Date Taking? Authorizing Provider  acetaminophen (TYLENOL) 500 MG tablet Take 500 mg by mouth every 6 (six) hours as needed (pain).   Yes Historical Provider, MD  amLODipine-valsartan (EXFORGE) 10-320 MG per tablet Take 1 tablet by mouth daily. 03/22/15  Yes Darlin Coco, MD  anastrozole (ARIMIDEX) 1 MG tablet TAKE 1 TABLET BY MOUTH EVERY DAY 02/28/15  Yes Ladell Pier, MD  aspirin EC 81 MG tablet Take 81 mg by mouth daily.   Yes Historical Provider, MD  atenolol (TENORMIN) 50 MG tablet Take 1 tablet (50 mg total) by mouth 2 (two) times daily. 09/07/14  Yes Darlin Coco, MD  Calcium Carbonate-Vitamin D  (CALCIUM + D PO) Take 1 tablet by mouth daily after lunch.    Yes Historical Provider, MD  cholecalciferol (VITAMIN D) 1000 UNITS tablet Take 1,000 Units by mouth daily after lunch.    Yes Historical Provider, MD  Cyanocobalamin (B-12) 500 MCG TABS Take 500 mcg by mouth daily after lunch.    Yes Historical Provider, MD  ezetimibe (ZETIA) 10 MG tablet Take 1 tablet (10 mg total) by mouth daily. Patient taking differently: Take 10 mg by mouth daily at 6 PM.  09/07/14  Yes Darlin Coco, MD  FERROCITE 324 MG TABS TAKE 1 TABLET (106 MG OF IRON TOTAL) BY MOUTH DAILY. Patient taking differently: TAKE 1 TABLET (106 MG OF IRON TOTAL) BY MOUTH DAILY AFTER LUNCH 02/28/15  Yes Ladell Pier, MD  glimepiride (AMARYL) 4 MG tablet Take 4 mg by mouth daily with lunch.   Yes Historical Provider, MD  levothyroxine (SYNTHROID, LEVOTHROID) 75 MCG tablet Take 75 mcg by mouth daily before breakfast.    Yes Historical Provider, MD  metFORMIN (GLUCOPHAGE) 500 MG tablet Take 500 mg by mouth 2 (two) times daily with a meal.   Yes Historical Provider, MD  potassium chloride (KLOR-CON M10) 10 MEQ tablet TAKE 1 TABLET (10 MEQ TOTAL) BY MOUTH DAILY. Patient taking differently: Take 10 mEq by mouth daily after lunch.  09/07/14  Yes Darlin Coco, MD   Ct Abdomen Pelvis Wo Contrast  06/19/2015   CLINICAL DATA:  Acute abdominal pain, LEFT flank pain, UTI for a month, history breast cancer, appendectomy, cholecystectomy, hysterectomy, type II diabetes mellitus  EXAM: CT ABDOMEN AND PELVIS WITHOUT CONTRAST  TECHNIQUE: Multidetector CT imaging of the abdomen and pelvis was performed following the standard protocol without IV contrast. Patient refused oral and IV contrast for this exam. Sagittal and coronal MPR images reconstructed from axial data set.  COMPARISON:  10/19/2013  FINDINGS: Lung bases clear.  Extensive atherosclerotic calcifications including coronary arteries.  Liver, spleen, atrophic pancreas, kidneys, and adrenal  glands otherwise unremarkable.  Multiple curvilinear calcifications identified at the peripancreatic region, suspect calcified aneurysms measuring up to 18 x 15 mm in greatest size not significantly changed.  Normal size retroperitoneal nodes.  Cystic lesion at LEFT iliacus muscle measures 6.9 x 5.5 x 7.3 cm, increased.  Beam hardening artifacts from LEFT hip prosthesis obscure portions of pelvis.  Uterus surgically absent with nonvisualization of ovaries and appendix.  Sigmoid diverticulosis without gross evidence of diverticulitis.  Visualized portion urinary bladder unremarkable, portion obscured.  Stomach and remaining bowel loops otherwise grossly normal appearance.  No mass, adenopathy, free air or free fluid.  Multilevel degenerative disc and facet disease changes of the lumbar spine.  Lucent foci within the T12 and L1 vertebral bodies appear unchanged question vertebral hemangiomata.  Sclerotic foci at T12 and L3 vertebral bodies again identified.  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  Interval increase in size of a cystic lesion at the LEFT iliopsoas of uncertain etiology ; in the setting of unexplained acute LEFT flank pain, consider followup MR imaging of this lesion to characterize.  Sigmoid diverticulosis without evidence of diverticulitis.  Extensive coronary arterial calcification.  Stable sclerotic foci of the T12 and L3 vertebral bodies, question old sclerotic metastases.   Electronically Signed   By: Lavonia Dana M.D.   On: 06/19/2015 16:13   Mr Pelvis Wo Contrast  06/20/2015   CLINICAL DATA:  Followup left pelvic cyst.  EXAM: MRI PELVIS WITHOUT CONTRAST  TECHNIQUE: Multiplanar multisequence MR imaging of the pelvis was performed. No intravenous contrast was administered.  COMPARISON:  Multiple prior studies dating back to an MRI of the left hip in 2011.  FINDINGS: There is a benign-appearing cyst associated with the left iliacus  muscle. This measures 7.1 x 5.7 cm and measured 7.0 x 4.8 cm in 2011. This is most likely a benign peritoneal inclusion cyst or possibly ileo cell as bursitis. No surrounding inflammation. No pelvic adenopathy. Moderate artifact associated with the left hip prosthesis. There is advanced sigmoid diverticulosis without findings for acute diverticulitis. No inguinal mass or adenopathy.  IMPRESSION: No significant interval change in the left pelvic cystic lesions since 2011. This is most likely a benign extraperitoneal pelvic inclusion cyst or iliopsoas bursitis.  No other significant findings.   Electronically Signed   By: Marijo Sanes M.D.   On: 06/20/2015 08:16    Positive ROS: All other systems have been reviewed and were otherwise negative with the exception of those mentioned in the HPI and as above.  Physical Exam: General: Alert, no acute distress Cardiovascular: No pedal edema Respiratory: No cyanosis, no use of accessory musculature GI: No organomegaly, abdomen is soft and non-tender Skin: No lesions in the area of chief complaint Neurologic: Sensation intact distally Psychiatric: Patient is competent for consent with normal mood and affect Lymphatic: No axillary or cervical lymphadenopathy  MUSCULOSKELETAL: L hip incision healed. No erythema or TTP. Painless ROM hip. Painless resisted hip flexion.  Assessment: S/p L hip hemiarthroplasty by Dr. Alvan Dame L iliopsoas / peritoneal cyst, chronic and unchanged Metastatic breast CA CLL  Plan: Patient's hip is asymptomatic. Pain is improving; likely from UTI or known lumbar mets. Recommend analgesia and PT: WBAT with walker. F/u with Dr. Alvan Dame as an outpatient within 4 weeks.      Briyana Badman, Horald Pollen, MD Cell [phone]    06/20/2015 3:05 PM

## 2015-06-20 NOTE — Progress Notes (Signed)
Patient Demographics:    Molly [name], is a 81 y.o. female, DOB - May 17, 1923, MPN:[id number]  Admit date - 06/19/2015   Admitting Physician Kathie Dike, MD  Outpatient Primary MD for the patient is FULP, CAMMIE, MD  LOS - 1   Chief Complaint  Patient presents with  . Back Pain  . Abdominal Pain        Subjective:    Molly [name] today has, No headache, No chest pain, No abdominal pain - No Nausea, No new weakness tingling or numbness, No Cough - SOB. +ve L. lateral hip pain.   Assessment  & Plan :     1. Left-sided hip pain which appears musculoskeletal has been present for 2 weeks. Multiple treatments for presumed UTI, UA here looks clear. Has history of chronic ileo psoas bursitis on imaging. CT MRI done here conform ileo psoas bursitis. Discussed with orthopedic surgeon Dr. Lillia Corporal who will evaluate the patient, continues supportive care, PT eval and monitor. Do not think this is infectious will taper anti-biotics down to doxycycline for now.   2. CLL with chronic leukocytosis at baseline. Outpatient follow-up with PCP and oncologist as needed.   3. Lactic acidosis most likely due to Glucophage. Hold Glucophage and monitor. Does not appear toxic. I carb improving.   4. Stage IV breast cancer with bone metastases. Known metastases to sternum and ribs. Supportive care. Outpatient follow-up with her oncologist Dr. Malachy Mood.   5. Essential hypertension. Continue home regimen.   6. Hypothyroidism. Continue Synthroid at home dose.   7. DM type II. On ISS. Hold Glucophage.  Lab Results  Component Value Date   HGBA1C 9.0* 05/11/2015    CBG (last 3)   Recent Labs  06/19/15 2024 06/19/15 2249 06/20/15 0745  GLUCAP 191* 217* 182*     Code Status : Full  Family  Communication  : None Present  Disposition Plan  : TBD  Consults  :  Orthopedics Dr. Shawn Stall  Procedures  : CT scan abdomen and pelvis and MRI  pelvis. Both raising suspicion of possible ileal psoas bursitis which appears somewhat chronic.  DVT Prophylaxis  :  Lovenox    Lab Results  Component Value Date   PLT 282 06/20/2015    Inpatient Medications  Scheduled Meds: . amLODipine  10 mg Oral Daily   And  . irbesartan  300 mg Oral Daily  . anastrozole  1 mg Oral Daily  . antiseptic oral rinse  7 mL Mouth Rinse BID  . aspirin EC  81 mg Oral Daily  . atenolol  50 mg Oral BID  . doxycycline (VIBRAMYCIN) IV  100 mg Intravenous Q12H  . enoxaparin (LOVENOX) injection  40 mg Subcutaneous Q24H  . ezetimibe  10 mg Oral q1800  . insulin aspart  0-5 Units Subcutaneous QHS  . insulin aspart  0-9 Units Subcutaneous TID WC  . levothyroxine  75 mcg Oral QAC breakfast  . potassium chloride  10 mEq Oral QPC lunch  . sodium chloride  3 mL Intravenous Q12H   Continuous Infusions: . sodium chloride 75 mL/hr at 06/20/15 1104   PRN Meds:.acetaminophen **OR** acetaminophen, hydrALAZINE, HYDROmorphone (DILAUDID) injection, ondansetron **OR** ondansetron (ZOFRAN) IV  Antibiotics  :    Anti-infectives  Start     Dose/Rate Route Frequency Ordered Stop   06/20/15 1200  doxycycline (VIBRAMYCIN) 100 mg in dextrose 5 % 250 mL IVPB     100 mg 125 mL/hr over 120 Minutes Intravenous Every 12 hours 06/20/15 1052     06/20/15 1100  doxycycline (VIBRAMYCIN) 200 mg in dextrose 5 % 250 mL IVPB  Status:  Discontinued     200 mg 125 mL/hr over 120 Minutes Intravenous Every 12 hours 06/20/15 1048 06/20/15 1052   06/20/15 0400  piperacillin-tazobactam (ZOSYN) IVPB 3.375 g  Status:  Discontinued     3.375 g 12.5 mL/hr over 240 Minutes Intravenous Every 8 hours 06/19/15 1837 06/20/15 1047   06/19/15 1845  piperacillin-tazobactam (ZOSYN) IVPB 3.375 g     3.375 g 100 mL/hr over 30 Minutes Intravenous   Once 06/19/15 1837 06/20/15 0041        Objective:   Filed Vitals:   06/19/15 2026 06/19/15 2251 06/20/15 0526 06/20/15 1057  BP: 163/53 167/56 163/53 165/60  Pulse: 82 73 67 68  Temp: 97.8 F (36.6 C) 98.7 F (37.1 C) 98.6 F (37 C)   TempSrc: Oral Oral Oral   Resp: 18 18 18    Height:      Weight:      SpO2: 98% 97% 98%     Wt Readings from Last 3 Encounters:  06/19/15 90 kg (198 lb 6.6 oz)  06/04/15 86.183 kg (190 lb)  05/28/15 86.637 kg (191 lb)     Intake/Output Summary (Last 24 hours) at 06/20/15 1105 Last data filed at 06/20/15 0900  Gross per 24 hour  Intake    930 ml  Output    750 ml  Net    180 ml     Physical Exam  Awake Alert, Oriented X 3, No new F.N deficits, Normal affect [place].AT,PERRAL Supple Neck,No JVD, No cervical lymphadenopathy appriciated.  Symmetrical Chest wall movement, Good air movement bilaterally, CTAB RRR,No Gallops,Rubs or new Murmurs, No Parasternal Heave +ve B.Sounds, Abd Soft, No tenderness, No organomegaly appriciated, No rebound - guarding or rigidity. No Cyanosis, Clubbing or edema, No new Rash or bruise     Data Review:   Micro Results No results found for this or any previous visit (from the past 240 hour(s)).  Radiology Reports Ct Abdomen Pelvis Wo Contrast  06/19/2015   CLINICAL DATA:  Acute abdominal pain, LEFT flank pain, UTI for a month, history breast cancer, appendectomy, cholecystectomy, hysterectomy, type II diabetes mellitus  EXAM: CT ABDOMEN AND PELVIS WITHOUT CONTRAST  TECHNIQUE: Multidetector CT imaging of the abdomen and pelvis was performed following the standard protocol without IV contrast. Patient refused oral and IV contrast for this exam. Sagittal and coronal MPR images reconstructed from axial data set.  COMPARISON:  10/19/2013  FINDINGS: Lung bases clear.  Extensive atherosclerotic calcifications including coronary arteries.  Liver, spleen, atrophic pancreas, kidneys, and adrenal glands otherwise  unremarkable.  Multiple curvilinear calcifications identified at the peripancreatic region, suspect calcified aneurysms measuring up to 18 x 15 mm in greatest size not significantly changed.  Normal size retroperitoneal nodes.  Cystic lesion at LEFT iliacus muscle measures 6.9 x 5.5 x 7.3 cm, increased.  Beam hardening artifacts from LEFT hip prosthesis obscure portions of pelvis.  Uterus surgically absent with nonvisualization of ovaries and appendix.  Sigmoid diverticulosis without gross evidence of diverticulitis.  Visualized portion urinary bladder unremarkable, portion obscured.  Stomach and remaining bowel loops otherwise grossly normal appearance.  No mass, adenopathy, free air or  free fluid.  Multilevel degenerative disc and facet disease changes of the lumbar spine.  Lucent foci within the T12 and L1 vertebral bodies appear unchanged question vertebral hemangiomata.  Sclerotic foci at T12 and L3 vertebral bodies again identified.  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  Interval increase in size of a cystic lesion at the LEFT iliopsoas of uncertain etiology ; in the setting of unexplained acute LEFT flank pain, consider followup MR imaging of this lesion to characterize.  Sigmoid diverticulosis without evidence of diverticulitis.  Extensive coronary arterial calcification.  Stable sclerotic foci of the T12 and L3 vertebral bodies, question old sclerotic metastases.   Electronically Signed   By: Lavonia Dana M.D.   On: 06/19/2015 16:13   Dg Chest 2 View  05/28/2015   CLINICAL DATA:  Pt complains of shortness of breath since yesterday; pt also complains of generalized weakness today; she also states she's had diarrhea for several weeks; non-smoker; pt states she has h/o HTN and diabetes  EXAM: CHEST  2 VIEW  COMPARISON:  05/11/2015  FINDINGS: Cardiac silhouette borderline enlarged. No mediastinal or hilar masses or evidence of adenopathy.   Subtle nodule right upper lobe is unchanged from the recent prior exam. No lung consolidation or edema. No pleural effusion or pneumothorax.  Bony thorax is demineralized. Old healed rib fracture on the left, stable.  IMPRESSION: 1. No acute cardiopulmonary disease. 2. Subtle nodule again suggested in the right upper lobe. Recommend followup chest radiograph in 3 months to reassess versus follow-up unenhanced chest CT.   Electronically Signed   By: Lajean Manes M.D.   On: 05/28/2015 10:01   Mr Pelvis Wo Contrast  06/20/2015   CLINICAL DATA:  Followup left pelvic cyst.  EXAM: MRI PELVIS WITHOUT CONTRAST  TECHNIQUE: Multiplanar multisequence MR imaging of the pelvis was performed. No intravenous contrast was administered.  COMPARISON:  Multiple prior studies dating back to an MRI of the left hip in 2011.  FINDINGS: There is a benign-appearing cyst associated with the left iliacus muscle. This measures 7.1 x 5.7 cm and measured 7.0 x 4.8 cm in 2011. This is most likely a benign peritoneal inclusion cyst or possibly ileo cell as bursitis. No surrounding inflammation. No pelvic adenopathy. Moderate artifact associated with the left hip prosthesis. There is advanced sigmoid diverticulosis without findings for acute diverticulitis. No inguinal mass or adenopathy.  IMPRESSION: No significant interval change in the left pelvic cystic lesions since 2011. This is most likely a benign extraperitoneal pelvic inclusion cyst or iliopsoas bursitis.  No other significant findings.   Electronically Signed   By: Marijo Sanes M.D.   On: 06/20/2015 08:16     CBC  Recent Labs Lab 06/19/15 1241 06/20/15 0534  WBC 22.1* 24.3*  HGB 14.8 12.3  HCT 44.1 36.9  PLT 267 282  MCV 87.5 88.1  MCH 29.4 29.4  MCHC 33.6 33.3  RDW 14.0 14.2  LYMPHSABS 9.9*  --   MONOABS 0.7  --   EOSABS 0.2  --   BASOSABS 0.0  --     Chemistries   Recent Labs Lab 06/19/15 1241 06/20/15 0534  NA 134* 134*  K 3.9 3.7  CL 101 102  CO2  21* 22  GLUCOSE 217* 176*  BUN 10 10  CREATININE 0.76 0.88  CALCIUM 9.3 8.5*  AST 26 13*  ALT 16 13*  ALKPHOS 78 66  BILITOT 0.7 0.8   ------------------------------------------------------------------------------------------------------------------ estimated creatinine clearance is 46.1  mL/min (by C-G formula based on Cr of 0.88). ------------------------------------------------------------------------------------------------------------------ No results for input(s): HGBA1C in the last 72 hours. ------------------------------------------------------------------------------------------------------------------ No results for input(s): CHOL, HDL, LDLCALC, TRIG, CHOLHDL, LDLDIRECT in the last 72 hours. ------------------------------------------------------------------------------------------------------------------ No results for input(s): TSH, T4TOTAL, T3FREE, THYROIDAB in the last 72 hours.  Invalid input(s): FREET3 ------------------------------------------------------------------------------------------------------------------ No results for input(s): VITAMINB12, FOLATE, FERRITIN, TIBC, IRON, RETICCTPCT in the last 72 hours.  Coagulation profile No results for input(s): INR, PROTIME in the last 168 hours.  No results for input(s): DDIMER in the last 72 hours.  Cardiac Enzymes No results for input(s): CKMB, TROPONINI, MYOGLOBIN in the last 168 hours.  Invalid input(s): CK ------------------------------------------------------------------------------------------------------------------ Invalid input(s): POCBNP   Time Spent in minutes   35   Godric Lavell K M.D on 06/20/2015 at 11:05 AM  Between 7am to 7pm - Pager - [phone]  After 7pm go to [email or web address] - password [facility]  [facility] -  Office  [phone]

## 2015-06-20 NOTE — Evaluation (Signed)
Physical Therapy Evaluation Patient Details Name: Molly [name] MRN: [id number] DOB: May 03, 1923 Today's Date: 06/20/2015   History of Present Illness  81 y.o. female who has a history of recurrent urinary tract infections, presented to the ED with complaints of left flank and left abdominal pain.   Clinical Impression  Patient demonstrates deficits in functional mobility as indicated below. Would benefit from  continued skilled PT to address deficits and maximize function, however, patient declines further therapy services. Patient will have necessary support in place upon discharge. At this time will sign off, if patient reconsiders, please re-consult. Thank you.    Follow Up Recommendations Other (comment) (pt could benefit from continued PT but declines at this time)    Equipment Recommendations  None recommended by PT    Recommendations for Other Services       Precautions / Restrictions Precautions Precautions: Fall Restrictions Weight Bearing Restrictions: No      Mobility  Bed Mobility               General bed mobility comments: recevied in chair  Transfers Overall transfer level: Needs assistance Equipment used: Rolling walker (2 wheeled) Transfers: Sit to/from Stand Sit to Stand: Supervision         General transfer comment: VCs for safe hand placement  Ambulation/Gait Ambulation/Gait assistance: Supervision Ambulation Distance (Feet): 40 Feet Assistive device: Rolling walker (2 wheeled) Gait Pattern/deviations: Step-through pattern;Decreased stride length;Shuffle;Trunk flexed Gait velocity: decreased Gait velocity interpretation: Below normal speed for age/gender General Gait Details: steady with in room ambulation despite overall gait deficits  Stairs            Wheelchair Mobility    Modified Rankin (Stroke Patients Only)       Balance Overall balance assessment:  (some instability noted, not formally assessed)                                            Pertinent Vitals/Pain Pain Assessment: No/denies pain    Home Living Family/patient expects to be discharged to:: Private residence Living Arrangements: Children Available Help at Discharge: Family;Available PRN/intermittently Type of Home: House Home Access: Stairs to enter Entrance Stairs-Rails: Psychiatric nurse of Steps: 1 Home Layout: One level Home Equipment: Walker - 2 wheels;Bedside commode;Walker - 4 wheels      Prior Function Level of Independence: Independent with assistive device(s)         Comments: walker for ambulation     Hand Dominance   Dominant Hand: Right    Extremity/Trunk Assessment   Upper Extremity Assessment: Overall WFL for tasks assessed           Lower Extremity Assessment: Generalized weakness         Communication   Communication: No difficulties  Cognition Arousal/Alertness: Awake/alert Behavior During Therapy: Flat affect Overall Cognitive Status: Within Functional Limits for tasks assessed                      General Comments      Exercises        Assessment/Plan    PT Assessment Patent does not need any further PT services  PT Diagnosis Abnormality of gait;Generalized weakness   PT Problem List    PT Treatment Interventions     PT Goals (Current goals can be found in the Care Plan section) Acute Rehab PT Goals Patient Stated  Goal: to go home PT Goal Formulation: All assessment and education complete, DC therapy    Frequency     Barriers to discharge        Co-evaluation               End of Session Equipment Utilized During Treatment: Gait belt Activity Tolerance: No increased pain Patient left: in chair;with call bell/phone within reach;with family/visitor present Nurse Communication: Mobility status         Time: 3009-2330 PT Time Calculation (min) (ACUTE ONLY): 21 min   Charges:   PT Evaluation $Initial PT  Evaluation Tier I: 1 Procedure     PT G CodesDuncan Dull 07-Jul-2015, 4:17 PM Alben Deeds, [place] DPT  [phone]

## 2015-06-20 NOTE — Care Management Note (Addendum)
Case Management Note  Patient Details  Name: Molly [name] MRN: [id number] Date of Birth: 01-13-1923  Subjective/Objective:  81 y.o. F admitted with flank pain, initially thought to be related to L hip (s/p Hemiarthroplasty)  (know Lumbar Mets) related to CLL with Chronic Leukocytosis. Diagnosis seems to be UTI with ABX treatment. PT eval suggesting HHPT but  Pt declines services. Pt is from private residence where she lives with children.   Action/Plan: Will follow along for final disposition. Anticipate no Discharge Needs.   Expected Discharge Date:                  Expected Discharge Plan:  Home/Self Care  In-House Referral:     Discharge planning Services  CM Consult  Post Acute Care Choice:    Choice offered to:     DME Arranged:    DME Agency:     HH Arranged:    HH Agency:     Status of Service:  In process, will continue to follow  Medicare Important Message Given:    Date Medicare IM Given:    Medicare IM give by:    Date Additional Medicare IM Given:    Additional Medicare Important Message give by:     If discussed at [place] of Stay Meetings, dates discussed:    Additional Comments:  Delrae Sawyers, RN 06/20/2015, 5:09 PM

## 2015-06-21 LAB — CBC
HCT: 37.4 % (ref 36.0–46.0)
HEMOGLOBIN: 12.2 g/dL (ref 12.0–15.0)
MCH: 28.6 pg (ref 26.0–34.0)
MCHC: 32.6 g/dL (ref 30.0–36.0)
MCV: 87.8 fL (ref 78.0–100.0)
Platelets: 295 10*3/uL (ref 150–400)
RBC: 4.26 MIL/uL (ref 3.87–5.11)
RDW: 14.4 % (ref 11.5–15.5)
WBC: 20.5 10*3/uL — AB (ref 4.0–10.5)

## 2015-06-21 LAB — GLUCOSE, CAPILLARY: Glucose-Capillary: 200 mg/dL — ABNORMAL HIGH (ref 65–99)

## 2015-06-21 LAB — BASIC METABOLIC PANEL
ANION GAP: 10 (ref 5–15)
BUN: 9 mg/dL (ref 6–20)
CALCIUM: 8.5 mg/dL — AB (ref 8.9–10.3)
CHLORIDE: 101 mmol/L (ref 101–111)
CO2: 22 mmol/L (ref 22–32)
CREATININE: 0.67 mg/dL (ref 0.44–1.00)
GFR calc Af Amer: >60 mL/min (ref >60)
GFR calc non Af Amer: >60 mL/min (ref >60)
Glucose, Bld: 219 mg/dL — ABNORMAL HIGH (ref 65–99)
Potassium: 3.5 mmol/L (ref 3.5–5.1)
SODIUM: 133 mmol/L — AB (ref 135–145)

## 2015-06-21 LAB — PATHOLOGIST SMEAR REVIEW

## 2015-06-21 MED ORDER — POLYETHYLENE GLYCOL 3350 17 G PO PACK
17.0000 g | PACK | Freq: Two times a day (BID) | ORAL | Status: DC
  Administered 2015-06-21: 17 g via ORAL
  Filled 2015-06-21: qty 1

## 2015-06-21 MED ORDER — DOCUSATE SODIUM 100 MG PO CAPS
200.0000 mg | ORAL_CAPSULE | Freq: Two times a day (BID) | ORAL | Status: DC
  Administered 2015-06-21: 200 mg via ORAL
  Filled 2015-06-21: qty 2

## 2015-06-21 MED ORDER — BISACODYL 10 MG RE SUPP
10.0000 mg | Freq: Every day | RECTAL | Status: DC
  Filled 2015-06-21: qty 1

## 2015-06-21 MED ORDER — DOCUSATE SODIUM 100 MG PO CAPS: 200.0000 mg | ORAL_CAPSULE | Freq: Two times a day (BID) | ORAL | Status: DC | PRN

## 2015-06-21 MED ORDER — HYDROCODONE-ACETAMINOPHEN 5-325 MG PO TABS: 1.0000 | ORAL_TABLET | Freq: Four times a day (QID) | ORAL | Status: DC | PRN

## 2015-06-21 MED ORDER — POLYETHYLENE GLYCOL 3350 17 G PO PACK: 17.0000 g | PACK | Freq: Every day | ORAL | Status: DC

## 2015-06-21 NOTE — Discharge Summary (Signed)
Molly [name], is a 81 y.o. female  DOB [birth year]  MRN [id number].  Admission date:  06/19/2015  Admitting Physician  No admitting provider for patient encounter.  Discharge Date:  06/21/2015   Primary MD  Antony Blackbird, MD  Recommendations for primary care physician for things to follow:   Check CBC, CMP next visit.   Outpatient follow-up with orthopedics and oncology on a close basis.   Admission Diagnosis  Abnormal CT of the abdomen [R93.5] Abdominal pain, acute [R10.0]   Discharge Diagnosis  Abnormal CT of the abdomen [R93.5] Abdominal pain, acute [R10.0]    Active Problems:   Breast cancer metastasized to bone   Type 2 diabetes mellitus with peripheral neuropathy   HTN (hypertension)   Hypothyroidism   Abdominal pain, acute   Lactic acidosis   CLL (chronic lymphocytic leukemia)   Abdominal pain   Left flank pain      Past Medical History  Diagnosis Date  . Hypertension   . MVP (mitral valve prolapse)   . Hypothyroidism   . Anemia   . Hypercholesterolemia   . Heart murmur   . Type II diabetes mellitus   . Arthritis     "back; knees, hands" (05/11/2015)  . Breast cancer, right breast     "cells went into the blood and caused her hip to break" (05/11/2015)    Past Surgical History  Procedure Laterality Date  . Hemiarthroplasty hip Right     "partial replacement"  . Pelvic and para-aortic lymph node dissection    . Tonsillectomy    . Appendectomy    . Cholecystectomy    . US echocardiography  03/30/2009    EF 55-60%  . Cardiovascular stress test  01/28/2008    EF 74%  . Esophagogastroduodenoscopy N/A 07/08/2014    Procedure: ESOPHAGOGASTRODUODENOSCOPY (EGD);  Surgeon: Winfield Cunas., MD;  Location: [facility];  Service: Endoscopy;  Laterality: N/A;  . Colonoscopy N/A 07/10/2014      Procedure: COLONOSCOPY;  Surgeon: Lear Ng, MD;  Location: [facility];  Service: Endoscopy;  Laterality: N/A;  . Total abdominal hysterectomy      w/BSO  . Dilation and curettage of uterus    . Breast biopsy Right 2009  . Breast lumpectomy Right 2009  . Fracture surgery    . Cataract extraction w/ intraocular lens  implant, bilateral Bilateral        HPI  from the history and physical done on the day of admission:    Molly [name] is a 81 y.o. female who has a history of recurrent urinary tract infections, presents to the ED today with complaints of left flank and left abdominal pain. She reports that she has intermittently had this pain over the last few weeks, gets better when she is on antibiotics and once she is off antibiotics, her discomfort returns. She has had 2 positive urine cultures in the last few weeks with E coli and pseudomonas. She is unsure whether she has had a true fever, but  reports that she does have hot flashes. She denies any shortness of breath, cough, vomiting. She has chronic loose stools which her daughter feels are at her baseline. She has had several ER visits for similar complaints in the last few weeks. She was evaluated in the ED where imaging of her abdomen indicated a left enlarging iliopsoas cyst. She has been referred for further work up.     Hospital Course:     1. Left-sided hip pain which appears musculoskeletal has been present for 2 weeks. Multiple treatments for presumed UTI, UA here looks clear. Has history of chronic ileo psoas bursitis on imaging. CT MRI done here again suggested possible ileo psoas bursitis. Discussed with orthopedic surgeon Dr. Lillia Corporal who followed the patient and thought pain was coming from her L spine metastases versus UTI, of note no UTI this admission, patient was afebrile and no infection was suggested clinically.   She was seen by PT and she ambulated well without any assistance and was cleared for  home discharge which will be done today. Will provide her with some pain medications. Most likely musculoskeletal pain either from ligament strain or radiation from L spine metastases. She has no focal deficits. Ambulates well without assistance. Will follow with PCP, orthopedics and oncology postdischarge.   2. CLL with chronic leukocytosis at baseline. Outpatient follow-up with PCP and oncologist.   3. Lactic acidosis most likely due to Glucophage. Hold Glucophage and monitor. Does not appear toxic. I carb improving.   4. Stage IV breast cancer with bone metastases also to the spine. Known metastases to sternum spine and ribs. Supportive care. Outpatient follow-up with her oncologist Dr. Blair Promise. Discussed the case with her oncologist today, stable for outpatient follow-up from malignancy standpoint.   5. Essential hypertension. Continue home regimen.   6. Hypothyroidism. Continue Synthroid at home dose.   7. DM type II. And to do home regimen except Glucophage due to lactic acidosis. Request PCP to monitor glycemic control and A1c.  Lab Results  Component Value Date   HGBA1C 9.0* 05/11/2015          Discharge Condition: Stable  Follow UP  Follow-up Information    Follow up with FULP, CAMMIE, MD. Schedule an appointment as soon as possible for a visit in 1 week.   Specialty:  Family Medicine   Contact information:   [address] [place] [address] [phone]       Follow up with Betsy Coder, MD. Schedule an appointment as soon as possible for a visit in 1 week.   Specialty:  Oncology   Contact information:   [place] [address] [phone]       Follow up with Mauri Pole, MD. Schedule an appointment as soon as possible for a visit in 1 week.   Specialty:  Orthopedic Surgery   Why:  Hip pain   Contact information:   [address] [place] [address] [phone]        Consults obtained -  Orthopedics  Diet and Activity recommendation: See Discharge Instructions below  Discharge Instructions       Discharge Instructions    Discharge instructions    Complete by:  As directed   Follow with Primary MD FULP, CAMMIE, MD in 7 days   Get CBC, CMP, 2 view Chest X ray checked  by Primary MD next visit.    Activity: As tolerated with Full fall precautions use walker/cane & assistance as needed  Disposition Home     Diet: Heart Healthy Low carb.  For Heart failure patients - Check your Weight same time everyday, if you gain over 2 pounds, or you develop in leg swelling, experience more shortness of breath or chest pain, call your Primary MD immediately. Follow Cardiac Low Salt Diet and 1.5 lit/day fluid restriction.   On your next visit with your primary care physician please Get Medicines reviewed and adjusted.   Please request your Prim.MD to go over all Hospital Tests and Procedure/Radiological results at the follow up, please get all Hospital records sent to your Prim MD by signing hospital release before you go home.   If you experience worsening of your admission symptoms, develop shortness of breath, life threatening emergency, suicidal or homicidal thoughts you must seek medical attention immediately by calling 911 or calling your MD immediately  if symptoms less severe.  You Must read complete instructions/literature along with all the possible adverse reactions/side effects for all the Medicines you take and that have been prescribed to you. Take any new Medicines after you have completely understood and accpet all the possible adverse reactions/side effects.   Do not drive, operating heavy machinery, perform activities at heights, swimming or participation in water activities or provide baby sitting services if your were admitted for syncope or siezures until you have seen by Primary MD or a Neurologist and advised to do so again.  Do not drive when taking Pain  medications.    Do not take more than prescribed Pain, Sleep and Anxiety Medications  Special Instructions: If you have smoked or chewed Tobacco  in the last 2 yrs please stop smoking, stop any regular Alcohol  and or any Recreational drug use.  Wear Seat belts while driving.   Please note  You were cared for by a hospitalist during your hospital stay. If you have any questions about your discharge medications or the care you received while you were in the hospital after you are discharged, you can call the unit and asked to speak with the hospitalist on call if the hospitalist that took care of you is not available. Once you are discharged, your primary care physician will handle any further medical issues. Please note that NO REFILLS for any discharge medications will be authorized once you are discharged, as it is imperative that you return to your primary care physician (or establish a relationship with a primary care physician if you do not have one) for your aftercare needs so that they can reassess your need for medications and monitor your lab values.     Increase activity slowly    Complete by:  As directed              Discharge Medications       Medication List    STOP taking these medications        metFORMIN 500 MG tablet  Commonly known as:  GLUCOPHAGE      TAKE these medications        acetaminophen 500 MG tablet  Commonly known as:  TYLENOL  Take 500 mg by mouth every 6 (six) hours as needed (pain).     amLODipine-valsartan 10-320 MG per tablet  Commonly known as:  EXFORGE  Take 1 tablet by mouth daily.     anastrozole 1 MG tablet  Commonly known as:  ARIMIDEX  TAKE 1 TABLET BY MOUTH EVERY DAY     aspirin EC 81 MG tablet  Take 81 mg by  mouth daily.     atenolol 50 MG tablet  Commonly known as:  TENORMIN  Take 1 tablet (50 mg total) by mouth 2 (two) times daily.     B-12 500 MCG Tabs  Take 500 mcg by mouth daily after lunch.     CALCIUM + D PO   Take 1 tablet by mouth daily after lunch.     cholecalciferol 1000 UNITS tablet  Commonly known as:  VITAMIN D  Take 1,000 Units by mouth daily after lunch.     docusate sodium 100 MG capsule  Commonly known as:  COLACE  Take 2 capsules (200 mg total) by mouth 2 (two) times daily as needed for mild constipation.     ezetimibe 10 MG tablet  Commonly known as:  ZETIA  Take 1 tablet (10 mg total) by mouth daily.     FERROCITE 324 (106 FE) MG Tabs  Generic drug:  Ferrous Fumarate  TAKE 1 TABLET (106 MG OF IRON TOTAL) BY MOUTH DAILY.     glimepiride 4 MG tablet  Commonly known as:  AMARYL  Take 4 mg by mouth daily with lunch.     HYDROcodone-acetaminophen 5-325 MG per tablet  Commonly known as:  NORCO/VICODIN  Take 1 tablet by mouth every 6 (six) hours as needed for moderate pain.     levothyroxine 75 MCG tablet  Commonly known as:  SYNTHROID, LEVOTHROID  Take 75 mcg by mouth daily before breakfast.     polyethylene glycol packet  Commonly known as:  MIRALAX / GLYCOLAX  Take 17 g by mouth daily.     potassium chloride 10 MEQ tablet  Commonly known as:  KLOR-CON M10  TAKE 1 TABLET (10 MEQ TOTAL) BY MOUTH DAILY.        Major procedures and Radiology Reports - PLEASE review detailed and final reports for all details, in brief -       Ct Abdomen Pelvis Wo Contrast  06/19/2015   CLINICAL DATA:  Acute abdominal pain, LEFT flank pain, UTI for a month, history breast cancer, appendectomy, cholecystectomy, hysterectomy, type II diabetes mellitus  EXAM: CT ABDOMEN AND PELVIS WITHOUT CONTRAST  TECHNIQUE: Multidetector CT imaging of the abdomen and pelvis was performed following the standard protocol without IV contrast. Patient refused oral and IV contrast for this exam. Sagittal and coronal MPR images reconstructed from axial data set.  COMPARISON:  10/19/2013  FINDINGS: Lung bases clear.  Extensive atherosclerotic calcifications including coronary arteries.  Liver, spleen, atrophic  pancreas, kidneys, and adrenal glands otherwise unremarkable.  Multiple curvilinear calcifications identified at the peripancreatic region, suspect calcified aneurysms measuring up to 18 x 15 mm in greatest size not significantly changed.  Normal size retroperitoneal nodes.  Cystic lesion at LEFT iliacus muscle measures 6.9 x 5.5 x 7.3 cm, increased.  Beam hardening artifacts from LEFT hip prosthesis obscure portions of pelvis.  Uterus surgically absent with nonvisualization of ovaries and appendix.  Sigmoid diverticulosis without gross evidence of diverticulitis.  Visualized portion urinary bladder unremarkable, portion obscured.  Stomach and remaining bowel loops otherwise grossly normal appearance.  No mass, adenopathy, free air or free fluid.  Multilevel degenerative disc and facet disease changes of the lumbar spine.  Lucent foci within the T12 and L1 vertebral bodies appear unchanged question vertebral hemangiomata.  Sclerotic foci at T12 and L3 vertebral bodies again identified.  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  Interval increase in size of a cystic lesion at the LEFT  iliopsoas of uncertain etiology ; in the setting of unexplained acute LEFT flank pain, consider followup MR imaging of this lesion to characterize.  Sigmoid diverticulosis without evidence of diverticulitis.  Extensive coronary arterial calcification.  Stable sclerotic foci of the T12 and L3 vertebral bodies, question old sclerotic metastases.   Electronically Signed   By: Lavonia Dana M.D.   On: 06/19/2015 16:13   Dg Chest 2 View  05/28/2015   CLINICAL DATA:  Pt complains of shortness of breath since yesterday; pt also complains of generalized weakness today; she also states she's had diarrhea for several weeks; non-smoker; pt states she has h/o HTN and diabetes  EXAM: CHEST  2 VIEW  COMPARISON:  05/11/2015  FINDINGS: Cardiac silhouette borderline enlarged. No mediastinal  or hilar masses or evidence of adenopathy.  Subtle nodule right upper lobe is unchanged from the recent prior exam. No lung consolidation or edema. No pleural effusion or pneumothorax.  Bony thorax is demineralized. Old healed rib fracture on the left, stable.  IMPRESSION: 1. No acute cardiopulmonary disease. 2. Subtle nodule again suggested in the right upper lobe. Recommend followup chest radiograph in 3 months to reassess versus follow-up unenhanced chest CT.   Electronically Signed   By: Lajean Manes M.D.   On: 05/28/2015 10:01   Mr Pelvis Wo Contrast  06/20/2015   CLINICAL DATA:  Followup left pelvic cyst.  EXAM: MRI PELVIS WITHOUT CONTRAST  TECHNIQUE: Multiplanar multisequence MR imaging of the pelvis was performed. No intravenous contrast was administered.  COMPARISON:  Multiple prior studies dating back to an MRI of the left hip in 2011.  FINDINGS: There is a benign-appearing cyst associated with the left iliacus muscle. This measures 7.1 x 5.7 cm and measured 7.0 x 4.8 cm in 2011. This is most likely a benign peritoneal inclusion cyst or possibly ileo cell as bursitis. No surrounding inflammation. No pelvic adenopathy. Moderate artifact associated with the left hip prosthesis. There is advanced sigmoid diverticulosis without findings for acute diverticulitis. No inguinal mass or adenopathy.  IMPRESSION: No significant interval change in the left pelvic cystic lesions since 2011. This is most likely a benign extraperitoneal pelvic inclusion cyst or iliopsoas bursitis.  No other significant findings.   Electronically Signed   By: Marijo Sanes M.D.   On: 06/20/2015 08:16    Micro Results      Recent Results (from the past 240 hour(s))  Culture, blood (routine x 2)     Status: None (Preliminary result)   Collection Time: 06/19/15 12:28 PM  Result Value Ref Range Status   Specimen Description BLOOD BLOOD RIGHT FOREARM  Final   Special Requests BOTTLES DRAWN AEROBIC AND ANAEROBIC 4CC  Final    Culture NO GROWTH 1 DAY  Final   Report Status PENDING  Incomplete  Culture, blood (routine x 2)     Status: None (Preliminary result)   Collection Time: 06/19/15 12:42 PM  Result Value Ref Range Status   Specimen Description BLOOD LEFT HAND  Final   Special Requests BOTTLES DRAWN AEROBIC AND ANAEROBIC 3CC  Final   Culture NO GROWTH 1 DAY  Final   Report Status PENDING  Incomplete       Today   Subjective    Molly [name] today has no headache,no chest abdominal pain,no new weakness tingling or numbness, feels much better wants to go home today.     Objective   Blood pressure 158/54, pulse 71, temperature 98.3 F (36.8 C), temperature source Oral, resp. rate  20, height 5\' 3"  (1.6 m), weight 90 kg (198 lb 6.6 oz), SpO2 95 %.   Intake/Output Summary (Last 24 hours) at 06/21/15 0846 Last data filed at 06/21/15 0811  Gross per 24 hour  Intake   2715 ml  Output   1900 ml  Net    815 ml    Exam Awake Alert, Oriented x 3, No new F.N deficits, Normal affect [place].AT,PERRAL Supple Neck,No JVD, No cervical lymphadenopathy appriciated.  Symmetrical Chest wall movement, Good air movement bilaterally, CTAB RRR,No Gallops,Rubs or new Murmurs, No Parasternal Heave +ve B.Sounds, Abd Soft, Non tender, No organomegaly appriciated, No rebound -guarding or rigidity. No Cyanosis, Clubbing or edema, No new Rash or bruise   Data Review   CBC w Diff: Lab Results  Component Value Date   WBC 20.5* 06/21/2015   WBC 24.8* 02/07/2015   HGB 12.2 06/21/2015   HGB 12.5 02/07/2015   HCT 37.4 06/21/2015   HCT 38.0 02/07/2015   PLT 295 06/21/2015   PLT 336 02/07/2015   LYMPHOPCT 45 06/19/2015   LYMPHOPCT 53.1* 02/07/2015   MONOPCT 3 06/19/2015   MONOPCT 4.0 02/07/2015   EOSPCT 1 06/19/2015   EOSPCT 1.1 02/07/2015   BASOPCT 0 06/19/2015   BASOPCT 0.2 02/07/2015    CMP: Lab Results  Component Value Date   NA 134* 06/20/2015   NA 136 09/22/2014   K 3.7 06/20/2015   K 3.8 09/22/2014    CL 102 06/20/2015   CO2 22 06/20/2015   CO2 21* 09/22/2014   BUN 10 06/20/2015   BUN 11.4 09/22/2014   CREATININE 0.88 06/20/2015   CREATININE 0.9 09/22/2014   PROT 5.4* 06/20/2015   PROT 6.7 09/22/2014   ALBUMIN 2.7* 06/20/2015   ALBUMIN 3.4* 09/22/2014   BILITOT 0.8 06/20/2015   BILITOT 0.23 09/22/2014   ALKPHOS 66 06/20/2015   ALKPHOS 90 09/22/2014   AST 13* 06/20/2015   AST 14 09/22/2014   ALT 13* 06/20/2015   ALT 17 09/22/2014  .   Total Time in preparing paper work, data evaluation and todays exam - 35 minutes  Thurnell Lose M.D on 06/21/2015 at 8:46 AM  [facility]   Office  [phone]

## 2015-06-21 NOTE — Discharge Instructions (Signed)
Follow with Primary MD FULP, CAMMIE, MD in 7 days   Get CBC, CMP, 2 view Chest X ray checked  by Primary MD next visit.    Activity: As tolerated with Full fall precautions use walker/cane & assistance as needed   Disposition Home     Diet: Heart Healthy Low carb.  For Heart failure patients - Check your Weight same time everyday, if you gain over 2 pounds, or you develop in leg swelling, experience more shortness of breath or chest pain, call your Primary MD immediately. Follow Cardiac Low Salt Diet and 1.5 lit/day fluid restriction.   On your next visit with your primary care physician please Get Medicines reviewed and adjusted.   Please request your Prim.MD to go over all Hospital Tests and Procedure/Radiological results at the follow up, please get all Hospital records sent to your Prim MD by signing hospital release before you go home.   If you experience worsening of your admission symptoms, develop shortness of breath, life threatening emergency, suicidal or homicidal thoughts you must seek medical attention immediately by calling 911 or calling your MD immediately  if symptoms less severe.  You Must read complete instructions/literature along with all the possible adverse reactions/side effects for all the Medicines you take and that have been prescribed to you. Take any new Medicines after you have completely understood and accpet all the possible adverse reactions/side effects.   Do not drive, operating heavy machinery, perform activities at heights, swimming or participation in water activities or provide baby sitting services if your were admitted for syncope or siezures until you have seen by Primary MD or a Neurologist and advised to do so again.  Do not drive when taking Pain medications.    Do not take more than prescribed Pain, Sleep and Anxiety Medications  Special Instructions: If you have smoked or chewed Tobacco  in the last 2 yrs please stop smoking, stop any  regular Alcohol  and or any Recreational drug use.  Wear Seat belts while driving.   Please note  You were cared for by a hospitalist during your hospital stay. If you have any questions about your discharge medications or the care you received while you were in the hospital after you are discharged, you can call the unit and asked to speak with the hospitalist on call if the hospitalist that took care of you is not available. Once you are discharged, your primary care physician will handle any further medical issues. Please note that NO REFILLS for any discharge medications will be authorized once you are discharged, as it is imperative that you return to your primary care physician (or establish a relationship with a primary care physician if you do not have one) for your aftercare needs so that they can reassess your need for medications and monitor your lab values.

## 2015-06-21 NOTE — Care Management Note (Signed)
Case Management Note  Patient Details  Name: LINH HEDBERG MRN: [id number] Date of Birth: Oct 26, 1923  Subjective/Objective:   Patient for dc to home today, declines any HH services.  No needs.                 Action/Plan:   Expected Discharge Date:                  Expected Discharge Plan:  Home/Self Care  In-House Referral:     Discharge planning Services  CM Consult  Post Acute Care Choice:    Choice offered to:     DME Arranged:    DME Agency:     HH Arranged:    [place] Agency:     Status of Service:  Completed, signed off  Medicare Important Message Given:    Date Medicare IM Given:    Medicare IM give by:    Date Additional Medicare IM Given:    Additional Medicare Important Message give by:     If discussed at [place] of Stay Meetings, dates discussed:    Additional Comments:  Zenon Mayo, RN 06/21/2015, 11:14 AM

## 2015-06-21 NOTE — Progress Notes (Signed)
Molly [name] to be D/[name]'d Home per MD order.  Discussed with the patient and all questions fully answered.  VSS.  IV catheter discontinued intact. Site without signs and symptoms of complications. Dressing and pressure applied.  An After Visit Summary was printed and given to the patient. Patient received prescription.  D/[name] education completed with patient/family including follow up instructions, medication list, d/[name] activities limitations if indicated, with other d/[name] instructions as indicated by MD - patient able to verbalize understanding, all questions fully answered.   Patient instructed to return to ED, call 911, or call MD for any changes in condition.   Patient escorted via [place], and D/[name] home via private auto.  [place], Molly [name] 06/21/2015 11:35 AM

## 2015-06-22 LAB — URINE CULTURE: Culture: 50000

## 2015-06-24 LAB — CULTURE, BLOOD (ROUTINE X 2)
Culture: NO GROWTH
Culture: NO GROWTH

## 2015-06-29 ENCOUNTER — Telehealth: Payer: Self-pay | Admitting: *Deleted

## 2015-06-29 NOTE — Telephone Encounter (Signed)
Agree with advice given

## 2015-06-29 NOTE — Telephone Encounter (Signed)
Pt's daughter called during time [facility] was down.  She reports pt's blood pressure has been elevated recently. Daughter reports pt has been in and out of hospital recently with a UTI. Today her blood pressure is 196/83 and heart rate 76.  Yesterday she checked several times during the day and it ran 180/76-205/84.  On Monday blood pressure at home was 180/70 and heart rate 80. She was seen in primary care that day and blood pressure was 148/50 in their office.  Daughter also reports blood sugar was up at this visit and medication increased.  Blood sugars are still elevated and daughter is calling primary care this AM to discuss.  I asked her to discuss blood pressure with primary care when she calls them today.  I told her if primary care felt Dr. Mare Ferrari needed to address to call us back.  Daughter reports other than elevated BP pt is feeling fine.

## 2015-07-05 ENCOUNTER — Telehealth: Payer: Self-pay | Admitting: Cardiology

## 2015-07-05 NOTE — Telephone Encounter (Signed)
Spoke with patient and she is taking Nitrofurantoin 100 mg twice a day for UTI, has four more days of treatment  When I asked the patient if she was still having pain or if felt like UTI clearing up stated she was still having pain Does have follow up for UA with PCP  Patient states her systolic blood pressure has been running in the 190's and is concerned, reviewed medications  Will forward to  Dr. Mare Ferrari for review

## 2015-07-05 NOTE — Telephone Encounter (Signed)
Pt calling to talk to Rip Harbour re medication-pls call [phone]

## 2015-07-06 MED ORDER — ATENOLOL 50 MG PO TABS: 50.0000 mg | ORAL_TABLET | Freq: Three times a day (TID) | ORAL | Status: DC

## 2015-07-06 NOTE — Telephone Encounter (Signed)
Increase atenolol to TID for now. The UTI may have caused the BP to shoot higher.

## 2015-07-06 NOTE — Telephone Encounter (Signed)
Advised patient, verbalized understanding  

## 2015-07-06 NOTE — Telephone Encounter (Signed)
Continue to monitor

## 2015-07-13 ENCOUNTER — Telehealth: Payer: Self-pay | Admitting: Cardiology

## 2015-07-13 MED ORDER — HYDRALAZINE HCL 25 MG PO TABS: 25.0000 mg | ORAL_TABLET | Freq: Three times a day (TID) | ORAL | Status: DC | PRN

## 2015-07-13 NOTE — Telephone Encounter (Signed)
Also add hydralazine 25 mg TID prn for systolic BP greater than 979

## 2015-07-13 NOTE — Telephone Encounter (Signed)
Spoke with pt and informed her of new order for Hydralazine 25mg  TID PRN for SBP greater than 175. Verified pharmacy. Informed pt to continue monitoring BP and let our office know if no improvement. Pt verbalized understanding and was in agreement with this plan.

## 2015-07-13 NOTE — Telephone Encounter (Signed)
Spoke with pt and she states that she has been having problems with her BP being high for a little over a week now. Pt states that she did take her medications this morning at 9AM and she checked her BP just a few minutes before calling and it was 205/84. Pt denies headache, chest pain, dizziness or SOB. Pt states that she does have blurry vision but that this is normal for her and she states that it is unchanged from her baseline. Informed pt that I would speak with our [employer] provider for the day and she said that she wants message sent to Dr. Mare Ferrari for him to address. Pt adamant that message be sent to Dr. Mare Ferrari. Pt states that she is due to take another Atenolol at lunch and will check BP again. Will forward to Dr. Mare Ferrari for review and advisement.

## 2015-07-13 NOTE — Telephone Encounter (Signed)
New message     Pt c/o BP issue: STAT if pt c/o blurred vision, one-sided weakness or slurred speech  1. What are your last 5 BP readings? Yesterday 193/74 180/72 186/71 191/75; 205/84 today  2. Are you having any other symptoms (ex. Dizziness, headache, blurred vision, passed out)? Blurred vision   3. What is your BP issue? Bp running high

## 2015-07-14 ENCOUNTER — Telehealth: Payer: Self-pay | Admitting: Cardiology

## 2015-07-14 DIAGNOSIS — Z8719 Personal history of other diseases of the digestive system: Secondary | ICD-10-CM

## 2015-07-14 HISTORY — DX: Personal history of other diseases of the digestive system: Z87.19

## 2015-07-14 NOTE — Telephone Encounter (Signed)
Spoke with patient and she stated that her pharmacist said she could not take Hydralazine with insulin  Verified with Milas Hock D that it is ok to take together Have tried to call patient multiple times and the line remains busy, will continue calling

## 2015-07-14 NOTE — Telephone Encounter (Signed)
New message      Pt said she is on insulin and she has not been taking the hydralazine.  Please call

## 2015-07-14 NOTE — Telephone Encounter (Signed)
Advised patient, verbalized understanding  

## 2015-07-22 ENCOUNTER — Emergency Department (HOSPITAL_COMMUNITY): Payer: Medicare Other

## 2015-07-22 ENCOUNTER — Emergency Department (HOSPITAL_COMMUNITY)
Admission: EM | Admit: 2015-07-22 | Discharge: 2015-07-23 | Disposition: A | Discharge status: Home / Self Care | Payer: Medicare Other | Attending: Emergency Medicine | Admitting: Emergency Medicine

## 2015-07-22 ENCOUNTER — Encounter (HOSPITAL_COMMUNITY): Payer: Self-pay | Admitting: Emergency Medicine

## 2015-07-22 DIAGNOSIS — R109 Unspecified abdominal pain: Secondary | ICD-10-CM

## 2015-07-22 DIAGNOSIS — Z794 Long term (current) use of insulin: Secondary | ICD-10-CM | POA: Diagnosis not present

## 2015-07-22 DIAGNOSIS — E039 Hypothyroidism, unspecified: Secondary | ICD-10-CM | POA: Diagnosis not present

## 2015-07-22 DIAGNOSIS — K5641 Fecal impaction: Secondary | ICD-10-CM | POA: Insufficient documentation

## 2015-07-22 DIAGNOSIS — E780 Pure hypercholesterolemia: Secondary | ICD-10-CM | POA: Diagnosis not present

## 2015-07-22 DIAGNOSIS — M1990 Unspecified osteoarthritis, unspecified site: Secondary | ICD-10-CM | POA: Insufficient documentation

## 2015-07-22 DIAGNOSIS — R55 Syncope and collapse: Secondary | ICD-10-CM | POA: Insufficient documentation

## 2015-07-22 DIAGNOSIS — Z7982 Long term (current) use of aspirin: Secondary | ICD-10-CM | POA: Insufficient documentation

## 2015-07-22 DIAGNOSIS — Z853 Personal history of malignant neoplasm of breast: Secondary | ICD-10-CM | POA: Insufficient documentation

## 2015-07-22 DIAGNOSIS — I10 Essential (primary) hypertension: Secondary | ICD-10-CM | POA: Diagnosis not present

## 2015-07-22 DIAGNOSIS — E119 Type 2 diabetes mellitus without complications: Secondary | ICD-10-CM | POA: Insufficient documentation

## 2015-07-22 DIAGNOSIS — Z79899 Other long term (current) drug therapy: Secondary | ICD-10-CM | POA: Insufficient documentation

## 2015-07-22 DIAGNOSIS — R011 Cardiac murmur, unspecified: Secondary | ICD-10-CM | POA: Insufficient documentation

## 2015-07-22 DIAGNOSIS — Z862 Personal history of diseases of the blood and blood-forming organs and certain disorders involving the immune mechanism: Secondary | ICD-10-CM | POA: Insufficient documentation

## 2015-07-22 LAB — COMPREHENSIVE METABOLIC PANEL
ALBUMIN: 3.3 g/dL — AB (ref 3.5–5.0)
ALT: 17 U/L (ref 14–54)
ANION GAP: 10 (ref 5–15)
AST: 21 U/L (ref 15–41)
Alkaline Phosphatase: 83 U/L (ref 38–126)
BUN: 9 mg/dL (ref 6–20)
CHLORIDE: 102 mmol/L (ref 101–111)
CO2: 24 mmol/L (ref 22–32)
Calcium: 9.3 mg/dL (ref 8.9–10.3)
Creatinine, Ser: 0.76 mg/dL (ref 0.44–1.00)
GFR calc Af Amer: >60 mL/min (ref >60)
GFR calc non Af Amer: >60 mL/min (ref >60)
GLUCOSE: 222 mg/dL — AB (ref 65–99)
POTASSIUM: 3.3 mmol/L — AB (ref 3.5–5.1)
SODIUM: 136 mmol/L (ref 135–145)
Total Bilirubin: 0.6 mg/dL (ref 0.3–1.2)
Total Protein: 6.5 g/dL (ref 6.5–8.1)

## 2015-07-22 LAB — CBC
HCT: 43.6 % (ref 36.0–46.0)
HEMOGLOBIN: 14.5 g/dL (ref 12.0–15.0)
MCH: 29.7 pg (ref 26.0–34.0)
MCHC: 33.3 g/dL (ref 30.0–36.0)
MCV: 89.3 fL (ref 78.0–100.0)
Platelets: 330 10*3/uL (ref 150–400)
RBC: 4.88 MIL/uL (ref 3.87–5.11)
RDW: 13.8 % (ref 11.5–15.5)
WBC: 25 10*3/uL — ABNORMAL HIGH (ref 4.0–10.5)

## 2015-07-22 LAB — URINALYSIS, ROUTINE W REFLEX MICROSCOPIC
Bilirubin Urine: NEGATIVE
GLUCOSE, UA: 100 mg/dL — AB
HGB URINE DIPSTICK: NEGATIVE
Ketones, ur: NEGATIVE mg/dL
Nitrite: NEGATIVE
Protein, ur: 30 mg/dL — AB
Specific Gravity, Urine: 1.01 (ref 1.005–1.030)
Urobilinogen, UA: 0.2 mg/dL (ref 0.0–1.0)
pH: 7 (ref 5.0–8.0)

## 2015-07-22 LAB — URINE MICROSCOPIC-ADD ON

## 2015-07-22 LAB — TROPONIN I: Troponin I: <0.03 ng/mL (ref <0.031)

## 2015-07-22 LAB — LIPASE, BLOOD: Lipase: 25 U/L (ref 22–51)

## 2015-07-22 MED ORDER — ONDANSETRON HCL 4 MG/2ML IJ SOLN
4.0000 mg | Freq: Once | INTRAMUSCULAR | Status: AC
  Administered 2015-07-22: 4 mg via INTRAVENOUS
  Filled 2015-07-22: qty 2

## 2015-07-22 MED ORDER — HYDRALAZINE HCL 20 MG/ML IJ SOLN
10.0000 mg | INTRAMUSCULAR | Status: AC
  Administered 2015-07-22: 10 mg via INTRAVENOUS
  Filled 2015-07-22: qty 1

## 2015-07-22 MED ORDER — BARIUM SULFATE 2.1 % PO SUSP
ORAL | Status: AC
  Filled 2015-07-22: qty 2

## 2015-07-22 MED ORDER — SODIUM CHLORIDE 0.9 % IV BOLUS (SEPSIS)
500.0000 mL | Freq: Once | INTRAVENOUS | Status: AC
  Administered 2015-07-22: 500 mL via INTRAVENOUS

## 2015-07-22 NOTE — ED Notes (Signed)
Family at bedside. 

## 2015-07-22 NOTE — ED Notes (Signed)
Patient transported to CT 

## 2015-07-22 NOTE — ED Notes (Signed)
Pt from home for eval of reoccurring UTI and increased weakness. Pt states she has not been able to walk around like normal and has had multiple episodes of near syncope. nad noted.

## 2015-07-22 NOTE — ED Notes (Signed)
Pt dry heaving in triage, HR noted to be 38 on monitor.

## 2015-07-22 NOTE — ED Notes (Signed)
Pt transported to xray 

## 2015-07-22 NOTE — ED Provider Notes (Signed)
CSN: [id number]     Arrival date & time 07/22/15  1312 History   First MD Initiated Contact with Patient 07/22/15 1416     Chief Complaint  Patient presents with  . Near Syncope  . Abdominal Pain     (Consider location/radiation/quality/duration/timing/severity/associated sxs/prior Treatment) HPI Comments: The pt is an 81 year old - recently treated for UTI - had a follow up and was not having improvement - second test showed ongoing UTI - refilled macrobid, still having vaginal and SP pain - no blood in the urine.  She has generalized weakness.  She has n/v, RUQ pain.  Dull CP earlier today which resolved spontaneously - no other SOB or dyspnea.  Trouble with ambulation b/c she gets dizzy and nauseated with movement.  She is a diabetic on insulin.  Patient is a 81 y.o. female presenting with near-syncope and abdominal pain. The history is provided by the patient.  Near Syncope Associated symptoms include abdominal pain.  Abdominal Pain   Past Medical History  Diagnosis Date  . Hypertension   . MVP (mitral valve prolapse)   . Hypothyroidism   . Anemia   . Hypercholesterolemia   . Heart murmur   . Type II diabetes mellitus   . Arthritis     "back; knees, hands" (05/11/2015)  . Breast cancer, right breast     "cells went into the blood and caused her hip to break" (05/11/2015)   Past Surgical History  Procedure Laterality Date  . Hemiarthroplasty hip Right     "partial replacement"  . Pelvic and para-aortic lymph node dissection    . Tonsillectomy    . Appendectomy    . Cholecystectomy    . US echocardiography  03/30/2009    EF 55-60%  . Cardiovascular stress test  01/28/2008    EF 74%  . Esophagogastroduodenoscopy N/A 07/08/2014    Procedure: ESOPHAGOGASTRODUODENOSCOPY (EGD);  Surgeon: Winfield Cunas., MD;  Location: [facility];  Service: Endoscopy;  Laterality: N/A;  . Colonoscopy N/A 07/10/2014    Procedure: COLONOSCOPY;  Surgeon: Lear Ng, MD;  Location: [facility]  ENDOSCOPY;  Service: Endoscopy;  Laterality: N/A;  . Total abdominal hysterectomy      w/BSO  . Dilation and curettage of uterus    . Breast biopsy Right 2009  . Breast lumpectomy Right 2009  . Fracture surgery    . Cataract extraction w/ intraocular lens  implant, bilateral Bilateral    Family History  Problem Relation Age of Onset  . Hypertension Mother   . Heart attack Father   . Hypertension Father   . Diabetes Sister   . Hypertension Sister   . Liver cancer Sister   . Heart attack Brother   . Hypertension Brother   . Diabetes Brother   . Diabetes Sister   . Hypertension Sister    Social History  Substance Use Topics  . Smoking status: Never Smoker   . Smokeless tobacco: Never Used  . Alcohol Use: No   OB History    No data available     Review of Systems  Cardiovascular: Positive for near-syncope.  Gastrointestinal: Positive for abdominal pain.      Allergies  Amlodipine; Codeine; Demerol; Librium; Lipitor; Metoprolol; and Contrast media  Home Medications   Prior to Admission medications   Medication Sig Start Date End Date Taking? Authorizing Provider  acetaminophen (TYLENOL) 500 MG tablet Take 500 mg by mouth every 6 (six) hours as needed (pain).    Historical Provider,  MD  amLODipine-valsartan (EXFORGE) 10-320 MG per tablet Take 1 tablet by mouth daily. 03/22/15   Darlin Coco, MD  anastrozole (ARIMIDEX) 1 MG tablet TAKE 1 TABLET BY MOUTH EVERY DAY 02/28/15   Ladell Pier, MD  aspirin EC 81 MG tablet Take 81 mg by mouth daily.    Historical Provider, MD  atenolol (TENORMIN) 50 MG tablet Take 1 tablet (50 mg total) by mouth 3 (three) times daily. 07/06/15   Darlin Coco, MD  Calcium Carbonate-Vitamin D (CALCIUM + D PO) Take 1 tablet by mouth daily after lunch.     Historical Provider, MD  cholecalciferol (VITAMIN D) 1000 UNITS tablet Take 1,000 Units by mouth daily after lunch.     Historical Provider, MD  Cyanocobalamin (B-12) 500 MCG TABS Take  500 mcg by mouth daily after lunch.     Historical Provider, MD  docusate sodium (COLACE) 100 MG capsule Take 2 capsules (200 mg total) by mouth 2 (two) times daily as needed for mild constipation. 06/21/15   Thurnell Lose, MD  ezetimibe (ZETIA) 10 MG tablet Take 1 tablet (10 mg total) by mouth daily. Patient taking differently: Take 10 mg by mouth daily at 6 PM.  09/07/14   Darlin Coco, MD  FERROCITE 324 MG TABS TAKE 1 TABLET (106 MG OF IRON TOTAL) BY MOUTH DAILY. Patient taking differently: TAKE 1 TABLET (106 MG OF IRON TOTAL) BY MOUTH DAILY AFTER LUNCH 02/28/15   Ladell Pier, MD  glimepiride (AMARYL) 4 MG tablet Take 4 mg by mouth daily with lunch.    Historical Provider, MD  hydrALAZINE (APRESOLINE) 25 MG tablet Take 1 tablet (25 mg total) by mouth 3 (three) times daily as needed (for systolic blood pressure greater than 175). 07/13/15   Darlin Coco, MD  HYDROcodone-acetaminophen (NORCO/VICODIN) 5-325 MG per tablet Take 1 tablet by mouth every 6 (six) hours as needed for moderate pain. 06/21/15   Thurnell Lose, MD  insulin glargine (LANTUS) 100 UNIT/ML injection Inject 14 Units into the skin daily.    Historical Provider, MD  levothyroxine (SYNTHROID, LEVOTHROID) 75 MCG tablet Take 75 mcg by mouth daily before breakfast.     Historical Provider, MD  polyethylene glycol (MIRALAX / GLYCOLAX) packet Take 17 g by mouth daily. 06/21/15   Thurnell Lose, MD  potassium chloride (KLOR-CON M10) 10 MEQ tablet TAKE 1 TABLET (10 MEQ TOTAL) BY MOUTH DAILY. Patient taking differently: Take 10 mEq by mouth daily after lunch.  09/07/14   Darlin Coco, MD   BP 176/63 mmHg  Pulse 59  Temp(Src) 97.9 F (36.6 C) (Oral)  Resp 18  SpO2 97% Physical Exam  Constitutional: She appears well-developed and well-nourished. No distress.  HENT:  Head: Normocephalic and atraumatic.  Mouth/Throat: No oropharyngeal exudate.  MM dry  Eyes: Conjunctivae and EOM are normal. Pupils are equal, round, and  reactive to light. Right eye exhibits no discharge. Left eye exhibits no discharge. No scleral icterus.  Neck: Normal range of motion. Neck supple. No JVD present. No thyromegaly present.  Cardiovascular: Regular rhythm and intact distal pulses.  Exam reveals no gallop and no friction rub.   Murmur ( soft systolic) heard. bradycardia  Pulmonary/Chest: Effort normal and breath sounds normal. No respiratory distress. She has no wheezes. She has no rales.  Abdominal: Soft. Bowel sounds are normal. She exhibits no distension and no mass. There is tenderness ( mild epigastric and SP ttp, no distention, no guarding, no peritoneal signs and no tympanitic sounds to  percussion).  Musculoskeletal: Normal range of motion. She exhibits no edema or tenderness.  Lymphadenopathy:    She has no cervical adenopathy.  Neurological: She is alert. Coordination normal.  Neurologic exam:  Speech clear, pupils equal round reactive to light, extraocular movements intact  Normal peripheral visual fields Cranial nerves III through XII normal including no facial droop Follows commands, moves all extremities x4, normal strength to bilateral upper and lower extremities at all major muscle groups including grip Sensation normal to light touch and pinprick Coordination intact, no limb ataxia, finger-nose-finger normal Rapid alternating movements normal No pronator drift   Skin: Skin is warm and dry. No rash noted. No erythema.  Psychiatric: She has a normal mood and affect. Her behavior is normal.  Nursing note and vitals reviewed.   ED Course  Procedures (including critical care time) Labs Review Labs Reviewed  COMPREHENSIVE METABOLIC PANEL - Abnormal; Notable for the following:    Potassium 3.3 (*)    Glucose, Bld 222 (*)    Albumin 3.3 (*)    All other components within normal limits  CBC - Abnormal; Notable for the following:    WBC 25.0 (*)    All other components within normal limits  LIPASE, BLOOD   URINALYSIS, ROUTINE W REFLEX MICROSCOPIC (NOT AT [facility])  CBG MONITORING, ED    Imaging Review No results found. I have personally reviewed and evaluated these images and lab results as part of my medical decision-making.   EKG Interpretation   Date/Time:  Friday July 22 2015 13:36:20 EDT Ventricular Rate:  46 PR Interval:  166 QRS Duration: 144 QT Interval:  528 QTC Calculation: 462 R Axis:   10 Text Interpretation:  Sinus bradycardia Right bundle branch block Minimal  voltage criteria for LVH, may be normal variant Abnormal ECG since last  tracing no significant change Confirmed by Arif Amendola  MD, Makhari Dovidio ([id number]) on  07/22/2015 2:33:33 PM      MDM   Final diagnoses:  None    There are no acute findings on exam to suggest a definite etiology. Would consider recurrent urinary infection, severe hypertension, dehydration, renal failure, hyperglycemia, coronary syndrome. Labs ordered, EKG unchanged and shows a right bundle branch block with a mild bradycardia, IV fluid bolus, antiemetics, antihypertensives.  Change of care - signed over to Dr. Ayesha Rumpf  Meds given in ED:  Medications  ondansetron [facility]) injection 4 mg (not administered)  sodium chloride 0.9 % bolus 500 mL (not administered)  hydrALAZINE (APRESOLINE) injection 10 mg (not administered)    New Prescriptions   No medications on file        Noemi Chapel, MD 07/22/15 1630

## 2015-07-22 NOTE — Discharge Instructions (Signed)
Abdominal Pain °Many things can cause abdominal pain. Usually, abdominal pain is not caused by a disease and will improve without treatment. It can often be observed and treated at home. Your health care provider will do a physical exam and possibly order blood tests and X-rays to help determine the seriousness of your pain. However, in many cases, more time must pass before a clear cause of the pain can be found. Before that point, your health care provider may not know if you need more testing or further treatment. °HOME CARE INSTRUCTIONS  °Monitor your abdominal pain for any changes. The following actions may help to alleviate any discomfort you are experiencing: °· Only take over-the-counter or prescription medicines as directed by your health care provider. °· Do not take laxatives unless directed to do so by your health care provider. °· Try a clear liquid diet (broth, tea, or water) as directed by your health care provider. Slowly move to a bland diet as tolerated. °SEEK MEDICAL CARE IF: °· You have unexplained abdominal pain. °· You have abdominal pain associated with nausea or diarrhea. °· You have pain when you urinate or have a bowel movement. °· You experience abdominal pain that wakes you in the night. °· You have abdominal pain that is worsened or improved by eating food. °· You have abdominal pain that is worsened with eating fatty foods. °· You have a fever. °SEEK IMMEDIATE MEDICAL CARE IF:  °· Your pain does not go away within 2 hours. °· You keep throwing up (vomiting). °· Your pain is felt only in portions of the abdomen, such as the right side or the left lower portion of the abdomen. °· You pass bloody or black tarry stools. °MAKE SURE YOU: °· Understand these instructions.   °· Will watch your condition.   °· Will get help right away if you are not doing well or get worse.   °Document Released: 08/08/2005 Document Revised: 11/03/2013 Document Reviewed: 07/08/2013 °[employer]® Patient Information  ©2015 [employer]. This information is not intended to replace advice given to you by your health care provider. Make sure you discuss any questions you have with your health care provider. ° °Constipation °Constipation is when a person has fewer than three bowel movements a week, has difficulty having a bowel movement, or has stools that are dry, hard, or larger than normal. As people grow older, constipation is more common. If you try to fix constipation with medicines that make you have a bowel movement (laxatives), the problem may get worse. Long-term laxative use may cause the muscles of the colon to become weak. A low-fiber diet, not taking in enough fluids, and taking certain medicines may make constipation worse.  °CAUSES  °· Certain medicines, such as antidepressants, pain medicine, iron supplements, antacids, and water pills.   °· Certain diseases, such as diabetes, irritable bowel syndrome (IBS), thyroid disease, or depression.   °· Not drinking enough water.   °· Not eating enough fiber-rich foods.   °· Stress or travel.   °· Lack of physical activity or exercise.   °· Ignoring the urge to have a bowel movement.   °· Using laxatives too much.   °SIGNS AND SYMPTOMS  °· Having fewer than three bowel movements a week.   °· Straining to have a bowel movement.   °· Having stools that are hard, dry, or larger than normal.   °· Feeling full or bloated.   °· Pain in the lower abdomen.   °· Not feeling relief after having a bowel movement.   °DIAGNOSIS  °Your health care provider will take   a medical history and perform a physical exam. Further testing may be done for severe constipation. Some tests may include: °· A barium enema X-ray to examine your rectum, colon, and, sometimes, your small intestine.   °· A sigmoidoscopy to examine your lower colon.   °· A colonoscopy to examine your entire colon. °TREATMENT  °Treatment will depend on the severity of your constipation and what is causing it. Some dietary  treatments include drinking more fluids and eating more fiber-rich foods. Lifestyle treatments may include regular exercise. If these diet and lifestyle recommendations do not help, your health care provider may recommend taking over-the-counter laxative medicines to help you have bowel movements. Prescription medicines may be prescribed if over-the-counter medicines do not work.  °HOME CARE INSTRUCTIONS  °· Eat foods that have a lot of fiber, such as fruits, vegetables, whole grains, and beans. °· Limit foods high in fat and processed sugars, such as french fries, hamburgers, cookies, candies, and soda.   °· A fiber supplement may be added to your diet if you cannot get enough fiber from foods.   °· Drink enough fluids to keep your urine clear or pale yellow.   °· Exercise regularly or as directed by your health care provider.   °· Go to the restroom when you have the urge to go. Do not hold it.   °· Only take over-the-counter or prescription medicines as directed by your health care provider. Do not take other medicines for constipation without talking to your health care provider first.   °SEEK IMMEDIATE MEDICAL CARE IF:  °· You have bright red blood in your stool.   °· Your constipation lasts for more than 4 days or gets worse.   °· You have abdominal or rectal pain.   °· You have thin, pencil-like stools.   °· You have unexplained weight loss. °MAKE SURE YOU:  °· Understand these instructions. °· Will watch your condition. °· Will get help right away if you are not doing well or get worse. °Document Released: 07/27/2004 Document Revised: 11/03/2013 Document Reviewed: 08/10/2013 °[employer]® Patient Information ©2015 [employer]. This information is not intended to replace advice given to you by your health care provider. Make sure you discuss any questions you have with your health care provider. ° °

## 2015-07-22 NOTE — ED Notes (Signed)
Advised MD patient was complaining of pain in the rectum and feeling like "there is something down there and it won't come out."  MD advised patient was impacted.  I advised the patient MD aware she is having discomfort.

## 2015-07-25 ENCOUNTER — Telehealth: Payer: Self-pay | Admitting: Cardiology

## 2015-07-25 NOTE — Telephone Encounter (Signed)
New message    Pt c/o medication issue:  1. Name of Medication: hydralazine  2. How are you currently taking this medication (dosage and times per day)? 25mg ; 3 times a day  3. Are you having a reaction (difficulty breathing--STAT)? No  4. What is your medication issue? Causing low b/p

## 2015-07-25 NOTE — Telephone Encounter (Signed)
Spoke with patient and daughter Both stated that she was give a shot of Hydralazine 10 mg in the hospital and her blood pressure dropped to 103/40  Patient states she will not take that and wants chart marked allergic to it Will put as intolerance in chart as requested

## 2015-07-29 ENCOUNTER — Inpatient Hospital Stay (HOSPITAL_COMMUNITY): Payer: Medicare Other

## 2015-07-29 ENCOUNTER — Encounter (HOSPITAL_COMMUNITY): Payer: Self-pay | Admitting: Cardiology

## 2015-07-29 ENCOUNTER — Inpatient Hospital Stay (HOSPITAL_COMMUNITY)
Admission: EM | Admit: 2015-07-29 | Discharge: 2015-08-02 | DRG: 062 | Disposition: A | Discharge status: Home Health | Payer: Medicare Other | Attending: Neurology | Admitting: Neurology

## 2015-07-29 ENCOUNTER — Emergency Department (HOSPITAL_COMMUNITY): Payer: Medicare Other

## 2015-07-29 DIAGNOSIS — Z9842 Cataract extraction status, left eye: Secondary | ICD-10-CM | POA: Diagnosis not present

## 2015-07-29 DIAGNOSIS — E785 Hyperlipidemia, unspecified: Secondary | ICD-10-CM | POA: Diagnosis present

## 2015-07-29 DIAGNOSIS — I639 Cerebral infarction, unspecified: Secondary | ICD-10-CM

## 2015-07-29 DIAGNOSIS — I10 Essential (primary) hypertension: Secondary | ICD-10-CM | POA: Diagnosis present

## 2015-07-29 DIAGNOSIS — R29810 Facial weakness: Secondary | ICD-10-CM | POA: Diagnosis present

## 2015-07-29 DIAGNOSIS — Z888 Allergy status to other drugs, medicaments and biological substances status: Secondary | ICD-10-CM

## 2015-07-29 DIAGNOSIS — I119 Hypertensive heart disease without heart failure: Secondary | ICD-10-CM | POA: Diagnosis not present

## 2015-07-29 DIAGNOSIS — Z794 Long term (current) use of insulin: Secondary | ICD-10-CM | POA: Diagnosis not present

## 2015-07-29 DIAGNOSIS — Z961 Presence of intraocular lens: Secondary | ICD-10-CM | POA: Diagnosis present

## 2015-07-29 DIAGNOSIS — R531 Weakness: Secondary | ICD-10-CM

## 2015-07-29 DIAGNOSIS — D72829 Elevated white blood cell count, unspecified: Secondary | ICD-10-CM

## 2015-07-29 DIAGNOSIS — Z885 Allergy status to narcotic agent status: Secondary | ICD-10-CM | POA: Diagnosis not present

## 2015-07-29 DIAGNOSIS — I638 Other cerebral infarction: Secondary | ICD-10-CM | POA: Diagnosis not present

## 2015-07-29 DIAGNOSIS — C50911 Malignant neoplasm of unspecified site of right female breast: Secondary | ICD-10-CM | POA: Diagnosis present

## 2015-07-29 DIAGNOSIS — E119 Type 2 diabetes mellitus without complications: Secondary | ICD-10-CM | POA: Diagnosis present

## 2015-07-29 DIAGNOSIS — G8191 Hemiplegia, unspecified affecting right dominant side: Secondary | ICD-10-CM | POA: Diagnosis present

## 2015-07-29 DIAGNOSIS — E1142 Type 2 diabetes mellitus with diabetic polyneuropathy: Secondary | ICD-10-CM

## 2015-07-29 DIAGNOSIS — I6789 Other cerebrovascular disease: Secondary | ICD-10-CM | POA: Diagnosis not present

## 2015-07-29 DIAGNOSIS — R471 Dysarthria and anarthria: Secondary | ICD-10-CM | POA: Diagnosis present

## 2015-07-29 DIAGNOSIS — E1159 Type 2 diabetes mellitus with other circulatory complications: Secondary | ICD-10-CM | POA: Diagnosis not present

## 2015-07-29 DIAGNOSIS — E1140 Type 2 diabetes mellitus with diabetic neuropathy, unspecified: Secondary | ICD-10-CM | POA: Diagnosis not present

## 2015-07-29 DIAGNOSIS — Z9841 Cataract extraction status, right eye: Secondary | ICD-10-CM

## 2015-07-29 DIAGNOSIS — C9110 Chronic lymphocytic leukemia of B-cell type not having achieved remission: Secondary | ICD-10-CM | POA: Diagnosis not present

## 2015-07-29 DIAGNOSIS — E039 Hypothyroidism, unspecified: Secondary | ICD-10-CM | POA: Diagnosis present

## 2015-07-29 DIAGNOSIS — Z90710 Acquired absence of both cervix and uterus: Secondary | ICD-10-CM

## 2015-07-29 DIAGNOSIS — I63032 Cerebral infarction due to thrombosis of left carotid artery: Secondary | ICD-10-CM | POA: Diagnosis not present

## 2015-07-29 DIAGNOSIS — I6330 Cerebral infarction due to thrombosis of unspecified cerebral artery: Secondary | ICD-10-CM | POA: Diagnosis not present

## 2015-07-29 DIAGNOSIS — Z91041 Radiographic dye allergy status: Secondary | ICD-10-CM

## 2015-07-29 DIAGNOSIS — I341 Nonrheumatic mitral (valve) prolapse: Secondary | ICD-10-CM | POA: Diagnosis present

## 2015-07-29 DIAGNOSIS — D729 Disorder of white blood cells, unspecified: Secondary | ICD-10-CM | POA: Diagnosis not present

## 2015-07-29 DIAGNOSIS — C7951 Secondary malignant neoplasm of bone: Secondary | ICD-10-CM | POA: Diagnosis present

## 2015-07-29 DIAGNOSIS — R200 Anesthesia of skin: Secondary | ICD-10-CM

## 2015-07-29 DIAGNOSIS — I63312 Cerebral infarction due to thrombosis of left middle cerebral artery: Secondary | ICD-10-CM | POA: Diagnosis not present

## 2015-07-29 DIAGNOSIS — M1990 Unspecified osteoarthritis, unspecified site: Secondary | ICD-10-CM | POA: Diagnosis present

## 2015-07-29 DIAGNOSIS — Z7982 Long term (current) use of aspirin: Secondary | ICD-10-CM

## 2015-07-29 DIAGNOSIS — I63512 Cerebral infarction due to unspecified occlusion or stenosis of left middle cerebral artery: Secondary | ICD-10-CM | POA: Diagnosis not present

## 2015-07-29 DIAGNOSIS — N390 Urinary tract infection, site not specified: Secondary | ICD-10-CM | POA: Diagnosis not present

## 2015-07-29 DIAGNOSIS — R4781 Slurred speech: Secondary | ICD-10-CM

## 2015-07-29 DIAGNOSIS — Z79899 Other long term (current) drug therapy: Secondary | ICD-10-CM | POA: Diagnosis not present

## 2015-07-29 LAB — COMPREHENSIVE METABOLIC PANEL
ALT: 19 U/L (ref 14–54)
AST: 20 U/L (ref 15–41)
Albumin: 3.4 g/dL — ABNORMAL LOW (ref 3.5–5.0)
Alkaline Phosphatase: 79 U/L (ref 38–126)
Anion gap: 11 (ref 5–15)
BUN: 9 mg/dL (ref 6–20)
CO2: 24 mmol/L (ref 22–32)
Calcium: 9.3 mg/dL (ref 8.9–10.3)
Chloride: 102 mmol/L (ref 101–111)
Creatinine, Ser: 0.87 mg/dL (ref 0.44–1.00)
GFR calc Af Amer: >60 mL/min (ref >60)
GFR calc non Af Amer: 57 mL/min — ABNORMAL LOW (ref >60)
Glucose, Bld: 267 mg/dL — ABNORMAL HIGH (ref 65–99)
Potassium: 3.5 mmol/L (ref 3.5–5.1)
Sodium: 137 mmol/L (ref 135–145)
Total Bilirubin: 0.6 mg/dL (ref 0.3–1.2)
Total Protein: 6.2 g/dL — ABNORMAL LOW (ref 6.5–8.1)

## 2015-07-29 LAB — I-STAT CHEM 8, ED
BUN: 11 mg/dL (ref 6–20)
Calcium, Ion: 1.1 mmol/L — ABNORMAL LOW (ref 1.13–1.30)
Chloride: 99 mmol/L — ABNORMAL LOW (ref 101–111)
Creatinine, Ser: 0.7 mg/dL (ref 0.44–1.00)
Glucose, Bld: 275 mg/dL — ABNORMAL HIGH (ref 65–99)
HCT: 46 % (ref 36.0–46.0)
Hemoglobin: 15.6 g/dL — ABNORMAL HIGH (ref 12.0–15.0)
Potassium: 3.4 mmol/L — ABNORMAL LOW (ref 3.5–5.1)
Sodium: 136 mmol/L (ref 135–145)
TCO2: 24 mmol/L (ref 0–100)

## 2015-07-29 LAB — DIFFERENTIAL
Basophils Absolute: 0 10*3/uL (ref 0.0–0.1)
Basophils Relative: 0 %
Eosinophils Absolute: 0.2 10*3/uL (ref 0.0–0.7)
Eosinophils Relative: 1 %
Lymphocytes Relative: 46 %
Lymphs Abs: 10.9 10*3/uL — ABNORMAL HIGH (ref 0.7–4.0)
Monocytes Absolute: 1.2 10*3/uL — ABNORMAL HIGH (ref 0.1–1.0)
Monocytes Relative: 5 %
Neutro Abs: 11.5 10*3/uL — ABNORMAL HIGH (ref 1.7–7.7)
Neutrophils Relative %: 48 %

## 2015-07-29 LAB — URINALYSIS W MICROSCOPIC (NOT AT ARMC)
Bilirubin Urine: NEGATIVE
Glucose, UA: NEGATIVE mg/dL
Hgb urine dipstick: NEGATIVE
KETONES UR: NEGATIVE mg/dL
NITRITE: NEGATIVE
PROTEIN: 100 mg/dL — AB
Specific Gravity, Urine: 1.014 (ref 1.005–1.030)
UROBILINOGEN UA: 0.2 mg/dL (ref 0.0–1.0)
pH: 6.5 (ref 5.0–8.0)

## 2015-07-29 LAB — APTT: aPTT: 33 seconds (ref 24–37)

## 2015-07-29 LAB — CBG MONITORING, ED: GLUCOSE-CAPILLARY: 262 mg/dL — AB (ref 65–99)

## 2015-07-29 LAB — I-STAT TROPONIN, ED: Troponin i, poc: 0.02 ng/mL (ref 0.00–0.08)

## 2015-07-29 LAB — CBC
HCT: 42.6 % (ref 36.0–46.0)
Hemoglobin: 14.3 g/dL (ref 12.0–15.0)
MCH: 30.2 pg (ref 26.0–34.0)
MCHC: 33.6 g/dL (ref 30.0–36.0)
MCV: 89.9 fL (ref 78.0–100.0)
PLATELETS: 292 10*3/uL (ref 150–400)
RBC: 4.74 MIL/uL (ref 3.87–5.11)
RDW: 13.7 % (ref 11.5–15.5)
WBC: 23.8 10*3/uL — ABNORMAL HIGH (ref 4.0–10.5)

## 2015-07-29 LAB — PROTIME-INR
INR: 1.11 (ref 0.00–1.49)
Prothrombin Time: 14.5 seconds (ref 11.6–15.2)

## 2015-07-29 LAB — GLUCOSE, CAPILLARY
GLUCOSE-CAPILLARY: 180 mg/dL — AB (ref 65–99)
GLUCOSE-CAPILLARY: 194 mg/dL — AB (ref 65–99)

## 2015-07-29 LAB — MRSA PCR SCREENING: MRSA by PCR: NEGATIVE

## 2015-07-29 MED ORDER — INSULIN ASPART 100 UNIT/ML ~~LOC~~ SOLN
0.0000 [IU] | Freq: Three times a day (TID) | SUBCUTANEOUS | Status: DC
  Administered 2015-07-29: 3 [IU] via SUBCUTANEOUS
  Administered 2015-07-30: 5 [IU] via SUBCUTANEOUS
  Administered 2015-07-30: 3 [IU] via SUBCUTANEOUS
  Administered 2015-07-30: 5 [IU] via SUBCUTANEOUS
  Administered 2015-07-31: 3 [IU] via SUBCUTANEOUS
  Administered 2015-07-31: 5 [IU] via SUBCUTANEOUS
  Administered 2015-07-31 – 2015-08-01 (×2): 3 [IU] via SUBCUTANEOUS
  Administered 2015-08-01: 5 [IU] via SUBCUTANEOUS
  Administered 2015-08-01: 2 [IU] via SUBCUTANEOUS
  Administered 2015-08-02: 100 [IU] via SUBCUTANEOUS
  Administered 2015-08-02: 5 [IU] via SUBCUTANEOUS
  Administered 2015-08-02: 3 [IU] via SUBCUTANEOUS

## 2015-07-29 MED ORDER — ANASTROZOLE 1 MG PO TABS
1.0000 mg | ORAL_TABLET | Freq: Every day | ORAL | Status: DC
  Administered 2015-07-30 – 2015-08-01 (×3): 1 mg via ORAL
  Filled 2015-07-29 (×4): qty 1

## 2015-07-29 MED ORDER — LEVOTHYROXINE SODIUM 50 MCG PO TABS
75.0000 ug | ORAL_TABLET | Freq: Every day | ORAL | Status: DC
  Administered 2015-07-30 – 2015-08-02 (×4): 75 ug via ORAL
  Filled 2015-07-29 (×6): qty 1

## 2015-07-29 MED ORDER — HYDRALAZINE HCL 20 MG/ML IJ SOLN
5.0000 mg | Freq: Once | INTRAMUSCULAR | Status: AC
  Administered 2015-07-29: 5 mg via INTRAVENOUS

## 2015-07-29 MED ORDER — INSULIN GLARGINE 100 UNIT/ML SOLOSTAR PEN: 18.0000 [IU] | PEN_INJECTOR | Freq: Every day | SUBCUTANEOUS | Status: DC

## 2015-07-29 MED ORDER — PANTOPRAZOLE SODIUM 40 MG PO TBEC: 40.0000 mg | DELAYED_RELEASE_TABLET | Freq: Every day | ORAL | Status: DC

## 2015-07-29 MED ORDER — PANTOPRAZOLE SODIUM 40 MG PO TBEC
40.0000 mg | DELAYED_RELEASE_TABLET | Freq: Every day | ORAL | Status: DC
  Administered 2015-07-29 – 2015-08-02 (×5): 40 mg via ORAL
  Filled 2015-07-29 (×5): qty 1

## 2015-07-29 MED ORDER — HYDRALAZINE HCL 20 MG/ML IJ SOLN
INTRAMUSCULAR | Status: AC
  Administered 2015-07-29: 5 mg via INTRAVENOUS
  Filled 2015-07-29: qty 1

## 2015-07-29 MED ORDER — PANTOPRAZOLE SODIUM 40 MG IV SOLR: 40.0000 mg | Freq: Every day | INTRAVENOUS | Status: DC

## 2015-07-29 MED ORDER — ACETAMINOPHEN 500 MG PO TABS: 500.0000 mg | ORAL_TABLET | Freq: Four times a day (QID) | ORAL | Status: DC | PRN

## 2015-07-29 MED ORDER — SENNOSIDES-DOCUSATE SODIUM 8.6-50 MG PO TABS: 1.0000 | ORAL_TABLET | Freq: Every evening | ORAL | Status: DC | PRN

## 2015-07-29 MED ORDER — ONDANSETRON HCL 4 MG/2ML IJ SOLN
4.0000 mg | Freq: Four times a day (QID) | INTRAMUSCULAR | Status: DC | PRN
  Administered 2015-07-29 – 2015-07-31 (×3): 4 mg via INTRAVENOUS
  Filled 2015-07-29 (×4): qty 2

## 2015-07-29 MED ORDER — SODIUM CHLORIDE 0.9 % IV SOLN
INTRAVENOUS | Status: DC
  Administered 2015-07-29: 14:00:00 via INTRAVENOUS

## 2015-07-29 MED ORDER — STROKE: EARLY STAGES OF RECOVERY BOOK
Freq: Once | Status: AC
  Administered 2015-07-29: 14:00:00
  Filled 2015-07-29: qty 1

## 2015-07-29 MED ORDER — ACETAMINOPHEN 650 MG RE SUPP: 650.0000 mg | RECTAL | Status: DC | PRN

## 2015-07-29 MED ORDER — ALTEPLASE (STROKE) FULL DOSE INFUSION
0.9000 mg/kg | Freq: Once | INTRAVENOUS | Status: AC
  Administered 2015-07-29: 77 mg via INTRAVENOUS
  Filled 2015-07-29: qty 77

## 2015-07-29 MED ORDER — ACETAMINOPHEN 325 MG PO TABS
650.0000 mg | ORAL_TABLET | ORAL | Status: DC | PRN
  Administered 2015-07-29 – 2015-07-30 (×3): 325 mg via ORAL
  Filled 2015-07-29 (×3): qty 2

## 2015-07-29 MED ORDER — INSULIN GLARGINE 100 UNIT/ML ~~LOC~~ SOLN
18.0000 [IU] | Freq: Every day | SUBCUTANEOUS | Status: DC
  Administered 2015-07-30 – 2015-08-02 (×4): 18 [IU] via SUBCUTANEOUS
  Filled 2015-07-29 (×6): qty 0.18

## 2015-07-29 NOTE — ED Provider Notes (Signed)
CSN: [id number]     Arrival date & time 07/29/15  1056 History   First MD Initiated Contact with Patient 07/29/15 1058     Chief Complaint  Patient presents with  . Code Stroke   Patient is a 81 y.o. female presenting with general illness. The history is provided by the patient and the EMS personnel. No language interpreter was used.  Illness Location:  Generalized Quality:  R facial droop, R sided weakness, dysarthria Severity:  Severe Onset quality:  Sudden Timing:  Constant Progression:  Unchanged Chronicity:  New Context:  PMHx of HTN, HLD, DM, MVP, and R breast CA presenting with slurred speech, facial droop, and right-sided weakness. Last known normal at 7 AM when patient awoke. No previous history of similar symptoms. No improvement in symptoms since onset. Associated symptoms: no chest pain, no cough and no fever     Past Medical History  Diagnosis Date  . Hypertension   . MVP (mitral valve prolapse)   . Hypothyroidism   . Anemia   . Hypercholesterolemia   . Heart murmur   . Type II diabetes mellitus   . Arthritis     "back; knees, hands" (05/11/2015)  . Breast cancer, right breast     "cells went into the blood and caused her hip to break" (05/11/2015)   Past Surgical History  Procedure Laterality Date  . Hemiarthroplasty hip Right     "partial replacement"  . Pelvic and para-aortic lymph node dissection    . Tonsillectomy    . Appendectomy    . Cholecystectomy    . US echocardiography  03/30/2009    EF 55-60%  . Cardiovascular stress test  01/28/2008    EF 74%  . Esophagogastroduodenoscopy N/A 07/08/2014    Procedure: ESOPHAGOGASTRODUODENOSCOPY (EGD);  Surgeon: Winfield Cunas., MD;  Location: [facility];  Service: Endoscopy;  Laterality: N/A;  . Colonoscopy N/A 07/10/2014    Procedure: COLONOSCOPY;  Surgeon: Lear Ng, MD;  Location: [facility];  Service: Endoscopy;  Laterality: N/A;  . Total abdominal hysterectomy      w/BSO  . Dilation and  curettage of uterus    . Breast biopsy Right 2009  . Breast lumpectomy Right 2009  . Fracture surgery    . Cataract extraction w/ intraocular lens  implant, bilateral Bilateral    Family History  Problem Relation Age of Onset  . Hypertension Mother   . Heart attack Father   . Hypertension Father   . Diabetes Sister   . Hypertension Sister   . Liver cancer Sister   . Heart attack Brother   . Hypertension Brother   . Diabetes Brother   . Diabetes Sister   . Hypertension Sister    Social History  Substance Use Topics  . Smoking status: Never Smoker   . Smokeless tobacco: Never Used  . Alcohol Use: No   OB History    No data available      Review of Systems  Constitutional: Negative for fever and chills.  Respiratory: Negative for cough.   Cardiovascular: Negative for chest pain, palpitations and leg swelling.  Neurological: Positive for facial asymmetry, speech difficulty, weakness and numbness. Negative for tremors, seizures and syncope.  All other systems reviewed and are negative.   Allergies  Amlodipine; Codeine; Demerol; Librium; Lipitor; Metoprolol; Contrast media; and Hydralazine  Home Medications   Prior to Admission medications   Medication Sig Start Date End Date Taking? Authorizing Provider  acetaminophen (TYLENOL) 500 MG tablet Take  500 mg by mouth every 6 (six) hours as needed (pain).    Historical Provider, MD  amLODipine-valsartan (EXFORGE) 10-320 MG per tablet Take 1 tablet by mouth daily. 03/22/15   Darlin Coco, MD  anastrozole (ARIMIDEX) 1 MG tablet TAKE 1 TABLET BY MOUTH EVERY DAY 02/28/15   Ladell Pier, MD  aspirin EC 81 MG tablet Take 81 mg by mouth daily.    Historical Provider, MD  atenolol (TENORMIN) 50 MG tablet Take 1 tablet (50 mg total) by mouth 3 (three) times daily. 07/06/15   Darlin Coco, MD  Calcium Carbonate-Vitamin D (CALCIUM + D PO) Take 1 tablet by mouth daily after lunch.     Historical Provider, MD  cholecalciferol  (VITAMIN D) 1000 UNITS tablet Take 1,000 Units by mouth daily after lunch.     Historical Provider, MD  Cyanocobalamin (B-12) 500 MCG TABS Take 500 mcg by mouth daily after lunch.     Historical Provider, MD  docusate sodium (COLACE) 100 MG capsule Take 2 capsules (200 mg total) by mouth 2 (two) times daily as needed for mild constipation. 06/21/15   Thurnell Lose, MD  ezetimibe (ZETIA) 10 MG tablet Take 1 tablet (10 mg total) by mouth daily. Patient taking differently: Take 10 mg by mouth daily at 6 PM.  09/07/14   Darlin Coco, MD  FERROCITE 324 MG TABS TAKE 1 TABLET (106 MG OF IRON TOTAL) BY MOUTH DAILY. Patient taking differently: TAKE 1 TABLET (106 MG OF IRON TOTAL) BY MOUTH DAILY AFTER LUNCH 02/28/15   Ladell Pier, MD  HYDROcodone-acetaminophen (NORCO/VICODIN) 5-325 MG per tablet Take 1 tablet by mouth every 6 (six) hours as needed for moderate pain. Patient not taking: Reported on 07/22/2015 06/21/15   Thurnell Lose, MD  LANTUS SOLOSTAR 100 UNIT/ML Solostar Pen Inject 18 Units into the skin daily.  07/01/15   Historical Provider, MD  levothyroxine (SYNTHROID, LEVOTHROID) 75 MCG tablet Take 75 mcg by mouth daily before breakfast.     Historical Provider, MD  nitrofurantoin, macrocrystal-monohydrate, (MACROBID) 100 MG capsule Take 100 mg by mouth every 12 (twelve) hours. 07/19/15   Historical Provider, MD  polyethylene glycol (MIRALAX / GLYCOLAX) packet Take 17 g by mouth daily. Patient not taking: Reported on 07/22/2015 06/21/15   Thurnell Lose, MD  potassium chloride (KLOR-CON M10) 10 MEQ tablet TAKE 1 TABLET (10 MEQ TOTAL) BY MOUTH DAILY. Patient taking differently: Take 10 mEq by mouth daily after lunch.  09/07/14   Darlin Coco, MD   BP 160/53 mmHg  Pulse 70  Temp(Src) 97.7 F (36.5 C) (Oral)  Resp 20  Ht 5' 2.99" (1.6 m)  Wt 188 lb 15 oz (85.7 kg)  BMI 33.48 kg/m2  SpO2 98%   Physical Exam  Constitutional: She is oriented to person, place, and time. No distress.   Elderly female lying in stretcher in no acute distress.  HENT:  Head: Normocephalic and atraumatic.  Eyes: Conjunctivae are normal. Pupils are equal, round, and reactive to light.  Neck: Normal range of motion. Neck supple.  Cardiovascular: Normal rate, regular rhythm and intact distal pulses.   Hypertensive  Pulmonary/Chest: Effort normal and breath sounds normal. No respiratory distress. She has no wheezes.  Abdominal: Soft. Bowel sounds are normal. She exhibits no distension. There is no tenderness.  Musculoskeletal: Normal range of motion.  Neurological: She is alert and oriented to person, place, and time. She exhibits normal muscle tone.  Right-sided facial droop, right facial numbness, RUE/RLE weakness (3/5 strength),  and dysarthria but no aphasia.  Skin: She is not diaphoretic.  Nursing note and vitals reviewed.   ED Course  Procedures   Labs Review Labs Reviewed  CBC - Abnormal; Notable for the following:    WBC 23.8 (*)    All other components within normal limits  DIFFERENTIAL - Abnormal; Notable for the following:    Neutro Abs 11.5 (*)    Lymphs Abs 10.9 (*)    Monocytes Absolute 1.2 (*)    All other components within normal limits  COMPREHENSIVE METABOLIC PANEL - Abnormal; Notable for the following:    Glucose, Bld 267 (*)    Total Protein 6.2 (*)    Albumin 3.4 (*)    GFR calc non Af Amer 57 (*)    All other components within normal limits  CBG MONITORING, ED - Abnormal; Notable for the following:    Glucose-Capillary 262 (*)    All other components within normal limits  I-STAT CHEM 8, ED - Abnormal; Notable for the following:    Potassium 3.4 (*)    Chloride 99 (*)    Glucose, Bld 275 (*)    Calcium, Ion 1.10 (*)    Hemoglobin 15.6 (*)    All other components within normal limits  CULTURE, BLOOD (ROUTINE X 2)  CULTURE, BLOOD (ROUTINE X 2)  URINE CULTURE  PROTIME-INR  APTT  URINALYSIS, ROUTINE W REFLEX MICROSCOPIC (NOT AT [name]  URINALYSIS W  MICROSCOPIC  I-STAT TROPOININ, ED   Imaging Review Ct Head Wo Contrast  07/29/2015   CLINICAL DATA:  Acute onset right-sided weakness  EXAM: CT HEAD WITHOUT CONTRAST  TECHNIQUE: Contiguous axial images were obtained from the base of the skull through the vertex without intravenous contrast.  COMPARISON:  May 11, 2015  FINDINGS: Mild diffuse atrophy is stable. There is no intracranial mass, hemorrhage, extra-axial fluid collection, or midline shift. There is small vessel disease throughout the centra semiovale bilaterally. There is small vessel disease in both external capsules, stable. Small vessel disease is also noted in the superior left thalamus. No acute infarct evident. The middle cerebral arteries do not show increased attenuation. There is atherosclerotic calcification in the proximal left middle cerebral artery, a stable finding. Bony calvarium appears intact. The mastoid air cells are clear. There are small retention cysts in the right maxillary antrum.  IMPRESSION: Atrophy with extensive supratentorial small vessel disease, stable. No acute infarct evident. No hemorrhage or mass effect. Calcification proximal left middle cerebral artery is a stable finding. There are retention cysts in the right maxillary antrum.  Critical Value/emergent results were called by telephone at the time of interpretation on 07/29/2015 at 11:18 am to Dr. Nicole Kindred, neurology  , who verbally acknowledged these results.   Electronically Signed   By: Lowella Grip III M.D.   On: 07/29/2015 11:19   I have personally reviewed and evaluated these images and lab results as part of my medical decision-making.   EKG Interpretation   Date/Time:  Friday July 29 2015 11:22:07 EDT Ventricular Rate:  66 PR Interval:  168 QRS Duration: 152 QT Interval:  468 QTC Calculation: 490 R Axis:   1 Text Interpretation:  Sinus rhythm Right bundle branch block since last  tracing no significant change Confirmed by BELFI  MD,  MELANIE ([id number]) on  07/29/2015 11:25:13 AM      MDM  Ms. Moise is an 81 yo female w/ PMHx of HTN, HLD, DM, MVP, CLL and metastatic breast CA presenting with slurred speech, facial  droop, and right-sided weakness. Last known normal at 7 AM when patient awoke. No previous history of similar symptoms. No improvement in symptoms since onset.  Elderly female lying in the stretcher in NAD. Afebrile. Not tachycardic. Hypertensive. Breathing well on RA and maintaining saturations without supplemental oxygen. Lungs CTAB. Abd benign. CV RRR. Neuro exam notable for right-sided facial droop, right facial numbness, RUE/RLE weakness (3/5 strength), and dysarthria but no aphasia. Remainder of examination unremarkable.  Code stroke called. Neurology present at bedside for initial evaluation. CT head showing no acute hemorrhage, mass effect, or evidence infarct. WBC 23.8 (known CLL) but otherwise unremarkable. Chem 8 notable for glucose of 275 but otherwise grossly unremarkable. Laboratory and imaging results were personally reviewed by myself and used in the medical decision making of this patient's treatment and disposition.  TPA administered per neurology recommendations. Pt admitted to neurology service for further evaluation and management of presumed CVA. Pt understands and agrees with the plan and has no further questions or concerns.   Pt care discussed with and followed by my attending, Dr. Malvin Johns  Mayer Camel, MD Pager [phone]   Final diagnoses:  Slurred speech  Right sided weakness  Numbness  Cerebral infarction due to unspecified mechanism    Mayer Camel, MD 07/29/15 [place], MD 07/29/15 1225

## 2015-07-29 NOTE — Progress Notes (Signed)
Dr. Nicole Kindred paged and aware of pt's report of a mild headache and nausea (no vomiting). Repeated NIHSS and found pt's R arm 2 on NIH. Pt's speech remains slurred but able to be understood. No other changes noted w/ NIH. Order received for zofran. Will continue to monitor.

## 2015-07-29 NOTE — Progress Notes (Signed)
Initial Nutrition Assessment  DOCUMENTATION CODES:   Obesity unspecified  INTERVENTION:   Advance diet as medically appropriate, add interventions accordingly  NUTRITION DIAGNOSIS:   Inadequate oral intake related to inability to eat as evidenced by NPO status  GOAL:   Patient will meet greater than or equal to 90% of their needs  MONITOR:   Diet advancement, PO intake, Labs, Weight trends, I & O's  REASON FOR ASSESSMENT:   Malnutrition Screening Tool  ASSESSMENT:   81 y.o. Female who was last seen normal by her daughter at 52 . She went to take a nap and awoke with right sided weakness. EMS was called and noted right sided facial droop and weakness. EMS brought her to hospital as code stroke. CT obtained and showed no acute findings. Patient was still within tPA window and tPA was administered.    RD unable to obtain nutrition hx.  Pt aphasic.  No family at bedside.  Pt seen per Clinical Nutrition during previous hospital stay in June 2016.  Pt with hx of poor appetite.  Currently NPO.  RD to monitor PO diet advancement, add oral nutrition supplements as needed.  RD unable to complete Nutrition Focused Physical Exam at this time.  Diet Order:  Diet NPO time specified  Skin:  Reviewed, no issues  Last BM:  9/16  Height:   Ht Readings from Last 1 Encounters:  07/29/15 5\' 6"  (1.676 m)    Weight:   Wt Readings from Last 1 Encounters:  07/29/15 189 lb 6 oz (85.9 kg)    Ideal Body Weight:  59 kg  BMI:  Body mass index is 30.58 kg/(m^2).  Estimated Nutritional Needs:   Kcal:  1700-1900  Protein:  90-100 gm  Fluid:  1.7-1.9 L  EDUCATION NEEDS:   No education needs identified at this time  Arthur Holms, RD, LDN Pager #: [phone] After-Hours Pager #: [phone]

## 2015-07-29 NOTE — ED Notes (Signed)
Pt to department via EMS- pt reports that she took a nap this morning and woke up with right arm and leg weakness, and facial droop. Family reports this morning at 710 the patient was normal. Bp-222/98 Hr-72  CBG-225 20g L wrist. Pt also with slurred speech.

## 2015-07-29 NOTE — Code Documentation (Signed)
81yo female arriving to [facility] via [place] at 58.  EMS reports that the patient was LKW at [place] this morning when he daughter left the house.  Patient took at nap and woke up at 1000.  Initially patient reported that she was fine at that time, but later reported that she noticed she was leaning to the right.  EMS called and activated a Code Stroke for right sided weakness and right facial droop.  Stroke team at the bedside on patient arrival.  Labs drawn and patient cleared by Dr. Tamera Punt.  Patient to CT.  Neurologist to the bedside.  NIHSS 8, see documentation for details and code stroke times.  Patient with right facial droop, right sided weakness, right sided decreased sensation and dysarthria.  2nd PIV started by ED RN.  Pharmacy at the bedside and notified to mix tPA by Dr. Nicole Kindred.  Patient reported to be hypertensive en route.  BP taken manually and resulted at 183/68.  Order to start tPA per Dr. Nicole Kindred.  8mg  tPA bolus given over 1 minute at 1126 followed by 69mg /hr for a total dose of 77mg  per pharmacy dosing.  BP 190/70 manually at 1135.  Dr. Nicole Kindred at the bedside and aware.  Order to give Hydralazine 5mg  IVP (MD aware of documentation of medication intolerance) and to continue tPA infusion.  Hydralazine given per order.  BP continued to be elevated, MD aware and order to repeat Hydralazine 5mg  IVP.  Hydralazine given resulting in decrease in BP.  Patient monitored per post-tPA protocol.  Patient c/o her "stomach hurting" and "sick to her stomach" and Dr. Nicole Kindred at the bedside and aware, no new orders.  Patient with slight improvement in RUE weakness on exam.  Patient continued to deny HA.  Report given to 27M RN by ED RN Ria Comment.  Patient transported to 27M11 by Stroke RN, ED RN and Neurologist.  Bedside handoff with 27M RNs [place] and [place].

## 2015-07-29 NOTE — ED Notes (Signed)
CBG 262. °

## 2015-07-29 NOTE — H&P (Signed)
H&P    Chief Complaint: code stroke  HPI:                                                                                                                                         Molly [name] is an 81 y.o. female who was last seen normal by her daughter at 51 .  She went to take a nap and awoke with right sided weakness. EMS was called and noted right sided facial droop and weakness. EMS brought her to hospital as code stroke.  CT obtained and showed no acute findings. Patient was still within tPA window and tPA was administered. NIH stroke score was 7. Blood pressure was elevated requiring intervention with IV hydralazine.  Date last known well: Date: 07/29/2015 Time last known well: Time: 07:10 tPA Given: Yes Modified Rankin: Rankin Score=0    Past Medical History  Diagnosis Date  . Hypertension   . MVP (mitral valve prolapse)   . Hypothyroidism   . Anemia   . Hypercholesterolemia   . Heart murmur   . Type II diabetes mellitus   . Arthritis     "back; knees, hands" (05/11/2015)  . Breast cancer, right breast     "cells went into the blood and caused her hip to break" (05/11/2015)    Past Surgical History  Procedure Laterality Date  . Hemiarthroplasty hip Right     "partial replacement"  . Pelvic and para-aortic lymph node dissection    . Tonsillectomy    . Appendectomy    . Cholecystectomy    . US echocardiography  03/30/2009    EF 55-60%  . Cardiovascular stress test  01/28/2008    EF 74%  . Esophagogastroduodenoscopy N/A 07/08/2014    Procedure: ESOPHAGOGASTRODUODENOSCOPY (EGD);  Surgeon: Winfield Cunas., MD;  Location: [facility];  Service: Endoscopy;  Laterality: N/A;  . Colonoscopy N/A 07/10/2014    Procedure: COLONOSCOPY;  Surgeon: Lear Ng, MD;  Location: [facility];  Service: Endoscopy;  Laterality: N/A;  . Total abdominal hysterectomy      w/BSO  . Dilation and curettage of uterus    . Breast biopsy Right 2009  . Breast  lumpectomy Right 2009  . Fracture surgery    . Cataract extraction w/ intraocular lens  implant, bilateral Bilateral     Family History  Problem Relation Age of Onset  . Hypertension Mother   . Heart attack Father   . Hypertension Father   . Diabetes Sister   . Hypertension Sister   . Liver cancer Sister   . Heart attack Brother   . Hypertension Brother   . Diabetes Brother   . Diabetes Sister   . Hypertension Sister    Social History:  reports that she has never smoked. She has never used smokeless tobacco. She reports that she does not drink alcohol or use illicit drugs.  Allergies:  Allergies  Allergen Reactions  . Amlodipine Swelling    edema  . Codeine Other (See Comments)    Makes her feel "crazy."  . Demerol Other (See Comments)    Makes her feel "crazy."  . Librium Other (See Comments)    Makes her feel "crazy."  . Lipitor [Atorvastatin Calcium] Other (See Comments)    Makes her feel "crazy."  . Metoprolol Other (See Comments)    Pt was told not to take this but was not told why  . Contrast Media [Iodinated Diagnostic Agents] Other (See Comments)    Patient/family unsure if CT or MRI contrast. Also, unsure of reaction type.  Marland Kitchen Hydralazine     Per patient dropper her blood pressure too low    Medications:                                                                                                                           Current Facility-Administered Medications  Medication Dose Route Frequency Provider Last Rate Last Dose  . alteplase (ACTIVASE) 1 mg/mL infusion 77 mg  0.9 mg/kg Intravenous Once Wallie Char       Current Outpatient Prescriptions  Medication Sig Dispense Refill  . acetaminophen (TYLENOL) 500 MG tablet Take 500 mg by mouth every 6 (six) hours as needed (pain).    Marland Kitchen amLODipine-valsartan (EXFORGE) 10-320 MG per tablet Take 1 tablet by mouth daily. 30 tablet 5  . anastrozole (ARIMIDEX) 1 MG tablet TAKE 1 TABLET BY MOUTH EVERY DAY 30  tablet 5  . aspirin EC 81 MG tablet Take 81 mg by mouth daily.    Marland Kitchen atenolol (TENORMIN) 50 MG tablet Take 1 tablet (50 mg total) by mouth 3 (three) times daily. 90 tablet 5  . Calcium Carbonate-Vitamin D (CALCIUM + D PO) Take 1 tablet by mouth daily after lunch.     . cholecalciferol (VITAMIN D) 1000 UNITS tablet Take 1,000 Units by mouth daily after lunch.     . Cyanocobalamin (B-12) 500 MCG TABS Take 500 mcg by mouth daily after lunch.     . docusate sodium (COLACE) 100 MG capsule Take 2 capsules (200 mg total) by mouth 2 (two) times daily as needed for mild constipation. 10 capsule 0  . ezetimibe (ZETIA) 10 MG tablet Take 1 tablet (10 mg total) by mouth daily. (Patient taking differently: Take 10 mg by mouth daily at 6 PM. ) 30 tablet 11  . FERROCITE 324 MG TABS TAKE 1 TABLET (106 MG OF IRON TOTAL) BY MOUTH DAILY. (Patient taking differently: TAKE 1 TABLET (106 MG OF IRON TOTAL) BY MOUTH DAILY AFTER LUNCH) 60 tablet 5  . HYDROcodone-acetaminophen (NORCO/VICODIN) 5-325 MG per tablet Take 1 tablet by mouth every 6 (six) hours as needed for moderate pain. (Patient not taking: Reported on 07/22/2015) 20 tablet 0  . LANTUS SOLOSTAR 100 UNIT/ML Solostar Pen Inject 18 Units into the skin daily.   5  . levothyroxine (SYNTHROID, LEVOTHROID) 75 MCG tablet Take  75 mcg by mouth daily before breakfast.     . nitrofurantoin, macrocrystal-monohydrate, (MACROBID) 100 MG capsule Take 100 mg by mouth every 12 (twelve) hours.  0  . polyethylene glycol (MIRALAX / GLYCOLAX) packet Take 17 g by mouth daily. (Patient not taking: Reported on 07/22/2015) 14 each 0  . potassium chloride (KLOR-CON M10) 10 MEQ tablet TAKE 1 TABLET (10 MEQ TOTAL) BY MOUTH DAILY. (Patient taking differently: Take 10 mEq by mouth daily after lunch. ) 30 tablet 11      ROS:                                                                                                                                       History obtained from the  patient  General ROS: negative for - chills, fatigue, fever, night sweats, weight gain or weight loss Psychological ROS: negative for - behavioral disorder, hallucinations, memory difficulties, mood swings or suicidal ideation Ophthalmic ROS: negative for - blurry vision, double vision, eye pain or loss of vision ENT ROS: negative for - epistaxis, nasal discharge, oral lesions, sore throat, tinnitus or vertigo Allergy and Immunology ROS: negative for - hives or itchy/watery eyes Hematological and Lymphatic ROS: negative for - bleeding problems, bruising or swollen lymph nodes Endocrine ROS: negative for - galactorrhea, hair pattern changes, polydipsia/polyuria or temperature intolerance Respiratory ROS: negative for - cough, hemoptysis, shortness of breath or wheezing Cardiovascular ROS: negative for - chest pain, dyspnea on exertion, edema or irregular heartbeat Gastrointestinal ROS: negative for - abdominal pain, diarrhea, hematemesis, nausea/vomiting or stool incontinence Genito-Urinary ROS: negative for - dysuria, hematuria, incontinence or urinary frequency/urgency Musculoskeletal ROS: negative for - joint swelling or muscular weakness Neurological ROS: as noted in HPI Dermatological ROS: negative for rash and skin lesion changes  Neurologic Examination:                                                                                                      Height 5' 2.99" (1.6 m), weight 85.7 kg (188 lb 15 oz).  HEENT-  Normocephalic, no lesions, without obvious abnormality.  Normal external eye and conjunctiva.  Normal TM's bilaterally.  Normal auditory canals and external ears. Normal external nose, mucus membranes and septum.  Normal pharynx. Cardiovascular- S1, S2 normal, pulses palpable throughout   Lungs- chest clear, no wheezing, rales, normal symmetric air entry Abdomen- normal findings: bowel sounds normal Extremities- no edema Lymph-no adenopathy palpable Musculoskeletal-no  joint tenderness, deformity or swelling Skin-warm and dry, no hyperpigmentation,  vitiligo, or suspicious lesions  Neurological Examination Mental Status: Alert, oriented, thought content appropriate.  Speech dysarthric without evidence of aphasia.  Able to follow 3 step commands without difficulty. Cranial Nerves: II: Discs flat bilaterally; Visual fields grossly normal, pupils equal, round, reactive to light and accommodation III,IV, VI: ptosis not present, extra-ocular motions intact bilaterally V,VII: smile asymmetric on the right, facial light touch sensation decreased on the right VIII: hearing normal bilaterally IX,X: uvula rises symmetrically XI: bilateral shoulder shrug XII: midline tongue extension Motor: Right : Upper extremity   3/5    Left:     Upper extremity   5/5  Lower extremity   3/5     Lower extremity   5/5 Tone and bulk:normal tone throughout; no atrophy noted Sensory: Pinprick and light touch decreased on the right Deep Tendon Reflexes: 1+ and symmetric throughout with no AJ Plantars: Right: downgoing   Left: downgoing Cerebellar: normal finger-to-nose  And H-S on the left Gait: not tested due to multiple leads.        Lab Results: Basic Metabolic Panel:  Recent Labs Lab 07/22/15 1329 07/29/15 1105  NA 136 136  K 3.3* 3.4*  CL 102 99*  CO2 24  --   GLUCOSE 222* 275*  BUN 9 11  CREATININE 0.76 0.70  CALCIUM 9.3  --     Liver Function Tests:  Recent Labs Lab 07/22/15 1329  AST 21  ALT 17  ALKPHOS 83  BILITOT 0.6  PROT 6.5  ALBUMIN 3.3*    Recent Labs Lab 07/22/15 1329  LIPASE 25   No results for input(s): AMMONIA in the last 168 hours.  CBC:  Recent Labs Lab 07/22/15 1329 07/29/15 1105  WBC 25.0*  --   HGB 14.5 15.6*  HCT 43.6 46.0  MCV 89.3  --   PLT 330  --     Cardiac Enzymes:  Recent Labs Lab 07/22/15 1917  TROPONINI <0.03    Lipid Panel: No results for input(s): CHOL, TRIG, HDL, CHOLHDL, VLDL, LDLCALC in  the last 168 hours.  CBG: No results for input(s): GLUCAP in the last 168 hours.  Microbiology: Results for orders placed or performed during the hospital encounter of 06/19/15  Culture, blood (routine x 2)     Status: None   Collection Time: 06/19/15 12:28 PM  Result Value Ref Range Status   Specimen Description BLOOD BLOOD RIGHT FOREARM  Final   Special Requests BOTTLES DRAWN AEROBIC AND ANAEROBIC 4CC  Final   Culture NO GROWTH 5 DAYS  Final   Report Status 06/24/2015 FINAL  Final  Culture, blood (routine x 2)     Status: None   Collection Time: 06/19/15 12:42 PM  Result Value Ref Range Status   Specimen Description BLOOD LEFT HAND  Final   Special Requests BOTTLES DRAWN AEROBIC AND ANAEROBIC 3CC  Final   Culture NO GROWTH 5 DAYS  Final   Report Status 06/24/2015 FINAL  Final  Urine culture     Status: None   Collection Time: 06/19/15  1:39 PM  Result Value Ref Range Status   Specimen Description URINE, CLEAN CATCH  Final   Special Requests NONE  Final   Culture 50,000 COLONIES/mL ESCHERICHIA COLI  Final   Report Status 06/22/2015 FINAL  Final   Organism ID, Bacteria ESCHERICHIA COLI  Final      Susceptibility   Escherichia coli - MIC*    AMPICILLIN >=32 RESISTANT Resistant     CEFAZOLIN 16 SENSITIVE Sensitive  CEFTRIAXONE <=1 SENSITIVE Sensitive     CIPROFLOXACIN >=4 RESISTANT Resistant     GENTAMICIN <=1 SENSITIVE Sensitive     IMIPENEM <=0.25 SENSITIVE Sensitive     NITROFURANTOIN <=16 SENSITIVE Sensitive     TRIMETH/SULFA <=20 SENSITIVE Sensitive     AMPICILLIN/SULBACTAM >=32 RESISTANT Resistant     PIP/TAZO <=4 SENSITIVE Sensitive     * 50,000 COLONIES/mL ESCHERICHIA COLI    Coagulation Studies: No results for input(s): LABPROT, INR in the last 72 hours.  Imaging: No results found.   Etta Quill PA-C [facility] [phone]  07/29/2015, 11:14 AM   Assessment: 81 y.o. female presenting to hospital as code stroke with LSN 0710.  Exam shows  right facial droop, dysarthria and right sided weakness, most likely secondary to a right MCA subcortical acute event.  tPA was administered. She also presented with hypertensive urgency requiring acute management intervention as well.  Stroke Risk Factors - diabetes mellitus, hyperlipidemia and hypertension  Plan: 1. HgbA1c, fasting lipid panel 2. MRI, MRA  of the brain without contrast 3. PT consult, OT consult, Speech consult 4. Echocardiogram 5. Carotid dopplers 6. Prophylactic therapy-Antiplatelet med: Aspirin if CT scan 24 hours post TPA administration shows no intracranial hemorrhage 7. Risk  Factor modification  This patient is critically ill and at significant risk of neurological worsening or death, and care requires constant monitoring of vital signs, hemodynamics,respiratory and cardiac monitoring, neurological assessment, discussion with family, other specialists and medical decision making of high complexity. Total critical care time was 90 minutes.   I personally participated in this patient's evaluation and management, including clinical examination and acute management of hypertensive urgency and IV thrombolytic therapy, as well as formulating above clinical impression and subsequent management recommendations.  Rush Farmer M.D. [facility] [phone]

## 2015-07-30 ENCOUNTER — Inpatient Hospital Stay (HOSPITAL_COMMUNITY): Payer: Medicare Other

## 2015-07-30 DIAGNOSIS — E785 Hyperlipidemia, unspecified: Secondary | ICD-10-CM

## 2015-07-30 DIAGNOSIS — I6789 Other cerebrovascular disease: Secondary | ICD-10-CM

## 2015-07-30 DIAGNOSIS — I63312 Cerebral infarction due to thrombosis of left middle cerebral artery: Secondary | ICD-10-CM

## 2015-07-30 DIAGNOSIS — E1159 Type 2 diabetes mellitus with other circulatory complications: Secondary | ICD-10-CM

## 2015-07-30 DIAGNOSIS — C50919 Malignant neoplasm of unspecified site of unspecified female breast: Secondary | ICD-10-CM

## 2015-07-30 LAB — CBC
HCT: 37.1 % (ref 36.0–46.0)
HEMOGLOBIN: 12.2 g/dL (ref 12.0–15.0)
MCH: 29.8 pg (ref 26.0–34.0)
MCHC: 32.9 g/dL (ref 30.0–36.0)
MCV: 90.5 fL (ref 78.0–100.0)
PLATELETS: 302 10*3/uL (ref 150–400)
RBC: 4.1 MIL/uL (ref 3.87–5.11)
RDW: 13.9 % (ref 11.5–15.5)
WBC: 25.9 10*3/uL — ABNORMAL HIGH (ref 4.0–10.5)

## 2015-07-30 LAB — GLUCOSE, CAPILLARY
GLUCOSE-CAPILLARY: 187 mg/dL — AB (ref 65–99)
GLUCOSE-CAPILLARY: 153 mg/dL — AB (ref 65–99)
Glucose-Capillary: 202 mg/dL — ABNORMAL HIGH (ref 65–99)
Glucose-Capillary: 204 mg/dL — ABNORMAL HIGH (ref 65–99)

## 2015-07-30 LAB — BASIC METABOLIC PANEL
Anion gap: 8 (ref 5–15)
BUN: 9 mg/dL (ref 6–20)
CALCIUM: 8.7 mg/dL — AB (ref 8.9–10.3)
CHLORIDE: 103 mmol/L (ref 101–111)
CO2: 24 mmol/L (ref 22–32)
CREATININE: 0.83 mg/dL (ref 0.44–1.00)
Glucose, Bld: 216 mg/dL — ABNORMAL HIGH (ref 65–99)
Potassium: 2.8 mmol/L — ABNORMAL LOW (ref 3.5–5.1)
SODIUM: 135 mmol/L (ref 135–145)

## 2015-07-30 LAB — LIPID PANEL
CHOLESTEROL: 186 mg/dL (ref 0–200)
HDL: 36 mg/dL — ABNORMAL LOW (ref >40)
LDL Cholesterol: 114 mg/dL — ABNORMAL HIGH (ref 0–99)
Total CHOL/HDL Ratio: 5.2 RATIO
Triglycerides: 182 mg/dL — ABNORMAL HIGH (ref <150)
VLDL: 36 mg/dL (ref 0–40)

## 2015-07-30 MED ORDER — ASPIRIN EC 81 MG PO TBEC
81.0000 mg | DELAYED_RELEASE_TABLET | Freq: Every day | ORAL | Status: DC
  Administered 2015-07-30 – 2015-08-02 (×4): 81 mg via ORAL
  Filled 2015-07-30 (×4): qty 1

## 2015-07-30 MED ORDER — CLOPIDOGREL BISULFATE 75 MG PO TABS
75.0000 mg | ORAL_TABLET | Freq: Every day | ORAL | Status: DC
  Administered 2015-07-30 – 2015-08-02 (×4): 75 mg via ORAL
  Filled 2015-07-30 (×4): qty 1

## 2015-07-30 MED ORDER — SENNOSIDES-DOCUSATE SODIUM 8.6-50 MG PO TABS
1.0000 | ORAL_TABLET | Freq: Two times a day (BID) | ORAL | Status: DC
  Administered 2015-07-30 – 2015-08-02 (×6): 1 via ORAL
  Filled 2015-07-30 (×6): qty 1

## 2015-07-30 MED ORDER — EZETIMIBE 10 MG PO TABS
10.0000 mg | ORAL_TABLET | Freq: Every day | ORAL | Status: DC
  Administered 2015-07-30 – 2015-08-01 (×3): 10 mg via ORAL
  Filled 2015-07-30 (×5): qty 1

## 2015-07-30 MED ORDER — ENOXAPARIN SODIUM 40 MG/0.4ML ~~LOC~~ SOLN
40.0000 mg | SUBCUTANEOUS | Status: DC
  Administered 2015-07-30 – 2015-08-02 (×4): 40 mg via SUBCUTANEOUS
  Filled 2015-07-30 (×4): qty 0.4

## 2015-07-30 MED ORDER — GADOBENATE DIMEGLUMINE 529 MG/ML IV SOLN
20.0000 mL | Freq: Once | INTRAVENOUS | Status: AC | PRN
  Administered 2015-07-30: 20 mL via INTRAVENOUS

## 2015-07-30 NOTE — Progress Notes (Signed)
STROKE TEAM PROGRESS NOTE  HPI Molly [name] is an 81 y.o. female who was last seen normal by her daughter at 84 . She went to take a nap and awoke with right sided weakness. EMS was called and noted right sided facial droop and weakness. EMS brought her to hospital as code stroke. CT obtained and showed no acute findings. Patient was still within tPA window and tPA was administered. NIH stroke score was 7. Blood pressure was elevated requiring intervention with IV hydralazine.  Date last known well: Date: 07/29/2015 Time last known well: Time: 07:10 tPA Given: Yes Modified Rankin: Rankin Score=0   SUBJECTIVE (INTERVAL HISTORY) Her daughter is at the bedside.  Overall she feels her condition is rapidly improving. Her right arm strength is much better but still has right facial droop. She has hx of breast cancer with extensive bone metastasis. Still on anastrozole.    OBJECTIVE Temp:  [97.2 F (36.2 C)-98.4 F (36.9 C)] 98.2 F (36.8 C) (09/17 0400) Pulse Rate:  [63-86] 64 (09/17 0700) Cardiac Rhythm:  [-]  Resp:  [14-29] 14 (09/17 0700) BP: (121-193)/(47-138) 160/56 mmHg (09/17 0700) SpO2:  [91 %-99 %] 92 % (09/17 0700) Weight:  [85.7 kg (188 lb 15 oz)-85.9 kg (189 lb 6 oz)] 85.9 kg (189 lb 6 oz) (09/16 1215)  CBC:  Recent Labs Lab 07/29/15 1059 07/29/15 1105 07/30/15 0239  WBC 23.8*  --  25.9*  NEUTROABS 11.5*  --   --   HGB 14.3 15.6* 12.2  HCT 42.6 46.0 37.1  MCV 89.9  --  90.5  PLT 292  --  563    Basic Metabolic Panel:  Recent Labs Lab 07/29/15 1059 07/29/15 1105 07/30/15 0239  NA 137 136 135  K 3.5 3.4* 2.8*  CL 102 99* 103  CO2 24  --  24  GLUCOSE 267* 275* 216*  BUN 9 11 9   CREATININE 0.87 0.70 0.83  CALCIUM 9.3  --  8.7*    Lipid Panel:    Component Value Date/Time   CHOL 186 07/30/2015 0239   TRIG 182* 07/30/2015 0239   HDL 36* 07/30/2015 0239   CHOLHDL 5.2 07/30/2015 0239   VLDL 36 07/30/2015 0239   LDLCALC 114* 07/30/2015 0239    HgbA1c:  Lab Results  Component Value Date   HGBA1C 9.0* 05/11/2015   Urine Drug Screen:    Component Value Date/Time   LABOPIA NONE DETECTED 05/11/2015 1312   COCAINSCRNUR NONE DETECTED 05/11/2015 1312   LABBENZ NONE DETECTED 05/11/2015 1312   AMPHETMU NONE DETECTED 05/11/2015 1312   THCU NONE DETECTED 05/11/2015 1312   LABBARB NONE DETECTED 05/11/2015 1312      IMAGING I have personally reviewed the radiological images below and agree with the radiology interpretations.  Ct Head Wo Contrast 07/29/2015    Atrophy with extensive supratentorial small vessel disease, stable. No acute infarct evident. No hemorrhage or mass effect. Calcification proximal left middle cerebral artery is a stable finding. There are retention cysts in the right maxillary antrum.    Mri and Mra head and neck W Wo Contrast  07/30/2015  IMPRESSION: Acute infarction affecting the left basal ganglia and radiating white matter tracts. No swelling or hemorrhage.  Extensive chronic small vessel ischemic changes elsewhere throughout the brain.  No carotid bifurcation disease. 30-50% stenoses at both vertebral artery origins.  Severe atherosclerotic disease intracranially. Advanced disease in the siphon regions and supraclinoid internal carotid arteries with stenosis of the left supraclinoid internal carotid artery of  80% or greater. Stenosis on the right is 60-70%. Approximately 50% stenoses in the distal vertebral arteries and in the proximal basilar artery.  Diffuse atherosclerotic narrowing and irregularity in the smaller intracranial branches.    Dg Chest Port 1 View 07/29/2015    No active disease.   2D echo - pending  PHYSICAL EXAM  Temp:  [97.5 F (36.4 C)-99.3 F (37.4 C)] 99.3 F (37.4 C) (09/17 1300) Pulse Rate:  [64-78] 73 (09/17 1400) Resp:  [14-26] 23 (09/17 1400) BP: (121-193)/(46-122) 154/46 mmHg (09/17 1400) SpO2:  [91 %-100 %] 100 % (09/17 1400)  General - Well nourished, well developed,  in no apparent distress, sitting in chair.  Ophthalmologic - Fundi not visualized due to eye movement.  Cardiovascular - Regular rate and rhythm.  Mental Status -  Level of arousal and orientation to time, place, and person were intact. Language including expression, naming, repetition, comprehension was assessed and found intact. Fund of Knowledge was assessed and was impaired with knowing previous presidents.  Cranial Nerves II - XII - II - Visual field intact OU. III, IV, VI - Extraocular movements intact. V - Facial sensation intact bilaterally. VII - right facial droop. VIII - Hearing & vestibular intact bilaterally. X - Palate elevates symmetrically. XI - Chin turning & shoulder shrug intact bilaterally. XII - Tongue protrusion intact.  Motor Strength - The patient's strength was normal in all extremities and pronator drift was absent.  Bulk was normal and fasciculations were absent.   Motor Tone - Muscle tone was assessed at the neck and appendages and was normal.  Reflexes - The patient's reflexes were 1+ in all extremities and she had no pathological reflexes.  Sensory - Light touch, temperature/pinprick were assessed and were symmetrical.    Coordination - The patient had normal movements in the hands and feet with no ataxia or dysmetria.  Tremor was absent.  Gait and Station - deferred due to high BP during rounds.   ASSESSMENT/PLAN [name] is a 81 y.o. 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 She did receive IV TPA 77 mg at 1115 on 07/29/2015.  Stroke: left BG/CR small infarct, likely secondary to small vessel disease due to multiple stroke risk factors (HTN, HLD, DM, MRA diffuse athero). It feels less likely due to hypercoagulable state secondary to  advanced malignancy (breast cancer metastasis to multiple bones and soft tissue).   Resultant  Right facial droop  MRI  Left BG/CR small infarct  MRA head and neck Diffuse intracranial stenosis.   2D Echo  pending  LDL 114  HgbA1c pending  VTE prophylaxis - lovenox  Diet Carb Modified Fluid consistency:: Thin; Room service appropriate?: Yes  aspirin 81 mg orally every day prior to admission, now on dual antiplatelet for 3 months and then plavix alone due to diffuse intracranial athero.  Patient counseled to be compliant with her antithrombotic medications  Ongoing aggressive stroke risk factor management  Therapy recommendations: Pending   Disposition:  Pending   Hypertension  Blood pressure mildly high at times   Permissive hypertension (OK if < 220/120) but gradually normalize in 5-7 days  Hyperlipidemia  Home meds:  Zetia resumed in hospital  LDL 114, goal < 70  Resume zetia  Hx of statin allergy (feels crazy with lipitor)  Out pt follow up with PCP to consider PCSK9 inhibitors.  Diabetes  HgbA1c pending, goal < 7.0  Uncontrolled  CBG monitoring showed hyperglycemia  SSI  On lantus  DM education  Other Stroke Risk Factors  Advanced age  Breast cancer metastasis to bones and soft tissue - continue anastozole  Follow up with oncologist  Other Active Problems  hypokalemia  Hospital day # 1  This patient is critically ill due to left BG infarct s/p tPA and at significant risk of neurological worsening, death form hemorrhagic transformation, brain edema. This patient's care requires constant monitoring of vital signs, hemodynamics, respiratory and cardiac monitoring, review of multiple databases, neurological assessment, discussion with family, other specialists and medical decision making of high complexity. I spent 30 minutes of neurocritical care time in the care of this patient.  Rosalin Hawking, MD PhD Stroke Neurology 07/30/2015 2:49  PM   To contact Stroke Continuity provider, please refer to [email or web address] After hours, contact General Neurology

## 2015-07-30 NOTE — Consult Note (Signed)
WOC wound consult note Reason for Consult: Interdry evaluation  Wound type: Intertriginous dermatitis, mainly right groin and under pannus on the right side Pressure Ulcer POA: No Measurement: areas of redness with satellite lesions  Wound bed: intact but reddened  Drainage (amount, consistency, odor) none, but does have yeast like odor Periwound: intact  Dressing procedure/placement/frequency: Add Interdry Ag+ for moisture wicking and antifungal properties.  Discussed POC with patient and bedside nurse.  Re consult if needed, will not follow at this time. Thanks  Melody Kellogg, [place] [phone])

## 2015-07-30 NOTE — Progress Notes (Signed)
*  PRELIMINARY RESULTS* Echocardiogram 2d echo has been performed.  Leavy Cella 07/30/2015, 3:00 PM

## 2015-07-30 NOTE — Evaluation (Addendum)
Physical Therapy Evaluation Patient Details Name: Molly [name] MRN: [id number] DOB: 1923/03/16 Today's Date: 07/30/2015   History of Present Illness  81 yo female w/ PMHx of HTN, HLD, DM, MVP, CLL and metastatic breast CA presenting with slurred speech, facial droop, and right-sided weakness. TPA received. Neuro work up pending.  Clinical Impression  Patient demonstrates deficits in functional mobility as indicated below. Will need continued skilled PT to address deficits and maximize function. Will see as indicated and progress as tolerated. At this time, patient is able to ambulate with assist for stability, states daughter assists with some levels of self care. Anticipate patient will need HHPT upon discharge. If Daughter is unable to provide adaquate level of assist may need to consider post acute rehab.    Follow Up Recommendations Home health PT;Supervision/Assistance - 24 hour    Equipment Recommendations  None recommended by PT    Recommendations for Other Services       Precautions / Restrictions Precautions Precautions: Fall Restrictions Weight Bearing Restrictions: No      Mobility  Bed Mobility Overal bed mobility: Needs Assistance Bed Mobility: Rolling;Sidelying to Sit Rolling: Min guard Sidelying to sit: Min assist       General bed mobility comments: Min assist to elevate to standing  Transfers Overall transfer level: Needs assistance Equipment used: 1 person hand held assist Transfers: Sit to/from [facility] Sit to Stand: Min assist Stand pivot transfers: Min assist       General transfer comment: Min assist to power up to standing and HHA for stability during transitions and standing/pivoting to [facility]  Ambulation/Gait Ambulation/Gait assistance: Min assist Ambulation Distance (Feet): 60 Feet Assistive device: 1 person hand held assist Gait Pattern/deviations: Step-through pattern;Decreased stride length;Shuffle;Drifts  right/left;Trunk flexed Gait velocity: decreased Gait velocity interpretation: <1.8 ft/sec, indicative of risk for recurrent falls General Gait Details: very slow cadence, increased fear during mobility limiting ambulation. Assist required for stability, patient often reaching out with opposing hand for furniture  Stairs            Wheelchair Mobility    Modified Rankin (Stroke Patients Only) Modified Rankin (Stroke Patients Only) Pre-Morbid Rankin Score: No symptoms Modified Rankin: Moderately severe disability     Balance Overall balance assessment: Needs assistance   Sitting balance-Leahy Scale: Fair Sitting balance - Comments: sat EOB 5 minutes, BP assessed, dynamic balance causes modest posterior lean Postural control: Posterior lean Standing balance support: Single extremity supported Standing balance-Leahy Scale: Poor Standing balance comment: reliance on UE support for stability                             Pertinent Vitals/Pain Pain Assessment: No/denies pain    Home Living Family/patient expects to be discharged to:: Private residence Living Arrangements: Children Available Help at Discharge: Family;Available PRN/intermittently Type of Home: House Home Access: Stairs to enter     Home Layout: One level Home Equipment: Environmental consultant - 2 wheels;Bedside commode;Walker - 4 wheels      Prior Function Level of Independence: Independent with assistive device(s)         Comments: rollator walker for ambulation     Hand Dominance   Dominant Hand: Right    Extremity/Trunk Assessment   Upper Extremity Assessment: Overall WFL for tasks assessed           Lower Extremity Assessment: Generalized weakness (Strength appears symmetrical, hx of peripheral neuropathy)      Cervical / Trunk  Assessment:  (increased body habitus)  Communication   Communication: No difficulties  Cognition Arousal/Alertness: Awake/alert Behavior During Therapy: Flat  affect Overall Cognitive Status: Within Functional Limits for tasks assessed                      General Comments General comments (skin integrity, edema, etc.): BP assessed, in bed, 181systolic, sitting 115 systolic    Exercises        Assessment/Plan    PT Assessment Patient needs continued PT services  PT Diagnosis Difficulty walking;Abnormality of gait   PT Problem List Decreased strength;Decreased range of motion;Decreased activity tolerance;Decreased balance;Decreased mobility;Decreased coordination;Decreased safety awareness;Obesity  PT Treatment Interventions DME instruction;Gait training;Stair training;Functional mobility training;Therapeutic activities;Therapeutic exercise;Balance training;Patient/family education   PT Goals (Current goals can be found in the Care Plan section) Acute Rehab PT Goals Patient Stated Goal: to go home PT Goal Formulation: With patient/family Time For Goal Achievement: 08/13/15 Potential to Achieve Goals: Good    Frequency Min 4X/week   Barriers to discharge        Co-evaluation               End of Session Equipment Utilized During Treatment: Gait belt Activity Tolerance: No increased pain;Patient limited by fatigue Patient left: in chair;with call bell/phone within reach;with family/visitor present Nurse Communication: Mobility status         Time: 7262-0355 PT Time Calculation (min) (ACUTE ONLY): 24 min   Charges:   PT Evaluation $Initial PT Evaluation Tier I: 1 Procedure PT Treatments $Therapeutic Activity: 8-22 mins   PT G CodesDuncan Dull 08-01-15, 9:47 AM

## 2015-07-30 NOTE — Progress Notes (Signed)
PT Cancellation Note  Patient Details Name: Molly [name] MRN: [id number] DOB: 04/14/1923   Cancelled Treatment:    Reason Eval/Treat Not Completed: Patient not medically ready (remains on strict bedrest at this time) Will follow once activity orders updated.   Duncan Dull 07/30/2015, 7:34 AM Alben Deeds, PT DPT  [phone]

## 2015-07-31 DIAGNOSIS — I63032 Cerebral infarction due to thrombosis of left carotid artery: Secondary | ICD-10-CM

## 2015-07-31 DIAGNOSIS — N390 Urinary tract infection, site not specified: Secondary | ICD-10-CM

## 2015-07-31 LAB — BASIC METABOLIC PANEL
Anion gap: 10 (ref 5–15)
BUN: 6 mg/dL (ref 6–20)
CHLORIDE: 104 mmol/L (ref 101–111)
CO2: 25 mmol/L (ref 22–32)
CREATININE: 0.82 mg/dL (ref 0.44–1.00)
Calcium: 9.1 mg/dL (ref 8.9–10.3)
GFR calc Af Amer: >60 mL/min (ref >60)
GFR calc non Af Amer: >60 mL/min (ref >60)
GLUCOSE: 184 mg/dL — AB (ref 65–99)
POTASSIUM: 3 mmol/L — AB (ref 3.5–5.1)
SODIUM: 139 mmol/L (ref 135–145)

## 2015-07-31 LAB — GLUCOSE, CAPILLARY
GLUCOSE-CAPILLARY: 234 mg/dL — AB (ref 65–99)
GLUCOSE-CAPILLARY: 175 mg/dL — AB (ref 65–99)
Glucose-Capillary: 177 mg/dL — ABNORMAL HIGH (ref 65–99)

## 2015-07-31 LAB — CBC
HCT: 39.8 % (ref 36.0–46.0)
HEMOGLOBIN: 13.1 g/dL (ref 12.0–15.0)
MCH: 29.9 pg (ref 26.0–34.0)
MCHC: 32.9 g/dL (ref 30.0–36.0)
MCV: 90.9 fL (ref 78.0–100.0)
Platelets: 262 10*3/uL (ref 150–400)
RBC: 4.38 MIL/uL (ref 3.87–5.11)
RDW: 13.8 % (ref 11.5–15.5)
WBC: 25.2 10*3/uL — ABNORMAL HIGH (ref 4.0–10.5)

## 2015-07-31 MED ORDER — CIPROFLOXACIN HCL 250 MG PO TABS
250.0000 mg | ORAL_TABLET | Freq: Two times a day (BID) | ORAL | Status: DC
  Administered 2015-07-31 – 2015-08-01 (×3): 250 mg via ORAL
  Filled 2015-07-31 (×3): qty 1

## 2015-07-31 MED ORDER — CLONIDINE HCL 0.1 MG PO TABS
0.1000 mg | ORAL_TABLET | Freq: Two times a day (BID) | ORAL | Status: DC
  Administered 2015-07-31 – 2015-08-02 (×5): 0.1 mg via ORAL
  Filled 2015-07-31 (×5): qty 1

## 2015-07-31 MED ORDER — BISACODYL 10 MG RE SUPP
10.0000 mg | Freq: Every day | RECTAL | Status: DC | PRN
  Administered 2015-07-31 – 2015-08-02 (×2): 10 mg via RECTAL
  Filled 2015-07-31 (×3): qty 1

## 2015-07-31 MED ORDER — LISINOPRIL 2.5 MG PO TABS: 2.5000 mg | ORAL_TABLET | Freq: Every day | ORAL | Status: DC

## 2015-07-31 MED ORDER — IRBESARTAN 300 MG PO TABS
300.0000 mg | ORAL_TABLET | Freq: Every day | ORAL | Status: DC
  Administered 2015-07-31 – 2015-08-02 (×3): 300 mg via ORAL
  Filled 2015-07-31 (×3): qty 1

## 2015-07-31 MED ORDER — AMLODIPINE BESYLATE 10 MG PO TABS
10.0000 mg | ORAL_TABLET | Freq: Every day | ORAL | Status: DC
  Administered 2015-07-31 – 2015-08-02 (×3): 10 mg via ORAL
  Filled 2015-07-31 (×3): qty 1

## 2015-07-31 MED ORDER — ATENOLOL 25 MG PO TABS
50.0000 mg | ORAL_TABLET | Freq: Three times a day (TID) | ORAL | Status: DC
  Administered 2015-07-31 – 2015-08-02 (×7): 50 mg via ORAL
  Filled 2015-07-31 (×2): qty 2
  Filled 2015-07-31 (×2): qty 1
  Filled 2015-07-31 (×2): qty 2
  Filled 2015-07-31 (×3): qty 1

## 2015-07-31 NOTE — Progress Notes (Signed)
ANTIBIOTIC CONSULT NOTE - INITIAL  Pharmacy Consult for antimicrobial agent Indication: recurrent UTI  Allergies  Allergen Reactions  . Amlodipine Swelling    Currently taking exforge, separate med-Amlodipiine caused lower leg edema  . Contrast Media [Iodinated Diagnostic Agents] Nausea Only and Other (See Comments)    Patient experienced chills, nausea, hypothermic symptoms, also weird feelings  . Hydralazine Other (See Comments)    Severe hypotension-suddenly and drastically dropped BP  . Metoprolol Other (See Comments)    Severe hypotension-suddenly and drastically dropped BP, same as hydralazine  . Codeine Other (See Comments)    Makes her feel "crazy."  . Demerol Other (See Comments)    Makes her feel "crazy."  . Librium Other (See Comments)    Makes her feel "crazy."  . Lipitor [Atorvastatin Calcium] Other (See Comments)    Makes her feel "crazy."    Patient Measurements: Height: 5\' 6"  (167.6 cm) Weight: 189 lb 6 oz (85.9 kg) IBW/kg (Calculated) : 59.3 Adjusted Body Weight:   Vital Signs: Temp: 97.7 F (36.5 C) (09/18 0800) Temp Source: Oral (09/18 0800) BP: 203/56 mmHg (09/18 0800) Pulse Rate: 71 (09/18 0800) Intake/Output from previous day: 09/17 0701 - 09/18 0700 In: 590 [P.O.:440; I.V.:150] Out: 1125 [Urine:1125] Intake/Output from this shift: Total I/O In: -  Out: 200 [Urine:200]  Labs:  Recent Labs  07/29/15 1059 07/29/15 1105 07/30/15 0239 07/31/15 0250  WBC 23.8*  --  25.9* 25.2*  HGB 14.3 15.6* 12.2 13.1  PLT 292  --  302 262  CREATININE 0.87 0.70 0.83 0.82   Estimated Creatinine Clearance: 51.3 mL/min (by C-G formula based on Cr of 0.82). No results for input(s): VANCOTROUGH, VANCOPEAK, VANCORANDOM, GENTTROUGH, GENTPEAK, GENTRANDOM, TOBRATROUGH, TOBRAPEAK, TOBRARND, AMIKACINPEAK, AMIKACINTROU, AMIKACIN in the last 72 hours.   Medical History: Past Medical History  Diagnosis Date  . Hypertension   . MVP (mitral valve prolapse)   .  Hypothyroidism   . Anemia   . Hypercholesterolemia   . Heart murmur   . Type II diabetes mellitus   . Arthritis     "back; knees, hands" (05/11/2015)  . Breast cancer, right breast     "cells went into the blood and caused her hip to break" (05/11/2015)    Medications:  See EMR  Assessment: 81 yo female with history of breast cancer, admitted with acute CVA, she received tPA on 9/16. Patient has recurrent UTIs.  Historically she has grown morganella, e coli, klebsiella and pseudomonas.  Due to historical infections, I will start an anti-pseudomonal agent and will plan to re-assess therapy based on culture results.    Goal of Therapy:  Treatment and prevention of UTI   Plan:  -Ciprofloxacin 250 mg po bid -F/u speciation and sensitivities of urine culture   Hughes Better, PharmD, BCPS Clinical Pharmacist Pager: [phone] 07/31/2015 10:49 AM

## 2015-07-31 NOTE — Progress Notes (Signed)
STROKE TEAM PROGRESS NOTE  HPI Molly [name] is an 81 y.o. female who was last seen normal by her daughter at 45 . She went to take a nap and awoke with right sided weakness. EMS was called and noted right sided facial droop and weakness. EMS brought her to hospital as code stroke. CT obtained and showed no acute findings. Patient was still within tPA window and tPA was administered. NIH stroke score was 7. Blood pressure was elevated requiring intervention with IV hydralazine.  Date last known well: Date: 07/29/2015 Time last known well: Time: 07:10 tPA Given: Yes Modified Rankin: Rankin Score=0   SUBJECTIVE (INTERVAL HISTORY) Her daughter is at the bedside.  Overall she feels her condition is rapidly improving. Her right arm strength is much better but still has right facial droop. She has hx of breast cancer with extensive bone metastasis. Still on anastrozole. Blood pressure is adequately controlled. UA is suggestive of UTI and patient apparently has history of recurrent UTI and has been on anti-products   OBJECTIVE Temp:  [97.7 F (36.5 C)-99.3 F (37.4 C)] 97.7 F (36.5 C) (09/18 0800) Pulse Rate:  [57-82] 71 (09/18 0800) Cardiac Rhythm:  [-] Normal sinus rhythm;Bundle branch block (09/18 0800) Resp:  [12-24] 24 (09/18 0800) BP: (146-211)/(43-69) 203/56 mmHg (09/18 0800) SpO2:  [92 %-100 %] 98 % (09/18 0800)  CBC:  Recent Labs Lab 07/29/15 1059  07/30/15 0239 07/31/15 0250  WBC 23.8*  --  25.9* 25.2*  NEUTROABS 11.5*  --   --   --   HGB 14.3  < > 12.2 13.1  HCT 42.6  < > 37.1 39.8  MCV 89.9  --  90.5 90.9  PLT 292  --  302 262  < > = values in this interval not displayed.  Basic Metabolic Panel:   Recent Labs Lab 07/30/15 0239 07/31/15 0250  NA 135 139  K 2.8* 3.0*  CL 103 104  CO2 24 25  GLUCOSE 216* 184*  BUN 9 6  CREATININE 0.83 0.82  CALCIUM 8.7* 9.1    Lipid Panel:     Component Value Date/Time   CHOL 186 07/30/2015 0239   TRIG 182*  07/30/2015 0239   HDL 36* 07/30/2015 0239   CHOLHDL 5.2 07/30/2015 0239   VLDL 36 07/30/2015 0239   LDLCALC 114* 07/30/2015 0239   HgbA1c:  Lab Results  Component Value Date   HGBA1C 9.0* 05/11/2015   Urine Drug Screen:     Component Value Date/Time   LABOPIA NONE DETECTED 05/11/2015 1312   COCAINSCRNUR NONE DETECTED 05/11/2015 1312   LABBENZ NONE DETECTED 05/11/2015 1312   AMPHETMU NONE DETECTED 05/11/2015 1312   THCU NONE DETECTED 05/11/2015 1312   LABBARB NONE DETECTED 05/11/2015 1312      IMAGING I have personally reviewed the radiological images below and agree with the radiology interpretations.  Ct Head Wo Contrast 07/29/2015    Atrophy with extensive supratentorial small vessel disease, stable. No acute infarct evident. No hemorrhage or mass effect. Calcification proximal left middle cerebral artery is a stable finding. There are retention cysts in the right maxillary antrum.    Mri and Mra head and neck W Wo Contrast  07/30/2015  IMPRESSION: Acute infarction affecting the left basal ganglia and radiating white matter tracts. No swelling or hemorrhage.  Extensive chronic small vessel ischemic changes elsewhere throughout the brain.  No carotid bifurcation disease. 30-50% stenoses at both vertebral artery origins.  Severe atherosclerotic disease intracranially. Advanced disease in the  siphon regions and supraclinoid internal carotid arteries with stenosis of the left supraclinoid internal carotid artery of 80% or greater. Stenosis on the right is 60-70%. Approximately 50% stenoses in the distal vertebral arteries and in the proximal basilar artery.  Diffuse atherosclerotic narrowing and irregularity in the smaller intracranial branches.    Dg Chest Port 1 View 07/29/2015    No active disease.   2D echo - pending  PHYSICAL EXAM  Temp:  [97.7 F (36.5 C)-99.3 F (37.4 C)] 97.7 F (36.5 C) (09/18 0800) Pulse Rate:  [57-82] 71 (09/18 0800) Resp:  [12-24] 24 (09/18  0800) BP: (146-211)/(43-69) 203/56 mmHg (09/18 0800) SpO2:  [92 %-100 %] 98 % (09/18 0800)  General - Well nourished, well developed, in no apparent distress, sitting in chair.  Ophthalmologic - Fundi not visualized due to eye movement.  Cardiovascular - Regular rate and rhythm.  Mental Status -  Level of arousal and orientation to time, place, and person were intact. Language including expression, naming, repetition, comprehension was assessed and found intact. Fund of Knowledge was assessed and was impaired with knowing previous presidents.  Cranial Nerves II - XII - II - Visual field intact OU. III, IV, VI - Extraocular movements intact. V - Facial sensation intact bilaterally. VII - right facial droop. VIII - Hearing & vestibular intact bilaterally. X - Palate elevates symmetrically. XI - Chin turning & shoulder shrug intact bilaterally. XII - Tongue protrusion intact.  Motor Strength - The patient's strength was normal in all extremities and pronator drift was absent.  Bulk was normal and fasciculations were absent.   Motor Tone - Muscle tone was assessed at the neck and appendages and was normal.  Reflexes - The patient's reflexes were 1+ in all extremities and she had no pathological reflexes.  Sensory - Light touch, temperature/pinprick were assessed and were symmetrical.    Coordination - The patient had normal movements in the hands and feet with no ataxia or dysmetria.  Tremor was absent.  Gait and Station - deferred due to high BP during rounds.   ASSESSMENT/PLAN [name] is a 81 y.o. 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 She did receive IV TPA 77 mg at 1115 on 07/29/2015.  Stroke: left BG/CR small infarct, likely secondary to small vessel disease  due to multiple stroke risk factors (HTN, HLD, DM, MRA diffuse athero). It feels less likely due to hypercoagulable state secondary to advanced malignancy (breast cancer metastasis to multiple bones and soft tissue).   Resultant  Right facial droop  MRI  Left BG/CR small infarct  MRA head and neck Diffuse intracranial stenosis.   2D Echo  There was moderate concentric hypertrophy. Systolic function was vigorous. The estimated ejection fraction was in the range of 65% to 70%. Wall motion was normal; there were no regional wall motion abnormalities  LDL 114  HgbA1c pending  VTE prophylaxis - lovenox Diet Carb Modified Fluid consistency:: Thin; Room service appropriate?: Yes  aspirin 81 mg orally every day prior to admission, now on dual antiplatelet for 3 months and then plavix alone due to diffuse intracranial athero.  Patient counseled to be compliant with her antithrombotic medications  Ongoing aggressive stroke risk factor management  Therapy recommendations: Pending   Disposition:  Pending   Hypertension  Blood pressure mildly high at times   Permissive hypertension (OK if <  220/120) but gradually normalize in 5-7 days  Hyperlipidemia  Home meds:  Zetia resumed in hospital  LDL 114, goal < 70  Resume zetia  Hx of statin allergy (feels crazy with lipitor)  Out pt follow up with PCP to consider PCSK9 inhibitors.  Diabetes  HgbA1c pending, goal < 7.0  Uncontrolled  CBG monitoring showed hyperglycemia  SSI  On lantus  DM education  Other Stroke Risk Factors  Advanced age  Breast cancer metastasis to bones and soft tissue - continue anastozole  Follow up with oncologist  Other Active Problems  hypokalemia UTI- recurrent h/o multiple courses of antibiotics Constipation- chropnic problem  Hospital day # 2 Plan to transfer the patient to the neurology floor today. Mobilize out of bed. Therapy consults. Start aspirin and Plavix for  secondary stroke prevention given bilateral cavernous carotid stenosis which may be symptomatic for 3 months. Given prior history of GI bleed she may be at risk for bleed. I have discussed this risk-benefit with the patient and daughter and answered questions. We will consult pharmacy for anti-cardiac management for her recurrent UTI and augment her blood pressure medicines by adding clonidine   Antony Contras, MD Stroke Neurology 07/31/2015 10:45 AM   To contact Stroke Continuity provider, please refer to [email or web address] After hours, contact General Neurology

## 2015-08-01 DIAGNOSIS — I638 Other cerebral infarction: Secondary | ICD-10-CM

## 2015-08-01 LAB — PATHOLOGIST SMEAR REVIEW: Path Review: ABNORMAL

## 2015-08-01 LAB — CBC
HCT: 37.5 % (ref 36.0–46.0)
Hemoglobin: 12 g/dL (ref 12.0–15.0)
MCH: 29 pg (ref 26.0–34.0)
MCHC: 32 g/dL (ref 30.0–36.0)
MCV: 90.6 fL (ref 78.0–100.0)
PLATELETS: 291 10*3/uL (ref 150–400)
RBC: 4.14 MIL/uL (ref 3.87–5.11)
RDW: 13.9 % (ref 11.5–15.5)
WBC: 25.1 10*3/uL — AB (ref 4.0–10.5)

## 2015-08-01 LAB — GLUCOSE, CAPILLARY
GLUCOSE-CAPILLARY: 158 mg/dL — AB (ref 65–99)
GLUCOSE-CAPILLARY: 147 mg/dL — AB (ref 65–99)
GLUCOSE-CAPILLARY: 174 mg/dL — AB (ref 65–99)
Glucose-Capillary: 175 mg/dL — ABNORMAL HIGH (ref 65–99)
Glucose-Capillary: 243 mg/dL — ABNORMAL HIGH (ref 65–99)

## 2015-08-01 LAB — URINE CULTURE: Culture: >=100000

## 2015-08-01 LAB — BASIC METABOLIC PANEL
Anion gap: 8 (ref 5–15)
BUN: 9 mg/dL (ref 6–20)
CALCIUM: 8.8 mg/dL — AB (ref 8.9–10.3)
CO2: 25 mmol/L (ref 22–32)
CREATININE: 1.02 mg/dL — AB (ref 0.44–1.00)
Chloride: 103 mmol/L (ref 101–111)
GFR calc Af Amer: 55 mL/min — ABNORMAL LOW (ref >60)
GFR, EST NON AFRICAN AMERICAN: 47 mL/min — AB (ref >60)
Glucose, Bld: 152 mg/dL — ABNORMAL HIGH (ref 65–99)
POTASSIUM: 3 mmol/L — AB (ref 3.5–5.1)
SODIUM: 136 mmol/L (ref 135–145)

## 2015-08-01 LAB — HEMOGLOBIN A1C
Hgb A1c MFr Bld: 7.8 % — ABNORMAL HIGH (ref 4.8–5.6)
Mean Plasma Glucose: 177 mg/dL

## 2015-08-01 MED ORDER — CEPHALEXIN 500 MG PO CAPS
500.0000 mg | ORAL_CAPSULE | Freq: Two times a day (BID) | ORAL | Status: DC
  Administered 2015-08-01 – 2015-08-02 (×3): 500 mg via ORAL
  Filled 2015-08-01 (×3): qty 1

## 2015-08-01 NOTE — Progress Notes (Signed)
Physical Therapy Treatment Patient Details Name: Molly [name] MRN: [id number] DOB: [birth year] Today's Date: 08/01/2015    History of Present Illness 81 yo female w/ PMHx of HTN, HLD, DM, MVP, CLL and metastatic breast CA presenting with slurred speech, facial droop, and right-sided weakness. TPA received. Neuro work up pending.    PT Comments    Patient progressing well with mobility.  Sat and discussed plans for daily routine with patient and daughter to ensure safety.  Voiced concerns for new medications and daughter reports quickly she will be taking off work the rest of the week to stay with her mom if d/c home today or tomorrow.  Feel patient safe for d/c home with initial 24 hour assist with HHPT.  Follow Up Recommendations  Home health PT;Supervision/Assistance - 24 hour     Equipment Recommendations  None recommended by PT    Recommendations for Other Services       Precautions / Restrictions Precautions Precautions: Fall Restrictions Weight Bearing Restrictions: No    Mobility  Bed Mobility Overal bed mobility: Needs Assistance Bed Mobility: Supine to Sit     Supine to sit: Supervision     General bed mobility comments: increased time, assist only to move covers off her feet  Transfers Overall transfer level: Needs assistance Equipment used: 4-wheeled walker Transfers: Sit to/from Stand Sit to Stand: Supervision Stand pivot transfers: Min guard       General transfer comment: pulls up on rollator and returns to sitting on edge of bed, then pushes up with hand from bed  Ambulation/Gait Ambulation/Gait assistance: Supervision Ambulation Distance (Feet): 90 Feet Assistive device: 4-wheeled walker Gait Pattern/deviations: Trunk flexed;Step-through pattern;Decreased stride length     General Gait Details: feels more confident with rollator per her report, but reports sometimes letting go at home; chronically flexed spine   Stairs             Wheelchair Mobility    Modified Rankin (Stroke Patients Only)       Balance     Sitting balance-Leahy Scale: Good Sitting balance - Comments: no LOB with LB bathing/dressing in sitting     Standing balance-Leahy Scale: Fair                      Cognition Arousal/Alertness: Awake/alert Behavior During Therapy: WFL for tasks assessed/performed Overall Cognitive Status: Within Functional Limits for tasks assessed                      Exercises      General Comments        Pertinent Vitals/Pain Pain Assessment: No/denies pain    Home Living Family/patient expects to be discharged to:: Private residence Living Arrangements: Children Available Help at Discharge: Family;Available PRN/intermittently (daughter works) Type of Home: House Home Access: Stairs to enter   Home Layout: One level Home Equipment: Environmental consultant - 2 wheels;Bedside commode;Walker - 4 wheels      Prior Function Level of Independence: Needs assistance  Gait / Transfers Assistance Needed: ambulates with 4 [employer] ADL's / Homemaking Assistance Needed: sponge bathes and dresses herself, toilets using 3 in 1, daughter does all housekeeping and cooking, pt can warm meals     PT Goals (current goals can now be found in the care plan section) Acute Rehab PT Goals Patient Stated Goal: to go home Progress towards PT goals: Progressing toward goals    Frequency  Min 4X/week    PT Plan Current plan remains  appropriate    Co-evaluation             End of Session Equipment Utilized During Treatment: Gait belt Activity Tolerance: Patient tolerated treatment well Patient left: in chair;with call bell/phone within reach     Time: 1130-1156 PT Time Calculation (min) (ACUTE ONLY): 26 min  Charges:  $Gait Training: 8-22 mins $Therapeutic Activity: 8-22 mins                    G Codes:      Molly [name],Molly [name] 08-27-2015, 1:05 PM  [place], [place] Aug 27, 2015

## 2015-08-01 NOTE — Evaluation (Signed)
Occupational Therapy Evaluation Patient Details Name: Molly [name] MRN: [id number] DOB: [birth year] Today's Date: 08/01/2015    History of Present Illness 81 yo female w/ PMHx of HTN, HLD, DM, MVP, CLL and metastatic breast CA presenting with slurred speech, facial droop, and right-sided weakness. TPA received. Neuro work up pending.   Clinical Impression   Prior to admission, pt ambulated with a 4WW and used a BSC for toileting. She sponged bathed independently and could warm a prepared meal in the microwave.  She was dependent on her daughter for meal prep, housekeeping and for medication administration. Pt's lifestyle was generally sedentary. Pt presents with decreased R UE coordination and strength.  She reports some baseline strength issues due to hx of breast cancer surgery. She demonstrates generalized weakness and impaired balance.  Pt was able to sponge bathe and dress this session in set up to min guard assist.  Recommending HHOT with 24 hour care initially if daughter is able.  May require post acute rehab if she is not able to commit.    Follow Up Recommendations  Home health OT;Supervision/Assistance - 24 hour    Equipment Recommendations  None recommended by OT    Recommendations for Other Services       Precautions / Restrictions Precautions Precautions: Fall Restrictions Weight Bearing Restrictions: No      Mobility Bed Mobility Overal bed mobility: Needs Assistance Bed Mobility: Supine to Sit     Supine to sit: Supervision     General bed mobility comments: no physical assist, used rail  Transfers Overall transfer level: Needs assistance Equipment used: 1 person hand held assist Transfers: Stand Pivot Transfers;Sit to/from Stand Sit to Stand: Min guard Stand pivot transfers: Min guard       General transfer comment: did not require physical assist to stand from bed or 3 in 1.    Balance     Sitting balance-Leahy Scale: Good Sitting balance  - Comments: no LOB with LB bathing/dressing in sitting     Standing balance-Leahy Scale: Poor                              ADL Overall ADL's : Needs assistance/impaired Eating/Feeding: Set up;Sitting Eating/Feeding Details (indicate cue type and reason): reports needing assist to open packages and some use of L as lead Grooming: Wash/dry hands;Wash/dry face;Sitting;Set up   Upper Body Bathing: Set up;Sitting   Lower Body Bathing: Min guard;Sit to/from stand   Upper Body Dressing : Set up;Sitting   Lower Body Dressing: Min guard;Sit to/from stand Lower Body Dressing Details (indicate cue type and reason): dons her R sock crossing foot over opposite knee, bends forward to reach L foot Toilet Transfer: Min guard;Stand-pivot;BSC   Toileting- Clothing Manipulation and Hygiene: Min guard;Sit to/from stand         General ADL Comments: Pt's daughter did not confirm being able to stay with pt 24 hours even initially.     Vision     Perception     Praxis      Pertinent Vitals/Pain Pain Assessment: No/denies pain     Hand Dominance Right   Extremity/Trunk Assessment Upper Extremity Assessment Upper Extremity Assessment: RUE deficits/detail RUE Deficits / Details: 4-/5 shoulder and elbow extension, 4/5 all other areas (pt with hx of breast cancer, states she has baseline weaknes) RUE Coordination: decreased fine motor;decreased gross motor   Lower Extremity Assessment Lower Extremity Assessment: Defer to PT evaluation  Communication Communication Communication: Expressive difficulties (difficulty with pronunication x 1, otherwise no difficulties)   Cognition Arousal/Alertness: Awake/alert Behavior During Therapy: WFL for tasks assessed/performed Overall Cognitive Status: Within Functional Limits for tasks assessed                     General Comments       Exercises       Shoulder Instructions      Home Living Family/patient expects  to be discharged to:: Private residence Living Arrangements: Children Available Help at Discharge: Family;Available PRN/intermittently (daughter works) Type of Home: House Home Access: Stairs to enter     Home Layout: One level     Bathroom Shower/Tub: Tub/shower unit;Curtain (pt does not use, prefers to sponge bathe seated on her 3 in1)   Bathroom Toilet: Standard (uses her 3 in 1 exclusively) Bathroom Accessibility: Yes How Accessible: Accessible via walker Home Equipment: [place] - 2 wheels;Bedside commode;Walker - 4 wheels          Prior Functioning/Environment Level of Independence: Needs assistance  Gait / Transfers Assistance Needed: ambulates with 4 [employer] ADL's / Homemaking Assistance Needed: sponge bathes and dresses herself, toilets using 3 in 1, daughter does all housekeeping and cooking, pt can warm meals        OT Diagnosis: Generalized weakness;Hemiplegia dominant side   OT Problem List: Decreased strength;Impaired balance (sitting and/or standing);Decreased knowledge of use of DME or AE;Impaired UE functional use;Decreased coordination;Decreased activity tolerance   OT Treatment/Interventions: Self-care/ADL training;Therapeutic activities;Patient/family education;Balance training;DME and/or AE instruction;Neuromuscular education    OT Goals(Current goals can be found in the care plan section) Acute Rehab OT Goals Patient Stated Goal: to go home OT Goal Formulation: With patient Time For Goal Achievement: 08/08/15 Potential to Achieve Goals: Good ADL Goals Pt Will Perform Eating: Independently;sitting (with R hand lead 100% of time) Pt Will Perform Grooming: with modified independence;standing Pt Will Perform Lower Body Bathing: with modified independence;sit to/from stand Pt Will Perform Lower Body Dressing: with modified independence;sit to/from stand Pt Will Transfer to Toilet: with modified independence;ambulating;bedside commode Pt Will Perform Toileting -  Clothing Manipulation and hygiene: with modified independence;sit to/from stand Pt/caregiver will Perform Home Exercise Program: Increased strength;Right Upper extremity;With written HEP provided;Independently (strength and coordination activities)  OT Frequency: Min 3X/week   Barriers to D/C:            Co-evaluation              End of Session    Activity Tolerance: Patient tolerated treatment well Patient left: in chair;with call bell/phone within reach;with family/visitor present   Time: 5631-4970 OT Time Calculation (min): 29 min Charges:  OT General Charges $OT Visit: 1 Procedure OT Evaluation $Initial OT Evaluation Tier I: 1 Procedure OT Treatments $Self Care/Home Management : 8-22 mins G-Codes:    Malka So 08/01/2015, 10:59 AM  [phone]

## 2015-08-01 NOTE — Care Management Important Message (Signed)
Important Message  Patient Details  Name: Molly [name] MRN: [id number] Date of Birth: 03/25/1923   Medicare Important Message Given:  Yes-second notification given    Nathen May 08/01/2015, 4:05 PM

## 2015-08-01 NOTE — Progress Notes (Signed)
STROKE TEAM PROGRESS NOTE  HPI Molly [name] is an 81 y.o. female who was last seen normal by her daughter at 74 . She went to take a nap and awoke with right sided weakness. EMS was called and noted right sided facial droop and weakness. EMS brought her to hospital as code stroke. CT obtained and showed no acute findings. Patient was still within tPA window and tPA was administered. NIH stroke score was 7. Blood pressure was elevated requiring intervention with IV hydralazine.  Date last known well: Date: 07/29/2015 Time last known well: Time: 07:10 tPA Given: Yes Modified Rankin: Rankin Score=0   SUBJECTIVE (INTERVAL HISTORY) Her daughter is not at the bedside.  Overall she feels her condition is rapidly improving. Her right arm strength is much better but still has right facial droop. She is awaiting therapy evaluation prior to likely discharge   OBJECTIVE Temp:  [97.3 F (36.3 C)-98.1 F (36.7 C)] 98.1 F (36.7 C) (09/19 1053) Pulse Rate:  [40-84] 57 (09/19 1053) Cardiac Rhythm:  [-] Sinus bradycardia (09/19 1216) Resp:  [14-23] 18 (09/19 1053) BP: (122-182)/(32-106) 182/52 mmHg (09/19 1053) SpO2:  [93 %-100 %] 100 % (09/19 1053)  CBC:  Recent Labs Lab 07/29/15 1059  07/31/15 0250 08/01/15 0201  WBC 23.8*  < > 25.2* 25.1*  NEUTROABS 11.5*  --   --   --   HGB 14.3  < > 13.1 12.0  HCT 42.6  < > 39.8 37.5  MCV 89.9  < > 90.9 90.6  PLT 292  < > 262 291  < > = values in this interval not displayed.  Basic Metabolic Panel:   Recent Labs Lab 07/31/15 0250 08/01/15 0201  NA 139 136  K 3.0* 3.0*  CL 104 103  CO2 25 25  GLUCOSE 184* 152*  BUN 6 9  CREATININE 0.82 1.02*  CALCIUM 9.1 8.8*    Lipid Panel:     Component Value Date/Time   CHOL 186 07/30/2015 0239   TRIG 182* 07/30/2015 0239   HDL 36* 07/30/2015 0239   CHOLHDL 5.2 07/30/2015 0239   VLDL 36 07/30/2015 0239   LDLCALC 114* 07/30/2015 0239   HgbA1c:  Lab Results  Component Value Date   HGBA1C 7.8* 07/30/2015   Urine Drug Screen:     Component Value Date/Time   LABOPIA NONE DETECTED 05/11/2015 1312   COCAINSCRNUR NONE DETECTED 05/11/2015 1312   LABBENZ NONE DETECTED 05/11/2015 1312   AMPHETMU NONE DETECTED 05/11/2015 1312   THCU NONE DETECTED 05/11/2015 1312   LABBARB NONE DETECTED 05/11/2015 1312      IMAGING I have personally reviewed the radiological images below and agree with the radiology interpretations.  Ct Head Wo Contrast 07/29/2015    Atrophy with extensive supratentorial small vessel disease, stable. No acute infarct evident. No hemorrhage or mass effect. Calcification proximal left middle cerebral artery is a stable finding. There are retention cysts in the right maxillary antrum.    Mri and Mra head and neck W Wo Contrast  07/30/2015  IMPRESSION: Acute infarction affecting the left basal ganglia and radiating white matter tracts. No swelling or hemorrhage.  Extensive chronic small vessel ischemic changes elsewhere throughout the brain.  No carotid bifurcation disease. 30-50% stenoses at both vertebral artery origins.  Severe atherosclerotic disease intracranially. Advanced disease in the siphon regions and supraclinoid internal carotid arteries with stenosis of the left supraclinoid internal carotid artery of 80% or greater. Stenosis on the right is 60-70%. Approximately 50% stenoses in  the distal vertebral arteries and in the proximal basilar artery.  Diffuse atherosclerotic narrowing and irregularity in the smaller intracranial branches.    Dg Chest Port 1 View 07/29/2015    No active disease.   2D echo - Left ventricle: The cavity size was normal. There was moderate concentric hypertrophy. Systolic function was vigorous. The estimated ejection fraction was in the range of 65% to 70%. Wall motion was normal; there were no regional wall motion abnormalities. PHYSICAL EXAM  Temp:  [97.3 F (36.3 C)-98.1 F (36.7 C)] 98.1 F (36.7 C) (09/19  1053) Pulse Rate:  [40-84] 57 (09/19 1053) Resp:  [14-23] 18 (09/19 1053) BP: (122-182)/(32-106) 182/52 mmHg (09/19 1053) SpO2:  [93 %-100 %] 100 % (09/19 1053)  General - Well nourished, well developed, in no apparent distress, sitting in chair.  Ophthalmologic - Fundi not visualized due to eye movement.  Cardiovascular - Regular rate and rhythm.  Mental Status -  Level of arousal and orientation to time, place, and person were intact. Language including expression, naming, repetition, comprehension was assessed and found intact. Fund of Knowledge was mildy impaired  Cranial Nerves II - XII - II - Visual field intact OU. III, IV, VI - Extraocular movements intact. V - Facial sensation intact bilaterally. VII - mild right facial droop. VIII - Hearing & vestibular intact bilaterally. X - Palate elevates symmetrically. XI - Chin turning & shoulder shrug intact bilaterally. XII - Tongue protrusion intact.  Motor Strength - The patient's strength was normal in all extremities and pronator drift was absent.  Bulk was normal and fasciculations were absent.   Motor Tone - Muscle tone was assessed at the neck and appendages and was normal.  Reflexes - The patient's reflexes were 1+ in all extremities and she had no pathological reflexes.  Sensory - Light touch, temperature/pinprick were assessed and were symmetrical.    Coordination - The patient had normal movements in the hands and feet with no ataxia or dysmetria.  Tremor was absent.  Gait and Station - deferred due to high BP during rounds.   ASSESSMENT/PLAN [name] is a 81 y.o. 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 She did receive IV TPA 77 mg at 1115 on 07/29/2015.   Stroke: left BG/CR small infarct, likely  secondary to small vessel disease due to multiple stroke risk factors (HTN, HLD, DM, MRA diffuse athero).    Resultant  Right facial droop  MRI  Left BG/CR small infarct  MRA head and neck Diffuse intracranial stenosis.   2D Echo  There was moderate concentric hypertrophy. Systolic function was vigorous. The estimated ejection fraction was in the range of 65% to 70%. Wall motion was normal; there were no regional wall motion abnormalities  LDL 114  HgbA1c 7.8  VTE prophylaxis - lovenox Diet Carb Modified Fluid consistency:: Thin; Room service appropriate?: Yes  aspirin 81 mg orally every day prior to admission, now on dual antiplatelet for 3 months and then plavix alone due to diffuse intracranial athero.  Patient counseled to be compliant with her antithrombotic medications  Ongoing aggressive stroke risk factor management  Therapy recommendations: Pending   Disposition:  Pending   Hypertension  Blood pressure mildly high at times   Permissive hypertension (OK if < 220/120) but gradually normalize in 5-7 days  Hyperlipidemia  Home meds:  Zetia resumed in  hospital  LDL 114, goal < 70  Resume zetia  Hx of statin allergy (feels crazy with lipitor)  Out pt follow up with PCP to consider PCSK9 inhibitors.  Diabetes  HgbA1c 7.8 goal < 7.0  Uncontrolled  CBG monitoring showed hyperglycemia  SSI  On lantus  DM education  Other Stroke Risk Factors  Advanced age  Breast cancer metastasis to bones and soft tissue - continue anastozole  Follow up with oncologist  Other Active Problems  hypokalemia UTI- recurrent h/o multiple courses of antibiotics Constipation- chronic problem  Hospital day # 3 Plan to transfer the patient to the neurology floor today. Mobilize out of bed. Therapy consults.continue aspirin and Plavix for secondary stroke prevention given bilateral cavernous carotid stenosis which may be symptomatic for 3 months. Given prior  history of GI bleed she may be at risk for bleed. I have discussed this risk-benefit with the patient and daughter on 07/31/15 and answered questions.    Antony Contras, MD Stroke Neurology 08/01/2015 1:41 PM   To contact Stroke Continuity provider, please refer to [email or web address] After hours, contact General Neurology

## 2015-08-01 NOTE — Progress Notes (Signed)
Per patient request she wants all 4 rails up on bed because she states "I like to rest my legs on them." [place] Silos, April E, RN 08/01/2015 9:22 PM

## 2015-08-02 DIAGNOSIS — I639 Cerebral infarction, unspecified: Principal | ICD-10-CM

## 2015-08-02 LAB — GLUCOSE, CAPILLARY
Glucose-Capillary: 164 mg/dL — ABNORMAL HIGH (ref 65–99)
Glucose-Capillary: 214 mg/dL — ABNORMAL HIGH (ref 65–99)
Glucose-Capillary: 123 mg/dL — ABNORMAL HIGH (ref 65–99)

## 2015-08-02 MED ORDER — POTASSIUM CHLORIDE CRYS ER 20 MEQ PO TBCR
20.0000 meq | EXTENDED_RELEASE_TABLET | Freq: Two times a day (BID) | ORAL | Status: DC
  Administered 2015-08-02: 20 meq via ORAL
  Filled 2015-08-02: qty 1

## 2015-08-02 MED ORDER — POTASSIUM CHLORIDE CRYS ER 10 MEQ PO TBCR: 10.0000 meq | EXTENDED_RELEASE_TABLET | Freq: Every day | ORAL | Status: DC

## 2015-08-02 MED ORDER — CEPHALEXIN 500 MG PO CAPS: 500.0000 mg | ORAL_CAPSULE | Freq: Two times a day (BID) | ORAL | Status: DC

## 2015-08-02 MED ORDER — CLOPIDOGREL BISULFATE 75 MG PO TABS: 75.0000 mg | ORAL_TABLET | Freq: Every day | ORAL | Status: DC

## 2015-08-02 MED ORDER — CLONIDINE HCL 0.1 MG PO TABS: 0.1000 mg | ORAL_TABLET | Freq: Two times a day (BID) | ORAL | Status: DC

## 2015-08-02 NOTE — Progress Notes (Signed)
Pt discharged home with daughter. Left floor via wheelchair at 1749. Discharge instructions given and IV d/c'd. Wendee Copp

## 2015-08-02 NOTE — Evaluation (Signed)
Speech Language Pathology Evaluation Patient Details Name: QUINCEY NORED MRN: [id number] DOB: [birth year] Today's Date: 08/02/2015 Time: 7846-9629 SLP Time Calculation (min) (ACUTE ONLY): 17 min  Problem List:  Patient Active Problem List   Diagnosis Date Noted  . CVA (cerebral infarction) 07/29/2015  . Abdominal pain, acute 06/19/2015  . Lactic acidosis 06/19/2015  . CLL (chronic lymphocytic leukemia) 06/19/2015  . Abdominal pain 06/19/2015  . Left flank pain 06/19/2015  . UTI (lower urinary tract infection) 05/11/2015  . Dehydration with hyponatremia 05/11/2015  . Hypothyroidism 05/11/2015  . Aortic stenosis, mild 09/07/2014  . Diverticulosis of colon without hemorrhage 07/11/2014  . Colon polyps 07/10/2014  . GI bleed 07/07/2014  . Syncope 07/07/2014  . HTN (hypertension) 07/07/2014  . NSTEMI (non-ST elevated myocardial infarction) 07/07/2014  . Anemia, blood loss 07/06/2014  . Carotid artery bruit 08/28/2013  . Heart murmur, systolic 52/84/1324  . RBBB 02/16/2013  . Type 2 diabetes mellitus with peripheral neuropathy 02/16/2013  . Benign hypertensive heart disease without heart failure 08/02/2011  . Pure hypercholesterolemia 08/02/2011  . Asymptomatic PVCs 08/02/2011  . Breast cancer metastasized to bone 08/02/2011   Past Medical History:  Past Medical History  Diagnosis Date  . Hypertension   . MVP (mitral valve prolapse)   . Hypothyroidism   . Anemia   . Hypercholesterolemia   . Heart murmur   . Type II diabetes mellitus   . Arthritis     "back; knees, hands" (05/11/2015)  . Breast cancer, right breast     "cells went into the blood and caused her hip to break" (05/11/2015)   Past Surgical History:  Past Surgical History  Procedure Laterality Date  . Hemiarthroplasty hip Right     "partial replacement"  . Pelvic and para-aortic lymph node dissection    . Tonsillectomy    . Appendectomy    . Cholecystectomy    . US echocardiography  03/30/2009    EF  55-60%  . Cardiovascular stress test  01/28/2008    EF 74%  . Esophagogastroduodenoscopy N/A 07/08/2014    Procedure: ESOPHAGOGASTRODUODENOSCOPY (EGD);  Surgeon: Winfield Cunas., MD;  Location: [facility];  Service: Endoscopy;  Laterality: N/A;  . Colonoscopy N/A 07/10/2014    Procedure: COLONOSCOPY;  Surgeon: Lear Ng, MD;  Location: [facility];  Service: Endoscopy;  Laterality: N/A;  . Total abdominal hysterectomy      w/BSO  . Dilation and curettage of uterus    . Breast biopsy Right 2009  . Breast lumpectomy Right 2009  . Fracture surgery    . Cataract extraction w/ intraocular lens  implant, bilateral Bilateral    HPI:  81 yo female w/ PMHx of HTN, HLD, DM, MVP, CLL and metastatic breast CA presenting with slurred speech, facial droop, and right-sided weakness. TPA received.    Assessment / Plan / Recommendation Clinical Impression  Pt has mild attentional deficits, which she and her daughter both report to be her baseline. Speech is clear and easily intelligible. Pt lives with her daughter and has assistance for higher level cognitive tasks, such as medication management. No acute SLP needs identified.    SLP Assessment  Patient does not need any further Speech Lanaguage Pathology Services    Follow Up Recommendations  None;24 hour supervision/assistance (upon initial return home)    Pertinent Vitals/Pain Pain Assessment: No/denies pain   SLP Goals  Patient/Family Stated Goal: none stated - they feel that she is at her baseline  SLP Evaluation Prior Functioning  Cognitive/Linguistic Baseline: Baseline deficits Baseline deficit details: pt reports mild attentional deficits at baseline, daughter helps with organization for medication management. Type of Home: House  Lives With: Daughter Available Help at Discharge: Family;Friend(s);Available 24 hours/day   Cognition  Overall Cognitive Status: History of cognitive impairments - at baseline (mild  attentional deficits noted) Orientation Level: Oriented X4    Comprehension  Auditory Comprehension Overall Auditory Comprehension: Appears within functional limits for tasks assessed Visual Recognition/Discrimination Discrimination: Within Function Limits    Expression Expression Primary Mode of Expression: Verbal Verbal Expression Overall Verbal Expression: Appears within functional limits for tasks assessed   Oral / Motor Motor Speech Overall Motor Speech: Appears within functional limits for tasks assessed      Germain Osgood, M.A. CCC-SLP [phone]  Germain Osgood 08/02/2015, 11:45 AM

## 2015-08-02 NOTE — Progress Notes (Signed)
STROKE TEAM PROGRESS NOTE  HPI Molly [name] is an 81 y.o. female who was last seen normal by her daughter at 73 . She went to take a nap and awoke with right sided weakness. EMS was called and noted right sided facial droop and weakness. EMS brought her to hospital as code stroke. CT obtained and showed no acute findings. Patient was still within tPA window and tPA was administered. NIH stroke score was 7. Blood pressure was elevated requiring intervention with IV hydralazine.  Date last known well: Date: 07/29/2015 Time last known well: Time: 07:10 tPA Given: Yes Modified Rankin: Rankin Score=0   SUBJECTIVE (INTERVAL HISTORY) Her daughter is  at the bedside.  Overall she feels her condition is rapidly improving. Her right arm strength is much better but still has right facial droop. She is wanting discharge   OBJECTIVE Temp:  [97.7 F (36.5 C)-98.8 F (37.1 C)] 97.7 F (36.5 C) (09/20 1310) Pulse Rate:  [55-68] 55 (09/20 1310) Cardiac Rhythm:  [-] Sinus bradycardia;Bundle branch block (09/20 0731) Resp:  [18-20] 20 (09/20 1310) BP: (149-160)/(42-64) 156/46 mmHg (09/20 1310) SpO2:  [96 %-100 %] 96 % (09/20 1310)  CBC:  Recent Labs Lab 07/29/15 1059  07/31/15 0250 08/01/15 0201  WBC 23.8*  < > 25.2* 25.1*  NEUTROABS 11.5*  --   --   --   HGB 14.3  < > 13.1 12.0  HCT 42.6  < > 39.8 37.5  MCV 89.9  < > 90.9 90.6  PLT 292  < > 262 291  < > = values in this interval not displayed.  Basic Metabolic Panel:   Recent Labs Lab 07/31/15 0250 08/01/15 0201  NA 139 136  K 3.0* 3.0*  CL 104 103  CO2 25 25  GLUCOSE 184* 152*  BUN 6 9  CREATININE 0.82 1.02*  CALCIUM 9.1 8.8*    Lipid Panel:     Component Value Date/Time   CHOL 186 07/30/2015 0239   TRIG 182* 07/30/2015 0239   HDL 36* 07/30/2015 0239   CHOLHDL 5.2 07/30/2015 0239   VLDL 36 07/30/2015 0239   LDLCALC 114* 07/30/2015 0239   HgbA1c:  Lab Results  Component Value Date   HGBA1C 7.8* 07/30/2015    Urine Drug Screen:     Component Value Date/Time   LABOPIA NONE DETECTED 05/11/2015 1312   COCAINSCRNUR NONE DETECTED 05/11/2015 1312   LABBENZ NONE DETECTED 05/11/2015 1312   AMPHETMU NONE DETECTED 05/11/2015 1312   THCU NONE DETECTED 05/11/2015 1312   LABBARB NONE DETECTED 05/11/2015 1312      IMAGING I have personally reviewed the radiological images below and agree with the radiology interpretations.  Ct Head Wo Contrast 07/29/2015    Atrophy with extensive supratentorial small vessel disease, stable. No acute infarct evident. No hemorrhage or mass effect. Calcification proximal left middle cerebral artery is a stable finding. There are retention cysts in the right maxillary antrum.    Mri and Mra head and neck W Wo Contrast  07/30/2015  IMPRESSION: Acute infarction affecting the left basal ganglia and radiating white matter tracts. No swelling or hemorrhage.  Extensive chronic small vessel ischemic changes elsewhere throughout the brain.  No carotid bifurcation disease. 30-50% stenoses at both vertebral artery origins.  Severe atherosclerotic disease intracranially. Advanced disease in the siphon regions and supraclinoid internal carotid arteries with stenosis of the left supraclinoid internal carotid artery of 80% or greater. Stenosis on the right is 60-70%. Approximately 50% stenoses in the distal  vertebral arteries and in the proximal basilar artery.  Diffuse atherosclerotic narrowing and irregularity in the smaller intracranial branches.    Dg Chest Port 1 View 07/29/2015    No active disease.   2D echo - Left ventricle: The cavity size was normal. There was moderate concentric hypertrophy. Systolic function was vigorous. The estimated ejection fraction was in the range of 65% to 70%. Wall motion was normal; there were no regional wall motion abnormalities. PHYSICAL EXAM  Temp:  [97.7 F (36.5 C)-98.8 F (37.1 C)] 97.7 F (36.5 C) (09/20 1310) Pulse Rate:   [55-68] 55 (09/20 1310) Resp:  [18-20] 20 (09/20 1310) BP: (149-160)/(42-64) 156/46 mmHg (09/20 1310) SpO2:  [96 %-100 %] 96 % (09/20 1310)  General - Well nourished, well developed, in no apparent distress, sitting in chair.  Ophthalmologic - Fundi not visualized due to eye movement.  Cardiovascular - Regular rate and rhythm.  Mental Status -  Level of arousal and orientation to time, place, and person were intact. Language including expression, naming, repetition, comprehension was assessed and found intact. Fund of Knowledge was mildy impaired  Cranial Nerves II - XII - II - Visual field intact OU. III, IV, VI - Extraocular movements intact. V - Facial sensation intact bilaterally. VII - mild right facial droop. VIII - Hearing & vestibular intact bilaterally. X - Palate elevates symmetrically. XI - Chin turning & shoulder shrug intact bilaterally. XII - Tongue protrusion intact.  Motor Strength - The patient's strength was normal in all extremities and pronator drift was absent.  Bulk was normal and fasciculations were absent.   Motor Tone - Muscle tone was assessed at the neck and appendages and was normal.  Reflexes - The patient's reflexes were 1+ in all extremities and she had no pathological reflexes.  Sensory - Light touch, temperature/pinprick were assessed and were symmetrical.    Coordination - The patient had normal movements in the hands and feet with no ataxia or dysmetria.  Tremor was absent.  Gait and Station - deferred due to high BP during rounds.   ASSESSMENT/PLAN [name] is a 81 y.o. 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 She did receive IV TPA 77 mg at 1115 on 07/29/2015.   Stroke: left BG/CR small infarct, likely secondary to small  vessel disease due to multiple stroke risk factors (HTN, HLD, DM, MRA diffuse athero).    Resultant  Right facial droop  MRI  Left BG/CR small infarct  MRA head and neck Diffuse intracranial stenosis.   2D Echo  There was moderate concentric hypertrophy. Systolic function was vigorous. The estimated ejection fraction was in the range of 65% to 70%. Wall motion was normal; there were no regional wall motion abnormalities  LDL 114  HgbA1c 7.8  VTE prophylaxis - lovenox Diet Carb Modified Fluid consistency:: Thin; Room service appropriate?: Yes Diet - low sodium heart healthy Diet Carb Modified  aspirin 81 mg orally every day prior to admission, now on dual antiplatelet for 3 months and then plavix alone due to diffuse intracranial athero.  Patient counseled to be compliant with her antithrombotic medications  Ongoing aggressive stroke risk factor management  Therapy recommendations: Pending   Disposition:  Pending   Hypertension  Blood pressure mildly high at times   Permissive hypertension (OK if < 220/120) but gradually normalize in 5-7 days  Hyperlipidemia  Home meds:  Zetia resumed in hospital  LDL 114, goal < 70  Resume zetia  Hx of statin allergy (feels crazy with lipitor)  Out pt follow up with PCP to consider PCSK9 inhibitors.  Diabetes  HgbA1c 7.8 goal < 7.0  Uncontrolled  CBG monitoring showed hyperglycemia  SSI  On lantus  DM education  Other Stroke Risk Factors  Advanced age  Breast cancer metastasis to bones and soft tissue - continue anastozole  Follow up with oncologist  Other Active Problems  hypokalemia UTI- recurrent h/o multiple courses of antibiotics Constipation- chronic problem  Hospital day # 4 Plan to Dc home today with home PT/OT.Continue aspirin and Plavix for secondary stroke prevention given bilateral cavernous carotid stenosis which may be symptomatic for 3 months. Given prior history of GI bleed she may  be at risk for bleed. I have discussed this risk-benefit with the patient and daughter on   and answered questions.    Antony Contras, MD Stroke Neurology 08/02/2015 3:02 PM   To contact Stroke Continuity provider, please refer to [email or web address] After hours, contact General Neurology

## 2015-08-02 NOTE — Care Management Note (Signed)
Case Management Note  Patient Details  Name: Molly [name] MRN: [id number] Date of Birth: 06-04-1923  Subjective/Objective:                    Action/Plan: Pt admitted with CVA. Pt is from home with family. Pt being discharged home with home health services. CM met with pt and her daughter [name] and gave them a list of [place] agencies. Patient chose [facility]. Miranda with [facility] notified and referral accepted. Bedside RN made aware.  Expected Discharge Date:                  Expected Discharge Plan:  [place]  In-House Referral:     Discharge planning Services  CM Consult  Post Acute Care Choice:    Choice offered to:     DME Arranged:    DME Agency:     HH Arranged:  PT, OT HH Agency:  [facility]  Status of Service:  Completed, signed off  Medicare Important Message Given:  Yes-second notification given Date Medicare IM Given:    Medicare IM give by:    Date Additional Medicare IM Given:    Additional Medicare Important Message give by:     If discussed at [place] of Stay Meetings, dates discussed:    Additional Comments:  Ollen Gross, RN 08/02/2015, 2:53 PM

## 2015-08-02 NOTE — Discharge Summary (Signed)
Stroke Discharge Summary  Patient ID: Molly [name]   MRN: [id number]      DOB: [birth year]  Date of Admission: 07/29/2015 Date of Discharge: 08/02/2015  Attending Physician:  Garvin Fila, MD, Stroke MD  Consulting Physician(s):     Wound care consult  Patient's PCP:  FULP, CAMMIE, MD  DISCHARGE DIAGNOSIS: Acute infarction affecting the left basal ganglia and radiating white matter tracts. Active Problems:   CVA (cerebral infarction)  BMI: Body mass index is 30.58 kg/(m^2).  Past Medical History  Diagnosis Date  . Hypertension   . MVP (mitral valve prolapse)   . Hypothyroidism   . Anemia   . Hypercholesterolemia   . Heart murmur   . Type II diabetes mellitus   . Arthritis     "back; knees, hands" (05/11/2015)  . Breast cancer, right breast     "cells went into the blood and caused her hip to break" (05/11/2015)   Past Surgical History  Procedure Laterality Date  . Hemiarthroplasty hip Right     "partial replacement"  . Pelvic and para-aortic lymph node dissection    . Tonsillectomy    . Appendectomy    . Cholecystectomy    . US echocardiography  03/30/2009    EF 55-60%  . Cardiovascular stress test  01/28/2008    EF 74%  . Esophagogastroduodenoscopy N/A 07/08/2014    Procedure: ESOPHAGOGASTRODUODENOSCOPY (EGD);  Surgeon: Winfield Cunas., MD;  Location: [facility];  Service: Endoscopy;  Laterality: N/A;  . Colonoscopy N/A 07/10/2014    Procedure: COLONOSCOPY;  Surgeon: Lear Ng, MD;  Location: [facility];  Service: Endoscopy;  Laterality: N/A;  . Total abdominal hysterectomy      w/BSO  . Dilation and curettage of uterus    . Breast biopsy Right 2009  . Breast lumpectomy Right 2009  . Fracture surgery    . Cataract extraction w/ intraocular lens  implant, bilateral Bilateral       Medication List    STOP taking these medications        nitrofurantoin (macrocrystal-monohydrate) 100 MG capsule  Commonly known as:  MACROBID      TAKE  these medications        acetaminophen 500 MG tablet  Commonly known as:  TYLENOL  Take 500 mg by mouth every 6 (six) hours as needed (pain).     amLODipine-valsartan 10-320 MG per tablet  Commonly known as:  EXFORGE  Take 1 tablet by mouth daily.     anastrozole 1 MG tablet  Commonly known as:  ARIMIDEX  TAKE 1 TABLET BY MOUTH EVERY DAY     aspirin EC 81 MG tablet  Take 81 mg by mouth daily.     atenolol 50 MG tablet  Commonly known as:  TENORMIN  Take 1 tablet (50 mg total) by mouth 3 (three) times daily.     B-12 500 MCG Tabs  Take 500 mcg by mouth daily after lunch.     CALCIUM + D PO  Take 1 tablet by mouth daily after lunch.     cephALEXin 500 MG capsule  Commonly known as:  KEFLEX  Take 1 capsule (500 mg total) by mouth every 12 (twelve) hours.     cholecalciferol 1000 UNITS tablet  Commonly known as:  VITAMIN D  Take 1,000 Units by mouth daily after lunch.     cloNIDine 0.1 MG tablet  Commonly known as:  CATAPRES  Take 1 tablet (0.1 mg  total) by mouth 2 (two) times daily.     clopidogrel 75 MG tablet  Commonly known as:  PLAVIX  Take 1 tablet (75 mg total) by mouth daily.     docusate sodium 100 MG capsule  Commonly known as:  COLACE  Take 2 capsules (200 mg total) by mouth 2 (two) times daily as needed for mild constipation.     ezetimibe 10 MG tablet  Commonly known as:  ZETIA  Take 1 tablet (10 mg total) by mouth daily.     FERROCITE 324 (106 FE) MG Tabs  Generic drug:  Ferrous Fumarate  TAKE 1 TABLET (106 MG OF IRON TOTAL) BY MOUTH DAILY.     LANTUS SOLOSTAR 100 UNIT/ML Solostar Pen  Generic drug:  Insulin Glargine  Inject 18 Units into the skin daily at 12 noon.     levothyroxine 75 MCG tablet  Commonly known as:  SYNTHROID, LEVOTHROID  Take 75 mcg by mouth daily before breakfast.     potassium chloride 10 MEQ tablet  Commonly known as:  KLOR-CON M10  TAKE 1 TABLET (10 MEQ TOTAL) BY MOUTH DAILY.        LABORATORY STUDIES CBC     Component Value Date/Time   WBC 25.1* 08/01/2015 0201   WBC 24.8* 02/07/2015 1432   RBC 4.14 08/01/2015 0201   RBC 4.20 02/07/2015 1432   HGB 12.0 08/01/2015 0201   HGB 12.5 02/07/2015 1432   HCT 37.5 08/01/2015 0201   HCT 38.0 02/07/2015 1432   PLT 291 08/01/2015 0201   PLT 336 02/07/2015 1432   MCV 90.6 08/01/2015 0201   MCV 90.5 02/07/2015 1432   MCH 29.0 08/01/2015 0201   MCH 29.8 02/07/2015 1432   MCHC 32.0 08/01/2015 0201   MCHC 32.9 02/07/2015 1432   RDW 13.9 08/01/2015 0201   RDW 14.0 02/07/2015 1432   LYMPHSABS 10.9* 07/29/2015 1059   LYMPHSABS 13.2* 02/07/2015 1432   MONOABS 1.2* 07/29/2015 1059   MONOABS 1.0* 02/07/2015 1432   EOSABS 0.2 07/29/2015 1059   EOSABS 0.3 02/07/2015 1432   BASOSABS 0.0 07/29/2015 1059   BASOSABS 0.1 02/07/2015 1432   CMP    Component Value Date/Time   NA 136 08/01/2015 0201   NA 136 09/22/2014 1426   K 3.0* 08/01/2015 0201   K 3.8 09/22/2014 1426   CL 103 08/01/2015 0201   CO2 25 08/01/2015 0201   CO2 21* 09/22/2014 1426   GLUCOSE 152* 08/01/2015 0201   GLUCOSE 308* 09/22/2014 1426   BUN 9 08/01/2015 0201   BUN 11.4 09/22/2014 1426   CREATININE 1.02* 08/01/2015 0201   CREATININE 0.9 09/22/2014 1426   CALCIUM 8.8* 08/01/2015 0201   CALCIUM 9.5 09/22/2014 1426   PROT 6.2* 07/29/2015 1059   PROT 6.7 09/22/2014 1426   ALBUMIN 3.4* 07/29/2015 1059   ALBUMIN 3.4* 09/22/2014 1426   AST 20 07/29/2015 1059   AST 14 09/22/2014 1426   ALT 19 07/29/2015 1059   ALT 17 09/22/2014 1426   ALKPHOS 79 07/29/2015 1059   ALKPHOS 90 09/22/2014 1426   BILITOT 0.6 07/29/2015 1059   BILITOT 0.23 09/22/2014 1426   GFRNONAA 47* 08/01/2015 0201   GFRAA 55* 08/01/2015 0201   COAGS Lab Results  Component Value Date   INR 1.11 07/29/2015   INR 1.10 05/11/2015   INR 1.20 07/07/2014   Lipid Panel    Component Value Date/Time   CHOL 186 07/30/2015 0239   TRIG 182* 07/30/2015 0239   HDL 36* 07/30/2015  0239   CHOLHDL 5.2 07/30/2015 0239    VLDL 36 07/30/2015 0239   LDLCALC 114* 07/30/2015 0239   HgbA1C  Lab Results  Component Value Date   HGBA1C 7.8* 07/30/2015   Cardiac Panel (last 3 results) No results for input(s): CKTOTAL, CKMB, TROPONINI, RELINDX in the last 72 hours. Urinalysis    Component Value Date/Time   COLORURINE YELLOW 07/29/2015 2117   APPEARANCEUR TURBID* 07/29/2015 2117   LABSPEC 1.014 07/29/2015 2117   PHURINE 6.5 07/29/2015 2117   GLUCOSEU NEGATIVE 07/29/2015 2117   HGBUR NEGATIVE 07/29/2015 2117   BILIRUBINUR NEGATIVE 07/29/2015 2117   KETONESUR NEGATIVE 07/29/2015 2117   PROTEINUR 100* 07/29/2015 2117   UROBILINOGEN 0.2 07/29/2015 2117   NITRITE NEGATIVE 07/29/2015 2117   LEUKOCYTESUR MODERATE* 07/29/2015 2117   Urine Drug Screen     Component Value Date/Time   LABOPIA NONE DETECTED 05/11/2015 1312   COCAINSCRNUR NONE DETECTED 05/11/2015 1312   LABBENZ NONE DETECTED 05/11/2015 1312   AMPHETMU NONE DETECTED 05/11/2015 1312   THCU NONE DETECTED 05/11/2015 1312   LABBARB NONE DETECTED 05/11/2015 1312    Alcohol Level No results found for: ETH   SIGNIFICANT DIAGNOSTIC STUDIES  Ct Head Wo Contrast 07/29/2015  Atrophy with extensive supratentorial small vessel disease, stable. No acute infarct evident. No hemorrhage or mass effect. Calcification proximal left middle cerebral artery is a stable finding. There are retention cysts in the right maxillary antrum.   Mri and Mra head and neck W Wo Contrast  07/30/2015 IMPRESSION: Acute infarction affecting the left basal ganglia and radiating white matter tracts. No swelling or hemorrhage. Extensive chronic small vessel ischemic changes elsewhere throughout the brain. No carotid bifurcation disease. 30-50% stenoses at both vertebral artery origins. Severe atherosclerotic disease intracranially. Advanced disease in the siphon regions and supraclinoid internal carotid arteries with stenosis of the left supraclinoid internal carotid artery of  80% or greater. Stenosis on the right is 60-70%. Approximately 50% stenoses in the distal vertebral arteries and in the proximal basilar artery. Diffuse atherosclerotic narrowing and irregularity in the smaller intracranial branches.   Dg Chest Port 1 View 07/29/2015  No active disease.   2D echo - Left ventricle: The cavity size was normal. There was moderate concentric hypertrophy. Systolic function was vigorous. The estimated ejection fraction was in the range of 65% to 70%. Wall motion was normal; there were no regional wall motion abnormalities.    HISTORY OF PRESENT ILLNESS Molly [name] is an 81 y.o. female who was last seen normal by her daughter at 76 . She went to take a nap and awoke with right sided weakness. EMS was called and noted right sided facial droop and weakness. EMS brought her to hospital as code stroke. CT obtained and showed no acute findings. Patient was still within tPA window and tPA was administered. NIH stroke score was 7. Blood pressure was elevated requiring intervention with IV hydralazine.  Date last known well: Date: 07/29/2015 Time last known well: Time: 07:10 tPA Given: Yes TPA was administered Modified Rankin: Rankin Score=0  HOSPITAL COURSE  ASSESSMENT/PLAN [name] is a 81 y.o. 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 She did receive IV TPA 77 mg at 1115 on 07/29/2015.   Stroke: left BG/CR small infarct, likely secondary to small vessel disease due to multiple stroke risk factors (HTN, HLD, DM, MRA  diffuse athero).   Resultant Right facial droop  MRI Left BG/CR small infarct  MRA head and neck Diffuse intracranial stenosis.  2D Echo There was moderate concentric hypertrophy. Systolic function was  vigorous. The estimated ejection fraction was in the range of 65% to 70%. Wall motion was normal; there were no regional wall motion abnormalities  LDL 114  HgbA1c 7.8  VTE prophylaxis - lovenox  Diet Carb Modified Fluid consistency:: Thin; Room service appropriate?: Yes  Diet - low sodium heart healthy  Diet Carb Modified  aspirin 81 mg orally every day prior to admission, now on dual antiplatelet for 3 months and then plavix alone due to diffuse intracranial athero.  Patient counseled to be compliant with her antithrombotic medications  Ongoing aggressive stroke risk factor management  Therapy recommendations: Home health physical and occupational therapies were recommended  Disposition: Discharged to home  Hypertension  Blood pressure mildly high at times  Permissive hypertension (OK if < 220/120) but gradually normalize in 5-7 days  Hyperlipidemia  Home meds: Zetia resumed in hospital  LDL 114, goal < 70  Resume zetia  Hx of statin allergy (feels crazy with lipitor)  Out pt follow up with PCP to consider PCSK9 inhibitors.  Diabetes  HgbA1c 7.8 goal < 7.0  Uncontrolled  CBG monitoring showed hyperglycemia  SSI  On lantus  DM education  Other Stroke Risk Factors  Advanced age  Breast cancer metastasis to bones and soft tissue - continue anastozole  Follow up with oncologist  Other Active Problems  hypokalemia UTI- recurrent h/o multiple courses of antibiotics Constipation- chronic problem  DISCHARGE EXAM Blood pressure 156/46, pulse 55, temperature 97.7 F (36.5 C), temperature source Oral, resp. rate 20, height 5\' 6"  (1.676 m), weight 85.9 kg (189 lb 6 oz), SpO2 96 %. General - Well nourished, well developed, in no apparent distress, sitting in chair.  Ophthalmologic - Fundi not visualized due to eye movement.  Cardiovascular - Regular rate and rhythm.  Mental Status -  Level of arousal and orientation to time,  place, and person were intact. Language including expression, naming, repetition, comprehension was assessed and found intact. Fund of Knowledge was mildy impaired  Cranial Nerves II - XII - II - Visual field intact OU. III, IV, VI - Extraocular movements intact. V - Facial sensation intact bilaterally. VII - mild right facial droop. VIII - Hearing & vestibular intact bilaterally. X - Palate elevates symmetrically. XI - Chin turning & shoulder shrug intact bilaterally. XII - Tongue protrusion intact.  Motor Strength - The patient's strength was normal in all extremities and pronator drift was absent. Bulk was normal and fasciculations were absent.  Motor Tone - Muscle tone was assessed at the neck and appendages and was normal.  Reflexes - The patient's reflexes were 1+ in all extremities and she had no pathological reflexes.  Sensory - Light touch, temperature/pinprick were assessed and were symmetrical.   Coordination - The patient had normal movements in the hands and feet with no ataxia or dysmetria. Tremor was absent.  Gait and Station - deferred due to high BP during rounds.  Discharge Diet   Diet Carb Modified Fluid consistency:: Thin; Room service appropriate?: Yes Diet - low sodium heart healthy Diet Carb Modified liquids  DISCHARGE PLAN  Disposition:  Discharged to home  aspirin 81 mg orally every day and clopidogrel 75 mg orally every day for secondary stroke prevention.  Continue aspirin and Plavix for 3 months then Plavix alone.  Follow-up FULP, CAMMIE,  MD in 2 weeks.  Follow-up with Dr. Antony Contras, Stroke Clinic in 2 months.  31 minutes were spent preparing discharge.  Mikey Bussing PA-C [facility] Pager [phone] 08/02/2015, 5:35 PM I have personally examined this patient, reviewed notes, independently viewed imaging studies, participated in medical decision making and plan of care. I have made any additions or clarifications  directly to the above note. Agree with note above.   Antony Contras, MD Medical Director [facility] Pager: [phone] 08/02/2015 6:02 PM

## 2015-08-02 NOTE — Progress Notes (Signed)
Nutrition Follow-up  DOCUMENTATION CODES:   Obesity unspecified  INTERVENTION:  Provided and discussed "Carbohydrate Counting for People with Diabetes" handout from the Academy of Nutrition and Dietetics No further nutrition interventions warranted at this time   NUTRITION DIAGNOSIS:   Inadequate oral intake related to inability to eat as evidenced by NPO status.  Discontinued/resolved  GOAL:   Patient will meet greater than or equal to 90% of their needs  Being met  MONITOR:   Diet advancement, PO intake, Labs, Weight trends, I & O's  REASON FOR ASSESSMENT:   Malnutrition Screening Tool    ASSESSMENT:   81 y.o. Female who was last seen normal by her daughter at 39 . She went to take a nap and awoke with right sided weakness. EMS was called and noted right sided facial droop and weakness. EMS brought her to hospital as code stroke. CT obtained and showed no acute findings. Patient was still within tPA window and tPA was administered.   Pt states that her appetite is good and she is eating normally. Per nursing notes pt is eating 50-85% of meals. Pt reports making an effort to lose weight in the past year, losing from 201 lbs to 189 lbs but, weight has been stable the past 2 months. She feels she is eating adequately to keep her strength up. Pt and her daughter had questions regarding the carb modified diet. RD answered questions and discussed handouts provided. RD name and contact information provided.   Labs: elevated glucose, low potassium  Diet Order:  Diet Carb Modified Fluid consistency:: Thin; Room service appropriate?: Yes Diet - low sodium heart healthy Diet Carb Modified  Skin:  Wound (see comment) (moisture associated skin damage on groin and pelvis)  Last BM:  9/19  Height:   Ht Readings from Last 1 Encounters:  07/29/15 5' 6"  (1.676 m)    Weight:   Wt Readings from Last 1 Encounters:  07/29/15 189 lb 6 oz (85.9 kg)    Ideal Body Weight:  59  kg  BMI:  Body mass index is 30.58 kg/(m^2).  Estimated Nutritional Needs:   Kcal:  1550-1750  Protein:  90-100 grams  Fluid:  1.7-1.9 L/day  EDUCATION NEEDS:   No education needs identified at this time  [place], LDN Inpatient Clinical Dietitian Pager: [phone] After Hours Pager: [phone]

## 2015-08-02 NOTE — Progress Notes (Signed)
Occupational Therapy Treatment Patient Details Name: Molly [name] MRN: [id number] DOB: [birth year] Today's Date: 08/02/2015    History of present illness 81 yo female w/ PMHx of HTN, HLD, DM, MVP, CLL and metastatic breast CA presenting with slurred speech, facial droop, and right-sided weakness. TPA received.    OT comments  Pt stating she did not have a good night. Declining use of RW in absence of a 4WW of which she is familiar.  Performed transfers to [facility] and chair with supervision. Donned socks and groomed with set up in sitting. Pt is eager to go home.  Follow Up Recommendations  Home health OT;Supervision/Assistance - 24 hour    Equipment Recommendations  None recommended by OT    Recommendations for Other Services      Precautions / Restrictions Precautions Precautions: Fall Restrictions Weight Bearing Restrictions: No       Mobility Bed Mobility Overal bed mobility: Needs Assistance Bed Mobility: Supine to Sit     Supine to sit: Supervision     General bed mobility comments: increased time,use of rail, HOB up  Transfers Overall transfer level: Needs assistance Equipment used: 1 person hand held assist Transfers: Sit to/from [facility] Sit to Stand: Supervision Stand pivot transfers: Supervision            Balance Overall balance assessment: Needs assistance                                 ADL Overall ADL's : Needs assistance/impaired     Grooming: Wash/dry hands;Wash/dry face;Sitting;Set up               Lower Body Dressing: Supervision/safety;Sit to/from stand   Toilet Transfer: Psychologist, counselling Details (indicate cue type and reason): pt declined using RW, wanting to use [facility] Toileting- Clothing Manipulation and Hygiene: Supervision/safety;Sit to/from stand                Vision                     Perception     Praxis      Cognition   Behavior  During Therapy: Flat affect Overall Cognitive Status: Within Functional Limits for tasks assessed (pt demanding)                       Extremity/Trunk Assessment               Exercises     Shoulder Instructions       General Comments      Pertinent Vitals/ Pain       Pain Assessment: No/denies pain  Home Living Family/patient expects to be discharged to:: Private residence Living Arrangements: Children Available Help at Discharge: Family;Available PRN/intermittently Type of Home: House                                  Prior Functioning/Environment              Frequency Min 3X/week     Progress Toward Goals  OT Goals(current goals can now be found in the care plan section)  Progress towards OT goals: Progressing toward goals  Acute Rehab OT Goals Patient Stated Goal: to go home  Plan Discharge plan remains appropriate    Co-evaluation  End of Session     Activity Tolerance Patient tolerated treatment well   Patient Left in chair;with call bell/phone within reach   Nurse Communication          Time: 1947-1252 OT Time Calculation (min): 28 min  Charges: OT General Charges $OT Visit: 1 Procedure OT Treatments $Self Care/Home Management : 23-37 mins  Malka So 08/02/2015, 9:11 AM [phone]

## 2015-08-03 LAB — CULTURE, BLOOD (ROUTINE X 2)
Culture: NO GROWTH
Culture: NO GROWTH

## 2015-08-08 ENCOUNTER — Other Ambulatory Visit: Payer: Self-pay

## 2015-08-08 ENCOUNTER — Other Ambulatory Visit: Payer: Medicare Other

## 2015-08-08 ENCOUNTER — Telehealth: Payer: Self-pay | Admitting: Cardiology

## 2015-08-08 ENCOUNTER — Ambulatory Visit: Payer: Medicare Other | Admitting: Nurse Practitioner

## 2015-08-08 DIAGNOSIS — Z79899 Other long term (current) drug therapy: Secondary | ICD-10-CM

## 2015-08-08 DIAGNOSIS — I639 Cerebral infarction, unspecified: Secondary | ICD-10-CM

## 2015-08-08 NOTE — Patient Outreach (Signed)
[facility]) Care Management  08/08/2015  JACI DESANTO 1923-10-30 [id number]   Telephonic Care Coordination Note:   Issue:  Primary MD:  Patient has no appt scheduled and patient does not plan to schedule [facility] follow-up with Dr. Chapman Fitch. Patient should complete 2 week follow-up by 08/16/2015.Patient unable to locate Medicare provider accepting new patients.  RN CM advised that RN CM will attempt to search for MD who is currently accepting new Medicare patients. RN CM contacted  [employer] at [place], [place] [address] Phone: [phone] Office contact confirms the following MD's are taking new Medicare patients.   Dr. Rennis Golden, MD / Internal Medicine.  Ples Specter [place], FNP Billey Gosling, MD  Plan:   RN CM will provide patient with update.   Mariann Laster, RN, BSN, [facility], CCM  [employer] Management Coordinator [phone] Direct [phone] Cell [phone] Office [phone] Fax

## 2015-08-08 NOTE — Patient Outreach (Signed)
[facility]) Care Management  08/08/2015  [name] [birth year] [id number]   Telephonic Care Coordination Note  Issue:  Primary Care needed  Outreach call #1 to patient to provide the following update: the following are taking new Medicare patients.  Daughter/Gloria reached and provided the following.   [facility] [address] [place]  [place], [place] [address]  [phone]  Thayer Jew, MD Jani Gravel, MD Merrilee Seashore, MD  [name] [facility] [place]  [place], [place] [address]  [phone]  Dr. Gwenyth Bouillon Dr. Marda Stalker [facility] [place],  [phone]  Dr. Rennis Golden, MD / Internal Medicine.  Ples Specter [place], FNP Billey Gosling, MD   Plan: RN CM will follow-up again in one week.  RN CM advised to contact RN CM for questions / concerns as needed or any further assistance needed with establishing primary care.   Mariann Laster, RN, BSN, [facility], CCM  [employer] Management Coordinator [phone] Direct [phone] Cell [phone] Office [phone] Fax

## 2015-08-08 NOTE — Telephone Encounter (Signed)
New message    Daughter [name]  calling hospital MD put her on some medication wants to discuss.  Pt c/o medication issue:  1. Name of Medication:  Clonidine 0.1 mg  2. How are you currently taking this medication (dosage and times per day)? One in am  / one in pm    3. Are you having a reaction (difficulty breathing--STAT)? Eye blurred for the last 3 months   4. What is your medication issue? Should she continue to take this medication

## 2015-08-08 NOTE — Patient Outreach (Signed)
[facility]) Care Management  08/08/2015  Molly [name] February 02, 1923 [id number]  Emmi Stroke Week #1   Referral Date:  08/08/15 Issue:  Red Alert feeling worse all over: yes Referral Source:  [place] call to patient:  Patient and daughter reached.   PCP:  Antony Blackbird, MD - patient states she has no follow-up appt's pending with Dr. Chapman Fitch and wants a new PCP.   States she feels bad and has not scheduled any appt to see Dr. Chapman Fitch.   States home health RN provided Dr. Carlyle Lipa name but he is not accepting any new patients at this time. Discharge instructions per Epic:    Follow-up FULP, CAMMIE, MD in 2 weeks.  Follow-up with Dr. Antony Contras, Stroke Clinic in 2 months. (09/06/15 appt scheduled).   Other Providers:  Cardiologist:  Dr. Mare Ferrari Internal Medicine / Diabetes care  Social:  Patient lives in her home alone.  Patient d/c from [facility] 08/02/[facility].   Patient reached and confirmed she is feeling bad all over.  States she has pain in her back, right kidney and vagina.  Patient states this pain is unrelated to her stroke.  States she has an appt with her Oncologist, Dr. Ammie Dalton on  Friday (08/12/15).  Caregiver:  Daughter/Molly [name] staying with patient this week and has taken Family Medical Leave at work.  Daughter has to return to work Sport and exercise psychologist) next week and patient expressed concerns about being alone.  Daughter confirms patient refused to go to [facility] but is not able to be home alone and has much difficulty with walking.  Daughter has found a friend who may be willing to sit with patient next week while daughter returns to work for less than the typical sitter rates. States affordability is a barrier at this time.  Patient has appt to meet sitter to determine if a good fit for patient.  HH Services:  SN, PT, OT ([facility]. Transportation:  Daughter will transport  but difficultly getting patient out of the house right  now due to poor mobility.  Advanced Directives:  Daughter is not sure.  States she is the POA and patient has a will.  DME:  BP cuff, glucometer  CVA Patient self checks home BP readings and states BP has been running in the 180's-190's/70's.  States BP was running around 140/150/70 while inpatient but this was while laying down all the time.  Daughter states patient stays stressed thinking about her health and being home alone.  SN started Sunday 9/25 180+/70  PT  [facility] twice a week Daughter states the stroke affected the right side which is the same side as patient's  lymph node removal.   States all body functions including speech are back but patient is frustrated with her mobility and weakness.    Diabetes Patient blood sugar this am 188.   Patient self checks and states was well under control while in the hospital.  Daughter expresses concern with home management of Blood sugar and insulin management.    Medications:   Patient is frustrated with her medications.  States she does not understand why she has to take a medication like Plavix which increases her risk of a bleed since she has a history of previous bleed.  States MD has clarified why she must remain on this medication but she is not at ease taking it due to her past.  Patient c/o medication side effect of Blurred vision on clonidine  which interferes with her ability to recognize visual changes associated to stroke.  Barrier to care:  Cost of medications:  Patient is currently in the "[employer]."  Daughter states recently went to pharmacy to pick up Zetia which normally cost $45.00 co-pay but was $129.00 due to donut hole.  Patient and daughter advised to put back on the shelf and would not purchase the medication.   Daughter contacted insurance company and was advised patient will remain in donut hole for the remainder of this year.   Consent:  Patient gives verbal consent to discuss all PHI with PCG/Daughter/POA/Molly [name].     Plan: Primary MD: RN CM confirmed with patient no appt scheduled and patient does not plan to schedule with Dr. Chapman Fitch.  Patient should complete 2 week follow-up by 08/16/2015.   RN CM advised that RN CM will attempt to search for MD who is currently accepting new Medicare patients.  Social:  RN CM provided resource option of Zacarias Pontes private pay Advertising copywriter.  Daughter/patient refused sitter list stating they could not afford those folks.  RN CM encouraged to review will and make sure will remains updated if more than 79 years old.  RN CM provided education on Advanced Directives and how to complete.  Patient and daughter agree to mailing of Advanced Directive. RN CM mailed copy of Advanced Directive (08/08/15).  RN CM provided option of [facility] SW referral to discuss possible home resources such as  -Dietitian -financial assessment to assess for other possible eligibility for home sitter services.  -transportation resources due to daughter works.  -adult Electrical engineer.   CVA: RN CM reviewed signs and symptoms of Stroke.  RN CM encouraged 911 / urgent care for identified Red Zone symptoms.  RN CM encouraged home PT services and provided education on consideration to OP PT services as patient progresses.  (Daughter refused stating transportation is an issue and cost) Therapist, sports CM sent Emmi Education:  -What You Can Do To Prevent A Second Stroke  -Recovering After Stroke  Medications: RN CM discussed risk associated to not taking medications. RN CM discussed option of [facility] pharmacy referral to discuss medication barriers associate to "donut hole."  Daughter and patient agreed to referral.   RN CM sent referral for [facility] pharmacist and also requested assessment of medication pick-up issues since daughter reports transportation issues.    RN CM advised to please notify MD of any changes in condition prior to scheduled appt's.   RN CM provided contact name and #, 24-hour  nurse line # [phone].   RN CM confirmed patient is aware of 911 services for urgent emergency needs. RN CM advised in next follow-call within one week for WEEK 2 EMMI STROKE PROGRAM from [place].   Mariann Laster, RN, BSN, [facility], CCM  [employer] Management Coordinator [phone] Direct [phone] Cell [phone] Office [phone] Fax

## 2015-08-08 NOTE — Telephone Encounter (Signed)
No answer

## 2015-08-08 NOTE — Patient Outreach (Signed)
[name] Care Management  08/08/2015  Molly [name] 11/06/1923 [id number]   RED on EMMI Stroke Dashboard, assigned Mariann Laster, RN.  Thanks, Ronnell [place]. [place], [place] Assistant Phone: [phone] Fax: [phone]

## 2015-08-08 NOTE — Telephone Encounter (Signed)
Try increasing atenolol to 4x a day.

## 2015-08-08 NOTE — Patient Outreach (Signed)
[facility]) Care Management  08/08/2015  YERLIN GASPARYAN Mar 18, 1923 [id number]   Telephonic Care Coordination Note:   Issue: Primary MD: Patient has no appt scheduled and patient does not plan to schedule [facility] follow-up with Dr. Chapman Fitch. Patient should complete 2 week follow-up by 08/16/2015.Patient unable to locate Medicare provider accepting new patients. RN CM advised that RN CM will attempt to search for MD who is currently accepting new Medicare patients. RN CM contacted   [place] at [place]  [place], [place] [address]  [phone]  Dr. Gwenyth Bouillon, Dr. Marda Stalker and Dr. Harlene Ramus are taking new patients.   Plan:  RN CM will provide patient with update.   Mariann Laster, RN, BSN, [facility], CCM  [employer] Management Coordinator [phone] Direct [phone] Cell [phone] Office [phone] Fax

## 2015-08-08 NOTE — Telephone Encounter (Signed)
Spoke to daughter and mothers blood pressure is still running in the 180's even with the Clonidine Patient is very concerned about taking the Clonidine, daughter read something about it causing visual problems. Patient has been having blurred vision x 3 months. Did advise to get appointment with her eye doctor.  Daughter would like  Dr. Mare Ferrari to review and give recommendations

## 2015-08-08 NOTE — Telephone Encounter (Signed)
Will try again in the morning

## 2015-08-08 NOTE — Patient Outreach (Signed)
[place] [facility]) Care Management  08/08/2015  Molly [name] 03-Jul-1923 [id number]   Telephonic Care Coordination Note:   Issue: Primary MD: Patient has no appt scheduled and patient does not plan to schedule [facility] follow-up with Dr. Chapman Fitch. Patient should complete 2 week follow-up by 08/16/2015.Patient unable to locate Medicare provider accepting new patients. RN CM advised that RN CM will attempt to search for MD who is currently accepting new Medicare patients  RN CM contacted  [facility] [address] [place]  [place], [place] [address]  [phone]  The following are taking new Medicare patients.  Thayer Jew, MD Jani Gravel, MD Merrilee Seashore, MD  Plan:  RN CM will provide patient with update.   Mariann Laster, RN, BSN, [facility], CCM  [employer] Management Coordinator [phone] Direct [phone] Cell [phone] Office [phone] Fax

## 2015-08-09 ENCOUNTER — Inpatient Hospital Stay (HOSPITAL_COMMUNITY)
Admission: EM | Admit: 2015-08-09 | Discharge: 2015-08-18 | DRG: 065 | Disposition: A | Discharge status: Skilled Nursing Facility | Payer: Medicare Other | Attending: Neurology | Admitting: Neurology

## 2015-08-09 DIAGNOSIS — M1990 Unspecified osteoarthritis, unspecified site: Secondary | ICD-10-CM | POA: Diagnosis present

## 2015-08-09 DIAGNOSIS — Z96641 Presence of right artificial hip joint: Secondary | ICD-10-CM | POA: Diagnosis present

## 2015-08-09 DIAGNOSIS — Z993 Dependence on wheelchair: Secondary | ICD-10-CM

## 2015-08-09 DIAGNOSIS — Z91041 Radiographic dye allergy status: Secondary | ICD-10-CM

## 2015-08-09 DIAGNOSIS — I6359 Cerebral infarction due to unspecified occlusion or stenosis of other cerebral artery: Secondary | ICD-10-CM

## 2015-08-09 DIAGNOSIS — D729 Disorder of white blood cells, unspecified: Secondary | ICD-10-CM

## 2015-08-09 DIAGNOSIS — I63519 Cerebral infarction due to unspecified occlusion or stenosis of unspecified middle cerebral artery: Secondary | ICD-10-CM | POA: Diagnosis not present

## 2015-08-09 DIAGNOSIS — F419 Anxiety disorder, unspecified: Secondary | ICD-10-CM | POA: Diagnosis present

## 2015-08-09 DIAGNOSIS — Z79899 Other long term (current) drug therapy: Secondary | ICD-10-CM

## 2015-08-09 DIAGNOSIS — K649 Unspecified hemorrhoids: Secondary | ICD-10-CM | POA: Diagnosis present

## 2015-08-09 DIAGNOSIS — Z7982 Long term (current) use of aspirin: Secondary | ICD-10-CM

## 2015-08-09 DIAGNOSIS — C9110 Chronic lymphocytic leukemia of B-cell type not having achieved remission: Secondary | ICD-10-CM | POA: Diagnosis present

## 2015-08-09 DIAGNOSIS — Z90710 Acquired absence of both cervix and uterus: Secondary | ICD-10-CM

## 2015-08-09 DIAGNOSIS — E039 Hypothyroidism, unspecified: Secondary | ICD-10-CM | POA: Diagnosis present

## 2015-08-09 DIAGNOSIS — Z794 Long term (current) use of insulin: Secondary | ICD-10-CM

## 2015-08-09 DIAGNOSIS — E860 Dehydration: Secondary | ICD-10-CM | POA: Diagnosis present

## 2015-08-09 DIAGNOSIS — I4510 Unspecified right bundle-branch block: Secondary | ICD-10-CM | POA: Diagnosis present

## 2015-08-09 DIAGNOSIS — R001 Bradycardia, unspecified: Secondary | ICD-10-CM | POA: Diagnosis present

## 2015-08-09 DIAGNOSIS — I341 Nonrheumatic mitral (valve) prolapse: Secondary | ICD-10-CM | POA: Diagnosis present

## 2015-08-09 DIAGNOSIS — C50911 Malignant neoplasm of unspecified site of right female breast: Secondary | ICD-10-CM | POA: Diagnosis present

## 2015-08-09 DIAGNOSIS — R031 Nonspecific low blood-pressure reading: Secondary | ICD-10-CM | POA: Diagnosis present

## 2015-08-09 DIAGNOSIS — Z79811 Long term (current) use of aromatase inhibitors: Secondary | ICD-10-CM

## 2015-08-09 DIAGNOSIS — E785 Hyperlipidemia, unspecified: Secondary | ICD-10-CM | POA: Diagnosis present

## 2015-08-09 DIAGNOSIS — C7951 Secondary malignant neoplasm of bone: Secondary | ICD-10-CM | POA: Diagnosis present

## 2015-08-09 DIAGNOSIS — G8191 Hemiplegia, unspecified affecting right dominant side: Secondary | ICD-10-CM | POA: Diagnosis present

## 2015-08-09 DIAGNOSIS — R29810 Facial weakness: Secondary | ICD-10-CM | POA: Diagnosis present

## 2015-08-09 DIAGNOSIS — R339 Retention of urine, unspecified: Secondary | ICD-10-CM | POA: Diagnosis present

## 2015-08-09 DIAGNOSIS — I639 Cerebral infarction, unspecified: Secondary | ICD-10-CM

## 2015-08-09 DIAGNOSIS — I350 Nonrheumatic aortic (valve) stenosis: Secondary | ICD-10-CM | POA: Diagnosis present

## 2015-08-09 DIAGNOSIS — Z888 Allergy status to other drugs, medicaments and biological substances status: Secondary | ICD-10-CM

## 2015-08-09 DIAGNOSIS — E1142 Type 2 diabetes mellitus with diabetic polyneuropathy: Secondary | ICD-10-CM | POA: Diagnosis present

## 2015-08-09 DIAGNOSIS — Z9842 Cataract extraction status, left eye: Secondary | ICD-10-CM

## 2015-08-09 DIAGNOSIS — E669 Obesity, unspecified: Secondary | ICD-10-CM | POA: Diagnosis present

## 2015-08-09 DIAGNOSIS — R579 Shock, unspecified: Secondary | ICD-10-CM | POA: Diagnosis present

## 2015-08-09 DIAGNOSIS — Z6831 Body mass index (BMI) 31.0-31.9, adult: Secondary | ICD-10-CM

## 2015-08-09 DIAGNOSIS — Z885 Allergy status to narcotic agent status: Secondary | ICD-10-CM

## 2015-08-09 DIAGNOSIS — I6523 Occlusion and stenosis of bilateral carotid arteries: Secondary | ICD-10-CM | POA: Diagnosis present

## 2015-08-09 DIAGNOSIS — Z9841 Cataract extraction status, right eye: Secondary | ICD-10-CM

## 2015-08-09 DIAGNOSIS — R471 Dysarthria and anarthria: Secondary | ICD-10-CM | POA: Diagnosis present

## 2015-08-09 DIAGNOSIS — Z961 Presence of intraocular lens: Secondary | ICD-10-CM | POA: Diagnosis present

## 2015-08-09 DIAGNOSIS — I119 Hypertensive heart disease without heart failure: Secondary | ICD-10-CM | POA: Diagnosis present

## 2015-08-09 DIAGNOSIS — Z8673 Personal history of transient ischemic attack (TIA), and cerebral infarction without residual deficits: Secondary | ICD-10-CM

## 2015-08-09 DIAGNOSIS — K922 Gastrointestinal hemorrhage, unspecified: Secondary | ICD-10-CM | POA: Diagnosis present

## 2015-08-09 DIAGNOSIS — R0989 Other specified symptoms and signs involving the circulatory and respiratory systems: Secondary | ICD-10-CM | POA: Diagnosis present

## 2015-08-09 DIAGNOSIS — I998 Other disorder of circulatory system: Secondary | ICD-10-CM | POA: Diagnosis present

## 2015-08-09 MED ORDER — SPIRONOLACTONE 25 MG PO TABS: ORAL_TABLET | ORAL | Status: DC

## 2015-08-09 NOTE — Telephone Encounter (Signed)
Advised daughter and scheduled labs  

## 2015-08-09 NOTE — Telephone Encounter (Signed)
Daughter states heart rate running in the mid 50's and concerned about increasing Atenolol Will forward to  Dr. Mare Ferrari for review

## 2015-08-09 NOTE — ED Provider Notes (Signed)
CSN: [id number]   Arrival date & time 08/09/15 2355  History  By signing my name below, I, Altamease Oiler, attest that this documentation has been prepared under the direction and in the presence of April Palumbo, MD. Electronically Signed: Altamease Oiler, ED Scribe. 08/10/2015. 12:50 AM.  Chief Complaint  Patient presents with  . Code Stroke    HPI Patient is a 81 y.o. female presenting with Acute Neurological Problem. The history is provided by the EMS personnel. No language interpreter was used.  Cerebrovascular Accident This is a recurrent problem. The current episode started 3 to 5 hours ago. The problem occurs constantly. The problem has not changed since onset.Pertinent negatives include no chest pain. Nothing aggravates the symptoms. Nothing relieves the symptoms. She has tried nothing for the symptoms. The treatment provided no relief.   Molly [name] is a 81 y.o. female with PMHx of stroke 10 days ago, HTN, DM, hypercholesteremia, who presents to the Emergency Department complaining of right sided weakness, right facial droop, and slurred speech tonight. Pt was last seen normal around 10:50 PM before bed. She is on Plavix ans aspirin.   Past Medical History  Diagnosis Date  . Hypertension   . MVP (mitral valve prolapse)   . Hypothyroidism   . Anemia   . Hypercholesterolemia   . Heart murmur   . Type II diabetes mellitus   . Arthritis     "back; knees, hands" (05/11/2015)  . Breast cancer, right breast     "cells went into the blood and caused her hip to break" (05/11/2015)    Past Surgical History  Procedure Laterality Date  . Hemiarthroplasty hip Right     "partial replacement"  . Pelvic and para-aortic lymph node dissection    . Tonsillectomy    . Appendectomy    . Cholecystectomy    . US echocardiography  03/30/2009    EF 55-60%  . Cardiovascular stress test  01/28/2008    EF 74%  . Esophagogastroduodenoscopy N/A 07/08/2014    Procedure:  ESOPHAGOGASTRODUODENOSCOPY (EGD);  Surgeon: Winfield Cunas., MD;  Location: [facility];  Service: Endoscopy;  Laterality: N/A;  . Colonoscopy N/A 07/10/2014    Procedure: COLONOSCOPY;  Surgeon: Lear Ng, MD;  Location: [facility];  Service: Endoscopy;  Laterality: N/A;  . Total abdominal hysterectomy      w/BSO  . Dilation and curettage of uterus    . Breast biopsy Right 2009  . Breast lumpectomy Right 2009  . Fracture surgery    . Cataract extraction w/ intraocular lens  implant, bilateral Bilateral     Family History  Problem Relation Age of Onset  . Hypertension Mother   . Heart attack Father   . Hypertension Father   . Diabetes Sister   . Hypertension Sister   . Liver cancer Sister   . Heart attack Brother   . Hypertension Brother   . Diabetes Brother   . Diabetes Sister   . Hypertension Sister     Social History  Substance Use Topics  . Smoking status: Never Smoker   . Smokeless tobacco: Never Used  . Alcohol Use: No     Review of Systems  Cardiovascular: Negative for chest pain.  Neurological: Positive for facial asymmetry, speech difficulty and weakness.  All other systems reviewed and are negative.  Home Medications   Prior to Admission medications   Medication Sig Start Date End Date Taking? Authorizing Provider  acetaminophen (TYLENOL) 500 MG tablet Take 500 mg  by mouth every 6 (six) hours as needed (pain).    Historical Provider, MD  amLODipine-valsartan (EXFORGE) 10-320 MG per tablet Take 1 tablet by mouth daily. 03/22/15   Darlin Coco, MD  anastrozole (ARIMIDEX) 1 MG tablet TAKE 1 TABLET BY MOUTH EVERY DAY 02/28/15   Ladell Pier, MD  aspirin EC 81 MG tablet Take 81 mg by mouth daily.    Historical Provider, MD  atenolol (TENORMIN) 50 MG tablet Take 1 tablet (50 mg total) by mouth 3 (three) times daily. 07/06/15   Darlin Coco, MD  Calcium Carbonate-Vitamin D (CALCIUM + D PO) Take 1 tablet by mouth daily after lunch.     Historical  Provider, MD  cephALEXin (KEFLEX) 500 MG capsule Take 1 capsule (500 mg total) by mouth every 12 (twelve) hours. 08/02/15   David L Rinehuls, PA-C  cholecalciferol (VITAMIN D) 1000 UNITS tablet Take 1,000 Units by mouth daily after lunch.     Historical Provider, MD  cloNIDine (CATAPRES) 0.1 MG tablet Take 1 tablet (0.1 mg total) by mouth 2 (two) times daily. 08/02/15   David L Rinehuls, PA-C  clopidogrel (PLAVIX) 75 MG tablet Take 1 tablet (75 mg total) by mouth daily. 08/02/15   David L Rinehuls, PA-C  Cyanocobalamin (B-12) 500 MCG TABS Take 500 mcg by mouth daily after lunch.     Historical Provider, MD  docusate sodium (COLACE) 100 MG capsule Take 2 capsules (200 mg total) by mouth 2 (two) times daily as needed for mild constipation. 06/21/15   Thurnell Lose, MD  ezetimibe (ZETIA) 10 MG tablet Take 1 tablet (10 mg total) by mouth daily. Patient not taking: Reported on 08/08/2015 09/07/14   Darlin Coco, MD  FERROCITE 324 MG TABS TAKE 1 TABLET (106 MG OF IRON TOTAL) BY MOUTH DAILY. Patient taking differently: TAKE 1 TABLET (106 MG OF IRON TOTAL) BY MOUTH DAILY AFTER LUNCH 02/28/15   Ladell Pier, MD  LANTUS SOLOSTAR 100 UNIT/ML Solostar Pen Inject 18 Units into the skin daily at 12 noon.  07/01/15   Historical Provider, MD  levothyroxine (SYNTHROID, LEVOTHROID) 75 MCG tablet Take 75 mcg by mouth daily before breakfast.     Historical Provider, MD  potassium chloride (KLOR-CON M10) 10 MEQ tablet TAKE 1 TABLET (10 MEQ TOTAL) BY MOUTH DAILY. Patient taking differently: Take 10 mEq by mouth daily after lunch.  09/07/14   Darlin Coco, MD  spironolactone (ALDACTONE) 25 MG tablet 1/2 tablet by mouth daily 08/09/15   Darlin Coco, MD    Allergies  Amlodipine; Contrast media; Hydralazine; Metoprolol; Codeine; Demerol; Librium; and Lipitor  Triage Vitals: BP 205/74 mmHg  Pulse 58  Temp(Src) 98.1 F (36.7 C) (Oral)  Resp 14  Ht 5\' 6"  (1.676 m)  Wt 189 lb 9.6 oz (86.002 kg)  BMI 30.62  kg/m2  SpO2 100%  Physical Exam  Constitutional: She appears well-developed and well-nourished.  HENT:  Head: Normocephalic and atraumatic.  Eyes: Pupils are equal, round, and reactive to light.  Neck: Normal range of motion. Neck supple.  Trachea midline  Cardiovascular: Normal rate and regular rhythm.   Pulmonary/Chest: Effort normal and breath sounds normal. She has no wheezes. She has no rales.  Airway intact  Abdominal: Soft. Bowel sounds are normal. There is no tenderness. There is no rebound and no guarding.  Musculoskeletal: Normal range of motion.  Neurological: She is alert. She has normal reflexes.  Right facial droop Lower DTRs intact Babinski's downgoing on the left, upgoing on the right  Skin: Skin is warm and dry.  Nursing note and vitals reviewed.   ED Course  Procedures   DIAGNOSTIC STUDIES: Oxygen Saturation is 100% on RA, normal by my interpretation.    12:29 AM D/w Dr. Janann Colonel (Neurology)  in the department. Plan is to admit the pt to medicine given her contraindications to TPA administration (Pt had TPA at last admission).   12:44 AM-Consult complete with Dr. Shanon Brow (Hospitalist). Patient case explained and discussed. She agrees to admit patient, inpatient telemetry, for further evaluation and treatment. Call ended at 12:45 AM.  12:52 AM D/w Dr. Janann Colonel who would like the patient's admission held.   2:18 AM D/w Dr. Janann Colonel who recommends admission to Medicine on Step-Down.   Labs Reviewed  CBC - Abnormal; Notable for the following:    WBC 26.7 (*)    All other components within normal limits  I-STAT CHEM 8, ED - Abnormal; Notable for the following:    Chloride 99 (*)    Glucose, Bld 178 (*)    All other components within normal limits  PROTIME-INR  APTT  DIFFERENTIAL  ETHANOL  COMPREHENSIVE METABOLIC PANEL  URINE RAPID DRUG SCREEN, HOSP PERFORMED  URINALYSIS, ROUTINE W REFLEX MICROSCOPIC (NOT AT [facility])  URINALYSIS, ROUTINE W REFLEX MICROSCOPIC (NOT  AT [facility])  Randolm Idol, ED    Imaging Review Ct Head Wo Contrast  08/10/2015   CLINICAL DATA:  Code stroke. Right-sided weakness with right facial droop.  EXAM: CT HEAD WITHOUT CONTRAST  TECHNIQUE: Contiguous axial images were obtained from the base of the skull through the vertex without intravenous contrast.  COMPARISON:  Head CT 07/29/2015, brain MRI 07/30/2015  FINDINGS: No intracranial hemorrhage. Expected evolution with minimal developing hypodensity in the left basal ganglia at site of acute infarct on MRI, no associated edema. No evidence territorial infarct or progressive acute ischemia. Background atrophy and chronic small vessel ischemia is stable in the interim. No cerebral edema, extra-axial fluid collection, mass effect or midline shift. Atherosclerosis again seen of the skullbase vasculature. Tiny mucous retention cyst right maxillary sinus. Calvarium is intact.  IMPRESSION: 1. Expected evolution of the left basal ganglia infarct since prior exam. No interval hemorrhage, mass effect or midline shift. 2. Background chronic small vessel ischemia without CT findings of acute territorial infarct.  These results were called by telephone at the time of interpretation on 08/10/2015 at 12:11 am to Dr. Janann Colonel , who verbally acknowledged these results.   Electronically Signed   By: Jeb Levering M.D.   On: 08/10/2015 00:12    EKG Interpretation  Date/Time:  Wednesday August 10 2015 00:17:19 EDT Ventricular Rate:  57 PR Interval:  187 QRS Duration: 159 QT Interval:  499 QTC Calculation: 486 R Axis:   -29 Text Interpretation:  Sinus rhythm Right bundle branch block Confirmed by [facility]  MD, Emmaline Kluver ([id number]) on 08/10/2015 12:33:40 AM    MDM   Final diagnoses:  CVA (cerebral vascular accident)   Results for orders placed or performed during the hospital encounter of 08/09/15  Ethanol  Result Value Ref Range   Alcohol, Ethyl (B) <5 <5 mg/dL  Protime-INR  Result Value Ref  Range   Prothrombin Time 14.1 11.6 - 15.2 seconds   INR 1.07 0.00 - 1.49  APTT  Result Value Ref Range   aPTT 31 24 - 37 seconds  CBC  Result Value Ref Range   WBC 26.7 (H) 4.0 - 10.5 K/uL   RBC 4.50 3.87 - 5.11 MIL/uL   Hemoglobin 13.5  12.0 - 15.0 g/dL   HCT 41.4 36.0 - 46.0 %   MCV 92.0 78.0 - 100.0 fL   MCH 30.0 26.0 - 34.0 pg   MCHC 32.6 30.0 - 36.0 g/dL   RDW 13.9 11.5 - 15.5 %   Platelets 278 150 - 400 K/uL  Differential  Result Value Ref Range   Neutrophils Relative % 35 %   Lymphocytes Relative 58 %   Monocytes Relative 6 %   Eosinophils Relative 1 %   Basophils Relative 0 %   Neutro Abs 9.3 (H) 1.7 - 7.7 K/uL   Lymphs Abs 15.5 (H) 0.7 - 4.0 K/uL   Monocytes Absolute 1.6 (H) 0.1 - 1.0 K/uL   Eosinophils Absolute 0.3 0.0 - 0.7 K/uL   Basophils Absolute 0.0 0.0 - 0.1 K/uL   WBC Morphology ABSOLUTE LYMPHOCYTOSIS    Smear Review LARGE PLATELETS PRESENT   Comprehensive metabolic panel  Result Value Ref Range   Sodium 134 (L) 135 - 145 mmol/L   Potassium 3.8 3.5 - 5.1 mmol/L   Chloride 101 101 - 111 mmol/L   CO2 22 22 - 32 mmol/L   Glucose, Bld 183 (H) 65 - 99 mg/dL   BUN 15 6 - 20 mg/dL   Creatinine, Ser 0.89 0.44 - 1.00 mg/dL   Calcium 9.7 8.9 - 10.3 mg/dL   Total Protein 6.3 (L) 6.5 - 8.1 g/dL   Albumin 3.4 (L) 3.5 - 5.0 g/dL   AST 24 15 - 41 U/L   ALT 22 14 - 54 U/L   Alkaline Phosphatase 80 38 - 126 U/L   Total Bilirubin 0.6 0.3 - 1.2 mg/dL   GFR calc non Af Amer 56 (L) >60 mL/min   GFR calc Af Amer >60 >60 mL/min   Anion gap 11 5 - 15  Urine rapid drug screen (hosp performed)not at [facility]  Result Value Ref Range   Opiates NONE DETECTED NONE DETECTED   Cocaine NONE DETECTED NONE DETECTED   Benzodiazepines NONE DETECTED NONE DETECTED   Amphetamines NONE DETECTED NONE DETECTED   Tetrahydrocannabinol NONE DETECTED NONE DETECTED   Barbiturates NONE DETECTED NONE DETECTED  Urinalysis, Routine w reflex microscopic (not at [facility])  Result Value Ref Range    Color, Urine YELLOW YELLOW   APPearance CLEAR CLEAR   Specific Gravity, Urine 1.006 1.005 - 1.030   pH 7.5 5.0 - 8.0   Glucose, UA NEGATIVE NEGATIVE mg/dL   Hgb urine dipstick NEGATIVE NEGATIVE   Bilirubin Urine NEGATIVE NEGATIVE   Ketones, ur NEGATIVE NEGATIVE mg/dL   Protein, ur NEGATIVE NEGATIVE mg/dL   Urobilinogen, UA 0.2 0.0 - 1.0 mg/dL   Nitrite NEGATIVE NEGATIVE   Leukocytes, UA NEGATIVE NEGATIVE  I-Stat Chem 8, ED  (not at [facility])  Result Value Ref Range   Sodium 135 135 - 145 mmol/L   Potassium 3.9 3.5 - 5.1 mmol/L   Chloride 99 (L) 101 - 111 mmol/L   BUN 20 6 - 20 mg/dL   Creatinine, Ser 0.80 0.44 - 1.00 mg/dL   Glucose, Bld 178 (H) 65 - 99 mg/dL   Calcium, Ion 1.13 1.13 - 1.30 mmol/L   TCO2 25 0 - 100 mmol/L   Hemoglobin 15.0 12.0 - 15.0 g/dL   HCT 44.0 36.0 - 46.0 %  I-stat troponin, ED (not at [facility])  Result Value Ref Range   Troponin i, poc 0.00 0.00 - 0.08 ng/mL   Comment 3  Ct Abdomen Pelvis Wo Contrast  07/22/2015   CLINICAL DATA:  Abdominal pain with nausea and vomiting.  EXAM: CT ABDOMEN AND PELVIS WITHOUT CONTRAST  TECHNIQUE: Multidetector CT imaging of the abdomen and pelvis was performed following the standard protocol without IV contrast.  COMPARISON:  06/19/2015.  FINDINGS: BODY WALL: Unremarkable.  LOWER CHEST: Unremarkable.  ABDOMEN/PELVIS:  Liver: No focal abnormality.  Biliary: No evidence of biliary obstruction or stone.  Pancreas: Unremarkable.  Spleen: Unremarkable.  Adrenals: Unremarkable.  Kidneys and ureters: No hydronephrosis or stone. Slight perirenal stranding, slightly greater on the RIGHT, is stable.  Bladder: Unremarkable.  Reproductive: Unremarkable.  Bowel: Fecal impaction without frank bowel obstruction. Normal appendix. Sigmoid diverticulosis without diverticulitis.  Retroperitoneum: No solid mass or adenopathy. Stable low-attenuation cystic lesion of the LEFT iliacus, 54 x 67 mm cross-section.  Peritoneum: No free fluid or  gas.  Vascular: Multiple curvilinear calcifications in the peripancreatic region, consistent with aneurysms, stable  OSSEOUS: No acute abnormalities.  LEFT hip prosthesis.  IMPRESSION: Fecal impaction. It is unclear if this is contributing to nausea and vomiting, but does represent change from recent CT. Otherwise no acute intra-abdominal or pelvic findings.   Electronically Signed   By: Staci Righter M.D.   On: 07/22/2015 20:09   Ct Head Wo Contrast  08/10/2015   CLINICAL DATA:  Code stroke. Right-sided weakness with right facial droop.  EXAM: CT HEAD WITHOUT CONTRAST  TECHNIQUE: Contiguous axial images were obtained from the base of the skull through the vertex without intravenous contrast.  COMPARISON:  Head CT 07/29/2015, brain MRI 07/30/2015  FINDINGS: No intracranial hemorrhage. Expected evolution with minimal developing hypodensity in the left basal ganglia at site of acute infarct on MRI, no associated edema. No evidence territorial infarct or progressive acute ischemia. Background atrophy and chronic small vessel ischemia is stable in the interim. No cerebral edema, extra-axial fluid collection, mass effect or midline shift. Atherosclerosis again seen of the skullbase vasculature. Tiny mucous retention cyst right maxillary sinus. Calvarium is intact.  IMPRESSION: 1. Expected evolution of the left basal ganglia infarct since prior exam. No interval hemorrhage, mass effect or midline shift. 2. Background chronic small vessel ischemia without CT findings of acute territorial infarct.  These results were called by telephone at the time of interpretation on 08/10/2015 at 12:11 am to Dr. Janann Colonel , who verbally acknowledged these results.   Electronically Signed   By: Jeb Levering M.D.   On: 08/10/2015 00:12   Ct Head Wo Contrast  07/29/2015   CLINICAL DATA:  Acute onset right-sided weakness  EXAM: CT HEAD WITHOUT CONTRAST  TECHNIQUE: Contiguous axial images were obtained from the base of the skull through  the vertex without intravenous contrast.  COMPARISON:  May 11, 2015  FINDINGS: Mild diffuse atrophy is stable. There is no intracranial mass, hemorrhage, extra-axial fluid collection, or midline shift. There is small vessel disease throughout the centra semiovale bilaterally. There is small vessel disease in both external capsules, stable. Small vessel disease is also noted in the superior left thalamus. No acute infarct evident. The middle cerebral arteries do not show increased attenuation. There is atherosclerotic calcification in the proximal left middle cerebral artery, a stable finding. Bony calvarium appears intact. The mastoid air cells are clear. There are small retention cysts in the right maxillary antrum.  IMPRESSION: Atrophy with extensive supratentorial small vessel disease, stable. No acute infarct evident. No hemorrhage or mass effect. Calcification proximal left middle cerebral artery is a stable finding. There are retention  cysts in the right maxillary antrum.  Critical Value/emergent results were called by telephone at the time of interpretation on 07/29/2015 at 11:18 am to Dr. Nicole Kindred, neurology  , who verbally acknowledged these results.   Electronically Signed   By: Lowella Grip III M.D.   On: 07/29/2015 11:19   Mr Angiogram Neck W Wo Contrast  07/30/2015   CLINICAL DATA:  Awoke from a nap with right-sided weakness. Right facial droop. Treated with tPA. Symptoms began 07/29/2015  EXAM: MRI HEAD WITHOUT AND WITH CONTRAST  MRA HEAD WITHOUT CONTRAST  MRA NECK WITHOUT AND WITH CONTRAST  TECHNIQUE: Multiplanar, multiecho pulse sequences of the brain and surrounding structures were obtained without and with intravenous contrast. Angiographic images of the Circle of Willis were obtained using MRA technique without intravenous contrast. Angiographic images of the neck were obtained using MRA technique without and with intravenous contrast. Carotid stenosis measurements (when applicable) are  obtained utilizing NASCET criteria, using the distal internal carotid diameter as the denominator.  CONTRAST:  81mL MULTIHANCE GADOBENATE DIMEGLUMINE 529 MG/ML IV SOLN  COMPARISON:  Head CT 07/29/2015.  MRI 10/19/2003.  FINDINGS: MRI HEAD FINDINGS  Diffusion imaging shows acute infarction within the left basal ganglia and radiating white matter tracts. No large vessel or cortical infarction. No evidence of swelling or hemorrhage.  There chronic small-vessel ischemic changes affecting the pons and cerebellum. The cerebral hemispheres elsewhere show old small vessel infarctions within the thalami, basal ganglia and throughout the cerebral hemispheric white matter. No cortical or large vessel territory infarction. No mass lesion, hemorrhage, hydrocephalus or extra-axial collection. No pituitary mass. No inflammatory sinus disease. No skull or skullbase lesion. After contrast administration, no abnormal enhancement occurs.  MRA HEAD FINDINGS  Both internal carotid arteries are patent through the skullbase. The siphon regions are patent, but there is pronounced atherosclerotic irregularity bilaterally. Maximal stenosis in the siphon regions measures approximately 30%. There is severe stenosis in the supraclinoid internal carotid arteries bilaterally, worse on the left than the right. Stenosis on the left is 80% or greater. Stenosis on the right is 60-70% beyond that, the anterior and middle cerebral vessels are patent without dominant stenosis. The more distal vessels show narrowing and irregularity consistent with diffuse intracranial atherosclerotic disease.  Both vertebral arteries are patent but show narrowing and irregularity throughout their course consistent with diffuse atherosclerosis. There is no stenosis greater than 50% affecting either distal vertebral artery. The basilar artery shows narrowing and irregularity with a proximal stenosis measured at 50%. Superior cerebellar and posterior cerebral artery  vessels are patent, but show diffuse atherosclerotic irregularity and narrowing.  MRA NECK FINDINGS  Branching pattern of the brachiocephalic vessels from the arch is normal with a common origin of the innominate artery and left common carotid artery. Origin stenosis. Both common carotid arteries are widely patent to the bifurcations. There is mild atherosclerotic disease at both carotid bifurcations but no stenosis. Both cervical internal carotid arteries are widely patent.  Both vertebral arteries are patent with the right being larger than the left. There is 30-50% stenosis at the right vertebral artery origin and at the left vertebral artery origin. Beyond that, the vessels appear widely patent through the cervical region.  IMPRESSION: Acute infarction affecting the left basal ganglia and radiating white matter tracts. No swelling or hemorrhage.  Extensive chronic small vessel ischemic changes elsewhere throughout the brain.  No carotid bifurcation disease. 30-50% stenoses at both vertebral artery origins.  Severe atherosclerotic disease intracranially. Advanced disease in the siphon  regions and supraclinoid internal carotid arteries with stenosis of the left supraclinoid internal carotid artery of 80% or greater. Stenosis on the right is 60-70%. Approximately 50% stenoses in the distal vertebral arteries and in the proximal basilar artery.  Diffuse atherosclerotic narrowing and irregularity in the smaller intracranial branches.   Electronically Signed   By: Nelson Chimes M.D.   On: 07/30/2015 13:51   Mr Jeri Cos QA Contrast  07/30/2015   CLINICAL DATA:  Awoke from a nap with right-sided weakness. Right facial droop. Treated with tPA. Symptoms began 07/29/2015  EXAM: MRI HEAD WITHOUT AND WITH CONTRAST  MRA HEAD WITHOUT CONTRAST  MRA NECK WITHOUT AND WITH CONTRAST  TECHNIQUE: Multiplanar, multiecho pulse sequences of the brain and surrounding structures were obtained without and with intravenous contrast.  Angiographic images of the Circle of Willis were obtained using MRA technique without intravenous contrast. Angiographic images of the neck were obtained using MRA technique without and with intravenous contrast. Carotid stenosis measurements (when applicable) are obtained utilizing NASCET criteria, using the distal internal carotid diameter as the denominator.  CONTRAST:  86mL MULTIHANCE GADOBENATE DIMEGLUMINE 529 MG/ML IV SOLN  COMPARISON:  Head CT 07/29/2015.  MRI 10/19/2003.  FINDINGS: MRI HEAD FINDINGS  Diffusion imaging shows acute infarction within the left basal ganglia and radiating white matter tracts. No large vessel or cortical infarction. No evidence of swelling or hemorrhage.  There chronic small-vessel ischemic changes affecting the pons and cerebellum. The cerebral hemispheres elsewhere show old small vessel infarctions within the thalami, basal ganglia and throughout the cerebral hemispheric white matter. No cortical or large vessel territory infarction. No mass lesion, hemorrhage, hydrocephalus or extra-axial collection. No pituitary mass. No inflammatory sinus disease. No skull or skullbase lesion. After contrast administration, no abnormal enhancement occurs.  MRA HEAD FINDINGS  Both internal carotid arteries are patent through the skullbase. The siphon regions are patent, but there is pronounced atherosclerotic irregularity bilaterally. Maximal stenosis in the siphon regions measures approximately 30%. There is severe stenosis in the supraclinoid internal carotid arteries bilaterally, worse on the left than the right. Stenosis on the left is 80% or greater. Stenosis on the right is 60-70% beyond that, the anterior and middle cerebral vessels are patent without dominant stenosis. The more distal vessels show narrowing and irregularity consistent with diffuse intracranial atherosclerotic disease.  Both vertebral arteries are patent but show narrowing and irregularity throughout their course  consistent with diffuse atherosclerosis. There is no stenosis greater than 50% affecting either distal vertebral artery. The basilar artery shows narrowing and irregularity with a proximal stenosis measured at 50%. Superior cerebellar and posterior cerebral artery vessels are patent, but show diffuse atherosclerotic irregularity and narrowing.  MRA NECK FINDINGS  Branching pattern of the brachiocephalic vessels from the arch is normal with a common origin of the innominate artery and left common carotid artery. Origin stenosis. Both common carotid arteries are widely patent to the bifurcations. There is mild atherosclerotic disease at both carotid bifurcations but no stenosis. Both cervical internal carotid arteries are widely patent.  Both vertebral arteries are patent with the right being larger than the left. There is 30-50% stenosis at the right vertebral artery origin and at the left vertebral artery origin. Beyond that, the vessels appear widely patent through the cervical region.  IMPRESSION: Acute infarction affecting the left basal ganglia and radiating white matter tracts. No swelling or hemorrhage.  Extensive chronic small vessel ischemic changes elsewhere throughout the brain.  No carotid bifurcation disease. 30-50% stenoses at both  vertebral artery origins.  Severe atherosclerotic disease intracranially. Advanced disease in the siphon regions and supraclinoid internal carotid arteries with stenosis of the left supraclinoid internal carotid artery of 80% or greater. Stenosis on the right is 60-70%. Approximately 50% stenoses in the distal vertebral arteries and in the proximal basilar artery.  Diffuse atherosclerotic narrowing and irregularity in the smaller intracranial branches.   Electronically Signed   By: Nelson Chimes M.D.   On: 07/30/2015 13:51   Dg Chest Portable 1 View  08/10/2015   CLINICAL DATA:  Weakness.  EXAM: PORTABLE CHEST 1 VIEW  COMPARISON:  07/29/2015  FINDINGS: Lung volumes are low.  The cardiomediastinal contours are unchanged. Pulmonary vasculature is normal. No consolidation, pleural effusion, or pneumothorax. No acute osseous abnormalities are seen. Remote left rib fracture is unchanged.  IMPRESSION: No acute pulmonary process.   Electronically Signed   By: Jeb Levering M.D.   On: 08/10/2015 02:27   Dg Chest Port 1 View  07/29/2015   CLINICAL DATA:  Code stroke, elevated white blood count  EXAM: PORTABLE CHEST - 1 VIEW  COMPARISON:  07/22/2015  FINDINGS: Normal heart size and vascular pattern. Stable aortic calcification. Nonacute appearing left sixth rib fracture. Minimal bibasilar atelectasis.  IMPRESSION: No active disease.   Electronically Signed   By: Skipper Cliche M.D.   On: 07/29/2015 12:35   Dg Abd Acute W/chest  07/22/2015   CLINICAL DATA:  Nausea and vomiting for 2 days, recurrent urinary tract infection  EXAM: DG ABDOMEN ACUTE W/ 1V CHEST  COMPARISON:  None.  FINDINGS: Mild cardiac enlargement. Calcification of the aorta. Lungs clear except for mild left base atelectasis. No free air. No abnormally dilated loops of bowel. Gas and stool into the rectum. No abnormal opacities.  IMPRESSION: No acute abnormalities.   Electronically Signed   By: Skipper Cliche M.D.   On: 07/22/2015 16:22   Mr Jodene Nam Head/brain Wo Cm  07/30/2015   CLINICAL DATA:  Awoke from a nap with right-sided weakness. Right facial droop. Treated with tPA. Symptoms began 07/29/2015  EXAM: MRI HEAD WITHOUT AND WITH CONTRAST  MRA HEAD WITHOUT CONTRAST  MRA NECK WITHOUT AND WITH CONTRAST  TECHNIQUE: Multiplanar, multiecho pulse sequences of the brain and surrounding structures were obtained without and with intravenous contrast. Angiographic images of the Circle of Willis were obtained using MRA technique without intravenous contrast. Angiographic images of the neck were obtained using MRA technique without and with intravenous contrast. Carotid stenosis measurements (when applicable) are obtained utilizing  NASCET criteria, using the distal internal carotid diameter as the denominator.  CONTRAST:  29mL MULTIHANCE GADOBENATE DIMEGLUMINE 529 MG/ML IV SOLN  COMPARISON:  Head CT 07/29/2015.  MRI 10/19/2003.  FINDINGS: MRI HEAD FINDINGS  Diffusion imaging shows acute infarction within the left basal ganglia and radiating white matter tracts. No large vessel or cortical infarction. No evidence of swelling or hemorrhage.  There chronic small-vessel ischemic changes affecting the pons and cerebellum. The cerebral hemispheres elsewhere show old small vessel infarctions within the thalami, basal ganglia and throughout the cerebral hemispheric white matter. No cortical or large vessel territory infarction. No mass lesion, hemorrhage, hydrocephalus or extra-axial collection. No pituitary mass. No inflammatory sinus disease. No skull or skullbase lesion. After contrast administration, no abnormal enhancement occurs.  MRA HEAD FINDINGS  Both internal carotid arteries are patent through the skullbase. The siphon regions are patent, but there is pronounced atherosclerotic irregularity bilaterally. Maximal stenosis in the siphon regions measures approximately 30%. There is severe stenosis  in the supraclinoid internal carotid arteries bilaterally, worse on the left than the right. Stenosis on the left is 80% or greater. Stenosis on the right is 60-70% beyond that, the anterior and middle cerebral vessels are patent without dominant stenosis. The more distal vessels show narrowing and irregularity consistent with diffuse intracranial atherosclerotic disease.  Both vertebral arteries are patent but show narrowing and irregularity throughout their course consistent with diffuse atherosclerosis. There is no stenosis greater than 50% affecting either distal vertebral artery. The basilar artery shows narrowing and irregularity with a proximal stenosis measured at 50%. Superior cerebellar and posterior cerebral artery vessels are patent, but  show diffuse atherosclerotic irregularity and narrowing.  MRA NECK FINDINGS  Branching pattern of the brachiocephalic vessels from the arch is normal with a common origin of the innominate artery and left common carotid artery. Origin stenosis. Both common carotid arteries are widely patent to the bifurcations. There is mild atherosclerotic disease at both carotid bifurcations but no stenosis. Both cervical internal carotid arteries are widely patent.  Both vertebral arteries are patent with the right being larger than the left. There is 30-50% stenosis at the right vertebral artery origin and at the left vertebral artery origin. Beyond that, the vessels appear widely patent through the cervical region.  IMPRESSION: Acute infarction affecting the left basal ganglia and radiating white matter tracts. No swelling or hemorrhage.  Extensive chronic small vessel ischemic changes elsewhere throughout the brain.  No carotid bifurcation disease. 30-50% stenoses at both vertebral artery origins.  Severe atherosclerotic disease intracranially. Advanced disease in the siphon regions and supraclinoid internal carotid arteries with stenosis of the left supraclinoid internal carotid artery of 80% or greater. Stenosis on the right is 60-70%. Approximately 50% stenoses in the distal vertebral arteries and in the proximal basilar artery.  Diffuse atherosclerotic narrowing and irregularity in the smaller intracranial branches.   Electronically Signed   By: Nelson Chimes M.D.   On: 07/30/2015 13:51    Medications  hydrALAZINE (APRESOLINE) 20 MG/ML injection (  Return to [facility] 08/10/15 0235)    Admit to medicine step down per Dr. Shanon Brow   I personally performed the services described in this documentation, which was scribed in my presence. The recorded information has been reviewed and is accurate.     Veatrice Kells, MD 08/10/15 [phone]

## 2015-08-09 NOTE — Telephone Encounter (Signed)
Okay keep the atenolol at TID.  Add spironolactone 12.5 daily.  Recheck a BMET early next week.  (she has a history of low potassium so spironolactone may work for her.)

## 2015-08-09 NOTE — Patient Outreach (Signed)
[facility]) Care Management  08/09/2015  Molly [name] [birth year] [id number]   Request from Mariann Laster, RN to assign SW and Pharmacy, assigned Eula Fried, LCSW and Deanne Coffer, PharmD.  Thanks, Ronnell Freshwater. [place], [place] Assistant Phone: [phone] Fax: [phone]

## 2015-08-10 ENCOUNTER — Inpatient Hospital Stay (HOSPITAL_COMMUNITY): Payer: Medicare Other

## 2015-08-10 ENCOUNTER — Emergency Department (HOSPITAL_COMMUNITY): Payer: Medicare Other

## 2015-08-10 ENCOUNTER — Encounter (HOSPITAL_COMMUNITY): Payer: Self-pay

## 2015-08-10 ENCOUNTER — Telehealth: Payer: Self-pay | Admitting: Cardiology

## 2015-08-10 ENCOUNTER — Telehealth: Payer: Self-pay | Admitting: Nurse Practitioner

## 2015-08-10 ENCOUNTER — Other Ambulatory Visit (HOSPITAL_COMMUNITY): Payer: Medicare Other

## 2015-08-10 ENCOUNTER — Telehealth: Payer: Self-pay | Admitting: *Deleted

## 2015-08-10 DIAGNOSIS — I350 Nonrheumatic aortic (valve) stenosis: Secondary | ICD-10-CM | POA: Diagnosis present

## 2015-08-10 DIAGNOSIS — Z794 Long term (current) use of insulin: Secondary | ICD-10-CM | POA: Diagnosis not present

## 2015-08-10 DIAGNOSIS — I638 Other cerebral infarction: Secondary | ICD-10-CM | POA: Diagnosis not present

## 2015-08-10 DIAGNOSIS — I63032 Cerebral infarction due to thrombosis of left carotid artery: Secondary | ICD-10-CM | POA: Diagnosis not present

## 2015-08-10 DIAGNOSIS — I639 Cerebral infarction, unspecified: Secondary | ICD-10-CM

## 2015-08-10 DIAGNOSIS — R0989 Other specified symptoms and signs involving the circulatory and respiratory systems: Secondary | ICD-10-CM | POA: Diagnosis present

## 2015-08-10 DIAGNOSIS — Z79811 Long term (current) use of aromatase inhibitors: Secondary | ICD-10-CM | POA: Diagnosis not present

## 2015-08-10 DIAGNOSIS — I10 Essential (primary) hypertension: Secondary | ICD-10-CM | POA: Diagnosis not present

## 2015-08-10 DIAGNOSIS — Z888 Allergy status to other drugs, medicaments and biological substances status: Secondary | ICD-10-CM | POA: Diagnosis not present

## 2015-08-10 DIAGNOSIS — C9110 Chronic lymphocytic leukemia of B-cell type not having achieved remission: Secondary | ICD-10-CM

## 2015-08-10 DIAGNOSIS — I6522 Occlusion and stenosis of left carotid artery: Secondary | ICD-10-CM | POA: Diagnosis not present

## 2015-08-10 DIAGNOSIS — I119 Hypertensive heart disease without heart failure: Secondary | ICD-10-CM | POA: Diagnosis not present

## 2015-08-10 DIAGNOSIS — E669 Obesity, unspecified: Secondary | ICD-10-CM | POA: Diagnosis present

## 2015-08-10 DIAGNOSIS — E1140 Type 2 diabetes mellitus with diabetic neuropathy, unspecified: Secondary | ICD-10-CM | POA: Diagnosis not present

## 2015-08-10 DIAGNOSIS — E860 Dehydration: Secondary | ICD-10-CM | POA: Diagnosis present

## 2015-08-10 DIAGNOSIS — Z961 Presence of intraocular lens: Secondary | ICD-10-CM | POA: Diagnosis present

## 2015-08-10 DIAGNOSIS — R579 Shock, unspecified: Secondary | ICD-10-CM

## 2015-08-10 DIAGNOSIS — Z79899 Other long term (current) drug therapy: Secondary | ICD-10-CM | POA: Diagnosis not present

## 2015-08-10 DIAGNOSIS — E7800 Pure hypercholesterolemia, unspecified: Secondary | ICD-10-CM | POA: Diagnosis not present

## 2015-08-10 DIAGNOSIS — E785 Hyperlipidemia, unspecified: Secondary | ICD-10-CM | POA: Diagnosis present

## 2015-08-10 DIAGNOSIS — Z8673 Personal history of transient ischemic attack (TIA), and cerebral infarction without residual deficits: Secondary | ICD-10-CM | POA: Diagnosis not present

## 2015-08-10 DIAGNOSIS — I4510 Unspecified right bundle-branch block: Secondary | ICD-10-CM | POA: Diagnosis present

## 2015-08-10 DIAGNOSIS — Z7982 Long term (current) use of aspirin: Secondary | ICD-10-CM | POA: Diagnosis not present

## 2015-08-10 DIAGNOSIS — R29810 Facial weakness: Secondary | ICD-10-CM | POA: Diagnosis present

## 2015-08-10 DIAGNOSIS — Z91041 Radiographic dye allergy status: Secondary | ICD-10-CM | POA: Diagnosis not present

## 2015-08-10 DIAGNOSIS — Z9841 Cataract extraction status, right eye: Secondary | ICD-10-CM | POA: Diagnosis not present

## 2015-08-10 DIAGNOSIS — Z9842 Cataract extraction status, left eye: Secondary | ICD-10-CM | POA: Diagnosis not present

## 2015-08-10 DIAGNOSIS — I6523 Occlusion and stenosis of bilateral carotid arteries: Secondary | ICD-10-CM | POA: Diagnosis present

## 2015-08-10 DIAGNOSIS — R339 Retention of urine, unspecified: Secondary | ICD-10-CM | POA: Diagnosis present

## 2015-08-10 DIAGNOSIS — C50911 Malignant neoplasm of unspecified site of right female breast: Secondary | ICD-10-CM | POA: Diagnosis present

## 2015-08-10 DIAGNOSIS — F419 Anxiety disorder, unspecified: Secondary | ICD-10-CM | POA: Diagnosis present

## 2015-08-10 DIAGNOSIS — R001 Bradycardia, unspecified: Secondary | ICD-10-CM | POA: Diagnosis present

## 2015-08-10 DIAGNOSIS — I341 Nonrheumatic mitral (valve) prolapse: Secondary | ICD-10-CM | POA: Diagnosis present

## 2015-08-10 DIAGNOSIS — K625 Hemorrhage of anus and rectum: Secondary | ICD-10-CM | POA: Diagnosis not present

## 2015-08-10 DIAGNOSIS — Z993 Dependence on wheelchair: Secondary | ICD-10-CM | POA: Diagnosis not present

## 2015-08-10 DIAGNOSIS — Z90710 Acquired absence of both cervix and uterus: Secondary | ICD-10-CM | POA: Diagnosis not present

## 2015-08-10 DIAGNOSIS — D729 Disorder of white blood cells, unspecified: Secondary | ICD-10-CM | POA: Diagnosis not present

## 2015-08-10 DIAGNOSIS — Z885 Allergy status to narcotic agent status: Secondary | ICD-10-CM | POA: Diagnosis not present

## 2015-08-10 DIAGNOSIS — I63512 Cerebral infarction due to unspecified occlusion or stenosis of left middle cerebral artery: Secondary | ICD-10-CM | POA: Diagnosis not present

## 2015-08-10 DIAGNOSIS — K649 Unspecified hemorrhoids: Secondary | ICD-10-CM | POA: Diagnosis present

## 2015-08-10 DIAGNOSIS — I63519 Cerebral infarction due to unspecified occlusion or stenosis of unspecified middle cerebral artery: Secondary | ICD-10-CM | POA: Diagnosis present

## 2015-08-10 DIAGNOSIS — C7951 Secondary malignant neoplasm of bone: Secondary | ICD-10-CM | POA: Diagnosis present

## 2015-08-10 DIAGNOSIS — G8191 Hemiplegia, unspecified affecting right dominant side: Secondary | ICD-10-CM | POA: Diagnosis present

## 2015-08-10 DIAGNOSIS — R031 Nonspecific low blood-pressure reading: Secondary | ICD-10-CM | POA: Diagnosis present

## 2015-08-10 DIAGNOSIS — Z96641 Presence of right artificial hip joint: Secondary | ICD-10-CM | POA: Diagnosis present

## 2015-08-10 DIAGNOSIS — R471 Dysarthria and anarthria: Secondary | ICD-10-CM | POA: Diagnosis present

## 2015-08-10 DIAGNOSIS — E039 Hypothyroidism, unspecified: Secondary | ICD-10-CM | POA: Diagnosis present

## 2015-08-10 DIAGNOSIS — E1142 Type 2 diabetes mellitus with diabetic polyneuropathy: Secondary | ICD-10-CM | POA: Diagnosis present

## 2015-08-10 DIAGNOSIS — I6359 Cerebral infarction due to unspecified occlusion or stenosis of other cerebral artery: Secondary | ICD-10-CM | POA: Diagnosis not present

## 2015-08-10 DIAGNOSIS — I998 Other disorder of circulatory system: Secondary | ICD-10-CM | POA: Diagnosis present

## 2015-08-10 DIAGNOSIS — Z6831 Body mass index (BMI) 31.0-31.9, adult: Secondary | ICD-10-CM | POA: Diagnosis not present

## 2015-08-10 DIAGNOSIS — K922 Gastrointestinal hemorrhage, unspecified: Secondary | ICD-10-CM | POA: Diagnosis not present

## 2015-08-10 DIAGNOSIS — M1990 Unspecified osteoarthritis, unspecified site: Secondary | ICD-10-CM | POA: Diagnosis present

## 2015-08-10 LAB — CBC
HCT: 41.4 % (ref 36.0–46.0)
Hemoglobin: 13.5 g/dL (ref 12.0–15.0)
MCH: 30 pg (ref 26.0–34.0)
MCHC: 32.6 g/dL (ref 30.0–36.0)
MCV: 92 fL (ref 78.0–100.0)
PLATELETS: 278 10*3/uL (ref 150–400)
RBC: 4.5 MIL/uL (ref 3.87–5.11)
RDW: 13.9 % (ref 11.5–15.5)
WBC: 26.7 10*3/uL — ABNORMAL HIGH (ref 4.0–10.5)

## 2015-08-10 LAB — COMPREHENSIVE METABOLIC PANEL
ALT: 22 U/L (ref 14–54)
AST: 24 U/L (ref 15–41)
Albumin: 3.4 g/dL — ABNORMAL LOW (ref 3.5–5.0)
Alkaline Phosphatase: 80 U/L (ref 38–126)
Anion gap: 11 (ref 5–15)
BILIRUBIN TOTAL: 0.6 mg/dL (ref 0.3–1.2)
BUN: 15 mg/dL (ref 6–20)
CHLORIDE: 101 mmol/L (ref 101–111)
CO2: 22 mmol/L (ref 22–32)
Calcium: 9.7 mg/dL (ref 8.9–10.3)
Creatinine, Ser: 0.89 mg/dL (ref 0.44–1.00)
GFR, EST NON AFRICAN AMERICAN: 56 mL/min — AB (ref >60)
Glucose, Bld: 183 mg/dL — ABNORMAL HIGH (ref 65–99)
POTASSIUM: 3.8 mmol/L (ref 3.5–5.1)
Sodium: 134 mmol/L — ABNORMAL LOW (ref 135–145)
TOTAL PROTEIN: 6.3 g/dL — AB (ref 6.5–8.1)

## 2015-08-10 LAB — DIFFERENTIAL
BASOS PCT: 0 %
Basophils Absolute: 0 10*3/uL (ref 0.0–0.1)
EOS ABS: 0.3 10*3/uL (ref 0.0–0.7)
EOS PCT: 1 %
LYMPHS ABS: 15.5 10*3/uL — AB (ref 0.7–4.0)
Lymphocytes Relative: 58 %
MONO ABS: 1.6 10*3/uL — AB (ref 0.1–1.0)
Monocytes Relative: 6 %
NEUTROS ABS: 9.3 10*3/uL — AB (ref 1.7–7.7)
NEUTROS PCT: 35 %

## 2015-08-10 LAB — RAPID URINE DRUG SCREEN, HOSP PERFORMED
Amphetamines: NOT DETECTED
BARBITURATES: NOT DETECTED
BENZODIAZEPINES: NOT DETECTED
COCAINE: NOT DETECTED
Opiates: NOT DETECTED
Tetrahydrocannabinol: NOT DETECTED

## 2015-08-10 LAB — URINALYSIS, ROUTINE W REFLEX MICROSCOPIC
Bilirubin Urine: NEGATIVE
Glucose, UA: NEGATIVE mg/dL
Hgb urine dipstick: NEGATIVE
KETONES UR: NEGATIVE mg/dL
LEUKOCYTES UA: NEGATIVE
NITRITE: NEGATIVE
PH: 7.5 (ref 5.0–8.0)
PROTEIN: NEGATIVE mg/dL
Specific Gravity, Urine: 1.006 (ref 1.005–1.030)
Urobilinogen, UA: 0.2 mg/dL (ref 0.0–1.0)

## 2015-08-10 LAB — PROTIME-INR
INR: 1.07 (ref 0.00–1.49)
PROTHROMBIN TIME: 14.1 s (ref 11.6–15.2)

## 2015-08-10 LAB — GLUCOSE, CAPILLARY
Glucose-Capillary: 175 mg/dL — ABNORMAL HIGH (ref 65–99)
Glucose-Capillary: 151 mg/dL — ABNORMAL HIGH (ref 65–99)
Glucose-Capillary: 149 mg/dL — ABNORMAL HIGH (ref 65–99)
Glucose-Capillary: 178 mg/dL — ABNORMAL HIGH (ref 65–99)
Glucose-Capillary: 171 mg/dL — ABNORMAL HIGH (ref 65–99)

## 2015-08-10 LAB — I-STAT CHEM 8, ED
BUN: 20 mg/dL (ref 6–20)
CALCIUM ION: 1.13 mmol/L (ref 1.13–1.30)
Chloride: 99 mmol/L — ABNORMAL LOW (ref 101–111)
Creatinine, Ser: 0.8 mg/dL (ref 0.44–1.00)
Glucose, Bld: 178 mg/dL — ABNORMAL HIGH (ref 65–99)
HEMATOCRIT: 44 % (ref 36.0–46.0)
Hemoglobin: 15 g/dL (ref 12.0–15.0)
Potassium: 3.9 mmol/L (ref 3.5–5.1)
SODIUM: 135 mmol/L (ref 135–145)
TCO2: 25 mmol/L (ref 0–100)

## 2015-08-10 LAB — I-STAT TROPONIN, ED: TROPONIN I, POC: 0 ng/mL (ref 0.00–0.08)

## 2015-08-10 LAB — PATHOLOGIST SMEAR REVIEW

## 2015-08-10 LAB — PROCALCITONIN: Procalcitonin: <0.1 ng/mL

## 2015-08-10 LAB — ETHANOL

## 2015-08-10 LAB — TROPONIN I
Troponin I: <0.03 ng/mL (ref <0.031)
Troponin I: <0.03 ng/mL (ref <0.031)

## 2015-08-10 LAB — APTT: aPTT: 31 seconds (ref 24–37)

## 2015-08-10 MED ORDER — ASPIRIN EC 81 MG PO TBEC
81.0000 mg | DELAYED_RELEASE_TABLET | Freq: Every day | ORAL | Status: DC
  Administered 2015-08-10 – 2015-08-14 (×5): 81 mg via ORAL
  Filled 2015-08-10 (×6): qty 1

## 2015-08-10 MED ORDER — BISACODYL 10 MG RE SUPP
10.0000 mg | Freq: Once | RECTAL | Status: AC
  Administered 2015-08-10: 10 mg via RECTAL
  Filled 2015-08-10: qty 1

## 2015-08-10 MED ORDER — STROKE: EARLY STAGES OF RECOVERY BOOK
Freq: Once | Status: AC
  Administered 2015-08-10: 04:00:00
  Filled 2015-08-10: qty 1

## 2015-08-10 MED ORDER — LEVOTHYROXINE SODIUM 50 MCG PO TABS
75.0000 ug | ORAL_TABLET | Freq: Every day | ORAL | Status: DC
  Administered 2015-08-10 – 2015-08-18 (×9): 75 ug via ORAL
  Filled 2015-08-10 (×17): qty 1

## 2015-08-10 MED ORDER — DIPHENHYDRAMINE HCL 50 MG/ML IJ SOLN
INTRAMUSCULAR | Status: AC
  Administered 2015-08-10: 25 mg via INTRAVENOUS
  Filled 2015-08-10: qty 1

## 2015-08-10 MED ORDER — EZETIMIBE 10 MG PO TABS
10.0000 mg | ORAL_TABLET | Freq: Every day | ORAL | Status: DC
  Administered 2015-08-10 – 2015-08-18 (×9): 10 mg via ORAL
  Filled 2015-08-10 (×11): qty 1

## 2015-08-10 MED ORDER — DOCUSATE SODIUM 100 MG PO CAPS: 200.0000 mg | ORAL_CAPSULE | Freq: Two times a day (BID) | ORAL | Status: DC | PRN

## 2015-08-10 MED ORDER — CYANOCOBALAMIN 500 MCG PO TABS
500.0000 ug | ORAL_TABLET | Freq: Every day | ORAL | Status: DC
  Administered 2015-08-10 – 2015-08-18 (×8): 500 ug via ORAL
  Filled 2015-08-10 (×10): qty 1

## 2015-08-10 MED ORDER — INSULIN ASPART 100 UNIT/ML ~~LOC~~ SOLN
0.0000 [IU] | Freq: Three times a day (TID) | SUBCUTANEOUS | Status: DC
  Administered 2015-08-10 – 2015-08-12 (×6): 2 [IU] via SUBCUTANEOUS
  Administered 2015-08-12: 3 [IU] via SUBCUTANEOUS
  Administered 2015-08-12 – 2015-08-13 (×3): 2 [IU] via SUBCUTANEOUS
  Administered 2015-08-13: 1 [IU] via SUBCUTANEOUS
  Administered 2015-08-13: 5 [IU] via SUBCUTANEOUS
  Administered 2015-08-13 – 2015-08-14 (×4): 2 [IU] via SUBCUTANEOUS
  Administered 2015-08-14 – 2015-08-15 (×3): 1 [IU] via SUBCUTANEOUS
  Administered 2015-08-15 (×2): 2 [IU] via SUBCUTANEOUS
  Administered 2015-08-16 (×2): 1 [IU] via SUBCUTANEOUS
  Administered 2015-08-16: 2 [IU] via SUBCUTANEOUS
  Administered 2015-08-16: 1 [IU] via SUBCUTANEOUS
  Administered 2015-08-17: 2 [IU] via SUBCUTANEOUS
  Administered 2015-08-17 – 2015-08-18 (×3): 1 [IU] via SUBCUTANEOUS
  Administered 2015-08-18 (×2): 2 [IU] via SUBCUTANEOUS

## 2015-08-10 MED ORDER — CLOPIDOGREL BISULFATE 75 MG PO TABS
75.0000 mg | ORAL_TABLET | Freq: Every day | ORAL | Status: DC
Start: 2015-08-10 — End: 2015-08-15
  Administered 2015-08-10 – 2015-08-14 (×5): 75 mg via ORAL
  Filled 2015-08-10 (×6): qty 1

## 2015-08-10 MED ORDER — DOCUSATE SODIUM 100 MG PO CAPS
200.0000 mg | ORAL_CAPSULE | Freq: Two times a day (BID) | ORAL | Status: DC
  Administered 2015-08-10 – 2015-08-17 (×15): 200 mg via ORAL
  Administered 2015-08-17: 100 mg via ORAL
  Administered 2015-08-18: 200 mg via ORAL
  Filled 2015-08-10 (×21): qty 2

## 2015-08-10 MED ORDER — HYDRALAZINE HCL 20 MG/ML IJ SOLN
INTRAMUSCULAR | Status: AC
  Filled 2015-08-10: qty 1

## 2015-08-10 MED ORDER — DIPHENHYDRAMINE HCL 50 MG/ML IJ SOLN
INTRAMUSCULAR | Status: AC
  Filled 2015-08-10: qty 1

## 2015-08-10 MED ORDER — INSULIN ASPART 100 UNIT/ML ~~LOC~~ SOLN
0.0000 [IU] | SUBCUTANEOUS | Status: DC
  Administered 2015-08-10: 2 [IU] via SUBCUTANEOUS
  Administered 2015-08-10: 1 [IU] via SUBCUTANEOUS
  Administered 2015-08-10 (×2): 2 [IU] via SUBCUTANEOUS

## 2015-08-10 NOTE — Consult Note (Signed)
PULMONARY / CRITICAL CARE MEDICINE   Name: Molly [name] MRN: [id number] DOB: [birth year]    ADMISSION DATE:  08/09/2015 CONSULTATION DATE:  08/10/2015  REFERRING MD :  Family Medicine Service  CHIEF COMPLAINT: Hypotension   INITIAL PRESENTATION: 81 year old female with recent admission and discharge for acute infarction of left basal ganglia on 9/16 status post TPA with complete resolution of right-sided hemiparesis. Patient discharged on aspirin & Plavix. Patient reportedly awoke after going to bed around 11 PM with right-sided weakness & right facial droop.  STUDIES:  Portable CXR 9/28 - reviewed by me. Low lung volumes. No obvious opacity or pleural effusion. Mediastinum and heart normal in contour.   CT HEAD W/O 9/28 - no intracranial hemorrhage. Evolution of left basal ganglia infarct. No mass effect or midline shift.  SIGNIFICANT EVENTS: 9/28 - Admitted to hospital 9/28 - Transferred to ICU w/ transient hypotension & worsening neuro status   HISTORY OF PRESENT ILLNESS:  81 year old Caucasian female with recent admission for left basal ganglia infarct status post TPA. Patient readmitted for worsening neuro status. She awoke out of sleep with her acute changes. Denies any headache. Denies any chest pain or pressure. Denies any dyspnea or cough. Patient had transient hypotension with worsening of her neuro status earlier this morning. This resolved after a 500 mL fluid bolus. Denies any subjective fever, chills, or sweats. Patient had no sedating medications or antihypertensives before her decompensation. Transferred to the intensive care unit for closer monitoring & workup.  PAST MEDICAL HISTORY :   has a past medical history of Hypertension; MVP (mitral valve prolapse); Hypothyroidism; Anemia; Hypercholesterolemia; Heart murmur; Type II diabetes mellitus; Arthritis; and Breast cancer, right breast.  has past surgical history that includes Hemiarthroplasty hip (Right); Pelvic and  para-aortic lymph node dissection; Tonsillectomy; Appendectomy; Cholecystectomy; US ECHOCARDIOGRAPHY (03/30/2009); Cardiovascular stress test (01/28/2008); Esophagogastroduodenoscopy (N/A, 07/08/2014); Colonoscopy (N/A, 07/10/2014); Total abdominal hysterectomy; Dilation and curettage of uterus; Breast biopsy (Right, 2009); Breast lumpectomy (Right, 2009); Fracture surgery; and Cataract extraction w/ intraocular lens  implant, bilateral (Bilateral). Prior to Admission medications   Medication Sig Start Date End Date Taking? Authorizing Provider  acetaminophen (TYLENOL) 500 MG tablet Take 500 mg by mouth every 6 (six) hours as needed (pain).   Yes Historical Provider, MD  amLODipine-valsartan (EXFORGE) 10-320 MG per tablet Take 1 tablet by mouth daily. 03/22/15  Yes Darlin Coco, MD  anastrozole (ARIMIDEX) 1 MG tablet TAKE 1 TABLET BY MOUTH EVERY DAY 02/28/15  Yes Ladell Pier, MD  aspirin EC 81 MG tablet Take 81 mg by mouth daily.   Yes Historical Provider, MD  atenolol (TENORMIN) 50 MG tablet Take 1 tablet (50 mg total) by mouth 3 (three) times daily. 07/06/15  Yes Darlin Coco, MD  Calcium Carbonate-Vitamin D (CALCIUM + D PO) Take 1 tablet by mouth daily after lunch.    Yes Historical Provider, MD  cephALEXin (KEFLEX) 500 MG capsule Take 1 capsule (500 mg total) by mouth every 12 (twelve) hours. 08/02/15  Yes David L Rinehuls, PA-C  cholecalciferol (VITAMIN D) 1000 UNITS tablet Take 1,000 Units by mouth daily after lunch.    Yes Historical Provider, MD  cloNIDine (CATAPRES) 0.1 MG tablet Take 1 tablet (0.1 mg total) by mouth 2 (two) times daily. 08/02/15  Yes David L Rinehuls, PA-C  clopidogrel (PLAVIX) 75 MG tablet Take 1 tablet (75 mg total) by mouth daily. 08/02/15  Yes David L Rinehuls, PA-C  Cyanocobalamin (B-12) 500 MCG TABS Take 500 mcg by mouth daily after  lunch.    Yes Historical Provider, MD  docusate sodium (COLACE) 100 MG capsule Take 2 capsules (200 mg total) by mouth 2 (two) times  daily as needed for mild constipation. 06/21/15  Yes Thurnell Lose, MD  FERROCITE 324 MG TABS TAKE 1 TABLET (106 MG OF IRON TOTAL) BY MOUTH DAILY. Patient taking differently: TAKE 1 TABLET (106 MG OF IRON TOTAL) BY MOUTH DAILY AFTER LUNCH 02/28/15  Yes Ladell Pier, MD  LANTUS SOLOSTAR 100 UNIT/ML Solostar Pen Inject 18 Units into the skin daily at 12 noon.  07/01/15  Yes Historical Provider, MD  levothyroxine (SYNTHROID, LEVOTHROID) 75 MCG tablet Take 75 mcg by mouth daily before breakfast.    Yes Historical Provider, MD  potassium chloride (KLOR-CON M10) 10 MEQ tablet TAKE 1 TABLET (10 MEQ TOTAL) BY MOUTH DAILY. Patient taking differently: Take 10 mEq by mouth daily after lunch.  09/07/14  Yes Darlin Coco, MD  spironolactone (ALDACTONE) 25 MG tablet 1/2 tablet by mouth daily Patient taking differently: Take 12.5 mg by mouth daily.  08/09/15  Yes Darlin Coco, MD  ezetimibe (ZETIA) 10 MG tablet Take 1 tablet (10 mg total) by mouth daily. Patient not taking: Reported on 08/08/2015 09/07/14   Darlin Coco, MD   Allergies  Allergen Reactions  . Amlodipine Swelling    Currently taking exforge, separate med-Amlodipiine caused lower leg edema  . Contrast Media [Iodinated Diagnostic Agents] Nausea Only and Other (See Comments)    Patient experienced chills, nausea, hypothermic symptoms, also weird feelings  . Hydralazine Other (See Comments)    Severe hypotension-suddenly and drastically dropped BP  . Metoprolol Other (See Comments)    Severe hypotension-suddenly and drastically dropped BP, same as hydralazine  . Codeine Other (See Comments)    Makes her feel "crazy."  . Demerol Other (See Comments)    Makes her feel "crazy."  . Librium Other (See Comments)    Makes her feel "crazy."  . Lipitor [Atorvastatin Calcium] Other (See Comments)    Makes her feel "crazy."    FAMILY HISTORY:  indicated that her mother is deceased. She indicated that her father is deceased. She  indicated that only one of her two sisters is alive. She indicated that her brother is deceased.  SOCIAL HISTORY:  reports that she has never smoked. She has never used smokeless tobacco. She reports that she does not drink alcohol or use illicit drugs.  REVIEW OF SYSTEMS:  Denies any nausea, vomiting, or diarrhea. Denies any dysuria or hematuria.A pertinent 14 point review of systems is negative except as per the history of presenting illness.  SUBJECTIVE:   VITAL SIGNS: Temp:  [98.1 F (36.7 C)] 98.1 F (36.7 C) (09/28 0320) Pulse Rate:  [42-63] 56 (09/28 0515) Resp:  [10-28] 15 (09/28 0515) BP: (89-242)/(40-98) 193/72 mmHg (09/28 0515) SpO2:  [98 %-100 %] 100 % (09/28 0515) Weight:  [186 lb 15.2 oz (84.8 kg)-189 lb 9.6 oz (86.002 kg)] 186 lb 15.2 oz (84.8 kg) (09/28 0320) HEMODYNAMICS:   VENTILATOR SETTINGS:   INTAKE / OUTPUT:  Intake/Output Summary (Last 24 hours) at 08/10/15 0626 Last data filed at 08/10/15 0345  Gross per 24 hour  Intake     60 ml  Output      0 ml  Net     60 ml    PHYSICAL EXAMINATION: General:  Awake. Alert. No acute distress.  Integument:  Warm & dry. No rash on exposed skin. Bruising left antecubital fossa. Lymphatics:  No appreciated cervical  or supraclavicular lymphadenoapthy. HEENT:  Moist mucus membranes. No oral ulcers. No scleral injection or icterus. PERRL. Cardiovascular:  Regular rate. No edema. No appreciable JVD.  Pulmonary:  Good aeration & clear to auscultation bilaterally. Symmetric chest wall rise. No accessory muscle use on room air. Abdomen: Soft. Normal bowel sounds. Nondistended. Grossly nontender. Musculoskeletal:  Normal bulk and tone. Hand grip strength, biceps, dorsiflexion, & plantarflexion 5/5 bilaterally. Right & left leg rise 4-/5. No joint deformity or effusion appreciated. Neurological:  Right-sided facial droop. Oriented to person, place, & time. Psychiatric:  Mood and affect congruent. Speech normal rhythm, rate &  tone.   LABS:  CBC  Recent Labs Lab 08/10/15 0022 08/10/15 0030  WBC  --  26.7*  HGB 15.0 13.5  HCT 44.0 41.4  PLT  --  278   Coag's  Recent Labs Lab 08/10/15 0030  APTT 31  INR 1.07   BMET  Recent Labs Lab 08/10/15 0022 08/10/15 0030  NA 135 134*  K 3.9 3.8  CL 99* 101  CO2  --  22  BUN 20 15  CREATININE 0.80 0.89  GLUCOSE 178* 183*   Electrolytes  Recent Labs Lab 08/10/15 0030  CALCIUM 9.7   Sepsis Markers No results for input(s): LATICACIDVEN, PROCALCITON, O2SATVEN in the last 168 hours. ABG No results for input(s): PHART, PCO2ART, PO2ART in the last 168 hours. Liver Enzymes  Recent Labs Lab 08/10/15 0030  AST 24  ALT 22  ALKPHOS 80  BILITOT 0.6  ALBUMIN 3.4*   Cardiac Enzymes No results for input(s): TROPONINI, PROBNP in the last 168 hours. Glucose  Recent Labs Lab 08/10/15 0356  GLUCAP 175*    Imaging Ct Head Wo Contrast  08/10/2015   CLINICAL DATA:  Code stroke. Right-sided weakness with right facial droop.  EXAM: CT HEAD WITHOUT CONTRAST  TECHNIQUE: Contiguous axial images were obtained from the base of the skull through the vertex without intravenous contrast.  COMPARISON:  Head CT 07/29/2015, brain MRI 07/30/2015  FINDINGS: No intracranial hemorrhage. Expected evolution with minimal developing hypodensity in the left basal ganglia at site of acute infarct on MRI, no associated edema. No evidence territorial infarct or progressive acute ischemia. Background atrophy and chronic small vessel ischemia is stable in the interim. No cerebral edema, extra-axial fluid collection, mass effect or midline shift. Atherosclerosis again seen of the skullbase vasculature. Tiny mucous retention cyst right maxillary sinus. Calvarium is intact.  IMPRESSION: 1. Expected evolution of the left basal ganglia infarct since prior exam. No interval hemorrhage, mass effect or midline shift. 2. Background chronic small vessel ischemia without CT findings of acute  territorial infarct.  These results were called by telephone at the time of interpretation on 08/10/2015 at 12:11 am to Dr. Janann Colonel , who verbally acknowledged these results.   Electronically Signed   By: Jeb Levering M.D.   On: 08/10/2015 00:12   Dg Chest Portable 1 View  08/10/2015   CLINICAL DATA:  Weakness.  EXAM: PORTABLE CHEST 1 VIEW  COMPARISON:  07/29/2015  FINDINGS: Lung volumes are low. The cardiomediastinal contours are unchanged. Pulmonary vasculature is normal. No consolidation, pleural effusion, or pneumothorax. No acute osseous abnormalities are seen. Remote left rib fracture is unchanged.  IMPRESSION: No acute pulmonary process.   Electronically Signed   By: Jeb Levering M.D.   On: 08/10/2015 02:27    ASSESSMENT / PLAN:  PULMONARY A: No acute issues.  P:   Monitor continuous pulse ox.   CARDIOVASCULAR A:  Shock -  resolved.  H/O HTN  P:  Monitor vitals.  Checking EKG stat Trending troponin I Checking transthoracic echocardiogram  RENAL A:   No acute issues.  P:   Monitor UOP Daily BUN/Creatinine Daily electrolyte panel  GASTROINTESTINAL A:   No acute issues.  P:   NPO except meds for now.  HEMATOLOGIC A:   Leukocytosis - Likely secondary to CLL H/O CLL  P:  Monitor leukocytosis on CBC daily SCDs   INFECTIOUS A:  No acute process.  P:   Procalcitonin algorithm  ENDOCRINE A:   H/O DM H/O Hypothryoidism    P:   Sliding scale insulin algorithm Accuchecks q4hr  NEUROLOGIC A:   Worsening dysarthria & weakness Recent Left Basal Ganglia CVA - s/p TPA on prior admission  P:   ASA Plavix MRA Further recs per neurology   FAMILY  - Updates: No family at bedside for update.  - Inter-disciplinary family meet or Palliative Care meeting due by:  10/4   TODAY'S SUMMARY: 81 year old female transferred to the intensive care unit overnight. Patient had transient hypotension that responded to a 500 mL bolus of normal saline. She  also had transiently increased right-sided weakness and dysarthria which seemed to have significantly improved with resolution of hypotension. Neuro plans on checking MRI of the neck. Unable to obtain CT angiogram due to allergy and IV access.  I have spent a total of 32 minutes of critical care time today caring for the patient & reviewing the patient's electronic medical record.    Sonia Baller Ashok Cordia, M.D. [facility] Pulmonary & Critical Care Pager:  [phone] After 3pm or if no response, call [phone]  08/10/2015, 6:26 AM

## 2015-08-10 NOTE — Progress Notes (Signed)
Notified by RN that patients weakness and dysarthria worsened, reports severe dysarthria with increased right side weakness. NIHSS increased from 4 to 8. This coincided with a drop in blood pressure from 163/53 to 89/44. Unable to get stat CT angio due to allergy and poor IV access. Gave NS bolus with improvement in blood pressure. Speech and weakness improved. Unclear etiology of drop in blood pressure. No signs of infectious process. Has baseline elevated WBC due to CLL. Patient transferred to neuro-ICU. CCM consulted. Will add MRA of neck to imaging studies. Will monitor closely.     Jim Like, DO [facility] [phone]  If 7pm- 7am, please page neurology on call as listed in [place].

## 2015-08-10 NOTE — Telephone Encounter (Signed)
Message from pt's daughter to inform Dr. Benay Spice she has been readmitted to the hospital for stroke. Canceled this week's appointment. Asks if Dr. Benay Spice will "run tests" while she's in the hospital. Dr. Benay Spice made aware.

## 2015-08-10 NOTE — Telephone Encounter (Signed)
New message   Daughter calling   Mom had cva - in ICU at  wanted [place] to know.

## 2015-08-10 NOTE — Progress Notes (Signed)
Called by Catalina Antigua, primary RN for worsening right sided weakness, aphasia and hypotension.  Dr Janann Colonel paged and to bedside.  Pt's speech more slurred, right sided extremities again with no effort again gravity, BP 109/40.  CTA ordered, 2nd PIV placed, NS bolus ordered and given, transfer to 61M arranged per Dr Janann Colonel.  Pt to be transported to CT with primary and Dr Janann Colonel, will monitor.

## 2015-08-10 NOTE — H&P (Signed)
PCP:   Antony Blackbird, MD   Chief Complaint:  Right side weak  HPI: 81 yo female with recent Acute infarction affecting the left basal ganglia and radiating white matter tracts on 9/16 s/p TPA dc home with complete resolution of her right sided hemiparesis at that time has been on aspirin and plavix comes in tonight with right sided weakness and right facial dropping.  She went to bed around 11pm.  Woke up a couple of hours later with above symptoms.  Her symptoms have waxed and waned in the ED, and pt was held in ED to decide if tpa was going to be given again.  At this time her right sided weakness has much improved.  She denies any pain at this time.  No headache.  Referred for admission for CVA.  Neurology consulted and has seen the patient and advised against tpa at this time.  Review of Systems:  Positive and negative as per HPI otherwise all other systems are negative  Past Medical History: Past Medical History  Diagnosis Date  . Hypertension   . MVP (mitral valve prolapse)   . Hypothyroidism   . Anemia   . Hypercholesterolemia   . Heart murmur   . Type II diabetes mellitus   . Arthritis     "back; knees, hands" (05/11/2015)  . Breast cancer, right breast     "cells went into the blood and caused her hip to break" (05/11/2015)   Past Surgical History  Procedure Laterality Date  . Hemiarthroplasty hip Right     "partial replacement"  . Pelvic and para-aortic lymph node dissection    . Tonsillectomy    . Appendectomy    . Cholecystectomy    . US echocardiography  03/30/2009    EF 55-60%  . Cardiovascular stress test  01/28/2008    EF 74%  . Esophagogastroduodenoscopy N/A 07/08/2014    Procedure: ESOPHAGOGASTRODUODENOSCOPY (EGD);  Surgeon: Winfield Cunas., MD;  Location: [facility];  Service: Endoscopy;  Laterality: N/A;  . Colonoscopy N/A 07/10/2014    Procedure: COLONOSCOPY;  Surgeon: Lear Ng, MD;  Location: [facility];  Service: Endoscopy;  Laterality:  N/A;  . Total abdominal hysterectomy      w/BSO  . Dilation and curettage of uterus    . Breast biopsy Right 2009  . Breast lumpectomy Right 2009  . Fracture surgery    . Cataract extraction w/ intraocular lens  implant, bilateral Bilateral     Medications: Prior to Admission medications   Medication Sig Start Date End Date Taking? Authorizing Provider  acetaminophen (TYLENOL) 500 MG tablet Take 500 mg by mouth every 6 (six) hours as needed (pain).    Historical Provider, MD  amLODipine-valsartan (EXFORGE) 10-320 MG per tablet Take 1 tablet by mouth daily. 03/22/15   Darlin Coco, MD  anastrozole (ARIMIDEX) 1 MG tablet TAKE 1 TABLET BY MOUTH EVERY DAY 02/28/15   Ladell Pier, MD  aspirin EC 81 MG tablet Take 81 mg by mouth daily.    Historical Provider, MD  atenolol (TENORMIN) 50 MG tablet Take 1 tablet (50 mg total) by mouth 3 (three) times daily. 07/06/15   Darlin Coco, MD  Calcium Carbonate-Vitamin D (CALCIUM + D PO) Take 1 tablet by mouth daily after lunch.     Historical Provider, MD  cephALEXin (KEFLEX) 500 MG capsule Take 1 capsule (500 mg total) by mouth every 12 (twelve) hours. 08/02/15   David L Rinehuls, PA-C  cholecalciferol (VITAMIN D) 1000 UNITS  tablet Take 1,000 Units by mouth daily after lunch.     Historical Provider, MD  cloNIDine (CATAPRES) 0.1 MG tablet Take 1 tablet (0.1 mg total) by mouth 2 (two) times daily. 08/02/15   David L Rinehuls, PA-C  clopidogrel (PLAVIX) 75 MG tablet Take 1 tablet (75 mg total) by mouth daily. 08/02/15   David L Rinehuls, PA-C  Cyanocobalamin (B-12) 500 MCG TABS Take 500 mcg by mouth daily after lunch.     Historical Provider, MD  docusate sodium (COLACE) 100 MG capsule Take 2 capsules (200 mg total) by mouth 2 (two) times daily as needed for mild constipation. 06/21/15   Thurnell Lose, MD  ezetimibe (ZETIA) 10 MG tablet Take 1 tablet (10 mg total) by mouth daily. Patient not taking: Reported on 08/08/2015 09/07/14   Darlin Coco,  MD  FERROCITE 324 MG TABS TAKE 1 TABLET (106 MG OF IRON TOTAL) BY MOUTH DAILY. Patient taking differently: TAKE 1 TABLET (106 MG OF IRON TOTAL) BY MOUTH DAILY AFTER LUNCH 02/28/15   Ladell Pier, MD  LANTUS SOLOSTAR 100 UNIT/ML Solostar Pen Inject 18 Units into the skin daily at 12 noon.  07/01/15   Historical Provider, MD  levothyroxine (SYNTHROID, LEVOTHROID) 75 MCG tablet Take 75 mcg by mouth daily before breakfast.     Historical Provider, MD  potassium chloride (KLOR-CON M10) 10 MEQ tablet TAKE 1 TABLET (10 MEQ TOTAL) BY MOUTH DAILY. Patient taking differently: Take 10 mEq by mouth daily after lunch.  09/07/14   Darlin Coco, MD  spironolactone (ALDACTONE) 25 MG tablet 1/2 tablet by mouth daily 08/09/15   Darlin Coco, MD    Allergies:   Allergies  Allergen Reactions  . Amlodipine Swelling    Currently taking exforge, separate med-Amlodipiine caused lower leg edema  . Contrast Media [Iodinated Diagnostic Agents] Nausea Only and Other (See Comments)    Patient experienced chills, nausea, hypothermic symptoms, also weird feelings  . Hydralazine Other (See Comments)    Severe hypotension-suddenly and drastically dropped BP  . Metoprolol Other (See Comments)    Severe hypotension-suddenly and drastically dropped BP, same as hydralazine  . Codeine Other (See Comments)    Makes her feel "crazy."  . Demerol Other (See Comments)    Makes her feel "crazy."  . Librium Other (See Comments)    Makes her feel "crazy."  . Lipitor [Atorvastatin Calcium] Other (See Comments)    Makes her feel "crazy."    Social History:  reports that she has never smoked. She has never used smokeless tobacco. She reports that she does not drink alcohol or use illicit drugs.  Family History: Family History  Problem Relation Age of Onset  . Hypertension Mother   . Heart attack Father   . Hypertension Father   . Diabetes Sister   . Hypertension Sister   . Liver cancer Sister   . Heart attack  Brother   . Hypertension Brother   . Diabetes Brother   . Diabetes Sister   . Hypertension Sister     Physical Exam: Filed Vitals:   08/10/15 0017 08/10/15 0021 08/10/15 0030 08/10/15 0100  BP: 205/74  205/66 242/90  Pulse: 57  57 63  Temp:  98.1 F (36.7 C)    TempSrc:      Resp: 19  19 24   Height:      Weight:      SpO2: 100%  100% 100%   General appearance: alert, cooperative and no distress Head: Normocephalic, without obvious abnormality,  atraumatic Eyes: negative Nose: Nares normal. Septum midline. Mucosa normal. No drainage or sinus tenderness. Neck: no JVD and supple, symmetrical, trachea midline Lungs: clear to auscultation bilaterally Heart: regular rate and rhythm, S1, S2 normal, no murmur, click, rub or gallop Abdomen: soft, non-tender; bowel sounds normal; no masses,  no organomegaly Extremities: extremities normal, atraumatic, no cyanosis or edema Pulses: 2+ and symmetric Skin: Skin color, texture, turgor normal. No rashes or lesions Neurologic: Mental status: Alert, oriented, thought content appropriate  Right facial droop, 4/5 strength rue, nml all others    Labs on Admission:   Recent Labs  08/10/15 0022 08/10/15 0030  NA 135 134*  K 3.9 3.8  CL 99* 101  CO2  --  22  GLUCOSE 178* 183*  BUN 20 15  CREATININE 0.80 0.89  CALCIUM  --  9.7    Recent Labs  08/10/15 0030  AST 24  ALT 22  ALKPHOS 80  BILITOT 0.6  PROT 6.3*  ALBUMIN 3.4*     Recent Labs  08/10/15 0022 08/10/15 0030  WBC  --  26.7*  NEUTROABS  --  9.3*  HGB 15.0 13.5  HCT 44.0 41.4  MCV  --  92.0  PLT  --  278     Radiological Exams on Admission: Ct Head Wo Contrast  08/10/2015   CLINICAL DATA:  Code stroke. Right-sided weakness with right facial droop.  EXAM: CT HEAD WITHOUT CONTRAST  TECHNIQUE: Contiguous axial images were obtained from the base of the skull through the vertex without intravenous contrast.  COMPARISON:  Head CT 07/29/2015, brain MRI 07/30/2015   FINDINGS: No intracranial hemorrhage. Expected evolution with minimal developing hypodensity in the left basal ganglia at site of acute infarct on MRI, no associated edema. No evidence territorial infarct or progressive acute ischemia. Background atrophy and chronic small vessel ischemia is stable in the interim. No cerebral edema, extra-axial fluid collection, mass effect or midline shift. Atherosclerosis again seen of the skullbase vasculature. Tiny mucous retention cyst right maxillary sinus. Calvarium is intact.  IMPRESSION: 1. Expected evolution of the left basal ganglia infarct since prior exam. No interval hemorrhage, mass effect or midline shift. 2. Background chronic small vessel ischemia without CT findings of acute territorial infarct.  These results were called by telephone at the time of interpretation on 08/10/2015 at 12:11 am to Dr. Janann Colonel , who verbally acknowledged these results.   Electronically Signed   By: Jeb Levering M.D.   On: 08/10/2015 00:12   Ct Head Wo Contrast  07/29/2015   CLINICAL DATA:  Acute onset right-sided weakness  EXAM: CT HEAD WITHOUT CONTRAST  TECHNIQUE: Contiguous axial images were obtained from the base of the skull through the vertex without intravenous contrast.  COMPARISON:  May 11, 2015  FINDINGS: Mild diffuse atrophy is stable. There is no intracranial mass, hemorrhage, extra-axial fluid collection, or midline shift. There is small vessel disease throughout the centra semiovale bilaterally. There is small vessel disease in both external capsules, stable. Small vessel disease is also noted in the superior left thalamus. No acute infarct evident. The middle cerebral arteries do not show increased attenuation. There is atherosclerotic calcification in the proximal left middle cerebral artery, a stable finding. Bony calvarium appears intact. The mastoid air cells are clear. There are small retention cysts in the right maxillary antrum.  IMPRESSION: Atrophy with  extensive supratentorial small vessel disease, stable. No acute infarct evident. No hemorrhage or mass effect. Calcification proximal left middle cerebral artery is a stable  finding. There are retention cysts in the right maxillary antrum.  Critical Value/emergent results were called by telephone at the time of interpretation on 07/29/2015 at 11:18 am to Dr. Nicole Kindred, neurology  , who verbally acknowledged these results.   Electronically Signed   By: Lowella Grip III M.D.   On: 07/29/2015 11:19   Mr Angiogram Neck W Wo Contrast  07/30/2015   CLINICAL DATA:  Awoke from a nap with right-sided weakness. Right facial droop. Treated with tPA. Symptoms began 07/29/2015  EXAM: MRI HEAD WITHOUT AND WITH CONTRAST  MRA HEAD WITHOUT CONTRAST  MRA NECK WITHOUT AND WITH CONTRAST  TECHNIQUE: Multiplanar, multiecho pulse sequences of the brain and surrounding structures were obtained without and with intravenous contrast. Angiographic images of the Circle of Willis were obtained using MRA technique without intravenous contrast. Angiographic images of the neck were obtained using MRA technique without and with intravenous contrast. Carotid stenosis measurements (when applicable) are obtained utilizing NASCET criteria, using the distal internal carotid diameter as the denominator.  CONTRAST:  86mL MULTIHANCE GADOBENATE DIMEGLUMINE 529 MG/ML IV SOLN  COMPARISON:  Head CT 07/29/2015.  MRI 10/19/2003.  FINDINGS: MRI HEAD FINDINGS  Diffusion imaging shows acute infarction within the left basal ganglia and radiating white matter tracts. No large vessel or cortical infarction. No evidence of swelling or hemorrhage.  There chronic small-vessel ischemic changes affecting the pons and cerebellum. The cerebral hemispheres elsewhere show old small vessel infarctions within the thalami, basal ganglia and throughout the cerebral hemispheric white matter. No cortical or large vessel territory infarction. No mass lesion, hemorrhage,  hydrocephalus or extra-axial collection. No pituitary mass. No inflammatory sinus disease. No skull or skullbase lesion. After contrast administration, no abnormal enhancement occurs.  MRA HEAD FINDINGS  Both internal carotid arteries are patent through the skullbase. The siphon regions are patent, but there is pronounced atherosclerotic irregularity bilaterally. Maximal stenosis in the siphon regions measures approximately 30%. There is severe stenosis in the supraclinoid internal carotid arteries bilaterally, worse on the left than the right. Stenosis on the left is 80% or greater. Stenosis on the right is 60-70% beyond that, the anterior and middle cerebral vessels are patent without dominant stenosis. The more distal vessels show narrowing and irregularity consistent with diffuse intracranial atherosclerotic disease.  Both vertebral arteries are patent but show narrowing and irregularity throughout their course consistent with diffuse atherosclerosis. There is no stenosis greater than 50% affecting either distal vertebral artery. The basilar artery shows narrowing and irregularity with a proximal stenosis measured at 50%. Superior cerebellar and posterior cerebral artery vessels are patent, but show diffuse atherosclerotic irregularity and narrowing.  MRA NECK FINDINGS  Branching pattern of the brachiocephalic vessels from the arch is normal with a common origin of the innominate artery and left common carotid artery. Origin stenosis. Both common carotid arteries are widely patent to the bifurcations. There is mild atherosclerotic disease at both carotid bifurcations but no stenosis. Both cervical internal carotid arteries are widely patent.  Both vertebral arteries are patent with the right being larger than the left. There is 30-50% stenosis at the right vertebral artery origin and at the left vertebral artery origin. Beyond that, the vessels appear widely patent through the cervical region.  IMPRESSION: Acute  infarction affecting the left basal ganglia and radiating white matter tracts. No swelling or hemorrhage.  Extensive chronic small vessel ischemic changes elsewhere throughout the brain.  No carotid bifurcation disease. 30-50% stenoses at both vertebral artery origins.  Severe atherosclerotic disease intracranially. Advanced  disease in the siphon regions and supraclinoid internal carotid arteries with stenosis of the left supraclinoid internal carotid artery of 80% or greater. Stenosis on the right is 60-70%. Approximately 50% stenoses in the distal vertebral arteries and in the proximal basilar artery.  Diffuse atherosclerotic narrowing and irregularity in the smaller intracranial branches.   Electronically Signed   By: Nelson Chimes M.D.   On: 07/30/2015 13:51   Mr Jeri Cos YQ Contrast  07/30/2015   CLINICAL DATA:  Awoke from a nap with right-sided weakness. Right facial droop. Treated with tPA. Symptoms began 07/29/2015  EXAM: MRI HEAD WITHOUT AND WITH CONTRAST  MRA HEAD WITHOUT CONTRAST  MRA NECK WITHOUT AND WITH CONTRAST  TECHNIQUE: Multiplanar, multiecho pulse sequences of the brain and surrounding structures were obtained without and with intravenous contrast. Angiographic images of the Circle of Willis were obtained using MRA technique without intravenous contrast. Angiographic images of the neck were obtained using MRA technique without and with intravenous contrast. Carotid stenosis measurements (when applicable) are obtained utilizing NASCET criteria, using the distal internal carotid diameter as the denominator.  CONTRAST:  47mL MULTIHANCE GADOBENATE DIMEGLUMINE 529 MG/ML IV SOLN  COMPARISON:  Head CT 07/29/2015.  MRI 10/19/2003.  FINDINGS: MRI HEAD FINDINGS  Diffusion imaging shows acute infarction within the left basal ganglia and radiating white matter tracts. No large vessel or cortical infarction. No evidence of swelling or hemorrhage.  There chronic small-vessel ischemic changes affecting the pons  and cerebellum. The cerebral hemispheres elsewhere show old small vessel infarctions within the thalami, basal ganglia and throughout the cerebral hemispheric white matter. No cortical or large vessel territory infarction. No mass lesion, hemorrhage, hydrocephalus or extra-axial collection. No pituitary mass. No inflammatory sinus disease. No skull or skullbase lesion. After contrast administration, no abnormal enhancement occurs.  MRA HEAD FINDINGS  Both internal carotid arteries are patent through the skullbase. The siphon regions are patent, but there is pronounced atherosclerotic irregularity bilaterally. Maximal stenosis in the siphon regions measures approximately 30%. There is severe stenosis in the supraclinoid internal carotid arteries bilaterally, worse on the left than the right. Stenosis on the left is 80% or greater. Stenosis on the right is 60-70% beyond that, the anterior and middle cerebral vessels are patent without dominant stenosis. The more distal vessels show narrowing and irregularity consistent with diffuse intracranial atherosclerotic disease.  Both vertebral arteries are patent but show narrowing and irregularity throughout their course consistent with diffuse atherosclerosis. There is no stenosis greater than 50% affecting either distal vertebral artery. The basilar artery shows narrowing and irregularity with a proximal stenosis measured at 50%. Superior cerebellar and posterior cerebral artery vessels are patent, but show diffuse atherosclerotic irregularity and narrowing.  MRA NECK FINDINGS  Branching pattern of the brachiocephalic vessels from the arch is normal with a common origin of the innominate artery and left common carotid artery. Origin stenosis. Both common carotid arteries are widely patent to the bifurcations. There is mild atherosclerotic disease at both carotid bifurcations but no stenosis. Both cervical internal carotid arteries are widely patent.  Both vertebral arteries  are patent with the right being larger than the left. There is 30-50% stenosis at the right vertebral artery origin and at the left vertebral artery origin. Beyond that, the vessels appear widely patent through the cervical region.  IMPRESSION: Acute infarction affecting the left basal ganglia and radiating white matter tracts. No swelling or hemorrhage.  Extensive chronic small vessel ischemic changes elsewhere throughout the brain.  No carotid bifurcation disease.  30-50% stenoses at both vertebral artery origins.  Severe atherosclerotic disease intracranially. Advanced disease in the siphon regions and supraclinoid internal carotid arteries with stenosis of the left supraclinoid internal carotid artery of 80% or greater. Stenosis on the right is 60-70%. Approximately 50% stenoses in the distal vertebral arteries and in the proximal basilar artery.  Diffuse atherosclerotic narrowing and irregularity in the smaller intracranial branches.   Electronically Signed   By: Nelson Chimes M.D.   On: 07/30/2015 13:51   Dg Chest Port 1 View  07/29/2015   CLINICAL DATA:  Code stroke, elevated white blood count  EXAM: PORTABLE CHEST - 1 VIEW  COMPARISON:  07/22/2015  FINDINGS: Normal heart size and vascular pattern. Stable aortic calcification. Nonacute appearing left sixth rib fracture. Minimal bibasilar atelectasis.  IMPRESSION: No active disease.   Electronically Signed   By: Skipper Cliche M.D.   On: 07/29/2015 12:35   Dg Abd Acute W/chest  07/22/2015   CLINICAL DATA:  Nausea and vomiting for 2 days, recurrent urinary tract infection  EXAM: DG ABDOMEN ACUTE W/ 1V CHEST  COMPARISON:  None.  FINDINGS: Mild cardiac enlargement. Calcification of the aorta. Lungs clear except for mild left base atelectasis. No free air. No abnormally dilated loops of bowel. Gas and stool into the rectum. No abnormal opacities.  IMPRESSION: No acute abnormalities.   Electronically Signed   By: Skipper Cliche M.D.   On: 07/22/2015 16:22    Mr Jodene Nam Head/brain Wo Cm  07/30/2015   CLINICAL DATA:  Awoke from a nap with right-sided weakness. Right facial droop. Treated with tPA. Symptoms began 07/29/2015  EXAM: MRI HEAD WITHOUT AND WITH CONTRAST  MRA HEAD WITHOUT CONTRAST  MRA NECK WITHOUT AND WITH CONTRAST  TECHNIQUE: Multiplanar, multiecho pulse sequences of the brain and surrounding structures were obtained without and with intravenous contrast. Angiographic images of the Circle of Willis were obtained using MRA technique without intravenous contrast. Angiographic images of the neck were obtained using MRA technique without and with intravenous contrast. Carotid stenosis measurements (when applicable) are obtained utilizing NASCET criteria, using the distal internal carotid diameter as the denominator.  CONTRAST:  49mL MULTIHANCE GADOBENATE DIMEGLUMINE 529 MG/ML IV SOLN  COMPARISON:  Head CT 07/29/2015.  MRI 10/19/2003.  FINDINGS: MRI HEAD FINDINGS  Diffusion imaging shows acute infarction within the left basal ganglia and radiating white matter tracts. No large vessel or cortical infarction. No evidence of swelling or hemorrhage.  There chronic small-vessel ischemic changes affecting the pons and cerebellum. The cerebral hemispheres elsewhere show old small vessel infarctions within the thalami, basal ganglia and throughout the cerebral hemispheric white matter. No cortical or large vessel territory infarction. No mass lesion, hemorrhage, hydrocephalus or extra-axial collection. No pituitary mass. No inflammatory sinus disease. No skull or skullbase lesion. After contrast administration, no abnormal enhancement occurs.  MRA HEAD FINDINGS  Both internal carotid arteries are patent through the skullbase. The siphon regions are patent, but there is pronounced atherosclerotic irregularity bilaterally. Maximal stenosis in the siphon regions measures approximately 30%. There is severe stenosis in the supraclinoid internal carotid arteries bilaterally,  worse on the left than the right. Stenosis on the left is 80% or greater. Stenosis on the right is 60-70% beyond that, the anterior and middle cerebral vessels are patent without dominant stenosis. The more distal vessels show narrowing and irregularity consistent with diffuse intracranial atherosclerotic disease.  Both vertebral arteries are patent but show narrowing and irregularity throughout their course consistent with diffuse atherosclerosis. There is no  stenosis greater than 50% affecting either distal vertebral artery. The basilar artery shows narrowing and irregularity with a proximal stenosis measured at 50%. Superior cerebellar and posterior cerebral artery vessels are patent, but show diffuse atherosclerotic irregularity and narrowing.  MRA NECK FINDINGS  Branching pattern of the brachiocephalic vessels from the arch is normal with a common origin of the innominate artery and left common carotid artery. Origin stenosis. Both common carotid arteries are widely patent to the bifurcations. There is mild atherosclerotic disease at both carotid bifurcations but no stenosis. Both cervical internal carotid arteries are widely patent.  Both vertebral arteries are patent with the right being larger than the left. There is 30-50% stenosis at the right vertebral artery origin and at the left vertebral artery origin. Beyond that, the vessels appear widely patent through the cervical region.  IMPRESSION: Acute infarction affecting the left basal ganglia and radiating white matter tracts. No swelling or hemorrhage.  Extensive chronic small vessel ischemic changes elsewhere throughout the brain.  No carotid bifurcation disease. 30-50% stenoses at both vertebral artery origins.  Severe atherosclerotic disease intracranially. Advanced disease in the siphon regions and supraclinoid internal carotid arteries with stenosis of the left supraclinoid internal carotid artery of 80% or greater. Stenosis on the right is 60-70%.  Approximately 50% stenoses in the distal vertebral arteries and in the proximal basilar artery.  Diffuse atherosclerotic narrowing and irregularity in the smaller intracranial branches.   Electronically Signed   By: Nelson Chimes M.D.   On: 07/30/2015 13:51   Old chart reviewed Case discussed with both dr Pam Drown in ED and dr Janann Colonel with  Neurology ekg reviewed no acute issues  Assessment/Plan  81 yo female with recurrent CVA s/p tpa/cva 9/16  Principal Problem:   CVA (cerebral vascular accident)-  Neurology following.  Cont aspirin/plavix.  Repeat mri in am.  Further work up per neurology team. Symptoms improving since arrival,   Stroke pathway  Active Problems:  All meds on hold due to swallow evaluation pending   Benign hypertensive heart disease without heart failure-  bp high at this time.  Allow permissive htn, recommendations per neurology team.   Pure hypercholesterolemia-  Will not check lipids as checked 11 days ago   RBBB   Type 2 diabetes mellitus with peripheral neuropathy-  Will not repeat hga1c as checked 11 days ago.  Place on ssi   CLL (chronic lymphocytic leukemia)  Admit to stepdown.  Further recommendations pending on future neurological course as she may need short term rehab.  DAVID,RACHAL A 08/10/2015, 1:50 AM

## 2015-08-10 NOTE — ED Notes (Signed)
At 0220, pt's symptoms remained unchanged with mild drift in both right extremities.  Pt is now out of TPA treatment window and will be admitted to step down for further stroke workup.  Pt has been closely monitored by ED and RR RN with daughter at bedside.  Daughter updated on admission plan and taken to her car via wheelchair.  Pt updated and comfortable.  Bedside handoff with Junie Panning, ED RN.

## 2015-08-10 NOTE — Progress Notes (Signed)
@  0420 [facility] changed from 4 @320  to 8: see EMR for details @0422  Tylene Fantasia of [facility] paged in regard to above change. @0428  Page returned-told to page Neurology for recommendations. @0432  Neurology Paged @0433  Page returned: no new orders:wait for MRI results @0435  Pt activated call bell but unable to verbalize requests-this RN entered room to find pt expressively aphasic, essentially flaccid on R side (flicker of movement to pain), hypotensive (89/44) and bradycardic (SB in 74s). Neurology repaged as well as RR RN-->both came to bedside-->orders received for CTA with premedication and transfer to 52M-->CTA aborted secondary to resolving symptoms, allergy, and poor vascular access-->pt transferred without complication to 6Q22 and bedside report given to receiving RN.

## 2015-08-10 NOTE — Progress Notes (Signed)
[facility]  Patient Status: Active (receiving services up to time of hospitalization)  [facility] is providing the following services: RN, PT and OT  If patient discharges after hours, please call [phone].   Molly [name] 08/10/2015, 9:39 AM

## 2015-08-10 NOTE — Evaluation (Signed)
Clinical/Bedside Swallow Evaluation Patient Details  Name: Molly [name] MRN: [id number] Date of Birth: 1923/06/15  Today's Date: 08/10/2015 Time: SLP Start Time (ACUTE ONLY): 2683 SLP Stop Time (ACUTE ONLY): 1107 SLP Time Calculation (min) (ACUTE ONLY): 18 min  Past Medical History:  Past Medical History  Diagnosis Date  . Hypertension   . MVP (mitral valve prolapse)   . Hypothyroidism   . Anemia   . Hypercholesterolemia   . Heart murmur   . Type II diabetes mellitus   . Arthritis     "back; knees, hands" (05/11/2015)  . Breast cancer, right breast     "cells went into the blood and caused her hip to break" (05/11/2015)   Past Surgical History:  Past Surgical History  Procedure Laterality Date  . Hemiarthroplasty hip Right     "partial replacement"  . Pelvic and para-aortic lymph node dissection    . Tonsillectomy    . Appendectomy    . Cholecystectomy    . US echocardiography  03/30/2009    EF 55-60%  . Cardiovascular stress test  01/28/2008    EF 74%  . Esophagogastroduodenoscopy N/A 07/08/2014    Procedure: ESOPHAGOGASTRODUODENOSCOPY (EGD);  Surgeon: Winfield Cunas., MD;  Location: [facility];  Service: Endoscopy;  Laterality: N/A;  . Colonoscopy N/A 07/10/2014    Procedure: COLONOSCOPY;  Surgeon: Lear Ng, MD;  Location: [facility];  Service: Endoscopy;  Laterality: N/A;  . Total abdominal hysterectomy      w/BSO  . Dilation and curettage of uterus    . Breast biopsy Right 2009  . Breast lumpectomy Right 2009  . Fracture surgery    . Cataract extraction w/ intraocular lens  implant, bilateral Bilateral    HPI:  81 year old female with recent admission and discharge for acute infarction of left basal ganglia on 9/16 status post TPA with complete resolution of right-sided hemiparesis. Re-admitted 9/28 wiht right sided weakness and facial droop. CT head: Expected evolution of the left basal ganglia infarct since prior   Assessment / Plan /  Recommendation Clinical Impression  Patient presents with a mild oral phase dysphagia impacted by right sided CN VII and XII damage impacting facial and lingual strength. Occassional, mild anterior labial spillage of bolus noted and mild right sided buccal residue post swallow which cleared with independent use of lingual sweep. Recommend initiation of regular diet, thin liquid with brief SLP f/u for diet tolerance and reinforcement for use of strategies given mild impairements.     Aspiration Risk  Mild    Diet Recommendation Age appropriate regular solids;Thin   Medication Administration: Whole meds with liquid Compensations: Small sips/bites;Slow rate;Check for pocketing;Check for anterior loss    Other  Recommendations Oral Care Recommendations: Oral care BID   Follow Up Recommendations       Frequency and Duration min 1 x/week  1 week   Pertinent Vitals/Pain n/a        Swallow Study    General Other Pertinent Information: 80 year old female with recent admission and discharge for acute infarction of left basal ganglia on 9/16 status post TPA with complete resolution of right-sided hemiparesis. Re-admitted 9/28 wiht right sided weakness and facial droop. CT head: Expected evolution of the left basal ganglia infarct since prior Type of Study: Bedside swallow evaluation Previous Swallow Assessment: note patient passed RN stroke swallow screen on previous admission. Diet Prior to this Study: NPO Temperature Spikes Noted: No Respiratory Status: Room air History of Recent Intubation: No Behavior/Cognition: Alert;Cooperative;Pleasant mood  Oral Cavity - Dentition: Adequate natural dentition/normal for age Self-Feeding Abilities: Able to feed self Patient Positioning: Upright in bed Baseline Vocal Quality: Normal Volitional Cough: Strong Volitional Swallow: Able to elicit    Oral/Motor/Sensory Function Overall Oral Motor/Sensory Function: Impaired (CN VII (facial) and XII  (hypoglossal) dysfunction on right)   Ice Chips Ice chips: Not tested   Thin Liquid Thin Liquid: Impaired Presentation: Cup;Self Fed;Straw Oral Phase Impairments: Reduced labial seal Oral Phase Functional Implications: Right anterior spillage (slight)    Nectar Thick Nectar Thick Liquid: Not tested   Honey Thick Honey Thick Liquid: Not tested   Puree Puree: Within functional limits Presentation: Self Fed;Spoon   Solid   GO   Molly McCoy MA, CCC-SLP [phone]  Solid: Within functional limits (independent use of lingual sweep to clear right buccal resid) Presentation: Self Fed       [name] Molly [name] 08/10/2015,11:10 AM

## 2015-08-10 NOTE — Care Management Note (Signed)
Case Management Note  Patient Details  Name: DAIONNA CROSSLAND MRN: [id number] Date of Birth: 06/21/1923  Subjective/Objective:    Pt admitted on 08/09/15 s/p CVA with Rt weakness.  PTA, pt resided at home with daughter.  She is active with [facility] for PT/OT and RN.                  Action/Plan: Will follow for discharge planning as pt progresses.    Will need PT/OT consults when able to tolerate therapy.    Expected Discharge Date:                  Expected Discharge Plan:  [place]  In-House Referral:     Discharge planning Services  CM Consult  Post Acute Care Choice:    Choice offered to:     DME Arranged:    DME Agency:     HH Arranged:    [place] Agency:     Status of Service:  In process, will continue to follow  Medicare Important Message Given:    Date Medicare IM Given:    Medicare IM give by:    Date Additional Medicare IM Given:    Additional Medicare Important Message give by:     If discussed at [place] of Stay Meetings, dates discussed:    Additional Comments:  Reinaldo Raddle, RN, BSN  Trauma/Neuro ICU Case Manager [phone]

## 2015-08-10 NOTE — Telephone Encounter (Signed)
Called Daughter back to let her know that Rip Harbour is out of the office today, but will be here tomorrow. Patient's daughter just wanted her to know, because they were going to come in next week for blood work. Will cancel lab appointment and forward to [facility].

## 2015-08-10 NOTE — ED Notes (Signed)
Pt. Was here 10 days ago for a stroke and patient went to bed. Pt. Slept for maybe an hour before calling her daughter telling her she think she had another stroke. Daughter called EMS. Per EMS pt has a right sided facial droop, right sided weakness and slurred speech. CBG 188. Pt is on aspirin and plavix

## 2015-08-10 NOTE — Consult Note (Signed)
Stroke Consult    Chief Complaint: right sided weakness HPI: Molly [name] is an 81 y.o. female hx of HTN, DM, recent CVA admitted with acute onset of right sided weakness and slurred speech. LSW at 2250 when she went to bed, shortly later she called out to her daughter that she was having a stroke. Daughter noted right sided weakness and slurred speech. EMS called. CT head imaging reviewed and no signs of acute stroke.   Patient was admitted on 9/16 with an acute stroke. At that time had right sided weakness though noted to be worse than current presentation. She received tPA at that time and per daughter was asymptomatic upon discharge.   Date last known well: 9/27 Time last known well: 2250 tPA Given: no, family and patient wished to hold off due to increased bleed risk Modified Rankin: Rankin Score=0  Had extensive discussion with patient and her daughter Molly [name] in regards to tPA in this case. Counseled them that tPA is a relative contraindication due to recent stroke on 9/16. Upon initial presentation her symptoms were mild (NIHSS of 4) with mild right sided weakness. At that time they stated they wished to hold off on tPA. Shortly later her right sided weakness worsened to a flaccid weakness. Again discussed tPA risks/benefits and they wished to go ahead with tPA at that time. Prior to initiation of tPA her strength improved and they again wished to hold on tPA. Counseled them on concern that she may worsen again vs risk of ICH. They express understanding and wish to hold on tPA at this time.    Past Medical History  Diagnosis Date  . Hypertension   . MVP (mitral valve prolapse)   . Hypothyroidism   . Anemia   . Hypercholesterolemia   . Heart murmur   . Type II diabetes mellitus   . Arthritis     "back; knees, hands" (05/11/2015)  . Breast cancer, right breast     "cells went into the blood and caused her hip to break" (05/11/2015)    Past Surgical History  Procedure Laterality  Date  . Hemiarthroplasty hip Right     "partial replacement"  . Pelvic and para-aortic lymph node dissection    . Tonsillectomy    . Appendectomy    . Cholecystectomy    . US echocardiography  03/30/2009    EF 55-60%  . Cardiovascular stress test  01/28/2008    EF 74%  . Esophagogastroduodenoscopy N/A 07/08/2014    Procedure: ESOPHAGOGASTRODUODENOSCOPY (EGD);  Surgeon: Winfield Cunas., MD;  Location: [facility];  Service: Endoscopy;  Laterality: N/A;  . Colonoscopy N/A 07/10/2014    Procedure: COLONOSCOPY;  Surgeon: Lear Ng, MD;  Location: [facility];  Service: Endoscopy;  Laterality: N/A;  . Total abdominal hysterectomy      w/BSO  . Dilation and curettage of uterus    . Breast biopsy Right 2009  . Breast lumpectomy Right 2009  . Fracture surgery    . Cataract extraction w/ intraocular lens  implant, bilateral Bilateral     Family History  Problem Relation Age of Onset  . Hypertension Mother   . Heart attack Father   . Hypertension Father   . Diabetes Sister   . Hypertension Sister   . Liver cancer Sister   . Heart attack Brother   . Hypertension Brother   . Diabetes Brother   . Diabetes Sister   . Hypertension Sister    Social History:  reports that  she has never smoked. She has never used smokeless tobacco. She reports that she does not drink alcohol or use illicit drugs.  Allergies:  Allergies  Allergen Reactions  . Amlodipine Swelling    Currently taking exforge, separate med-Amlodipiine caused lower leg edema  . Contrast Media [Iodinated Diagnostic Agents] Nausea Only and Other (See Comments)    Patient experienced chills, nausea, hypothermic symptoms, also weird feelings  . Hydralazine Other (See Comments)    Severe hypotension-suddenly and drastically dropped BP  . Metoprolol Other (See Comments)    Severe hypotension-suddenly and drastically dropped BP, same as hydralazine  . Codeine Other (See Comments)    Makes her feel "crazy."  .  Demerol Other (See Comments)    Makes her feel "crazy."  . Librium Other (See Comments)    Makes her feel "crazy."  . Lipitor [Atorvastatin Calcium] Other (See Comments)    Makes her feel "crazy."     (Not in a hospital admission)  ROS: Out of a complete 14 system review, the patient complains of only the following symptoms, and all other reviewed systems are negative. +weakness    Physical Examination: Filed Vitals:   08/10/15 0021  BP:   Pulse:   Temp: 98.1 F (36.7 C)  Resp:    Physical Exam  Constitutional: He appears well-developed and well-nourished.  Psych: Affect appropriate to situation Eyes: No scleral injection HENT: No OP obstrucion Head: Normocephalic.  Cardiovascular: Normal rate and regular rhythm.  Respiratory: Effort normal and breath sounds normal.  GI: Soft. Bowel sounds are normal. No distension. There is no tenderness.  Skin: WDI  Neurologic Examination: Mental Status: Alert, oriented, thought content appropriate.  Speech fluent without evidence of aphasia. Moderate dysarthria. Able to follow 3 step commands without difficulty. Cranial Nerves: II: funduscopic exam wnl bilaterally, visual fields grossly normal, pupils equal, round, reactive to light and accommodation III,IV, VI: ptosis not present, extra-ocular motions intact bilaterally V,VII:right sided facial droop, facial light touch sensation normal bilaterally VIII: hearing normal bilaterally IX,X: gag reflex present XI: trapezius strength/neck flexion strength normal bilaterally XII: tongue strength normal  Motor: Right : Upper extremity    Left:     Upper extremity 4+/5 deltoid       5/5 deltoid 5-/5 biceps      5/5 biceps  5-/5 triceps      5/5 triceps 5-/5 hand grip      5/5 hand grip  Lower extremity     Lower extremity 5-/5 hip flexor      5/5 hip flexor 5-/5 quadricep      5/5 quadriceps  5-/5 hamstrings     5/5 hamstrings 5-/5 plantar flexion       5/5 plantar  flexion 5-/5 plantar extension     5/5 plantar extension Tone and bulk:normal tone throughout; no atrophy noted Sensory: Pinprick and light touch intact throughout, bilaterally Deep Tendon Reflexes: 2+ and symmetric throughout Plantars: Right: downgoing   Left: downgoing Cerebellar: normal finger-to-nose, and normal heel-to-shin test Gait: deferred  Laboratory Studies:   Basic Metabolic Panel:  Recent Labs Lab 08/10/15 0022  NA 135  K 3.9  CL 99*  GLUCOSE 178*  BUN 20  CREATININE 0.80    Liver Function Tests: No results for input(s): AST, ALT, ALKPHOS, BILITOT, PROT, ALBUMIN in the last 168 hours. No results for input(s): LIPASE, AMYLASE in the last 168 hours. No results for input(s): AMMONIA in the last 168 hours.  CBC:  Recent Labs Lab 08/10/15 0022  HGB  15.0  HCT 44.0    Cardiac Enzymes: No results for input(s): CKTOTAL, CKMB, CKMBINDEX, TROPONINI in the last 168 hours.  BNP: Invalid input(s): POCBNP  CBG: No results for input(s): GLUCAP in the last 168 hours.  Microbiology: Results for orders placed or performed during the hospital encounter of 07/29/15  MRSA PCR Screening     Status: None   Collection Time: 07/29/15 12:45 PM  Result Value Ref Range Status   MRSA by PCR NEGATIVE NEGATIVE Final    Comment:        The GeneXpert MRSA Assay (FDA approved for NASAL specimens only), is one component of a comprehensive MRSA colonization surveillance program. It is not intended to diagnose MRSA infection nor to guide or monitor treatment for MRSA infections.   Culture, blood (routine x 2)     Status: None   Collection Time: 07/29/15  1:35 PM  Result Value Ref Range Status   Specimen Description BLOOD LEFT HAND  Final   Special Requests BOTTLES DRAWN AEROBIC ONLY 5CC  Final   Culture NO GROWTH 5 DAYS  Final   Report Status 08/03/2015 FINAL  Final  Culture, blood (routine x 2)     Status: None   Collection Time: 07/29/15  1:45 PM  Result Value Ref  Range Status   Specimen Description BLOOD RIGHT HAND  Final   Special Requests BOTTLES DRAWN AEROBIC AND ANAEROBIC 5CC  Final   Culture NO GROWTH 5 DAYS  Final   Report Status 08/03/2015 FINAL  Final  Urine culture     Status: None   Collection Time: 07/29/15  9:18 PM  Result Value Ref Range Status   Specimen Description URINE, CLEAN CATCH  Final   Special Requests NONE  Final   Culture >=100,000 COLONIES/mL ESCHERICHIA COLI  Final   Report Status 08/01/2015 FINAL  Final   Organism ID, Bacteria ESCHERICHIA COLI  Final      Susceptibility   Escherichia coli - MIC*    AMPICILLIN >=32 RESISTANT Resistant     CEFAZOLIN 16 SENSITIVE Sensitive     CEFTRIAXONE <=1 SENSITIVE Sensitive     CIPROFLOXACIN >=4 RESISTANT Resistant     GENTAMICIN <=1 SENSITIVE Sensitive     IMIPENEM <=0.25 SENSITIVE Sensitive     NITROFURANTOIN <=16 SENSITIVE Sensitive     TRIMETH/SULFA <=20 SENSITIVE Sensitive     AMPICILLIN/SULBACTAM >=32 RESISTANT Resistant     PIP/TAZO <=4 SENSITIVE Sensitive     * >=100,000 COLONIES/mL ESCHERICHIA COLI    Coagulation Studies: No results for input(s): LABPROT, INR in the last 72 hours.  Urinalysis: No results for input(s): COLORURINE, LABSPEC, PHURINE, GLUCOSEU, HGBUR, BILIRUBINUR, KETONESUR, PROTEINUR, UROBILINOGEN, NITRITE, LEUKOCYTESUR in the last 168 hours.  Invalid input(s): APPERANCEUR  Lipid Panel:     Component Value Date/Time   CHOL 186 07/30/2015 0239   TRIG 182* 07/30/2015 0239   HDL 36* 07/30/2015 0239   CHOLHDL 5.2 07/30/2015 0239   VLDL 36 07/30/2015 0239   LDLCALC 114* 07/30/2015 0239    HgbA1C:  Lab Results  Component Value Date   HGBA1C 7.8* 07/30/2015    Urine Drug Screen:     Component Value Date/Time   LABOPIA NONE DETECTED 05/11/2015 1312   COCAINSCRNUR NONE DETECTED 05/11/2015 1312   LABBENZ NONE DETECTED 05/11/2015 1312   AMPHETMU NONE DETECTED 05/11/2015 1312   THCU NONE DETECTED 05/11/2015 1312   LABBARB NONE DETECTED  05/11/2015 1312    Alcohol Level: No results for input(s): ETH in the last 168  hours.  Other results:  Imaging: Ct Head Wo Contrast  08/10/2015   CLINICAL DATA:  Code stroke. Right-sided weakness with right facial droop.  EXAM: CT HEAD WITHOUT CONTRAST  TECHNIQUE: Contiguous axial images were obtained from the base of the skull through the vertex without intravenous contrast.  COMPARISON:  Head CT 07/29/2015, brain MRI 07/30/2015  FINDINGS: No intracranial hemorrhage. Expected evolution with minimal developing hypodensity in the left basal ganglia at site of acute infarct on MRI, no associated edema. No evidence territorial infarct or progressive acute ischemia. Background atrophy and chronic small vessel ischemia is stable in the interim. No cerebral edema, extra-axial fluid collection, mass effect or midline shift. Atherosclerosis again seen of the skullbase vasculature. Tiny mucous retention cyst right maxillary sinus. Calvarium is intact.  IMPRESSION: 1. Expected evolution of the left basal ganglia infarct since prior exam. No interval hemorrhage, mass effect or midline shift. 2. Background chronic small vessel ischemia without CT findings of acute territorial infarct.  These results were called by telephone at the time of interpretation on 08/10/2015 at 12:11 am to Dr. Janann Colonel , who verbally acknowledged these results.   Electronically Signed   By: Jeb Levering M.D.   On: 08/10/2015 00:12    Assessment: 81 y.o. female hx of HTN, DM, recent CVA on 9/16 presenting with acute onset of right sided weakness and slurred speech. Per discussion with patient and family (see above) decision made to hold on tPA at this time. Will remain in the tPA window at this time. Will monitor closely.    Plan: 1. HgbA1c, fasting lipid panel 2. MRI, MRA  of the brain without contrast 3. PT consult, OT consult, Speech consult 4. Echocardiogram unremarkable on 9/17 5. Carotid dopplers - MRA neck on 9/17 shows > 80%  stenosis of left ICA with 60-70% stenosis on right side 6. Prophylactic therapy-continue ASA 81mg  daily and Plavix 75mg  daily 7. Risk factor modification 8. Telemetry monitoring 9. Frequent neuro checks 10. NPO until RN stroke swallow screen   Jim Like, DO [facility] [phone]  If 7pm- 7am, please page neurology on call as listed in [place]. 08/10/2015, 12:36 AM

## 2015-08-10 NOTE — ED Notes (Signed)
Responded to code stroke page at 2347, pt from home with [facility] with right sided weakness and facial droop.  Recent stroke with TPA treatment.  Initial NIHSS 4.  Dr Janann Colonel discussed case in detail with pt and pt's daughter via phone.  Decision made not to treat due to recent stroke and increased risk of ICH.  At [place], pt's symptoms worsened, right arm and leg had no effort against gravity.  Pt endorsed a desire for TPA.  Dr Janann Colonel returned to bedside and spoke again with pt and daughter.  While considered treating with TPA, pt's symptoms again improved and were noted to included only minor drift in both right extremities.  Decision to hold TPA was made in consultation with pt and daughter, who was at bedside by this time.  Pt and family updated by Dr Janann Colonel, ED and RR RN and agreeable with plan.  Bedside handoff with Junie Panning, primary ED RN.

## 2015-08-10 NOTE — Progress Notes (Signed)
Nights: Matt, RN, paged this NP earlier secondary to pt having a worsening NIHSS score since arrival to unit from ED. Neuro was already on board and this NP asked Matt to call neuro. Dr. Janann Colonel came to bedside, pt was taken to CT and then moved to 62M due to decline in progress. Neuro took over care of pt.  Clance Boll, NP [facility]

## 2015-08-10 NOTE — Progress Notes (Signed)
PULMONARY / CRITICAL CARE MEDICINE   Name: Molly [name] MRN: [id number] DOB: Mar 22, 1923    ADMISSION DATE:  08/09/2015 CONSULTATION DATE:  08/10/2015  REFERRING MD :  Family Medicine Service  CHIEF COMPLAINT: Hypotension   INITIAL PRESENTATION: 81 year old female with recent admission and discharge for acute infarction of left basal ganglia on 9/16 status post TPA with complete resolution of right-sided hemiparesis. Patient discharged on aspirin & Plavix. Patient reportedly awoke after going to bed around 11 PM with right-sided weakness & right facial droop.  STUDIES:  Portable CXR 9/28 - reviewed by me. Low lung volumes. No obvious opacity or pleural effusion. Mediastinum and heart normal in contour.   CT HEAD W/O 9/28 - no intracranial hemorrhage. Evolution of left basal ganglia infarct. No mass effect or midline shift.  SIGNIFICANT EVENTS: 9/28 - Admitted to hospital 9/28 - Transferred to ICU w/ transient hypotension & worsening neuro status   HISTORY OF PRESENT ILLNESS:  81 year old Caucasian female with recent admission for left basal ganglia infarct status post TPA. Patient readmitted for worsening neuro status. She awoke out of sleep with her acute changes. Denies any headache. Denies any chest pain or pressure. Denies any dyspnea or cough. Patient had transient hypotension with worsening of her neuro status earlier this morning. This resolved after a 500 mL fluid bolus. Denies any subjective fever, chills, or sweats. Patient had no sedating medications or antihypertensives before her decompensation. Transferred to the intensive care unit for closer monitoring & workup.  SUBJECTIVE: Unable to move right side when BP drops but otherwise no events.  VITAL SIGNS: Temp:  [97.4 F (36.3 C)-98.1 F (36.7 C)] 97.4 F (36.3 C) (09/28 0730) Pulse Rate:  [42-63] 54 (09/28 0900) Resp:  [10-28] 18 (09/28 0900) BP: (89-242)/(40-98) 187/60 mmHg (09/28 0900) SpO2:  [98 %-100 %] 99 %  (09/28 0900) Weight:  [84.8 kg (186 lb 15.2 oz)-86.002 kg (189 lb 9.6 oz)] 84.8 kg (186 lb 15.2 oz) (09/28 0320) HEMODYNAMICS:   VENTILATOR SETTINGS:   INTAKE / OUTPUT:  Intake/Output Summary (Last 24 hours) at 08/10/15 1022 Last data filed at 08/10/15 0600  Gross per 24 hour  Intake    560 ml  Output    450 ml  Net    110 ml   PHYSICAL EXAMINATION: General:  Awake. Alert. No acute distress.  Integument:  Warm & dry. No rash on exposed skin. Bruising left antecubital fossa. Lymphatics:  No appreciated cervical or supraclavicular lymphadenoapthy. HEENT:  Moist mucus membranes. No oral ulcers. No scleral injection or icterus. PERRL. Cardiovascular:  Regular rate. No edema. No appreciable JVD.  Pulmonary:  Good aeration & clear to auscultation bilaterally. Symmetric chest wall rise. No accessory muscle use on room air. Abdomen: Soft. Normal bowel sounds. Nondistended. Grossly nontender. Musculoskeletal:  Normal bulk and tone. Hand grip strength, biceps, dorsiflexion, & plantarflexion 5/5 bilaterally. Right & left leg rise 4-/5. No joint deformity or effusion appreciated. Neurological:  Right-sided facial droop. Oriented to person, place, & time. Psychiatric:  Mood and affect congruent. Speech normal rhythm, rate & tone.   LABS:  CBC  Recent Labs Lab 08/10/15 0022 08/10/15 0030  WBC  --  26.7*  HGB 15.0 13.5  HCT 44.0 41.4  PLT  --  278   Coag's  Recent Labs Lab 08/10/15 0030  APTT 31  INR 1.07   BMET  Recent Labs Lab 08/10/15 0022 08/10/15 0030  NA 135 134*  K 3.9 3.8  CL 99* 101  CO2  --  22  BUN 20 15  CREATININE 0.80 0.89  GLUCOSE 178* 183*   Electrolytes  Recent Labs Lab 08/10/15 0030  CALCIUM 9.7   Sepsis Markers  Recent Labs Lab 08/10/15 0819  PROCALCITON <0.10   ABG No results for input(s): PHART, PCO2ART, PO2ART in the last 168 hours. Liver Enzymes  Recent Labs Lab 08/10/15 0030  AST 24  ALT 22  ALKPHOS 80  BILITOT 0.6   ALBUMIN 3.4*   Cardiac Enzymes  Recent Labs Lab 08/10/15 0819  TROPONINI <0.03   Glucose  Recent Labs Lab 08/10/15 0356 08/10/15 0806  GLUCAP 175* 151*    Imaging Ct Head Wo Contrast  08/10/2015   CLINICAL DATA:  Code stroke. Right-sided weakness with right facial droop.  EXAM: CT HEAD WITHOUT CONTRAST  TECHNIQUE: Contiguous axial images were obtained from the base of the skull through the vertex without intravenous contrast.  COMPARISON:  Head CT 07/29/2015, brain MRI 07/30/2015  FINDINGS: No intracranial hemorrhage. Expected evolution with minimal developing hypodensity in the left basal ganglia at site of acute infarct on MRI, no associated edema. No evidence territorial infarct or progressive acute ischemia. Background atrophy and chronic small vessel ischemia is stable in the interim. No cerebral edema, extra-axial fluid collection, mass effect or midline shift. Atherosclerosis again seen of the skullbase vasculature. Tiny mucous retention cyst right maxillary sinus. Calvarium is intact.  IMPRESSION: 1. Expected evolution of the left basal ganglia infarct since prior exam. No interval hemorrhage, mass effect or midline shift. 2. Background chronic small vessel ischemia without CT findings of acute territorial infarct.  These results were called by telephone at the time of interpretation on 08/10/2015 at 12:11 am to Dr. Janann Colonel , who verbally acknowledged these results.   Electronically Signed   By: Jeb Levering M.D.   On: 08/10/2015 00:12   Dg Chest Portable 1 View  08/10/2015   CLINICAL DATA:  Weakness.  EXAM: PORTABLE CHEST 1 VIEW  COMPARISON:  07/29/2015  FINDINGS: Lung volumes are low. The cardiomediastinal contours are unchanged. Pulmonary vasculature is normal. No consolidation, pleural effusion, or pneumothorax. No acute osseous abnormalities are seen. Remote left rib fracture is unchanged.  IMPRESSION: No acute pulmonary process.   Electronically Signed   By: Jeb Levering  M.D.   On: 08/10/2015 02:27    ASSESSMENT / PLAN:  PULMONARY A: No acute issues.  P:   Monitor continuous pulse ox.  Monitor closely for airway protection. Titrate O2 for sat of 88-92%. SLP today.  CARDIOVASCULAR A:  Shock - resolved.  H/O HTN  P:  Monitor vitals.  Trending troponin I Checking transthoracic echocardiogram  RENAL A:   No acute issues.  P:   Monitor UOP. Daily BMET. Replace electrolytes as indicated.  GASTROINTESTINAL A:   No acute issues.  P:   NPO except meds for now. Swallow evaluation and advance diet per speech recommendations.  HEMATOLOGIC A:   Leukocytosis - Likely secondary to CLL H/O CLL  P:  Monitor leukocytosis on CBC daily SCDs   INFECTIOUS A:  No acute process.  P:   Procalcitonin algorithm. Monitor off abx.  ENDOCRINE A:   H/O DM H/O Hypothryoidism    P:   Sliding scale insulin algorithm Accuchecks q4hr  NEUROLOGIC A:   Worsening dysarthria & weakness Recent Left Basal Ganglia CVA - s/p TPA on prior admission  P:   ASA Plavix MRI today Further recs per neurology  FAMILY  - Updates: No family at bedside.  - Inter-disciplinary family  meet or Palliative Care meeting due by:  10/4  The patient is critically ill with multiple organ systems failure and requires high complexity decision making for assessment and support, frequent evaluation and titration of therapies, application of advanced monitoring technologies and extensive interpretation of multiple databases.   Critical Care Time devoted to patient care services described in this note is  45  Minutes. This time reflects time of care of this signee Dr Jennet Maduro. This critical care time does not reflect procedure time, or teaching time or supervisory time of PA/NP/Med student/Med Resident etc but could involve care discussion time.  Rush Farmer, M.D. [facility] Pulmonary/Critical Care Medicine. Pager: [phone]. After hours pager: [phone].

## 2015-08-10 NOTE — Progress Notes (Signed)
STROKE TEAM PROGRESS NOTE  HPI  Molly [name] is an 81 y.o. female hx of HTN, DM, recent CVA admitted with acute onset of right sided weakness and slurred speech. LSW at 2250 when she went to bed, shortly later she called out to her daughter that she was having a stroke. Daughter noted right sided weakness and slurred speech. EMS called. CT head imaging reviewed and no signs of acute stroke.   Patient was admitted on 9/16 with an acute stroke. At that time had right sided weakness though noted to be worse than current presentation. She received tPA at that time and per daughter was asymptomatic upon discharge.   Had extensive discussion with patient and her daughter Molly [name] in regards to tPA in this case. Counseled them that tPA is a relative contraindication due to recent stroke on 9/16. Upon initial presentation her symptoms were mild (NIHSS of 4) with mild right sided weakness. At that time they stated they wished to hold off on tPA. Shortly later her right sided weakness worsened to a flaccid weakness. Again discussed tPA risks/benefits and they wished to go ahead with tPA at that time. Prior to initiation of tPA her strength improved and they again wished to hold on tPA. Counseled them on concern that she may worsen again vs risk of ICH. They express understanding and wish to hold on tPA at this time.   Date last known well: 9/27 Time last known well: 2250 tPA Given: no, family and patient wished to hold off due to increased bleed risk Modified Rankin: Rankin Score=0   SUBJECTIVE (INTERVAL HISTORY) Her daughter is at the bedside.  Overall she feels her condition is stable. She still has right facial droop and mild drift of RUE and RLE. BP still high at 180-200.   As per pt, she took her BP meds clonidine before went to bed. Not long after, she found that she can not move right UE and LE while in bed. Pt called daughter who called EMS. On EMS arrival, pt BP with high at 200s. In ED, symptom  improved and no tPA due to pt had stroke 2 weeks ago. However, pt got worse while in ER with right side flaccid, tPA was re-considered but by the tPA given, pt much improved again and tPA was on hold according to pt wishes. She did have episode of low BP to 80s at 4:30am but not sure if there was any issues associated with the low BP.    OBJECTIVE Temp:  [97.4 F (36.3 C)-98.1 F (36.7 C)] 97.4 F (36.3 C) (09/28 0730) Pulse Rate:  [42-63] 57 (09/28 0700) Cardiac Rhythm:  [-] Sinus bradycardia (09/28 0645) Resp:  [10-28] 18 (09/28 0700) BP: (89-242)/(40-98) 184/62 mmHg (09/28 0700) SpO2:  [98 %-100 %] 100 % (09/28 0700) Weight:  [84.8 kg (186 lb 15.2 oz)-86.002 kg (189 lb 9.6 oz)] 84.8 kg (186 lb 15.2 oz) (09/28 0320)  CBC:  Recent Labs Lab 08/10/15 0022 08/10/15 0030  WBC  --  26.7*  NEUTROABS  --  9.3*  HGB 15.0 13.5  HCT 44.0 41.4  MCV  --  92.0  PLT  --  921    Basic Metabolic Panel:  Recent Labs Lab 08/10/15 0022 08/10/15 0030  NA 135 134*  K 3.9 3.8  CL 99* 101  CO2  --  22  GLUCOSE 178* 183*  BUN 20 15  CREATININE 0.80 0.89  CALCIUM  --  9.7    Lipid Panel:  Component Value Date/Time   CHOL 186 07/30/2015 0239   TRIG 182* 07/30/2015 0239   HDL 36* 07/30/2015 0239   CHOLHDL 5.2 07/30/2015 0239   VLDL 36 07/30/2015 0239   LDLCALC 114* 07/30/2015 0239   HgbA1c:  Lab Results  Component Value Date   HGBA1C 7.8* 07/30/2015   Urine Drug Screen:    Component Value Date/Time   LABOPIA NONE DETECTED 08/10/2015 0106   COCAINSCRNUR NONE DETECTED 08/10/2015 0106   LABBENZ NONE DETECTED 08/10/2015 0106   AMPHETMU NONE DETECTED 08/10/2015 0106   THCU NONE DETECTED 08/10/2015 0106   LABBARB NONE DETECTED 08/10/2015 0106      IMAGING  Ct Head Wo Contrast 08/10/2015    1. Expected evolution of the left basal ganglia infarct since prior exam. No interval hemorrhage, mass effect or midline shift.  2. Background chronic small vessel ischemia without CT  findings of acute territorial infarct.    MRI head and MRA brain - pending  Dg Chest Portable 1 View 08/10/2015    No acute pulmonary process.     Mri and Mra head and neck W Wo Contrast 07/30/2015 IMPRESSION: Acute infarction affecting the left basal ganglia and radiating white matter tracts. No swelling or hemorrhage. Extensive chronic small vessel ischemic changes elsewhere throughout the brain. No carotid bifurcation disease. 30-50% stenoses at both vertebral artery origins. Severe atherosclerotic disease intracranially. Advanced disease in the siphon regions and supraclinoid internal carotid arteries with stenosis of the left supraclinoid internal carotid artery of 80% or greater. Stenosis on the right is 60-70%. Approximately 50% stenoses in the distal vertebral arteries and in the proximal basilar artery. Diffuse atherosclerotic narrowing and irregularity in the smaller intracranial branches.  2D ehco 07/30/15 - - Left ventricle: The cavity size was normal. There was moderate concentric hypertrophy. Systolic function was vigorous. The estimated ejection fraction was in the range of 65% to 70%. Wall motion was normal; there were no regional wall motion abnormalities. Doppler parameters are consistent with abnormal left ventricular relaxation (grade 1 diastolic dysfunction). Doppler parameters are consistent with high ventricular filling pressure. - Aortic valve: Trileaflet; moderately thickened, moderately calcified leaflets especially non coronary cusp. Valve mobility was mildly restricted. There was very mild stenosis. Peak velocity (S): 244 cm/s. Mean gradient (S): 13 mm Hg. Valve area (VTI): 1.42 cm^2. Valve area (Vmax): 1.35 cm^2. Valve area (Vmean): 1.17 cm^2. - Mitral valve: Calcified annulus Impressions: - No cardiac source of emboli was indentified. When compared to prior, mild aortic stenosis is now present.   PHYSICAL EXAM  Temp:  [97.2  F (36.2 C)-98.1 F (36.7 C)] 97.2 F (36.2 C) (09/28 1138) Pulse Rate:  [42-63] 56 (09/28 1100) Resp:  [10-28] 15 (09/28 1100) BP: (89-242)/(40-98) 174/53 mmHg (09/28 1100) SpO2:  [98 %-100 %] 98 % (09/28 1100) Weight:  [186 lb 15.2 oz (84.8 kg)-189 lb 9.6 oz (86.002 kg)] 186 lb 15.2 oz (84.8 kg) (09/28 0320)  General - Well nourished, well developed, in no apparent distress.  Ophthalmologic - Fundi not visualized due to eye movement.  Cardiovascular - Regular rhythm with no murmur, but bradycardia.  Mental Status -  Level of arousal and orientation to time, place, and person were intact. Language including expression, naming, repetition, comprehension was assessed and found intact. Fund of Knowledge was assessed and was intact.  Cranial Nerves II - XII - II - Visual field intact OU. III, IV, VI - Extraocular movements intact. V - Facial sensation intact bilaterally. VII - right facial droop. VIII -  Hearing & vestibular intact bilaterally. X - Palate elevates symmetrically. XI - Chin turning & shoulder shrug intact bilaterally. XII - Tongue protrusion intact.  Motor Strength - The patient's strength was 4/5 RUE and RLE and pronator drift was present on the right.  LUE and LLE 5/5/. Bulk was normal and fasciculations were absent.   Motor Tone - Muscle tone was assessed at the neck and appendages and was normal.  Reflexes - The patient's reflexes were 1+ in all extremities and she had no pathological reflexes.  Sensory - Light touch, temperature/pinprick were assessed and were symmetrical.    Coordination - The patient had normal movements in the hands and feet with no ataxia or dysmetria.    Gait and Station - deferred due to high BP.    ASSESSMENT/PLAN Ms. PRESTINA RAIGOZA is a 81 y.o. female with history of hypertension, hyperlipidemia, history of right breast cancer, diabetes mellitus, and recent left CR stroke treated with TPA on 07/29/15 presenting with acute onset  of right-sided weakness and slurred speech. She did not receive IV t-PA due to patient and family decision not to treat. Symptoms improved.  Stroke: likely left MCA territory infarct, most likely due to left terminal ICA high grade stenosis in the setting of BP fluctuation.   Resultant  Right facial drop and right mild hemiparesis  MRI  pending  MRA head pending  MRA neck 07/30/2015 revealed no significant disease  2D Echo 07/30/2015 - EF 65-70%. No cardiac source of emboli identified.  LDL 07/30/2015 - 114  HgbA1c 07/30/2015 - 7.8  VTE prophylaxis - SCDs  Diet NPO time specified Except for: Sips with Meds  aspirin 81 mg orally every day and clopidogrel 75 mg orally every day prior to admission, now on aspirin 81 mg orally every day and clopidogrel 75 mg orally every day  Patient counseled to be compliant with her antithrombotic medications  Ongoing aggressive stroke risk factor management  Therapy recommendations: Pending  Disposition: Pending  B/l intracranial ICA stenosis  MRA showed left > 80% stenosis and right 60% stenosis  Due to recurrent left MCA infarcts, may consider left MCA stenting  However, due to her advanced age and metastasis breast Ca, will hold off that for now.   BP goal 140-160  Hypertension  Blood pressure somewhat high  Permissive hypertension (OK if < 220/120) but gradually normalize in 5-7 days Need gentle BP management due to left ICA terminus high grade stenosis  Hyperlipidemia  Home meds:  Zetia resumed in hospital  LDL 114, goal < 70  Lipitor intolerance  On Zetia, continue Zetia on discharge.  Diabetes  Home meds - lantus  HgbA1c 7.8, goal < 7.0  Uncontrolled  SSI  Resume lantus once on diet.  Other Stroke Risk Factors  Advanced age  Obesity, Body mass index is 32.07 kg/(m^2).   Hx stroke/TIA   Other Active Problems  Contrast allergy   Leukocytosis - 26.7 - due to metastasis breast cancer  Hospital day  # 0  This patient is critically ill due to labile BP and stroke due to high grade stenosis of left tICA, highly BP dependent and at significant risk of neurological worsening, death form recurrent infarcts, ICH due to high BP and cerebral edema. This patient's care requires constant monitoring of vital signs, hemodynamics, respiratory and cardiac monitoring, review of multiple databases, neurological assessment, discussion with family, other specialists and medical decision making of high complexity. I spent 40 minutes of neurocritical care time in the  care of this patient.  Rosalin Hawking, MD PhD Stroke Neurology 08/10/2015 12:28 PM      To contact Stroke Continuity Galileah Piggee, please refer to [email or web address] After hours, contact General Neurology

## 2015-08-11 DIAGNOSIS — F411 Generalized anxiety disorder: Secondary | ICD-10-CM

## 2015-08-11 DIAGNOSIS — I10 Essential (primary) hypertension: Secondary | ICD-10-CM

## 2015-08-11 DIAGNOSIS — Z8673 Personal history of transient ischemic attack (TIA), and cerebral infarction without residual deficits: Secondary | ICD-10-CM

## 2015-08-11 DIAGNOSIS — I119 Hypertensive heart disease without heart failure: Secondary | ICD-10-CM

## 2015-08-11 DIAGNOSIS — I63512 Cerebral infarction due to unspecified occlusion or stenosis of left middle cerebral artery: Secondary | ICD-10-CM

## 2015-08-11 LAB — BASIC METABOLIC PANEL
ANION GAP: 10 (ref 5–15)
BUN: 12 mg/dL (ref 6–20)
CO2: 23 mmol/L (ref 22–32)
Calcium: 8.9 mg/dL (ref 8.9–10.3)
Chloride: 102 mmol/L (ref 101–111)
Creatinine, Ser: 0.84 mg/dL (ref 0.44–1.00)
GFR calc Af Amer: >60 mL/min (ref >60)
GFR, EST NON AFRICAN AMERICAN: 60 mL/min — AB (ref >60)
Glucose, Bld: 173 mg/dL — ABNORMAL HIGH (ref 65–99)
POTASSIUM: 3.4 mmol/L — AB (ref 3.5–5.1)
SODIUM: 135 mmol/L (ref 135–145)

## 2015-08-11 LAB — GLUCOSE, CAPILLARY
GLUCOSE-CAPILLARY: 197 mg/dL — AB (ref 65–99)
GLUCOSE-CAPILLARY: 179 mg/dL — AB (ref 65–99)
GLUCOSE-CAPILLARY: 173 mg/dL — AB (ref 65–99)
GLUCOSE-CAPILLARY: 191 mg/dL — AB (ref 65–99)

## 2015-08-11 LAB — CBC
HCT: 37.6 % (ref 36.0–46.0)
Hemoglobin: 12 g/dL (ref 12.0–15.0)
MCH: 29.1 pg (ref 26.0–34.0)
MCHC: 31.9 g/dL (ref 30.0–36.0)
MCV: 91 fL (ref 78.0–100.0)
PLATELETS: 282 10*3/uL (ref 150–400)
RBC: 4.13 MIL/uL (ref 3.87–5.11)
RDW: 13.8 % (ref 11.5–15.5)
WBC: 26.5 10*3/uL — AB (ref 4.0–10.5)

## 2015-08-11 LAB — TROPONIN I: Troponin I: <0.03 ng/mL (ref <0.031)

## 2015-08-11 MED ORDER — CLONAZEPAM 0.5 MG PO TABS
0.5000 mg | ORAL_TABLET | Freq: Two times a day (BID) | ORAL | Status: DC
  Administered 2015-08-11 – 2015-08-15 (×7): 0.5 mg via ORAL
  Filled 2015-08-11 (×8): qty 1

## 2015-08-11 MED ORDER — AMLODIPINE BESYLATE-VALSARTAN 10-320 MG PO TABS: 1.0000 | ORAL_TABLET | Freq: Every day | ORAL | Status: DC

## 2015-08-11 MED ORDER — AMLODIPINE BESYLATE 10 MG PO TABS
10.0000 mg | ORAL_TABLET | Freq: Every day | ORAL | Status: DC
  Administered 2015-08-11 – 2015-08-18 (×8): 10 mg via ORAL
  Filled 2015-08-11 (×9): qty 1

## 2015-08-11 MED ORDER — ATENOLOL 25 MG PO TABS
25.0000 mg | ORAL_TABLET | Freq: Three times a day (TID) | ORAL | Status: DC
  Administered 2015-08-11 (×3): 25 mg via ORAL
  Filled 2015-08-11 (×6): qty 1

## 2015-08-11 MED ORDER — IRBESARTAN 300 MG PO TABS
300.0000 mg | ORAL_TABLET | Freq: Every day | ORAL | Status: DC
  Administered 2015-08-11 – 2015-08-18 (×8): 300 mg via ORAL
  Filled 2015-08-11 (×9): qty 1

## 2015-08-11 MED ORDER — POTASSIUM CHLORIDE CRYS ER 20 MEQ PO TBCR
20.0000 meq | EXTENDED_RELEASE_TABLET | ORAL | Status: AC
  Administered 2015-08-11 (×2): 20 meq via ORAL
  Filled 2015-08-11 (×2): qty 1

## 2015-08-11 NOTE — Clinical Documentation Improvement (Signed)
Critical Care  Can the diagnosis of Shock be further specified?   Shock, including Type:  Neurogenic, Cardiogenic, Hypovolemic,  Other Type,   Other  Clinically Undetermined  Document any associated diagnoses/conditions.  Supporting Information:  Patient hypotensive - 89/40 with Map of 60, bradycardic with HR ranging from 41 to 55, tachypneic with RR ranging from 21 to 28  Fluid resuscitation given  Please exercise your independent, professional judgment when responding. A specific answer is not anticipated or expected.  Thank You,  Zoila Shutter BSN, [place] [phone]

## 2015-08-11 NOTE — Evaluation (Signed)
Speech Language Pathology Evaluation Patient Details Name: Molly [name] MRN: [id number] DOB: Dec 02, 1922 Today's Date: 08/11/2015 Time: 4562-5638 SLP Time Calculation (min) (ACUTE ONLY): 11 min  Problem List:  Patient Active Problem List   Diagnosis Date Noted  . CVA (cerebral vascular accident) 08/10/2015  . Abnormal WBC count   . Shock   . Malignant essential hypertension   . Intracranial carotid stenosis   . CVA (cerebral infarction) 07/29/2015  . Abdominal pain, acute 06/19/2015  . Lactic acidosis 06/19/2015  . CLL (chronic lymphocytic leukemia) 06/19/2015  . Abdominal pain 06/19/2015  . Left flank pain 06/19/2015  . UTI (lower urinary tract infection) 05/11/2015  . Dehydration with hyponatremia 05/11/2015  . Hypothyroidism 05/11/2015  . Aortic stenosis, mild 09/07/2014  . Diverticulosis of colon without hemorrhage 07/11/2014  . Colon polyps 07/10/2014  . GI bleed 07/07/2014  . Syncope 07/07/2014  . HTN (hypertension) 07/07/2014  . NSTEMI (non-ST elevated myocardial infarction) 07/07/2014  . Anemia, blood loss 07/06/2014  . Carotid artery bruit 08/28/2013  . Heart murmur, systolic 93/73/4287  . RBBB 02/16/2013  . Type 2 diabetes mellitus with peripheral neuropathy 02/16/2013  . Benign hypertensive heart disease without heart failure 08/02/2011  . Pure hypercholesterolemia 08/02/2011  . Asymptomatic PVCs 08/02/2011  . Breast cancer metastasized to bone 08/02/2011   Past Medical History:  Past Medical History  Diagnosis Date  . Hypertension   . MVP (mitral valve prolapse)   . Hypothyroidism   . Anemia   . Hypercholesterolemia   . Heart murmur   . Type II diabetes mellitus   . Arthritis     "back; knees, hands" (05/11/2015)  . Breast cancer, right breast     "cells went into the blood and caused her hip to break" (05/11/2015)   Past Surgical History:  Past Surgical History  Procedure Laterality Date  . Hemiarthroplasty hip Right     "partial replacement"   . Pelvic and para-aortic lymph node dissection    . Tonsillectomy    . Appendectomy    . Cholecystectomy    . US echocardiography  03/30/2009    EF 55-60%  . Cardiovascular stress test  01/28/2008    EF 74%  . Esophagogastroduodenoscopy N/A 07/08/2014    Procedure: ESOPHAGOGASTRODUODENOSCOPY (EGD);  Surgeon: Winfield Cunas., MD;  Location: [facility];  Service: Endoscopy;  Laterality: N/A;  . Colonoscopy N/A 07/10/2014    Procedure: COLONOSCOPY;  Surgeon: Lear Ng, MD;  Location: [facility];  Service: Endoscopy;  Laterality: N/A;  . Total abdominal hysterectomy      w/BSO  . Dilation and curettage of uterus    . Breast biopsy Right 2009  . Breast lumpectomy Right 2009  . Fracture surgery    . Cataract extraction w/ intraocular lens  implant, bilateral Bilateral    HPI:  81 year old female with recent admission and discharge for acute infarction of left basal ganglia on 9/16 status post TPA with complete resolution of right-sided hemiparesis. Re-admitted 9/28 wiht right sided weakness and facial droop. CT head: Expected evolution of the left basal ganglia infarct since prior   Assessment / Plan / Recommendation Clinical Impression  Cognitive-linguistic evaluation complete. Cognitive function appearing at baseline and [facility] for tasks assessed. Primary deficit at this time is dysarthria present due to right sided facial weakness. Patient will benefit from skilled SLP treatment to maximize speech intelligibility. Recommend SLP services continue after d/c.       SLP Assessment  Patient needs continued [place] [facility] Services  Follow Up Recommendations  Inpatient Rehab    Frequency and Duration min 2x/week  2 weeks   Pertinent Vitals/Pain Pain Assessment: No/denies pain   SLP Goals  Potential to Achieve Goals (ACUTE ONLY): Good  SLP Evaluation Prior Functioning  Cognitive/Linguistic Baseline: Baseline deficits Baseline deficit details: pt reports  mild attentional deficits at baseline, daughter helps with organization for medication management. Type of Home: House  Lives With: Daughter Available Help at Discharge: Family;Friend(s);Available 24 hours/day   Cognition  Overall Cognitive Status: Within Functional Limits for tasks assessed Orientation Level: Oriented X4    Comprehension  Auditory Comprehension Overall Auditory Comprehension: Appears within functional limits for tasks assessed Visual Recognition/Discrimination Discrimination: Within Function Limits Reading Comprehension Reading Status: Within funtional limits    Expression Expression Primary Mode of Expression: Verbal Verbal Expression Overall Verbal Expression: Appears within functional limits for tasks assessed Written Expression Dominant Hand: Right   Oral / Motor Oral Motor/Sensory Function Overall Oral Motor/Sensory Function: Impaired (CN VII (facial) and XII (hypoglossal) dysfunction on right) Motor Speech Overall Motor Speech: Impaired Respiration: Within functional limits Phonation: Normal Resonance: Within functional limits Articulation: Impaired Level of Impairment: Word Intelligibility: Intelligibility reduced Word: 75-100% accurate Phrase: 75-100% accurate Sentence: 75-100% accurate Conversation: 75-100% accurate Motor Planning: Witnin functional limits Effective Techniques: Slow rate;Over-articulate   GO   Gabriel Rainwater MA, CCC-SLP [phone]   McCoy [name] 08/11/2015, 1:32 PM

## 2015-08-11 NOTE — Progress Notes (Signed)
STROKE TEAM PROGRESS NOTE   SUBJECTIVE (INTERVAL HISTORY) Molly [name] daughter is at the bedside. Pt is very stressful and anxious about Molly [name] BP which was around 190s since admission. She also complains of left UE pain due to ecchymosis from BP cuff. She also complain of left sided headache, mild.  As per daughter, pt is very worrying person at home. She worries about Molly [name] to be left alone at home, and she worries about something happens to Molly [name] daughter. She also was depressed lately as she can not afford for somebody to help Molly [name] at home or getting into assistant living facility. She sleeps more and complains dizzy when got up. She did not work well with therapist. She checks BP at home very often. She is not doing things she was interested before. And she is worrying about going home. She feels Molly [name] dysarthria and right sided weakness worse today.   Earlier today when I check on Molly [name], Molly [name] right UE and LE strength was 4+/5, while when daughter came in, she seems more depressed and anxious, and complain of worsening symptoms. BP 192/89, and RUE and RLE 3+/5.    OBJECTIVE Temp:  [97.2 F (36.2 C)-98.6 F (37 C)] 98.5 F (36.9 C) (09/29 0735) Pulse Rate:  [56-66] 66 (09/29 0800) Cardiac Rhythm:  [-] Sinus bradycardia (09/29 0800) Resp:  [14-23] 20 (09/29 0800) BP: (171-196)/(49-145) 196/52 mmHg (09/29 0800) SpO2:  [98 %-100 %] 99 % (09/29 0800)  CBC:   Recent Labs Lab 08/10/15 0030 08/11/15 0230  WBC 26.7* 26.5*  NEUTROABS 9.3*  --   HGB 13.5 12.0  HCT 41.4 37.6  MCV 92.0 91.0  PLT 278 235    Basic Metabolic Panel:   Recent Labs Lab 08/10/15 0030 08/11/15 0230  NA 134* 135  K 3.8 3.4*  CL 101 102  CO2 22 23  GLUCOSE 183* 173*  BUN 15 12  CREATININE 0.89 0.84  CALCIUM 9.7 8.9    Lipid Panel:     Component Value Date/Time   CHOL 186 07/30/2015 0239   TRIG 182* 07/30/2015 0239   HDL 36* 07/30/2015 0239   CHOLHDL 5.2 07/30/2015 0239   VLDL 36 07/30/2015 0239   LDLCALC 114*  07/30/2015 0239   HgbA1c:  Lab Results  Component Value Date   HGBA1C 7.8* 07/30/2015   Urine Drug Screen:     Component Value Date/Time   LABOPIA NONE DETECTED 08/10/2015 0106   COCAINSCRNUR NONE DETECTED 08/10/2015 0106   LABBENZ NONE DETECTED 08/10/2015 0106   AMPHETMU NONE DETECTED 08/10/2015 0106   THCU NONE DETECTED 08/10/2015 0106   LABBARB NONE DETECTED 08/10/2015 0106      IMAGING  Ct Head Wo Contrast 08/10/2015    1. Expected evolution of the left basal ganglia infarct since prior exam. No interval hemorrhage, mass effect or midline shift.  2. Background chronic small vessel ischemia without CT findings of acute territorial infarct.    MRI head and MRA brain - pending  Dg Chest Portable 1 View 08/10/2015    No acute pulmonary process.     Mri and Mra head and neck W Wo Contrast 07/30/2015 IMPRESSION: Acute infarction affecting the left basal ganglia and radiating white matter tracts. No swelling or hemorrhage. Extensive chronic small vessel ischemic changes elsewhere throughout the brain. No carotid bifurcation disease. 30-50% stenoses at both vertebral artery origins. Severe atherosclerotic disease intracranially. Advanced disease in the siphon regions and supraclinoid internal carotid arteries with stenosis of the left supraclinoid internal carotid artery of  80% or greater. Stenosis on the right is 60-70%. Approximately 50% stenoses in the distal vertebral arteries and in the proximal basilar artery. Diffuse atherosclerotic narrowing and irregularity in the smaller intracranial branches.  Mri and Mra Head Wo Contrast 08/10/2015   IMPRESSION: 1. Expected evolution of the recent left MCA white matter infarct. No interval hemorrhage. No associated mass effect. 2. No new intracranial abnormality. 3. Stable severe intracranial atherosclerosis on MRA. Moderate to high-grade stenoses of the distal left vertebral artery, both distal ICA siphons, and the proximal basilar  artery.   2D ehco 07/30/15 - - Left ventricle: The cavity size was normal. There was moderate concentric hypertrophy. Systolic function was vigorous. The estimated ejection fraction was in the range of 65% to 70%. Wall motion was normal; there were no regional wall motion abnormalities. Doppler parameters are consistent with abnormal left ventricular relaxation (grade 1 diastolic dysfunction). Doppler parameters are consistent with high ventricular filling pressure. - Aortic valve: Trileaflet; moderately thickened, moderately calcified leaflets especially non coronary cusp. Valve mobility was mildly restricted. There was very mild stenosis. Peak velocity (S): 244 cm/s. Mean gradient (S): 13 mm Hg. Valve area (VTI): 1.42 cm^2. Valve area (Vmax): 1.35 cm^2. Valve area (Vmean): 1.17 cm^2. - Mitral valve: Calcified annulus Impressions: - No cardiac source of emboli was indentified. When compared to prior, mild aortic stenosis is now present.   PHYSICAL EXAM  Temp:  [97.2 F (36.2 C)-98.6 F (37 C)] 98.5 F (36.9 C) (09/29 0735) Pulse Rate:  [56-66] 66 (09/29 0800) Resp:  [14-23] 20 (09/29 0800) BP: (171-196)/(49-145) 196/52 mmHg (09/29 0800) SpO2:  [98 %-100 %] 99 % (09/29 0800)  General - Well nourished, well developed, in no apparent distress.  Ophthalmologic - Fundi not visualized due to eye movement.  Cardiovascular - Regular rhythm with no murmur, but bradycardia.  Mental Status -  Level of arousal and orientation to time, place, and person were intact. Language including expression, naming, repetition, comprehension was assessed and found intact. Fund of Knowledge was assessed and was intact.  Cranial Nerves II - XII - II - Visual field intact OU. III, IV, VI - Extraocular movements intact. V - Facial sensation intact bilaterally. VII - right facial droop. VIII - Hearing & vestibular intact bilaterally. X - Palate elevates  symmetrically. XI - Chin turning & shoulder shrug intact bilaterally. XII - Tongue protrusion intact.  Motor Strength - The patient's strength was 4+/5 RUE and RLE earlier on the round but became 3+/5 RUE and RLE after daughter came in and with anxiety and depression.  LUE and LLE 5/5/. Bulk was normal and fasciculations were absent.   Motor Tone - Muscle tone was assessed at the neck and appendages and was normal.  Reflexes - The patient's reflexes were 1+ in all extremities and she had no pathological reflexes.  Sensory - Light touch, temperature/pinprick were assessed and were symmetrical.    Coordination - The patient had normal movements in the hands and feet with no ataxia or dysmetria.    Gait and Station - deferred due to high BP.   ASSESSMENT/PLAN Molly [name] is a 81 y.o. female with history of hypertension, hyperlipidemia, history of right breast cancer, diabetes mellitus, and recent left CR stroke treated with TPA on 07/29/15 presenting with acute onset of right-sided weakness and slurred speech. She did not receive IV t-PA due to patient and family decision not to treat. Symptoms improved.  Recrudescence of infarct two weeks ago - likely left MCA  territory infarct, most likely due to left terminal ICA high grade stenosis in the setting of BP fluctuation.   Resultant  Right facial drop and right mild hemiparesis  MRI evolution of previous stroke and no new stroke  MRA head stable high grade stenosis on the bilateral ICA siphone, L>R  MRA neck 07/30/2015 revealed no significant disease  2D Echo 07/30/2015 - EF 65-70%. No cardiac source of emboli identified.  LDL 07/30/2015 - 114  HgbA1c 07/30/2015 - 7.8  VTE prophylaxis - SCDs Diet Carb Modified Fluid consistency:: Thin; Room service appropriate?: Yes  aspirin 81 mg orally every day and clopidogrel 75 mg orally every day prior to admission, now on aspirin 81 mg orally every day and clopidogrel 75 mg orally  every day.  Patient counseled to be compliant with Molly [name] antithrombotic medications  Ongoing aggressive stroke risk factor management  Therapy recommendations: Pending  Disposition: Pending  B/l intracranial ICA stenosis  MRA showed left > 80% stenosis and right 60% stenosis  Due to recurrent left MCA infarcts, may consider left MCA stenting  However, due to Molly [name] advanced age and metastasis breast Ca, will hold off that for now.   BP goal 140-160  History of left MCA territory infarct 2 weeks ago  S/p tPA  Most likely due to left terminal ICA high grade stenosis in the setting of hypotension  No significant residue on discharge   On ASA and plavix and zetia  Discharged with home health  Hypertension  Blood pressure somewhat high  Permissive hypertension (OK if < 220/120) but gradually normalize in 5-7 days gentle BP management Resume amlodipine and losartan, but atenolol half dose  Anxiety   Pt very anxious about Molly [name] condition, about going home, about Molly [name] BP, about Molly [name] medications  Likely to explain Molly [name] recrudescence of Molly [name] previous stroke symptoms  Will add clonazepam bid toay  Hyperlipidemia  Home meds:  Zetia resumed in hospital  LDL 114, goal < 70  Lipitor intolerance  On Zetia, continue Zetia on discharge.  Diabetes  Home meds - lantus  HgbA1c 7.8, goal < 7.0  Uncontrolled  SSI  Resume lantus once on diet.  Other Stroke Risk Factors  Advanced age  Obesity, Body mass index is 32.07 kg/(m^2).   Hx stroke/TIA   Other Active Problems  Contrast allergy   Leukocytosis - 26.7 - due to metastasis breast cancer  Hospital day # 1  Had long discussion with pt and daughter at bedside regarding Molly [name] anxiety and depression as well as Molly [name] overall treatment plan. She was encouraged with relaxation and cope with stress. Will start on clonazepam. PT/OT/speech. She needs ongoing stroke evaluation, BP monitoring and aggressive risk factor  modification.   Rosalin Hawking, MD PhD Stroke Neurology 08/11/2015 10:18 AM      To contact Stroke Continuity provider, please refer to [email or web address] After hours, contact General Neurology

## 2015-08-11 NOTE — Progress Notes (Signed)
Dr. Leonel Ramsay paged to let MD know patient's daughter is concerned that patient's CVA is much worse than yesterday.

## 2015-08-11 NOTE — Progress Notes (Signed)
Dr. Leonel Ramsay on phone with patient's daughter.  Dr. Leonel Ramsay aware of patient's asymmetrical smile, frown, tongue placement (deviates slightly to right), pupil size and reaction, weakness in RUE and RLE.  Also, the ICU RN, Nira Conn and I conducted a joint neuro assessment upon arrival to [place] room 6, and that my initial assessment findings were consistent with earlier shift assessments today.  Pt's speech remains slurred.

## 2015-08-11 NOTE — Progress Notes (Signed)
Speech Language Pathology Treatment: Dysphagia  Patient Details Name: Molly [name] MRN: [id number] DOB: 07/25/1923 Today's Date: 08/11/2015 Time: 7867-5449 SLP Time Calculation (min) (ACUTE ONLY): 8 min  Assessment / Plan / Recommendation Clinical Impression  Treatment focused on diet tolerance following swallowing evaluation 9/28. Patient alert and cooperative, reported increased anxiety as well as an increase in symptoms (right sided facial droop/weakness and dysarthria). Po trials provided to assess for changes in swallowing function. Overall function appears consistent with that during initial evaluation in which patient with mild right sided buccal residue in which she independently clears with use of lingual sweep as well as intermittent anterior labial spillage with awareness, requiring min verbal cues to decrease with smaller sip size and tight labial seal. Patient continues to appear to be protecting her airway and appears to be tolerating current diet. Will continue current diet and precautions and f/u briefly given possibility of worsening symptoms.    HPI Other Pertinent Information: 81 year old female with recent admission and discharge for acute infarction of left basal ganglia on 9/16 status post TPA with complete resolution of right-sided hemiparesis. Re-admitted 9/28 wiht right sided weakness and facial droop. CT head: Expected evolution of the left basal ganglia infarct since prior   Pertinent Vitals Pain Assessment: No/denies pain  SLP Plan  Continue with current plan of care    Recommendations Diet recommendations: Regular;Thin liquid Liquids provided via: Cup;Straw Medication Administration: Whole meds with liquid Supervision: Patient able to self feed;Intermittent supervision to cue for compensatory strategies Compensations: Small sips/bites;Slow rate;Check for pocketing;Check for anterior loss Postural Changes and/or Swallow Maneuvers: Seated upright 90 degrees               Oral Care Recommendations: Oral care BID Follow up Recommendations: Inpatient Rehab Plan: Continue with current plan of care    [place], [place] [phone]   [place] 08/11/2015, 1:35 PM

## 2015-08-11 NOTE — Evaluation (Signed)
Physical Therapy Evaluation Patient Details Name: Molly [name] MRN: [id number] DOB: [birth year] Today's Date: 08/11/2015   History of Present Illness  81 y/o female w PMHx of HTN, DM met. breast CA, hemiarthroplasty R hip, admitted with slurred speech, facial droop and right side weakness, MRI showing acute infarction affecting the left basal ganglia and radiating white matter tracts on 9/16 s/p TPA.    She  dc'd home with complete resolution of her right sided hemiparesis.  She returns day of admission with right sided weakness and right facial dropping. She went to bed around 11pm. Woke up a couple of hours later with above symptoms. Her symptoms have waxed and waned in the ED, and pt was held in ED to decide if tpa was going to be given again. At this time her right sided weakness has much improved. She denies any pain at this time. No headache. Referred for admission for CVA. Neurology consulted and has seen the patient and advised against tpa at this time.  Clinical Impression  Pt admitted with/for recurring R sided weakness, R facial droop.  Pt currently limited functionally due to the problems listed. ( See problems list.)   Pt will benefit from PT to maximize function and safety in order to get ready for next venue listed below.     Follow Up Recommendations SNF;Supervision/Assistance - 24 hour    Equipment Recommendations  None recommended by PT    Recommendations for Other Services       Precautions / Restrictions Precautions Precautions: Fall Restrictions Weight Bearing Restrictions: No      Mobility  Bed Mobility Overal bed mobility: Needs Assistance Bed Mobility: Supine to Sit;Sit to Supine     Supine to sit: Min assist Sit to supine: Min assist   General bed mobility comments: unable to use R UE to assist and needed truncal assist to get forward, then needed help to scoot.  Transfers Overall transfer level: Needs assistance Equipment used: 1 person  hand held assist;2 person hand held assist Transfers: Sit to/from Stand Sit to Stand: Mod assist;+2 physical assistance         General transfer comment: needed assist ot come forward and power up.  Pt reluctant to narrow her BOS by w/b on R LE and not moving R LE well  to help stand upright  Ambulation/Gait                Stairs            Wheelchair Mobility    Modified Rankin (Stroke Patients Only) Modified Rankin (Stroke Patients Only) Pre-Morbid Rankin Score: No symptoms Modified Rankin: Moderately severe disability     Balance Overall balance assessment: Needs assistance Sitting-balance support: No upper extremity supported;Single extremity supported Sitting balance-Leahy Scale: Fair Sitting balance - Comments: wanting to list to the right   Standing balance support: Bilateral upper extremity supported Standing balance-Leahy Scale: Poor Standing balance comment: sit to stand x3 with reliance on UE assist.                             Pertinent Vitals/Pain Pain Assessment: No/denies pain    Home Living Family/patient expects to be discharged to:: Private residence Living Arrangements: Children Available Help at Discharge: Family;Friend(s);Available 24 hours/day Type of Home: House Home Access: Stairs to enter Entrance Stairs-Rails: Psychiatric nurse of Steps: 1 Home Layout: One level Home Equipment: Walker - 2 wheels;Bedside commode;Walker - 4 wheels  Prior Function Level of Independence: Needs assistance   Gait / Transfers Assistance Needed: ambulates with 4 [employer]  ADL's / Homemaking Assistance Needed: sponge bathes and dresses herself, toilets using 3 in 1, daughter does all housekeeping and cooking, pt can warm meals  Comments: rollator walker for ambulation     Hand Dominance   Dominant Hand: Right    Extremity/Trunk Assessment   Upper Extremity Assessment: Defer to OT evaluation           Lower  Extremity Assessment: RLE deficits/detail;LLE deficits/detail RLE Deficits / Details: R side notably weaker than L LE.  Quads 3+/5, hams 3+/5, hip flexors 3/5, df/pf 2+/5 LLE Deficits / Details: generally weak     Communication   Communication: Other (comment) (dysarthric, but intelligible)  Cognition Arousal/Alertness: Awake/alert Behavior During Therapy: Flat affect Overall Cognitive Status: Within Functional Limits for tasks assessed                      General Comments      Exercises        Assessment/Plan    PT Assessment Patient needs continued PT services  PT Diagnosis Difficulty walking;Generalized weakness;Other (comment) (hemiparesis)   PT Problem List Decreased strength;Decreased balance;Decreased activity tolerance;Decreased mobility;Decreased coordination;Decreased knowledge of use of DME  PT Treatment Interventions DME instruction;Gait training;Functional mobility training;Therapeutic activities;Balance training;Patient/family education;Neuromuscular re-education   PT Goals (Current goals can be found in the Care Plan section) Acute Rehab PT Goals Patient Stated Goal: I need to move better. PT Goal Formulation: With patient/family Time For Goal Achievement: 08/25/15 Potential to Achieve Goals: Good    Frequency Min 4X/week   Barriers to discharge        Co-evaluation               End of Session   Activity Tolerance: Patient tolerated treatment well Patient left: in bed;with call bell/phone within reach;with bed alarm set Nurse Communication: Mobility status         Time: 4859-2763 PT Time Calculation (min) (ACUTE ONLY): 33 min   Charges:   PT Evaluation $Initial PT Evaluation Tier I: 1 Procedure PT Treatments $Therapeutic Activity: 8-22 mins   PT G Codes:        Ganesh Deeg, Tessie Fass 08/11/2015, 5:05 PM  08/11/2015  Donnella Sham, PT [phone]  (pager)

## 2015-08-11 NOTE — Progress Notes (Signed)
PULMONARY / CRITICAL CARE MEDICINE   Name: Molly [name] MRN: [id number] DOB: 1923/10/01    ADMISSION DATE:  08/09/2015 CONSULTATION DATE:  08/10/2015  REFERRING MD :  Family Medicine Service  CHIEF COMPLAINT: Hypotension   INITIAL PRESENTATION: 81 year old female with recent admission and discharge for acute infarction of left basal ganglia on 9/16 status post TPA with complete resolution of right-sided hemiparesis. Patient discharged on aspirin & Plavix. Patient reportedly awoke after going to bed around 11 PM with right-sided weakness & right facial droop.  STUDIES:  Portable CXR 9/28 - reviewed by me. Low lung volumes. No obvious opacity or pleural effusion. Mediastinum and heart normal in contour.   CT HEAD W/O 9/28 - no intracranial hemorrhage. Evolution of left basal ganglia infarct. No mass effect or midline shift.  SIGNIFICANT EVENTS: 9/28 - Admitted to hospital 9/28 - Transferred to ICU w/ transient hypotension & worsening neuro status   HISTORY OF PRESENT ILLNESS:  81 year old Caucasian female with recent admission for left basal ganglia infarct status post TPA. Patient readmitted for worsening neuro status. She awoke out of sleep with her acute changes. Denies any headache. Denies any chest pain or pressure. Denies any dyspnea or cough. Patient had transient hypotension with worsening of her neuro status earlier this morning. This resolved after a 500 mL fluid bolus. Denies any subjective fever, chills, or sweats. Patient had no sedating medications or antihypertensives before her decompensation. Transferred to the intensive care unit for closer monitoring & workup.  SUBJECTIVE: Very anxious this AM, hypertensive.  VITAL SIGNS: Temp:  [98 F (36.7 C)-98.6 F (37 C)] 98.5 F (36.9 C) (09/29 0735) Pulse Rate:  [57-72] 72 (09/29 1000) Resp:  [14-23] 14 (09/29 1000) BP: (171-196)/(49-145) 192/71 mmHg (09/29 1000) SpO2:  [98 %-100 %] 100 % (09/29 1000) HEMODYNAMICS:    VENTILATOR SETTINGS:   INTAKE / OUTPUT:  Intake/Output Summary (Last 24 hours) at 08/11/15 1153 Last data filed at 08/11/15 0000  Gross per 24 hour  Intake    120 ml  Output      0 ml  Net    120 ml   PHYSICAL EXAMINATION: General:  Awake. Alert. No acute distress.  Integument:  Warm & dry. No rash on exposed skin. Bruising left antecubital fossa. Lymphatics:  No appreciated cervical or supraclavicular lymphadenoapthy. HEENT:  Moist mucus membranes. No oral ulcers. No scleral injection or icterus. PERRL.  "droop" on right that resolves when patient is distracted. Cardiovascular:  Regular rate. No edema. No appreciable JVD.  Pulmonary:  Good aeration & clear to auscultation bilaterally. Symmetric chest wall rise. No accessory muscle use on room air. Abdomen: Soft. Normal bowel sounds. Nondistended. Grossly nontender. Musculoskeletal:  Normal bulk and tone. Hand grip strength, biceps, dorsiflexion, & plantarflexion 5/5 bilaterally. Right & left leg rise 4-/5. No joint deformity or effusion appreciated. Neurological:  Right-sided facial droop. Oriented to person, place, & time. Psychiatric:  Mood and affect congruent. Speech normal rhythm, rate & tone.   LABS:  CBC  Recent Labs Lab 08/10/15 0022 08/10/15 0030 08/11/15 0230  WBC  --  26.7* 26.5*  HGB 15.0 13.5 12.0  HCT 44.0 41.4 37.6  PLT  --  278 282   Coag's  Recent Labs Lab 08/10/15 0030  APTT 31  INR 1.07   BMET  Recent Labs Lab 08/10/15 0022 08/10/15 0030 08/11/15 0230  NA 135 134* 135  K 3.9 3.8 3.4*  CL 99* 101 102  CO2  --  22 23  BUN  20 15 12   CREATININE 0.80 0.89 0.84  GLUCOSE 178* 183* 173*   Electrolytes  Recent Labs Lab 08/10/15 0030 08/11/15 0230  CALCIUM 9.7 8.9   Sepsis Markers  Recent Labs Lab 08/10/15 0819  PROCALCITON <0.10   ABG No results for input(s): PHART, PCO2ART, PO2ART in the last 168 hours. Liver Enzymes  Recent Labs Lab 08/10/15 0030  AST 24  ALT 22   ALKPHOS 80  BILITOT 0.6  ALBUMIN 3.4*   Cardiac Enzymes  Recent Labs Lab 08/10/15 0819 08/10/15 1524 08/10/15 2218  TROPONINI <0.03 <0.03 <0.03   Glucose  Recent Labs Lab 08/10/15 0356 08/10/15 0806 08/10/15 1136 08/10/15 1510 08/10/15 2118 08/11/15 0739  GLUCAP 175* 151* 149* 178* 171* 197*    Imaging Mr Jodene Nam Head Wo Contrast  08/10/2015   CLINICAL DATA:  81 year old female with recent lacunar infarct in the left hemisphere earlier this month status post tPA. Recurrent right side weakness and facial droop. Initial encounter.  EXAM: MRI HEAD WITHOUT CONTRAST  MRA HEAD WITHOUT CONTRAST  TECHNIQUE: Multiplanar, multiecho pulse sequences of the brain and surrounding structures were obtained without intravenous contrast. Angiographic images of the head were obtained using MRA technique without contrast.  COMPARISON:  Head CT at 0007 hours today. Brain MRI and head and neck MRA 07/30/2015  FINDINGS: MRI HEAD FINDINGS  Linear restricted diffusion extending from the posterior left corona radiata inferiorly toward the putamen and external capsule has faded since 07/30/2015. There is stable curvilinear restricted diffusion in the more anterior corona radiata (series 6, image 18). No new areas of diffusion restriction. Major intracranial vascular flow voids are stable.  No interval hemorrhage. No intracranial mass effect. No new intracranial signal abnormality. No mass effect, evidence of mass lesion, ventriculomegaly, or extra-axial collection. Cervicomedullary junction and pituitary are within normal limits.  Stable paranasal sinuses, mastoids, visualized internal auditory structures, orbits soft tissues, and scalp soft tissues. Negative visualized cervical spine. Bone marrow signal remains normal.  MRA HEAD FINDINGS  Stable posterior circulation, high-grade stenosis of the left vertebral artery V4 segment and asymmetrically decreased flow signal at the left PICA origin. Up to moderate proximal  basilar artery stenosis. The basilar artery remains patent. SCA and PCAs are stable, mild to moderate left P1 segment stenosis. Posterior communicating arteries are diminutive or absent.  Stable antegrade flow signal in the distal cervical ICAs. Stable ICA siphons with high-grade bilateral supraclinoid segment irregularity. Both ophthalmic artery origins remain patent. Stable carotid termini. MCA and ACA origins remain patent.  Stable visualized bilateral ACA branches. Stable visualized right MCA branches with mild mid M1 stenosis. Left MCA M1 segment and bifurcation irregularity and up to mild stenosis appears stable. Visualized left MCA branches are stable.  IMPRESSION: 1. Expected evolution of the recent left MCA white matter infarct. No interval hemorrhage. No associated mass effect. 2. No new intracranial abnormality. 3. Stable severe intracranial atherosclerosis on MRA. Moderate to high-grade stenoses of the distal left vertebral artery, both distal ICA siphons, and the proximal basilar artery.   Electronically Signed   By: Genevie Ann M.D.   On: 08/10/2015 14:21   Mr Brain Wo Contrast  08/10/2015   CLINICAL DATA:  81 year old female with recent lacunar infarct in the left hemisphere earlier this month status post tPA. Recurrent right side weakness and facial droop. Initial encounter.  EXAM: MRI HEAD WITHOUT CONTRAST  MRA HEAD WITHOUT CONTRAST  TECHNIQUE: Multiplanar, multiecho pulse sequences of the brain and surrounding structures were obtained without intravenous contrast. Angiographic  images of the head were obtained using MRA technique without contrast.  COMPARISON:  Head CT at 0007 hours today. Brain MRI and head and neck MRA 07/30/2015  FINDINGS: MRI HEAD FINDINGS  Linear restricted diffusion extending from the posterior left corona radiata inferiorly toward the putamen and external capsule has faded since 07/30/2015. There is stable curvilinear restricted diffusion in the more anterior corona radiata  (series 6, image 18). No new areas of diffusion restriction. Major intracranial vascular flow voids are stable.  No interval hemorrhage. No intracranial mass effect. No new intracranial signal abnormality. No mass effect, evidence of mass lesion, ventriculomegaly, or extra-axial collection. Cervicomedullary junction and pituitary are within normal limits.  Stable paranasal sinuses, mastoids, visualized internal auditory structures, orbits soft tissues, and scalp soft tissues. Negative visualized cervical spine. Bone marrow signal remains normal.  MRA HEAD FINDINGS  Stable posterior circulation, high-grade stenosis of the left vertebral artery V4 segment and asymmetrically decreased flow signal at the left PICA origin. Up to moderate proximal basilar artery stenosis. The basilar artery remains patent. SCA and PCAs are stable, mild to moderate left P1 segment stenosis. Posterior communicating arteries are diminutive or absent.  Stable antegrade flow signal in the distal cervical ICAs. Stable ICA siphons with high-grade bilateral supraclinoid segment irregularity. Both ophthalmic artery origins remain patent. Stable carotid termini. MCA and ACA origins remain patent.  Stable visualized bilateral ACA branches. Stable visualized right MCA branches with mild mid M1 stenosis. Left MCA M1 segment and bifurcation irregularity and up to mild stenosis appears stable. Visualized left MCA branches are stable.  IMPRESSION: 1. Expected evolution of the recent left MCA white matter infarct. No interval hemorrhage. No associated mass effect. 2. No new intracranial abnormality. 3. Stable severe intracranial atherosclerosis on MRA. Moderate to high-grade stenoses of the distal left vertebral artery, both distal ICA siphons, and the proximal basilar artery.   Electronically Signed   By: Genevie Ann M.D.   On: 08/10/2015 14:21   I reviewed brain MRI myself, evolution of left MCA white matter infarct without hemorrhage.  ASSESSMENT /  PLAN:  PULMONARY A: No acute issues.  P:   Monitor continuous pulse ox.  Titrate O2 for sat of 88-92%. Ambulate as able.  CARDIOVASCULAR A:  Shock - resolved.  H/O HTN  P:  Monitor vitals.  Trending troponin I. Echo with EF of 65-70%.  RENAL A:   No acute issues.  P:   Monitor UOP. Daily BMET. Replace electrolytes as indicated.  GASTROINTESTINAL A:   No acute issues.  P:   Diet as ordered.  HEMATOLOGIC A:   Leukocytosis - Likely secondary to CLL H/O CLL  P:  Monitor leukocytosis on CBC daily SCDs   INFECTIOUS A:  No acute process.  P:   Procalcitonin algorithm noted <0.10. Monitor off abx.  ENDOCRINE A:   H/O DM H/O Hypothryoidism    P:   Sliding scale insulin algorithm Accuchecks q4hr  NEUROLOGIC A:   Worsening dysarthria & weakness Recent Left Basal Ganglia CVA - s/p TPA on prior admission  P:   ASA Plavix MRI as above. Further recs per neurology.  Discussed with bedside RN.  PCCM will sign off, please call back if needed.  Rush Farmer, M.D. [facility] Pulmonary/Critical Care Medicine. Pager: [phone]. After hours pager: [phone].

## 2015-08-12 ENCOUNTER — Other Ambulatory Visit: Payer: Self-pay | Admitting: Pharmacist

## 2015-08-12 ENCOUNTER — Other Ambulatory Visit: Payer: Medicare Other

## 2015-08-12 ENCOUNTER — Inpatient Hospital Stay (HOSPITAL_COMMUNITY): Payer: Medicare Other

## 2015-08-12 ENCOUNTER — Ambulatory Visit: Payer: Medicare Other | Admitting: Nurse Practitioner

## 2015-08-12 ENCOUNTER — Encounter: Payer: Self-pay | Admitting: Licensed Clinical Social Worker

## 2015-08-12 DIAGNOSIS — R030 Elevated blood-pressure reading, without diagnosis of hypertension: Secondary | ICD-10-CM

## 2015-08-12 DIAGNOSIS — I63239 Cerebral infarction due to unspecified occlusion or stenosis of unspecified carotid arteries: Secondary | ICD-10-CM

## 2015-08-12 LAB — GLUCOSE, CAPILLARY
GLUCOSE-CAPILLARY: 205 mg/dL — AB (ref 65–99)
GLUCOSE-CAPILLARY: 175 mg/dL — AB (ref 65–99)
GLUCOSE-CAPILLARY: 169 mg/dL — AB (ref 65–99)

## 2015-08-12 MED ORDER — ATENOLOL 25 MG PO TABS
25.0000 mg | ORAL_TABLET | Freq: Two times a day (BID) | ORAL | Status: DC
  Administered 2015-08-12 – 2015-08-16 (×9): 25 mg via ORAL
  Filled 2015-08-12 (×9): qty 1

## 2015-08-12 MED ORDER — ONDANSETRON HCL 4 MG/2ML IJ SOLN
4.0000 mg | Freq: Once | INTRAMUSCULAR | Status: AC
  Administered 2015-08-12: 4 mg via INTRAVENOUS
  Filled 2015-08-12: qty 2

## 2015-08-12 NOTE — Clinical Social Work Note (Signed)
Clinical Social Work Assessment  Patient Details  Name: Molly [name] MRN: [id number] Date of Birth: 1923/08/04  Date of referral:  08/12/15               Reason for consult:  Facility Placement                Permission sought to share information with:  Family Supports Permission granted to share information::     Name::     Goodlow,Gloria  Relationship::  daughter   Housing/Transportation Living arrangements for the past 2 months:  Single Family Home Source of Information:  Adult Children Patient Interpreter Needed:  None Criminal Activity/Legal Involvement Pertinent to Current Situation/Hospitalization:  No - Comment as needed Significant Relationships:  Adult Children Lives with:  Self Do you feel safe going back to the place where you live?  No Need for family participation in patient care:  Yes (Comment)  Care giving concerns:  N/A   Facilities manager / plan: CSW met with the pt at the bedside. CSW given permission to speak with [name] (daughter). CSW introduced self and purpose of the visit. CSW discussed SNF rehab. CSW explained the SNF process. CSW explained insurance and its relation to SNF placement. [name] decline the SNF list. [name] reported that she would like the pt to go to CIR. [name] reported that the pt does not want to go to a SNF. CSW answered all questions in which the [place] inquired about. CSW will continue to follow this pt and assist with discharge as needed.   Employment status:  Retired Nurse, adult PT Recommendations:  [place] / Referral to community resources:  [place]  Patient/Family's Response to care:  [name] expressed appreciation for the great care the pt has received.   Patient/Family's Understanding of and Emotional Response to Diagnosis, Current Treatment, and Prognosis:  [name] had great knowledged of the pt's diagnosis and treatment. [name] expressed that  she is determine for the pt to go to CIR because she knows the pt will received great care.   Emotional Assessment Appearance:  Appears stated age Attitude/Demeanor/Rapport:   (Calm ) Affect (typically observed):  Appropriate Orientation:  Oriented to Self, Oriented to  Time, Oriented to Place Alcohol / Substance use:  Not Applicable Psych involvement (Current and /or in the community):  No (Comment)  Discharge Needs  Concerns to be addressed:  Denies Needs/Concerns at this time Readmission within the last 30 days:  No Current discharge risk:  None Barriers to Discharge:  No Barriers Identified   Bibbs, Dysheka, LCSW 08/12/2015, 2:21 PM

## 2015-08-12 NOTE — Progress Notes (Signed)
Speech Language Pathology Treatment: Dysphagia;Cognitive-Linquistic  Patient Details Name: Molly [name] MRN: [id number] DOB: [birth year] Today's Date: 08/12/2015 Time: 1410-1440 SLP Time Calculation (min) (ACUTE ONLY): 30 min  Assessment / Plan / Recommendation Clinical Impression  Skilled observation with minimal cues given with consumption of thin via straw/cup through solids with oral spillage with thin via cup noted, audible swallow with larger amounts of thin via straw/larger sips via cup suggesting possible pharyngeal weakness and impaired mastication with solids noted as well.  Pt/daughter educated re: risk for aspiration without swallowing strategies in place including small sips/bites, slow rate of intake and lingual sweep to right during/after meals.  She also participated in various dysarthria activities including slow rate of speech, increased vocal intensity and exaggerated articulation during functional tasks during session.  OME re-assessment indicated more dysarthric speech than in previous evaluation ranging from 50-74% accuracy during word-conversational tasks.  Pt given a hand-out re: implementing these strategies daily to improve communicative competence. Recommend downgrade to Dysphagia 3/thin with implementation of swallowing strategies.   HPI Other Pertinent Information: 81 year old female with recent admission and discharge for acute infarction of left basal ganglia on 9/16 status post TPA with complete resolution of right-sided hemiparesis. Re-admitted 9/28 wiht right sided weakness and facial droop. CT head: Expected evolution of the left basal ganglia infarct since prior   Pertinent Vitals Pain Assessment: No/denies pain  SLP Plan  Continue with current plan of care    Recommendations Diet recommendations: Dysphagia 3 (mechanical soft);Thin liquid Liquids provided via: Cup;Straw Medication Administration: Whole meds with puree Supervision: Full supervision/cueing  for compensatory strategies Compensations: Slow rate;Small sips/bites;Check for pocketing;Check for anterior loss Postural Changes and/or Swallow Maneuvers: Seated upright 90 degrees              Oral Care Recommendations: Oral care BID Follow up Recommendations: Inpatient Rehab Plan: Continue with current plan of care         ADAMS,PAT, M.S., CCC-SLP 08/12/2015, 2:43 PM

## 2015-08-12 NOTE — Progress Notes (Signed)
Larene Beach in PT dept re-contacted.  Initial contact was this morning at approximately 9:30 a.m.  We are awaiting arrival of SLP to evaluate swallowing of patient.

## 2015-08-12 NOTE — Evaluation (Signed)
Occupational Therapy Evaluation Patient Details Name: Molly [name] MRN: [id number] DOB: June 18, 1923 Today's Date: 08/12/2015    History of Present Illness This 81 y.o. female admitted with Rt sided weakness.  MRI showed expected evolution of recent Lt MCA infarct. Family decided against tPA  Pt with worsening Rt sided weakness 9/30 am.  Repeat MRI showed 1.5 cm recurrent acute infarct centered in the Lt corona radiata new since previous MRI (spoke with RN who ok'd OT evaluation).  PMH includes:  recent  Lt basal ganglia infarct s/p tPA 07/29/15; anemia, HTN, DM, metastatic breast CA, s/p Rt hemiarthroplasty  2011,    Clinical Impression   Pt admitted with above. She demonstrates the below listed deficits and will benefit from continued OT to maximize safety and independence with BADLs.  Pt presents with Rt hemiplegia and nystagmus.  She requires max - total A for ADLs.  Unable to safely attempt transfers with +1 assist.   She will require SNF level rehab at discharge.  Will follow acutely.       Follow Up Recommendations  SNF    Equipment Recommendations  None recommended by OT    Recommendations for Other Services       Precautions / Restrictions Precautions Precautions: Fall Restrictions Weight Bearing Restrictions: No      Mobility Bed Mobility Overal bed mobility: Needs Assistance Bed Mobility: Supine to Sit;Sit to Supine     Supine to sit: Mod assist Sit to supine: Mod assist   General bed mobility comments: Pt requires assist to lift trunk from bed and to lift LEs onto bed.  Mod A to scoot to [place] transfer comment: unable to attempt safely with +1 assist     Balance Overall balance assessment: Needs assistance Sitting-balance support: Feet supported Sitting balance-Leahy Scale: Poor Sitting balance - Comments: min A - min guard assist        Standing balance comment: unable to safely assess                             ADL Overall ADL's : Needs assistance/impaired Eating/Feeding: NPO (awaiting SLP assessment )   Grooming: Wash/dry hands;Wash/dry face;Brushing hair;Minimal assistance;Bed level   Upper Body Bathing: Maximal assistance;Sitting;Bed level   Lower Body Bathing: Maximal assistance;Bed level   Upper Body Dressing : Maximal assistance;Sitting   Lower Body Dressing: Total assistance;Bed level   Toilet Transfer: Total assistance Toilet Transfer Details (indicate cue type and reason): unable to safely perform Toileting- Clothing Manipulation and Hygiene: Total assistance;Bed level       Functional mobility during ADLs: Total assistance General ADL Comments: Pt's daughter present during eval      Vision Vision Assessment?: Yes Eye Alignment: Within Functional Limits Tracking/Visual Pursuits: Decreased smoothness of vertical tracking;Decreased smoothness of horizontal tracking Additional Comments: Ptosis noted Rt eye.  Pt with horizontal nystagmus noted both eyes .  Hse reports blurry vision, but was able to read therapist's nametag without glasses    Perception Perception Perception Tested?: Yes   Praxis Praxis Praxis tested?: Within functional limits    Pertinent Vitals/Pain Pain Assessment: No/denies pain     Hand Dominance Right   Extremity/Trunk Assessment Upper Extremity Assessment Upper Extremity Assessment: RUE deficits/detail RUE Deficits / Details: Pt with ~15-20* AROM elbow flex/ext, trace finger flexion, trace shoulder flexion.  Pt requires max encouragement for  to put forth good effort, as she states she "can't move her arm" RUE Sensation: decreased light touch RUE Coordination: decreased fine motor;decreased gross motor   Lower Extremity Assessment Lower Extremity Assessment: Defer to PT evaluation   Cervical / Trunk Assessment Cervical / Trunk Assessment: Other exceptions (Decreased control Rt side.  Pt with passive lateral flexion )    Communication Communication Communication: Expressive difficulties (dysarthria )   Cognition Arousal/Alertness: Awake/alert Behavior During Therapy: Flat affect Overall Cognitive Status: Within Functional Limits for tasks assessed                     General Comments       Exercises       Shoulder Instructions      Home Living Family/patient expects to be discharged to:: Skilled nursing facility Living Arrangements: Children Available Help at Discharge: Family;Friend(s);Available 24 hours/day Type of Home: House Home Access: Stairs to enter [employer] of Steps: 1 Entrance Stairs-Rails: Right;Left Home Layout: One level     Bathroom Shower/Tub: Corporate investment banker: Standard Bathroom Accessibility: Yes   Home Equipment: Environmental consultant - 2 wheels;Bedside commode;Walker - 4 wheels      Lives With: Daughter    Prior Functioning/Environment Level of Independence: Needs assistance  Gait / Transfers Assistance Needed: ambulates with 4 [employer] ADL's / Homemaking Assistance Needed: sponge bathes and dresses herself, toilets using 3 in 1, daughter does all housekeeping and cooking, pt can warm meals   Comments: rollator walker for ambulation    OT Diagnosis: Generalized weakness;Disturbance of vision;Hemiplegia dominant side   OT Problem List: Decreased strength;Decreased activity tolerance;Impaired balance (sitting and/or standing);Impaired vision/perception;Decreased coordination;Decreased safety awareness;Decreased knowledge of use of DME or AE;Impaired tone;Obesity;Impaired UE functional use   OT Treatment/Interventions: Self-care/ADL training;Neuromuscular education;DME and/or AE instruction;Therapeutic activities;Splinting;Visual/perceptual remediation/compensation;Patient/family education;Balance training    OT Goals(Current goals can be found in the care plan section) Acute Rehab OT Goals Patient Stated Goal: Did not state  OT Goal  Formulation: With patient/family Time For Goal Achievement: 08/26/15 Potential to Achieve Goals: Good ADL Goals Pt Will Perform Grooming: with min guard assist;sitting Pt Will Perform Upper Body Bathing: with min assist;sitting Pt Will Transfer to Toilet: with mod assist;stand pivot transfer;bedside commode Pt/caregiver will Perform Home Exercise Program: Increased ROM;Right Upper extremity;With Supervision;With written HEP provided  OT Frequency: Min 2X/week   Barriers to D/C: Decreased caregiver support  family unable to provide necessary level of assist        Co-evaluation              End of Session Nurse Communication: Mobility status  Activity Tolerance: Patient limited by fatigue Patient left: in bed;with family/visitor present   Time: 1230-1257 OT Time Calculation (min): 27 min Charges:  OT General Charges $OT Visit: 1 Procedure OT Evaluation $Initial OT Evaluation Tier I: 1 Procedure OT Treatments $Neuromuscular Re-education: 8-22 mins G-Codes:    Hawthorne Day M 08/25/2015, 1:39 PM

## 2015-08-12 NOTE — Patient Outreach (Signed)
[facility]) Care Management  08/12/2015  PRESTON WEILL 1923/10/06 [id number]   Assessment-CSW received referral for assistance with transportation, resources on home safety alert necklace, home sitter services and adult care center information. CSW completed chart review and is aware that patient is currently in hospital.  Plan-CSW will make initial outreach once patient has discharged.  Eula Fried, BSW, MSW, [email or web address] Phone: [phone] Fax: [phone]

## 2015-08-12 NOTE — Patient Outreach (Signed)
[facility]) Care Management  [place]   08/12/2015  Molly [name] [birth year] [id number]  Subjective: Molly [name] is a 81 y.o. female who was referred to [facility] for medication assistance. She is currently in the donut hole and has some issues with transportation and is interested in medication delivery.   Of note, patient is currently admitted to the hospital. Will defer further assessment for after the patient is discharged.    Nicoletta Ba, PharmD, [facility] [phone]

## 2015-08-12 NOTE — Progress Notes (Signed)
SLP paged to get a swallow study; patient failed swallow screening.

## 2015-08-12 NOTE — Progress Notes (Signed)
Dr. Leonel Ramsay notified the patient is feeling nauseated.  Order for zofran obtained.  MD also notified that pt is now requiring oxygen at 2 l/m via nasal cannula as her current room air sats are 92% with a good correlating waveform.

## 2015-08-12 NOTE — Care Management Note (Signed)
Case Management Note  Patient Details  Name: BILLY ROCCO MRN: [id number] Date of Birth: 12/02/1922  Subjective/Objective:    Pt admitted on 08/09/15 s/p CVA with Rt weakness.  PTA, pt resided at home with daughter.  She is active with [facility] for PT/OT and RN.                  Action/Plan: Will follow for discharge planning as pt progresses.    Will need PT/OT consults when able to tolerate therapy.    Expected Discharge Date:                  Expected Discharge Plan:  [place]  In-House Referral:     Discharge planning Services  CM Consult  Post Acute Care Choice:    Choice offered to:     DME Arranged:    DME Agency:     HH Arranged:    [place] Agency:     Status of Service:  In process, will continue to follow  Medicare Important Message Given:    Date Medicare IM Given:    Medicare IM give by:    Date Additional Medicare IM Given:    Additional Medicare Important Message give by:     If discussed at [place] of Stay Meetings, dates discussed:    Additional Comments:    Per Staff - R arm flaccid - CT shows worsening,  May need higher level of care of discharge.  Physicians in room with patient  Luz Lex, [place] [phone] 08-12-15 10:40am   Reinaldo Raddle, RN, BSN  Trauma/Neuro ICU Case Manager [phone]

## 2015-08-12 NOTE — Progress Notes (Signed)
STROKE TEAM PROGRESS NOTE   SUBJECTIVE (INTERVAL HISTORY) Her daughter is at the bedside. Pt was put on BP meds yesterday. Resumed amlodipine/losartan, as well as half dose of atenolol, but did not resume clonidine. Pt had high BP during the day but BP down to 140s at night.   This morning, pt complain of worsening symptoms with more right facial droop and slurry speech as well as worsening right arm and leg weakness. Had CT head stat no acute changes and also MRI brain showed new acute stroke at left CR around same location of previous stroke.   OBJECTIVE Temp:  [97.8 F (36.6 C)-98.8 F (37.1 C)] 98 F (36.7 C) (09/30 1100) Pulse Rate:  [61-77] 69 (09/30 1100) Cardiac Rhythm:  [-] Normal sinus rhythm (09/30 1114) Resp:  [17-27] 19 (09/30 1100) BP: (142-194)/(43-85) 192/64 mmHg (09/30 1100) SpO2:  [91 %-97 %] 95 % (09/30 1100) Weight:  [186 lb 4.6 oz (84.5 kg)] 186 lb 4.6 oz (84.5 kg) (09/29 1529)  CBC:   Recent Labs Lab 08/10/15 0030 08/11/15 0230  WBC 26.7* 26.5*  NEUTROABS 9.3*  --   HGB 13.5 12.0  HCT 41.4 37.6  MCV 92.0 91.0  PLT 278 106    Basic Metabolic Panel:   Recent Labs Lab 08/10/15 0030 08/11/15 0230  NA 134* 135  K 3.8 3.4*  CL 101 102  CO2 22 23  GLUCOSE 183* 173*  BUN 15 12  CREATININE 0.89 0.84  CALCIUM 9.7 8.9    Lipid Panel:     Component Value Date/Time   CHOL 186 07/30/2015 0239   TRIG 182* 07/30/2015 0239   HDL 36* 07/30/2015 0239   CHOLHDL 5.2 07/30/2015 0239   VLDL 36 07/30/2015 0239   LDLCALC 114* 07/30/2015 0239   HgbA1c:  Lab Results  Component Value Date   HGBA1C 7.8* 07/30/2015   Urine Drug Screen:     Component Value Date/Time   LABOPIA NONE DETECTED 08/10/2015 0106   COCAINSCRNUR NONE DETECTED 08/10/2015 0106   LABBENZ NONE DETECTED 08/10/2015 0106   AMPHETMU NONE DETECTED 08/10/2015 0106   THCU NONE DETECTED 08/10/2015 0106   LABBARB NONE DETECTED 08/10/2015 0106      IMAGING I have personally reviewed the  radiological images below and agree with the radiology interpretations.  Ct Head Wo Contrast 08/10/2015    1. Expected evolution of the left basal ganglia infarct since prior exam. No interval hemorrhage, mass effect or midline shift.  2. Background chronic small vessel ischemia without CT findings of acute territorial infarct.    08/12/2015   IMPRESSION: Atrophy with small vessel chronic ischemic changes of deep cerebral white matter.  Subacute nonhemorrhagic infarct at LEFT basal ganglia unchanged.  Extensive atherosclerotic calcification.  No new intracranial abnormalities.    Dg Chest Portable 1 View 08/10/2015    No acute pulmonary process.     Mri and Mra head and neck W Wo Contrast 07/30/2015 IMPRESSION: Acute infarction affecting the left basal ganglia and radiating white matter tracts. No swelling or hemorrhage. Extensive chronic small vessel ischemic changes elsewhere throughout the brain. No carotid bifurcation disease. 30-50% stenoses at both vertebral artery origins. Severe atherosclerotic disease intracranially. Advanced disease in the siphon regions and supraclinoid internal carotid arteries with stenosis of the left supraclinoid internal carotid artery of 80% or greater. Stenosis on the right is 60-70%. Approximately 50% stenoses in the distal vertebral arteries and in the proximal basilar artery. Diffuse atherosclerotic narrowing and irregularity in the smaller intracranial branches.  Mri and Mra Head Wo Contrast 08/10/2015   IMPRESSION: 1. Expected evolution of the recent left MCA white matter infarct. No interval hemorrhage. No associated mass effect. 2. No new intracranial abnormality. 3. Stable severe intracranial atherosclerosis on MRA. Moderate to high-grade stenoses of the distal left vertebral artery, both distal ICA siphons, and the proximal basilar artery.   Mr Brain Wo Contrast  08/12/2015   IMPRESSION: 1. 1.5 cm recurrent acute infarct centered in the posterior left  corona radiata, new from the MRI of 2 days ago. This is in the same location although is mildly larger than that on the 07/30/2015 MRI. 2. Chronic ischemic changes as above.   2D ehco 07/30/15 - - Left ventricle: The cavity size was normal. There was moderate concentric hypertrophy. Systolic function was vigorous. The estimated ejection fraction was in the range of 65% to 70%. Wall motion was normal; there were no regional wall motion abnormalities. Doppler parameters are consistent with abnormal left ventricular relaxation (grade 1 diastolic dysfunction). Doppler parameters are consistent with high ventricular filling pressure. - Aortic valve: Trileaflet; moderately thickened, moderately calcified leaflets especially non coronary cusp. Valve mobility was mildly restricted. There was very mild stenosis. Peak velocity (S): 244 cm/s. Mean gradient (S): 13 mm Hg. Valve area (VTI): 1.42 cm^2. Valve area (Vmax): 1.35 cm^2. Valve area (Vmean): 1.17 cm^2. - Mitral valve: Calcified annulus Impressions: - No cardiac source of emboli was indentified. When compared to prior, mild aortic stenosis is now present.   PHYSICAL EXAM  Temp:  [97.8 F (36.6 C)-98.8 F (37.1 C)] 98 F (36.7 C) (09/30 1100) Pulse Rate:  [61-77] 69 (09/30 1100) Resp:  [17-27] 19 (09/30 1100) BP: (142-194)/(43-85) 192/64 mmHg (09/30 1100) SpO2:  [91 %-97 %] 95 % (09/30 1100) Weight:  [186 lb 4.6 oz (84.5 kg)] 186 lb 4.6 oz (84.5 kg) (09/29 1529)  General - Well nourished, well developed, in no apparent distress.  Ophthalmologic - Fundi not visualized due to eye movement.  Cardiovascular - Regular rhythm with no murmur.  Mental Status -  Level of arousal and orientation to time, place, and person were intact. Language including expression, naming, repetition, comprehension was assessed and found intact. Fund of Knowledge was assessed and was intact.  Cranial Nerves II - XII - II -  Visual field intact OU. III, IV, VI - Extraocular movements intact. V - Facial sensation intact bilaterally. VII - right facial droop, more prominent than yesterday. VIII - Hearing & vestibular intact bilaterally. X - Palate elevates symmetrically. XI - Chin turning & shoulder shrug intact bilaterally. XII - Tongue protrusion intact.  Motor Strength - The patient's strength was 1/5 RUE and 3/5 RLE.  LUE and LLE 5/5/. Bulk was normal and fasciculations were absent.   Motor Tone - Muscle tone was assessed at the neck and appendages and was normal.  Reflexes - The patient's reflexes were 1+ in all extremities and she had no pathological reflexes.  Sensory - Light touch, temperature/pinprick were assessed and were symmetrical.    Coordination - The patient had normal movements in the hands and feet with no ataxia or dysmetria.    Gait and Station - deferred due to high BP.   ASSESSMENT/PLAN Molly [name] is a 81 y.o. female with history of HTN, HLD, DM, right breast cancer with stable mets to bones and soft tissue, and recent left CR stroke treated with TPA on 07/29/15 presenting with acute onset of right-sided weakness and slurred speech. She did not  receive IV t-PA due to patient and family decision not to treat. Symptoms improved.  Recurrent left coronal radiation infarct - due to left terminal ICA high grade stenosis in the setting of labile BP fluctuation.   Resultant  Right facial drop and right mild hemiparesis  MRI new recurrent left CR infarct   MRA head stable high grade stenosis on the bilateral ICA siphone, L>R  MRA neck 07/30/2015 revealed no significant disease  2D Echo 07/30/2015 - EF 65-70%. No cardiac source of emboli identified.  LDL 07/30/2015 - 114  HgbA1c 07/30/2015 - 7.8  VTE prophylaxis - SCDs Diet Carb Modified Fluid consistency:: Thin; Room service appropriate?: Yes  aspirin 81 mg orally every day and clopidogrel 75 mg orally every day prior to  admission, now on aspirin 81 mg orally every day and clopidogrel 75 mg orally every day. Continue dual antiplatelets.   Patient counseled to be compliant with her antithrombotic medications  Ongoing aggressive stroke risk factor management  Therapy recommendations: Pending  Disposition: Pending  B/l intracranial ICA stenosis  MRA showed left > 80% stenosis and right 60% stenosis  In the setting of labile BP, it is likely the cause of recurrent left MCA infarct  Due to recurrent left MCA infarcts, may consider left MCA stenting  However, due to her advanced age and metastasis breast Ca, will seek second opinion from Dr. Leonie Man in am  Pt yet to decided on the endovascular procedure due to high risk   Labile BP control  Restarted BP meds about 36 hours post event  Resumed amlodipine and losartan  Also resumed atenolol at half dose - 25mg  tid yesterday  Did not resume clonidine  However, pt had BP in 140s overnight and this am with worsening symptoms and confirmed recurrent stroke   Changed atenolol to 25mg  bid with elimination of night dose  New BP goal 150-160  History of left MCA territory infarct 2 weeks ago  S/p tPA  Most likely due to left terminal ICA high grade stenosis in the setting of hypotension  No significant residue on discharge   On ASA and plavix and zetia  Discharged with home health  Anxiety   Pt very anxious about her condition, about going home, about her BP, about her medications  on clonazepam bid  Hyperlipidemia  Home meds:  Zetia resumed in hospital  LDL 114, goal < 70  Lipitor intolerance  On Zetia, continue Zetia on discharge.  Diabetes  Home meds - lantus  HgbA1c 7.8, goal < 7.0  Uncontrolled  SSI  Resume lantus once on diet.  Other Stroke Risk Factors  Advanced age  Obesity, Body mass index is 31.96 kg/(m^2).   Hx stroke/TIA  Other Active Problems  Contrast allergy   Right breast cancer with stable bone,  blood and soft tissue metastasis  Leukocytosis - 26.7 - due to metastasis breast cancer  Hospital day # 2  Due to overnight worsening symptoms and MRI confirmed recurrent infarct at the same location, labile BP control is like the cause. Had long discussion with pt and daughter at bedside regarding BP management, treatment options as well as her overall prognosis. Will seek second opinion from Dr. Leonie Man tomorrow. Pt yet to decide on potential endovascular procedure. She was encouraged with relaxation and cope with stress. Decreased atenolol to bid with eliminating night dose for better BP management. Pt is on on clonazepam. PT/OT/speech. She needs ongoing stroke evaluation, BP monitoring and aggressive risk factor modification. The patient is at  risk for recurrent strokes and TIAs and neurological worsening.   Molly Hawking, MD PhD Stroke Neurology 08/12/2015 12:47 PM      To contact Stroke Continuity provider, please refer to [email or web address] After hours, contact General Neurology

## 2015-08-12 NOTE — Care Management Important Message (Signed)
Important Message  Patient Details  Name: Molly [name] MRN: [id number] Date of Birth: 1923/02/04   Medicare Important Message Given:  Yes-second notification given    Nathen May 08/12/2015, 12:37 PM

## 2015-08-12 NOTE — Progress Notes (Signed)
Physical Therapy Treatment Patient Details Name: Molly [name] MRN: [id number] DOB: 06/18/1923 Today's Date: 08/12/2015    History of Present Illness This 81 y.o. female admitted with Rt sided weakness.  MRI showed expected evolution of recent Lt MCA infarct. Family decided against tPA  Pt with worsening Rt sided weakness 9/30 am.  Repeat MRI showed 1.5 cm recurrent acute infarct centered in the Lt corona radiata new since previous MRI (spoke with RN who ok'd OT evaluation).  PMH includes:  recent  Lt basal ganglia infarct s/p tPA 07/29/15; anemia, HTN, DM, metastatic breast CA, s/p Rt hemiarthroplasty  2011,     PT Comments    Pt has declined since last treatment with more R UE/LE weakness and worsened dysarthria.  Emphasis on sitting balance and standing at EOB.  Pt unable to weight bear through R LE without significant assist now.   Follow Up Recommendations  SNF;Supervision/Assistance - 24 hour     Equipment Recommendations  None recommended by PT    Recommendations for Other Services Rehab consult     Precautions / Restrictions Precautions Precautions: Fall Restrictions Weight Bearing Restrictions: No    Mobility  Bed Mobility Overal bed mobility: Needs Assistance Bed Mobility: Sit to Supine;Sidelying to Sit Rolling: Mod assist Sidelying to sit: Total assist   Sit to supine: Mod assist;+2 for physical assistance   General bed mobility comments: Pt requires assist to lift trunk from bed and to lift LEs onto bed.  Mod A to scoot to [facility]   Transfers Overall transfer level: Needs assistance Equipment used: 2 person hand held assist (chair back for assist) Transfers: Sit to/from Stand Sit to Stand: Max assist;+2 physical assistance         General transfer comment: 2 person assist to come forward and bracing of R knee. pt also needed upper  stability  Ambulation/Gait             General Gait Details: not able   Stairs            Wheelchair  Mobility    Modified Rankin (Stroke Patients Only) Modified Rankin (Stroke Patients Only) Pre-Morbid Rankin Score: No symptoms Modified Rankin: Severe disability     Balance Overall balance assessment: Needs assistance Sitting-balance support: Bilateral upper extremity supported;Single extremity supported;Feet supported Sitting balance-Leahy Scale: Poor Sitting balance - Comments: min A - min guard assist  After standing trial, pt able to pull herself from right to left into midline and hold for 20 secs before fatigue   Standing balance support: Bilateral upper extremity supported;Single extremity supported;During functional activity Standing balance-Leahy Scale: Poor Standing balance comment: 2 standing trials at EOB with max 2 persons                    Cognition Arousal/Alertness: Awake/alert Behavior During Therapy: Flat affect Overall Cognitive Status: Within Functional Limits for tasks assessed                      Exercises      General Comments        Pertinent Vitals/Pain Pain Assessment: No/denies pain    Home Living                      Prior Function            PT Goals (current goals can now be found in the care plan section) Acute Rehab PT Goals Patient Stated Goal: Did not state  Time For Goal Achievement: 08/25/15 Potential to Achieve Goals: Good Progress towards PT goals: Not progressing toward goals - comment (Likely extension of her stroke with much weaker R side)    Frequency  Min 4X/week    PT Plan Current plan remains appropriate    Co-evaluation             End of Session Equipment Utilized During Treatment: Gait belt Activity Tolerance: Patient tolerated treatment well Patient left: in bed;with call bell/phone within reach;with bed alarm set     Time: 1610-9604 PT Time Calculation (min) (ACUTE ONLY): 30 min  Charges:  $Therapeutic Activity: 23-37 mins                    G Codes:      Salena Ortlieb,  Tessie Fass 08/12/2015, 5:47 PM 08/12/2015  Donnella Sham, PT [phone]  (pager)

## 2015-08-13 DIAGNOSIS — I63032 Cerebral infarction due to thrombosis of left carotid artery: Secondary | ICD-10-CM

## 2015-08-13 LAB — GLUCOSE, CAPILLARY
GLUCOSE-CAPILLARY: 165 mg/dL — AB (ref 65–99)
Glucose-Capillary: 183 mg/dL — ABNORMAL HIGH (ref 65–99)
Glucose-Capillary: 170 mg/dL — ABNORMAL HIGH (ref 65–99)
Glucose-Capillary: 136 mg/dL — ABNORMAL HIGH (ref 65–99)

## 2015-08-13 NOTE — Progress Notes (Signed)
STROKE TEAM PROGRESS NOTE   SUBJECTIVE (INTERVAL HISTORY) Her daughter is at the bedside. . 08/12/15 morning, pt complain of worsening symptoms with more right facial droop and slurry speech as well as worsening right arm and leg weakness. Had CT head stat no acute changes and also MRI brain showed new acute stroke at left CR around same location of previous stroke. She continues to have dense right hemiplegia and appears quite frustrated with her deficits.  OBJECTIVE Temp:  [97.6 F (36.4 C)-98.3 F (36.8 C)] 98.3 F (36.8 C) (10/01 0700) Pulse Rate:  [67-72] 68 (10/01 0358) Cardiac Rhythm:  [-] Normal sinus rhythm (10/01 0700) Resp:  [15-23] 17 (10/01 0358) BP: (122-184)/(33-82) 158/48 mmHg (10/01 0358) SpO2:  [92 %-98 %] 98 % (10/01 0358)  CBC:   Recent Labs Lab 08/10/15 0030 08/11/15 0230  WBC 26.7* 26.5*  NEUTROABS 9.3*  --   HGB 13.5 12.0  HCT 41.4 37.6  MCV 92.0 91.0  PLT 278 536    Basic Metabolic Panel:   Recent Labs Lab 08/10/15 0030 08/11/15 0230  NA 134* 135  K 3.8 3.4*  CL 101 102  CO2 22 23  GLUCOSE 183* 173*  BUN 15 12  CREATININE 0.89 0.84  CALCIUM 9.7 8.9    Lipid Panel:     Component Value Date/Time   CHOL 186 07/30/2015 0239   TRIG 182* 07/30/2015 0239   HDL 36* 07/30/2015 0239   CHOLHDL 5.2 07/30/2015 0239   VLDL 36 07/30/2015 0239   LDLCALC 114* 07/30/2015 0239   HgbA1c:  Lab Results  Component Value Date   HGBA1C 7.8* 07/30/2015   Urine Drug Screen:     Component Value Date/Time   LABOPIA NONE DETECTED 08/10/2015 0106   COCAINSCRNUR NONE DETECTED 08/10/2015 0106   LABBENZ NONE DETECTED 08/10/2015 0106   AMPHETMU NONE DETECTED 08/10/2015 0106   THCU NONE DETECTED 08/10/2015 0106   LABBARB NONE DETECTED 08/10/2015 0106      IMAGING I have personally reviewed the radiological images below and agree with the radiology interpretations.  Ct Head Wo Contrast 08/10/2015    1. Expected evolution of the left basal ganglia infarct  since prior exam. No interval hemorrhage, mass effect or midline shift.  2. Background chronic small vessel ischemia without CT findings of acute territorial infarct.    08/12/2015   IMPRESSION: Atrophy with small vessel chronic ischemic changes of deep cerebral white matter.  Subacute nonhemorrhagic infarct at LEFT basal ganglia unchanged.  Extensive atherosclerotic calcification.  No new intracranial abnormalities.    Dg Chest Portable 1 View 08/10/2015    No acute pulmonary process.     Mri and Mra head and neck W Wo Contrast 07/30/2015 IMPRESSION: Acute infarction affecting the left basal ganglia and radiating white matter tracts. No swelling or hemorrhage. Extensive chronic small vessel ischemic changes elsewhere throughout the brain. No carotid bifurcation disease. 30-50% stenoses at both vertebral artery origins. Severe atherosclerotic disease intracranially. Advanced disease in the siphon regions and supraclinoid internal carotid arteries with stenosis of the left supraclinoid internal carotid artery of 80% or greater. Stenosis on the right is 60-70%. Approximately 50% stenoses in the distal vertebral arteries and in the proximal basilar artery. Diffuse atherosclerotic narrowing and irregularity in the smaller intracranial branches.  Mri and Mra Head Wo Contrast 08/10/2015   IMPRESSION: 1. Expected evolution of the recent left MCA white matter infarct. No interval hemorrhage. No associated mass effect. 2. No new intracranial abnormality. 3. Stable severe intracranial atherosclerosis on  MRA. Moderate to high-grade stenoses of the distal left vertebral artery, both distal ICA siphons, and the proximal basilar artery.   Mr Brain Wo Contrast  08/12/2015   IMPRESSION: 1. 1.5 cm recurrent acute infarct centered in the posterior left corona radiata, new from the MRI of 2 days ago. This is in the same location although is mildly larger than that on the 07/30/2015 MRI. 2. Chronic ischemic changes as  above.   2D ehco 07/30/15 - - Left ventricle: The cavity size was normal. There was moderate concentric hypertrophy. Systolic function was vigorous. The estimated ejection fraction was in the range of 65% to 70%. Wall motion was normal; there were no regional wall motion abnormalities. Doppler parameters are consistent with abnormal left ventricular relaxation (grade 1 diastolic dysfunction). Doppler parameters are consistent with high ventricular filling pressure. - Aortic valve: Trileaflet; moderately thickened, moderately calcified leaflets especially non coronary cusp. Valve mobility was mildly restricted. There was very mild stenosis. Peak velocity (S): 244 cm/s. Mean gradient (S): 13 mm Hg. Valve area (VTI): 1.42 cm^2. Valve area (Vmax): 1.35 cm^2. Valve area (Vmean): 1.17 cm^2. - Mitral valve: Calcified annulus Impressions: - No cardiac source of emboli was indentified. When compared to prior, mild aortic stenosis is now present.   PHYSICAL EXAM  Temp:  [97.6 F (36.4 C)-98.3 F (36.8 C)] 98.3 F (36.8 C) (10/01 0700) Pulse Rate:  [67-72] 68 (10/01 0358) Resp:  [15-23] 17 (10/01 0358) BP: (122-184)/(33-82) 158/48 mmHg (10/01 0358) SpO2:  [92 %-98 %] 98 % (10/01 0358)  General - Well nourished, well developed, in no apparent distress.  Ophthalmologic - Fundi not visualized due to eye movement.  Cardiovascular - Regular rhythm with no murmur.  Mental Status -  Level of arousal and orientation to time, place, and person were intact. Language including expression, naming, repetition, comprehension was assessed and found intact. Fund of Knowledge was assessed and was intact.  Cranial Nerves II - XII - II - Visual field intact OU. III, IV, VI - Extraocular movements intact. V - Facial sensation intact bilaterally. VII - right facial droop,  . VIII - Hearing & vestibular intact bilaterally. X - Palate elevates symmetrically. XI - Chin  turning & shoulder shrug intact bilaterally. XII - Tongue protrusion intact.  Motor Strength - The patient's strength was 0/5 RUE and 1/5 RLE.  LUE and LLE 5/5/. Bulk was normal and fasciculations were absent.   Motor Tone - Muscle tone was assessed at the neck and appendages and was normal.  Reflexes - The patient's reflexes were 1+ in all extremities and she had no pathological reflexes.  Sensory - Light touch, temperature/pinprick were assessed and were symmetrical.    Coordination - The patient had normal movements in the hands and feet with no ataxia or dysmetria.    Gait and Station - deferred due to high BP.   ASSESSMENT/PLAN Ms. CAROLANNE MERCIER is a 81 y.o. female with history of HTN, HLD, DM, right breast cancer with stable mets to bones and soft tissue, and recent left CR stroke treated with TPA on 07/29/15 presenting with acute onset of right-sided weakness and slurred speech. She did not receive IV t-PA due to patient and family decision not to treat. Symptoms improved.  Recurrent left coronal radiation infarct - due to left terminal ICA high grade stenosis in the setting of labile BP fluctuation.   Resultant  Right facial drop and right mild hemiparesis  MRI new recurrent left CR infarct   MRA  head stable high grade stenosis on the bilateral ICA siphone, L>R  MRA neck 07/30/2015 revealed no significant disease  2D Echo 07/30/2015 - EF 65-70%. No cardiac source of emboli identified.  LDL 07/30/2015 - 114  HgbA1c 07/30/2015 - 7.8  VTE prophylaxis - SCDs DIET DYS 3 Room service appropriate?: Yes; Fluid consistency:: Thin  aspirin 81 mg orally every day and clopidogrel 75 mg orally every day prior to admission, now on aspirin 81 mg orally every day and clopidogrel 75 mg orally every day. Continue dual antiplatelets.   Patient counseled to be compliant with her antithrombotic medications  Ongoing aggressive stroke risk factor management  Therapy recommendations:  Pending  Disposition: Pending  B/l intracranial ICA stenosis  MRA showed left > 80% stenosis and right 60% stenosis  In the setting of labile BP, it is likely the cause of recurrent left MCA infarct  Due to recurrent left MCA infarcts, may consider left MCA stenting-discussed with patient and daughter  08/13/15 but they are reluctant at the present time and would like her to recover in rehabilitation prior to considering revascularization  However, due to her advanced age and metastasis breast Ca,       Labile BP control  Restarted BP meds about 36 hours post event  Resumed amlodipine and losartan  Also resumed atenolol at half dose - 25mg  tid yesterday  Did not resume clonidine  However, pt had BP in 140s overnight and this am with worsening symptoms and confirmed recurrent stroke   Changed atenolol to 25mg  bid with elimination of night dose  New BP goal 150-160  History of left MCA territory infarct 2 weeks ago  S/p tPA  Most likely due to left terminal ICA high grade stenosis in the setting of hypotension  No significant residue on discharge   On ASA and plavix and zetia  Discharged with home health  Anxiety   Pt very anxious about her condition, about going home, about her BP, about her medications  on clonazepam bid  Hyperlipidemia  Home meds:  Zetia resumed in hospital  LDL 114, goal < 70  Lipitor intolerance  On Zetia, continue Zetia on discharge.  Diabetes  Home meds - lantus  HgbA1c 7.8, goal < 7.0  Uncontrolled  SSI  Resume lantus once on diet.  Other Stroke Risk Factors  Advanced age  Obesity, Body mass index is 31.96 kg/(m^2).   Hx stroke/TIA  Other Active Problems  Contrast allergy   Right breast cancer with stable bone, blood and soft tissue metastasis  Leukocytosis - 26.7 - due to metastasis breast cancer  Hospital day # 3  Due to  worsening symptoms 08/12/15 and MRI confirmed recurrent infarct at the same  location, labile BP control is like the cause. Had long discussion with pt and daughter at bedside regarding BP management, treatment options as well as her overall prognosis.  . I had a long discussion with the patient and daughter with regards to current role of endovascular stenting and angioplasty for secondary stroke prevention and lack of definitive evidence suggesting improved outcomes. The patient and daughter both want her to first recover and go to rehabilitation and may reconsider angioplasty stenting depending upon her recovery later    Plan to consult rehabilitation. PT/OT/speech. She needs ongoing stroke evaluation, BP monitoring and aggressive risk factor modification. The patient is at risk for recurrent strokes and TIAs and neurological worsening.   Antony Contras, M.D. Stroke Neurology 08/13/2015 11:30 AM      To  contact Stroke Continuity provider, please refer to [email or web address] After hours, contact General Neurology

## 2015-08-14 ENCOUNTER — Inpatient Hospital Stay (HOSPITAL_COMMUNITY): Payer: Medicare Other

## 2015-08-14 LAB — GLUCOSE, CAPILLARY
GLUCOSE-CAPILLARY: 163 mg/dL — AB (ref 65–99)
GLUCOSE-CAPILLARY: 162 mg/dL — AB (ref 65–99)
GLUCOSE-CAPILLARY: 128 mg/dL — AB (ref 65–99)
Glucose-Capillary: 178 mg/dL — ABNORMAL HIGH (ref 65–99)
Glucose-Capillary: 178 mg/dL — ABNORMAL HIGH (ref 65–99)

## 2015-08-14 NOTE — Progress Notes (Signed)
STROKE TEAM PROGRESS NOTE   SUBJECTIVE (INTERVAL HISTORY) Her daughter is at the bedside. . No significant changes noted overnight. She continues to have dense right hemiplegia.  OBJECTIVE Temp:  [97.7 F (36.5 C)-98.6 F (37 C)] 97.7 F (36.5 C) (10/02 1100) Pulse Rate:  [65-75] 75 (10/02 0408) Cardiac Rhythm:  [-] Normal sinus rhythm (10/02 0751) Resp:  [18-24] 18 (10/02 0408) BP: (149-173)/(46-70) 173/70 mmHg (10/02 0408) SpO2:  [95 %-98 %] 98 % (10/02 0408)  CBC:   Recent Labs Lab 08/10/15 0030 08/11/15 0230  WBC 26.7* 26.5*  NEUTROABS 9.3*  --   HGB 13.5 12.0  HCT 41.4 37.6  MCV 92.0 91.0  PLT 278 341    Basic Metabolic Panel:   Recent Labs Lab 08/10/15 0030 08/11/15 0230  NA 134* 135  K 3.8 3.4*  CL 101 102  CO2 22 23  GLUCOSE 183* 173*  BUN 15 12  CREATININE 0.89 0.84  CALCIUM 9.7 8.9    Lipid Panel:     Component Value Date/Time   CHOL 186 07/30/2015 0239   TRIG 182* 07/30/2015 0239   HDL 36* 07/30/2015 0239   CHOLHDL 5.2 07/30/2015 0239   VLDL 36 07/30/2015 0239   LDLCALC 114* 07/30/2015 0239   HgbA1c:  Lab Results  Component Value Date   HGBA1C 7.8* 07/30/2015   Urine Drug Screen:     Component Value Date/Time   LABOPIA NONE DETECTED 08/10/2015 0106   COCAINSCRNUR NONE DETECTED 08/10/2015 0106   LABBENZ NONE DETECTED 08/10/2015 0106   AMPHETMU NONE DETECTED 08/10/2015 0106   THCU NONE DETECTED 08/10/2015 0106   LABBARB NONE DETECTED 08/10/2015 0106      IMAGING I have personally reviewed the radiological images below and agree with the radiology interpretations.  Ct Head Wo Contrast 08/10/2015    1. Expected evolution of the left basal ganglia infarct since prior exam. No interval hemorrhage, mass effect or midline shift.  2. Background chronic small vessel ischemia without CT findings of acute territorial infarct.    08/12/2015   IMPRESSION: Atrophy with small vessel chronic ischemic changes of deep cerebral white matter.   Subacute nonhemorrhagic infarct at LEFT basal ganglia unchanged.  Extensive atherosclerotic calcification.  No new intracranial abnormalities.    Dg Chest Portable 1 View 08/10/2015    No acute pulmonary process.     Mri and Mra head and neck W Wo Contrast 07/30/2015 IMPRESSION: Acute infarction affecting the left basal ganglia and radiating white matter tracts. No swelling or hemorrhage. Extensive chronic small vessel ischemic changes elsewhere throughout the brain. No carotid bifurcation disease. 30-50% stenoses at both vertebral artery origins. Severe atherosclerotic disease intracranially. Advanced disease in the siphon regions and supraclinoid internal carotid arteries with stenosis of the left supraclinoid internal carotid artery of 80% or greater. Stenosis on the right is 60-70%. Approximately 50% stenoses in the distal vertebral arteries and in the proximal basilar artery. Diffuse atherosclerotic narrowing and irregularity in the smaller intracranial branches.  Mri and Mra Head Wo Contrast 08/10/2015   IMPRESSION: 1. Expected evolution of the recent left MCA white matter infarct. No interval hemorrhage. No associated mass effect. 2. No new intracranial abnormality. 3. Stable severe intracranial atherosclerosis on MRA. Moderate to high-grade stenoses of the distal left vertebral artery, both distal ICA siphons, and the proximal basilar artery.   Mr Brain Wo Contrast  08/12/2015   IMPRESSION: 1. 1.5 cm recurrent acute infarct centered in the posterior left corona radiata, new from the MRI of 2  days ago. This is in the same location although is mildly larger than that on the 07/30/2015 MRI. 2. Chronic ischemic changes as above.   2D ehco 07/30/15 - - Left ventricle: The cavity size was normal. There was moderate concentric hypertrophy. Systolic function was vigorous. The estimated ejection fraction was in the range of 65% to 70%. Wall motion was normal; there were no regional wall  motion abnormalities. Doppler parameters are consistent with abnormal left ventricular relaxation (grade 1 diastolic dysfunction). Doppler parameters are consistent with high ventricular filling pressure. - Aortic valve: Trileaflet; moderately thickened, moderately calcified leaflets especially non coronary cusp. Valve mobility was mildly restricted. There was very mild stenosis. Peak velocity (S): 244 cm/s. Mean gradient (S): 13 mm Hg. Valve area (VTI): 1.42 cm^2. Valve area (Vmax): 1.35 cm^2. Valve area (Vmean): 1.17 cm^2. - Mitral valve: Calcified annulus Impressions: - No cardiac source of emboli was indentified. When compared to prior, mild aortic stenosis is now present.   PHYSICAL EXAM  Temp:  [97.7 F (36.5 C)-98.6 F (37 C)] 97.7 F (36.5 C) (10/02 1100) Pulse Rate:  [65-75] 75 (10/02 0408) Resp:  [18-24] 18 (10/02 0408) BP: (149-173)/(46-70) 173/70 mmHg (10/02 0408) SpO2:  [95 %-98 %] 98 % (10/02 0408)  General - Well nourished, well developed, in no apparent distress.  Ophthalmologic - Fundi not visualized due to eye movement.  Cardiovascular - Regular rhythm with no murmur.  Mental Status -  Level of arousal and orientation to time, place, and person were intact. Language including expression, naming, repetition, comprehension was assessed and found intact. Fund of Knowledge was assessed and was intact.  Cranial Nerves II - XII - II - Visual field intact OU. III, IV, VI - Extraocular movements intact. V - Facial sensation intact bilaterally. VII - right facial droop,  . VIII - Hearing & vestibular intact bilaterally. X - Palate elevates symmetrically. XI - Chin turning & shoulder shrug intact bilaterally. XII - Tongue protrusion intact.  Motor Strength - The patient's strength was 0/5 RUE and 1/5 RLE.  LUE and LLE 5/5/. Bulk was normal and fasciculations were absent.   Motor Tone - Muscle tone was assessed at the neck and appendages  and was normal.  Reflexes - The patient's reflexes were 1+ in all extremities and she had no pathological reflexes.  Sensory - Light touch, temperature/pinprick were assessed and were symmetrical.    Coordination - The patient had normal movements in the hands and feet with no ataxia or dysmetria.    Gait and Station - deferred due to high BP.   ASSESSMENT/PLAN [name] is a 81 y.o. female with history of HTN, HLD, DM, right breast cancer with stable mets to bones and soft tissue, and recent left CR stroke treated with TPA on 07/29/15 presenting with acute onset of right-sided weakness and slurred speech. She did not receive IV t-PA due to patient and family decision not to treat. Symptoms improved.  Recurrent left coronal radiation infarct - due to left terminal ICA high grade stenosis in the setting of labile BP fluctuation.   Resultant  Right facial drop and right mild hemiparesis  MRI new recurrent left CR infarct   MRA head stable high grade stenosis on the bilateral ICA siphone, L>R  MRA neck 07/30/2015 revealed no significant disease  2D Echo 07/30/2015 - EF 65-70%. No cardiac source of emboli identified.  LDL 07/30/2015 - 114  HgbA1c 07/30/2015 - 7.8  VTE prophylaxis - SCDs DIET DYS 3  Room service appropriate?: Yes; Fluid consistency:: Thin  aspirin 81 mg orally every day and clopidogrel 75 mg orally every day prior to admission, now on aspirin 81 mg orally every day and clopidogrel 75 mg orally every day. Continue dual antiplatelets.   Patient counseled to be compliant with her antithrombotic medications  Ongoing aggressive stroke risk factor management  Therapy recommendations:SNF  Disposition: Pending  B/l intracranial ICA stenosis  MRA showed left > 80% stenosis and right 60% stenosis  In the setting of labile BP, it is likely the cause of recurrent left MCA infarct  Due to recurrent left MCA infarcts, may consider left MCA stenting-discussed  with patient and daughter  08/13/15 but they are reluctant at the present time and would like her to recover in rehabilitation prior to considering revascularization  However, due to her advanced age and metastasis breast Ca,       Labile BP control  Restarted BP meds about 36 hours post event  Resumed amlodipine and losartan  Also resumed atenolol at half dose - 25mg  tid yesterday  Did not resume clonidine  However, pt had BP in 140s overnight and this am with worsening symptoms and confirmed recurrent stroke   Changed atenolol to 25mg  bid with elimination of night dose  New BP goal 150-160  History of left MCA territory infarct 2 weeks ago  S/p tPA  Most likely due to left terminal ICA high grade stenosis in the setting of hypotension  No significant residue on discharge   On ASA and plavix and zetia  Discharged with home health  Anxiety   Pt very anxious about her condition, about going home, about her BP, about her medications  on clonazepam bid  Hyperlipidemia  Home meds:  Zetia resumed in hospital  LDL 114, goal < 70  Lipitor intolerance  On Zetia, continue Zetia on discharge.  Diabetes  Home meds - lantus  HgbA1c 7.8, goal < 7.0  Uncontrolled  SSI  Resume lantus once on diet.  Other Stroke Risk Factors  Advanced age  Obesity, Body mass index is 31.96 kg/(m^2).   Hx stroke/TIA  Other Active Problems  Contrast allergy   Right breast cancer with stable bone, blood and soft tissue metastasis  Leukocytosis - 26.7 - due to metastasis breast cancer  Hospital day # 4  Due to  worsening symptoms 08/12/15 and MRI confirmed recurrent infarct at the same location, labile BP control is like the cause. Had long discussion with pt and daughter at bedside regarding BP management, treatment options as well as her overall prognosis.  . I had a long discussion with the patient and daughter with regards to current role of endovascular stenting and  angioplasty for secondary stroke prevention and lack of definitive evidence suggesting improved outcomes. The patient and daughter both want her to first recover and go to rehabilitation and may reconsider angioplasty stenting depending upon her recovery later    Plan to consult rehabilitation. PT/OT/speech.  Consider transfer to neurology floor bed and hopefully to rehabilitation of the next few days  Antony Contras, M.D. Stroke Neurology 08/14/2015 12:05 PM      To contact Stroke Continuity provider, please refer to [email or web address] After hours, contact General Neurology

## 2015-08-14 NOTE — Progress Notes (Signed)
Ensured patient has a restricted extremity bracelet on right arm- it is present

## 2015-08-14 NOTE — Progress Notes (Signed)
Bladder scan pt at 0600 with 300-350 ml.  Will continue to monitor patient and report to day RN.

## 2015-08-14 NOTE — Progress Notes (Addendum)
Pt TX to [id number]-[id number], called report (post CT Pt c/o blurred vision, paged DR), family present & aware of [place].

## 2015-08-14 NOTE — Progress Notes (Signed)
Patient arrived to [id number] with daughter at bedside, patient hooked up to 2L 02 and tele set up. Bed alarm set, orders reviewed.

## 2015-08-15 ENCOUNTER — Other Ambulatory Visit: Payer: Self-pay

## 2015-08-15 DIAGNOSIS — I4510 Unspecified right bundle-branch block: Secondary | ICD-10-CM

## 2015-08-15 DIAGNOSIS — I6359 Cerebral infarction due to unspecified occlusion or stenosis of other cerebral artery: Secondary | ICD-10-CM

## 2015-08-15 DIAGNOSIS — I638 Other cerebral infarction: Secondary | ICD-10-CM

## 2015-08-15 DIAGNOSIS — E7800 Pure hypercholesterolemia, unspecified: Secondary | ICD-10-CM

## 2015-08-15 DIAGNOSIS — I6522 Occlusion and stenosis of left carotid artery: Secondary | ICD-10-CM

## 2015-08-15 DIAGNOSIS — K625 Hemorrhage of anus and rectum: Secondary | ICD-10-CM

## 2015-08-15 DIAGNOSIS — I639 Cerebral infarction, unspecified: Secondary | ICD-10-CM

## 2015-08-15 LAB — GLUCOSE, CAPILLARY
GLUCOSE-CAPILLARY: 164 mg/dL — AB (ref 65–99)
GLUCOSE-CAPILLARY: 165 mg/dL — AB (ref 65–99)
Glucose-Capillary: 136 mg/dL — ABNORMAL HIGH (ref 65–99)

## 2015-08-15 LAB — CBC
HCT: 38.3 % (ref 36.0–46.0)
HCT: 35.6 % — ABNORMAL LOW (ref 36.0–46.0)
HCT: 35 % — ABNORMAL LOW (ref 36.0–46.0)
HEMOGLOBIN: 11.2 g/dL — AB (ref 12.0–15.0)
Hemoglobin: 12.4 g/dL (ref 12.0–15.0)
Hemoglobin: 11.3 g/dL — ABNORMAL LOW (ref 12.0–15.0)
MCH: 30.2 pg (ref 26.0–34.0)
MCH: 29.4 pg (ref 26.0–34.0)
MCH: 29.7 pg (ref 26.0–34.0)
MCHC: 32.4 g/dL (ref 30.0–36.0)
MCHC: 31.7 g/dL (ref 30.0–36.0)
MCHC: 32 g/dL (ref 30.0–36.0)
MCV: 93.2 fL (ref 78.0–100.0)
MCV: 92.7 fL (ref 78.0–100.0)
MCV: 92.8 fL (ref 78.0–100.0)
PLATELETS: 294 10*3/uL (ref 150–400)
PLATELETS: 295 10*3/uL (ref 150–400)
Platelets: 278 10*3/uL (ref 150–400)
RBC: 4.11 MIL/uL (ref 3.87–5.11)
RBC: 3.84 MIL/uL — AB (ref 3.87–5.11)
RBC: 3.77 MIL/uL — AB (ref 3.87–5.11)
RDW: 13.7 % (ref 11.5–15.5)
RDW: 13.8 % (ref 11.5–15.5)
RDW: 13.6 % (ref 11.5–15.5)
WBC: 20.5 10*3/uL — AB (ref 4.0–10.5)
WBC: 22.9 10*3/uL — AB (ref 4.0–10.5)
WBC: 22.2 10*3/uL — AB (ref 4.0–10.5)

## 2015-08-15 LAB — ABO/RH: ABO/RH(D): A POS

## 2015-08-15 LAB — TYPE AND SCREEN
ABO/RH(D): A POS
ANTIBODY SCREEN: NEGATIVE

## 2015-08-15 LAB — PROTIME-INR
INR: 1.2 (ref 0.00–1.49)
Prothrombin Time: 15.4 seconds — ABNORMAL HIGH (ref 11.6–15.2)

## 2015-08-15 LAB — BASIC METABOLIC PANEL
ANION GAP: 9 (ref 5–15)
BUN: 23 mg/dL — ABNORMAL HIGH (ref 6–20)
CALCIUM: 9.1 mg/dL (ref 8.9–10.3)
CO2: 25 mmol/L (ref 22–32)
Chloride: 100 mmol/L — ABNORMAL LOW (ref 101–111)
Creatinine, Ser: 0.9 mg/dL (ref 0.44–1.00)
GFR, EST NON AFRICAN AMERICAN: 55 mL/min — AB (ref >60)
Glucose, Bld: 200 mg/dL — ABNORMAL HIGH (ref 65–99)
Potassium: 4.6 mmol/L (ref 3.5–5.1)
SODIUM: 134 mmol/L — AB (ref 135–145)

## 2015-08-15 LAB — APTT: aPTT: 39 seconds — ABNORMAL HIGH (ref 24–37)

## 2015-08-15 MED ORDER — SODIUM CHLORIDE 0.9 % IV SOLN
8.0000 mg/h | INTRAVENOUS | Status: DC
  Filled 2015-08-15 (×2): qty 80

## 2015-08-15 MED ORDER — SODIUM CHLORIDE 0.9 % IV BOLUS (SEPSIS)
500.0000 mL | Freq: Once | INTRAVENOUS | Status: AC
  Administered 2015-08-15: 500 mL via INTRAVENOUS

## 2015-08-15 MED ORDER — PANTOPRAZOLE SODIUM 40 MG IV SOLR
40.0000 mg | Freq: Two times a day (BID) | INTRAVENOUS | Status: DC
  Administered 2015-08-15 – 2015-08-18 (×6): 40 mg via INTRAVENOUS
  Filled 2015-08-15 (×7): qty 40

## 2015-08-15 MED ORDER — SODIUM CHLORIDE 0.9 % IV SOLN
INTRAVENOUS | Status: DC
  Administered 2015-08-15 – 2015-08-17 (×3): via INTRAVENOUS

## 2015-08-15 MED ORDER — PANTOPRAZOLE SODIUM 40 MG IV SOLR
80.0000 mg | Freq: Once | INTRAVENOUS | Status: AC
  Administered 2015-08-15: 80 mg via INTRAVENOUS
  Filled 2015-08-15 (×2): qty 80

## 2015-08-15 MED ORDER — LISINOPRIL 10 MG PO TABS
10.0000 mg | ORAL_TABLET | Freq: Every day | ORAL | Status: DC
  Administered 2015-08-15 – 2015-08-16 (×2): 10 mg via ORAL
  Filled 2015-08-15 (×2): qty 1

## 2015-08-15 MED ORDER — PANTOPRAZOLE SODIUM 40 MG IV SOLR: 40.0000 mg | Freq: Two times a day (BID) | INTRAVENOUS | Status: DC

## 2015-08-15 MED ORDER — CLONAZEPAM 0.5 MG PO TABS
0.2500 mg | ORAL_TABLET | Freq: Two times a day (BID) | ORAL | Status: DC
  Administered 2015-08-15 – 2015-08-17 (×4): 0.25 mg via ORAL
  Filled 2015-08-15 (×4): qty 1

## 2015-08-15 MED ORDER — POLYETHYLENE GLYCOL 3350 17 G PO PACK
17.0000 g | PACK | Freq: Three times a day (TID) | ORAL | Status: DC
  Administered 2015-08-15 – 2015-08-18 (×8): 17 g via ORAL
  Filled 2015-08-15 (×9): qty 1

## 2015-08-15 NOTE — Clinical Social Work Placement (Signed)
   CLINICAL SOCIAL WORK PLACEMENT  NOTE  Date:  08/15/2015  Patient Details  Name: Molly [name] MRN: [id number] Date of Birth: 1923/07/18  Clinical Social Work is seeking post-discharge placement for this patient at the [place] level of care (*CSW will initial, date and re-position this form in  chart as items are completed):  Yes   Patient/family provided with [facility] Work Department's list of facilities offering this level of care within the geographic area requested by the patient (or if unable, by the patient's family).  Yes   Patient/family informed of their freedom to choose among providers that offer the needed level of care, that participate in Medicare, Medicaid or managed care program needed by the patient, have an available bed and are willing to accept the patient.  Yes   Patient/family informed of [facility]'s ownership interest in [facility] and [facility], as well as of the fact that they are under no obligation to receive care at these facilities.  PASRR submitted to EDS on 08/14/15     PASRR number received on 08/14/15     Existing PASRR number confirmed on       FL2 transmitted to all facilities in geographic area requested by pt/family on 08/15/15     FL2 transmitted to all facilities within larger geographic area on       Patient informed that his/her managed care company has contracts with or will negotiate with certain facilities, including the following:            Patient/family informed of bed offers received.  Patient chooses bed at       Physician recommends and patient chooses bed at      Patient to be transferred to   on  .  Patient to be transferred to facility by       Patient family notified on   of transfer.  Name of family member notified:        PHYSICIAN       Additional Comment:    _______________________________________________ Liz Beach MSW, [place], [place], [phone]

## 2015-08-15 NOTE — Progress Notes (Signed)
Occupational Therapy Treatment Patient Details Name: Molly [name] MRN: [id number] DOB: 1922-11-25 Today's Date: 08/15/2015    History of present illness This 81 y.o. female admitted with Rt sided weakness.  MRI showed expected evolution of recent Lt MCA infarct. Family decided against tPA  Pt with worsening Rt sided weakness 9/30 am.  Repeat MRI showed 1.5 cm recurrent acute infarct centered in the Lt corona radiata new since previous MRI (spoke with RN who ok'd OT evaluation).  PMH includes:  recent  Lt basal ganglia infarct s/p tPA 07/29/15; anemia, HTN, DM, metastatic breast CA, s/p Rt hemiarthroplasty  2011,    OT comments  OT session limited by pt lethargy. Pt declined bed mobility citing dizziness with HOB elevated and lethargy. Pt's daughter attributes lethargy to her "nerve pill." Bed level therapeutic activities incorporating RUE. Educated pt and daughter on importance/benefits of therapeutic interventions to address right side weakness. OT to continue to follow acutely.   Follow Up Recommendations  CIR;Supervision/Assistance - 24 hour    Equipment Recommendations  None recommended by OT    Recommendations for Other Services      Precautions / Restrictions Precautions Precautions: Fall Precaution Comments: dense R hemi Restrictions Weight Bearing Restrictions: No       Mobility Bed Mobility   General bed mobility comments: pt declined   Transfers            Balance                         ADL       Grooming: Wash/dry face;Brushing hair                                 General ADL Comments: Pt declined coming to EOB due to lethargy and dizziness with HOB elevated. Bed level grooming tasks incorporating RUE. Pt completed grooming tasks above with HOH total assist using RUE. Completed tray table slides with washcloth using RUE HOH total assist. Discussed with daughter the importance of therapeutic interventions to right side.        Vision                     Perception     Praxis      Cognition   Behavior During Therapy: Flat affect Overall Cognitive Status: Within Functional Limits for tasks assessed                       Extremity/Trunk Assessment               Exercises     Shoulder Instructions       General Comments      Pertinent Vitals/ Pain       Pain Assessment: No/denies pain  Home Living                                          Prior Functioning/Environment              Frequency Min 2X/week     Progress Toward Goals  OT Goals(current goals can now be found in the care plan section)     Acute Rehab OT Goals Patient Stated Goal: did not state OT Goal Formulation: With patient/family Time For Goal Achievement: 08/26/15 Potential to Achieve  Goals: Good ADL Goals Pt Will Perform Eating: Independently;sitting Pt Will Perform Grooming: with min guard assist;sitting Pt Will Perform Upper Body Bathing: with min assist;sitting Pt Will Perform Lower Body Bathing: with modified independence;sit to/from stand Pt Will Perform Lower Body Dressing: with modified independence;sit to/from stand Pt Will Transfer to Toilet: with mod assist;stand pivot transfer;bedside commode Pt Will Perform Toileting - Clothing Manipulation and hygiene: with modified independence;sit to/from stand Pt/caregiver will Perform Home Exercise Program: Increased ROM;Right Upper extremity;With Supervision;With written HEP provided  Plan Discharge plan remains appropriate    Co-evaluation                 End of Session     Activity Tolerance Patient limited by lethargy;Other (comment) (dizziness with HOB elevated)   Patient Left in bed;with family/visitor present;with SCD's reapplied   Nurse Communication Other (comment) (lethargy likely due to meds)        Time: 1206-1223 OT Time Calculation (min): 17 min  Charges: OT Treatments $Therapeutic  Activity: 8-22 mins  Hortencia Pilar 08/15/2015, 12:40 PM

## 2015-08-15 NOTE — Consult Note (Signed)
[facility] CON[facility] Reason for consult: G.I. bleeding Referring Physician: Neurology. PCP: Dr. Jill Alexanders is an 81 y.o. female.  HPI: she is known to our practice. She has had a history of AVMs in her upper G.I. tract causing bleeding that have required cauterization 5 or 6 years ago. She has had metastatic breast cancer in CLL. She was hospitalized one year ago and underwent colonoscopy by Dr. Michail Sermon with the finding of several colon polyps were removed, hemorrhoids, and diverticulosis. There were no AVMs noted on that exam throughout the colon. She has had recent strokes over the past several weeks and is required to admissions. She has been on aspirin Plavix. She has begun to pass some maroon stool but no melena. Denies abdominal pain. Labs remarkable for hemoglobin 12.4 which is unchanged and normal BUN and creatinine several days ago with slight bump in BUN but only minimal. We were asked to see her about whether any other test for her G.I. bleeding would be appropriate. Discussion with her daughter and the patient has had lots of problems with constipation and has to take laxatives chronically. It's not really clear when she had another good bowel movement.  Past Medical History  Diagnosis Date  . Hypertension   . MVP (mitral valve prolapse)   . Hypothyroidism   . Anemia   . Hypercholesterolemia   . Heart murmur   . Type II diabetes mellitus   . Arthritis     "back; knees, hands" (05/11/2015)  . Breast cancer, right breast     "cells went into the blood and caused her hip to break" (05/11/2015)    Past Surgical History  Procedure Laterality Date  . Hemiarthroplasty hip Right     "partial replacement"  . Pelvic and para-aortic lymph node dissection    . Tonsillectomy    . Appendectomy    . Cholecystectomy    . US echocardiography  03/30/2009    EF 55-60%  . Cardiovascular stress test  01/28/2008    EF 74%  . Esophagogastroduodenoscopy N/A 07/08/2014     Procedure: ESOPHAGOGASTRODUODENOSCOPY (EGD);  Surgeon: Winfield Cunas., MD;  Location: [facility];  Service: Endoscopy;  Laterality: N/A;  . Colonoscopy N/A 07/10/2014    Procedure: COLONOSCOPY;  Surgeon: Lear Ng, MD;  Location: [facility];  Service: Endoscopy;  Laterality: N/A;  . Total abdominal hysterectomy      w/BSO  . Dilation and curettage of uterus    . Breast biopsy Right 2009  . Breast lumpectomy Right 2009  . Fracture surgery    . Cataract extraction w/ intraocular lens  implant, bilateral Bilateral     Family History  Problem Relation Age of Onset  . Hypertension Mother   . Heart attack Father   . Hypertension Father   . Diabetes Sister   . Hypertension Sister   . Liver cancer Sister   . Heart attack Brother   . Hypertension Brother   . Diabetes Brother   . Diabetes Sister   . Hypertension Sister     Social History:  reports that she has never smoked. She has never used smokeless tobacco. She reports that she does not drink alcohol or use illicit drugs.  Allergies:  Allergies  Allergen Reactions  . Amlodipine Swelling    Currently taking exforge, separate med-Amlodipiine caused lower leg edema  . Contrast Media [Iodinated Diagnostic Agents] Nausea Only and Other (See Comments)    Patient experienced chills, nausea, hypothermic symptoms, also weird  feelings  . Hydralazine Other (See Comments)    Severe hypotension-suddenly and drastically dropped BP  . Metoprolol Other (See Comments)    Severe hypotension-suddenly and drastically dropped BP, same as hydralazine  . Codeine Other (See Comments)    Makes her feel "crazy."  . Demerol Other (See Comments)    Makes her feel "crazy."  . Librium Other (See Comments)    Makes her feel "crazy."  . Lipitor [Atorvastatin Calcium] Other (See Comments)    Makes her feel "crazy."    Medications; Prior to Admission medications   Medication Sig Start Date End Date Taking? Authorizing Provider   acetaminophen (TYLENOL) 500 MG tablet Take 500 mg by mouth every 6 (six) hours as needed (pain).   Yes Historical Provider, MD  amLODipine-valsartan (EXFORGE) 10-320 MG per tablet Take 1 tablet by mouth daily. 03/22/15  Yes Darlin Coco, MD  anastrozole (ARIMIDEX) 1 MG tablet TAKE 1 TABLET BY MOUTH EVERY DAY 02/28/15  Yes Ladell Pier, MD  aspirin EC 81 MG tablet Take 81 mg by mouth daily.   Yes Historical Provider, MD  atenolol (TENORMIN) 50 MG tablet Take 1 tablet (50 mg total) by mouth 3 (three) times daily. 07/06/15  Yes Darlin Coco, MD  Calcium Carbonate-Vitamin D (CALCIUM + D PO) Take 1 tablet by mouth daily after lunch.    Yes Historical Provider, MD  cephALEXin (KEFLEX) 500 MG capsule Take 1 capsule (500 mg total) by mouth every 12 (twelve) hours. 08/02/15  Yes David L Rinehuls, PA-C  cholecalciferol (VITAMIN D) 1000 UNITS tablet Take 1,000 Units by mouth daily after lunch.    Yes Historical Provider, MD  cloNIDine (CATAPRES) 0.1 MG tablet Take 1 tablet (0.1 mg total) by mouth 2 (two) times daily. 08/02/15  Yes David L Rinehuls, PA-C  clopidogrel (PLAVIX) 75 MG tablet Take 1 tablet (75 mg total) by mouth daily. 08/02/15  Yes David L Rinehuls, PA-C  Cyanocobalamin (B-12) 500 MCG TABS Take 500 mcg by mouth daily after lunch.    Yes Historical Provider, MD  docusate sodium (COLACE) 100 MG capsule Take 2 capsules (200 mg total) by mouth 2 (two) times daily as needed for mild constipation. 06/21/15  Yes Thurnell Lose, MD  FERROCITE 324 MG TABS TAKE 1 TABLET (106 MG OF IRON TOTAL) BY MOUTH DAILY. Patient taking differently: TAKE 1 TABLET (106 MG OF IRON TOTAL) BY MOUTH DAILY AFTER LUNCH 02/28/15  Yes Ladell Pier, MD  LANTUS SOLOSTAR 100 UNIT/ML Solostar Pen Inject 18 Units into the skin daily at 12 noon.  07/01/15  Yes Historical Provider, MD  levothyroxine (SYNTHROID, LEVOTHROID) 75 MCG tablet Take 75 mcg by mouth daily before breakfast.    Yes Historical Provider, MD  potassium  chloride (KLOR-CON M10) 10 MEQ tablet TAKE 1 TABLET (10 MEQ TOTAL) BY MOUTH DAILY. Patient taking differently: Take 10 mEq by mouth daily after lunch.  09/07/14  Yes Darlin Coco, MD  spironolactone (ALDACTONE) 25 MG tablet 1/2 tablet by mouth daily Patient taking differently: Take 12.5 mg by mouth daily.  08/09/15  Yes Darlin Coco, MD  ezetimibe (ZETIA) 10 MG tablet Take 1 tablet (10 mg total) by mouth daily. Patient not taking: Reported on 08/08/2015 09/07/14   Darlin Coco, MD   . amLODipine  10 mg Oral Daily   And  . irbesartan  300 mg Oral Daily  . atenolol  25 mg Oral BID  . clonazePAM  0.25 mg Oral BID  . cyanocobalamin  500 mcg Oral  QPC lunch  . docusate sodium  200 mg Oral BID  . ezetimibe  10 mg Oral Daily  . insulin aspart  0-9 Units Subcutaneous TID AC & HS  . levothyroxine  75 mcg Oral QAC breakfast  . pantoprazole (PROTONIX) IV  80 mg Intravenous Once  . pantoprazole (PROTONIX) IV  40 mg Intravenous Q12H   PRN Meds  Results for orders placed or performed during the hospital encounter of 08/09/15 (from the past 48 hour(s))  Glucose, capillary     Status: Abnormal   Collection Time: 08/13/15  9:40 PM  Result Value Ref Range   Glucose-Capillary 136 (H) 65 - 99 mg/dL   Comment 1 Notify RN   Glucose, capillary     Status: Abnormal   Collection Time: 08/14/15  7:48 AM  Result Value Ref Range   Glucose-Capillary 178 (H) 65 - 99 mg/dL  Glucose, capillary     Status: Abnormal   Collection Time: 08/14/15  1:23 PM  Result Value Ref Range   Glucose-Capillary 163 (H) 65 - 99 mg/dL  Glucose, capillary     Status: Abnormal   Collection Time: 08/14/15  2:19 PM  Result Value Ref Range   Glucose-Capillary 162 (H) 65 - 99 mg/dL   Comment 1 Notify RN    Comment 2 Document in Chart   Glucose, capillary     Status: Abnormal   Collection Time: 08/14/15  6:08 PM  Result Value Ref Range   Glucose-Capillary 178 (H) 65 - 99 mg/dL  Glucose, capillary     Status: Abnormal    Collection Time: 08/14/15  9:35 PM  Result Value Ref Range   Glucose-Capillary 128 (H) 65 - 99 mg/dL   Comment 1 Notify RN    Comment 2 Document in Chart   Glucose, capillary     Status: Abnormal   Collection Time: 08/15/15  6:21 AM  Result Value Ref Range   Glucose-Capillary 164 (H) 65 - 99 mg/dL   Comment 1 Notify RN    Comment 2 Document in Chart   CBC     Status: Abnormal   Collection Time: 08/15/15 10:56 AM  Result Value Ref Range   WBC 20.5 (H) 4.0 - 10.5 K/uL   RBC 4.11 3.87 - 5.11 MIL/uL   Hemoglobin 12.4 12.0 - 15.0 g/dL   HCT 38.3 36.0 - 46.0 %   MCV 93.2 78.0 - 100.0 fL   MCH 30.2 26.0 - 34.0 pg   MCHC 32.4 30.0 - 36.0 g/dL   RDW 13.7 11.5 - 15.5 %   Platelets 294 150 - 400 K/uL  Basic metabolic panel     Status: Abnormal   Collection Time: 08/15/15 10:56 AM  Result Value Ref Range   Sodium 134 (L) 135 - 145 mmol/L   Potassium 4.6 3.5 - 5.1 mmol/L   Chloride 100 (L) 101 - 111 mmol/L   CO2 25 22 - 32 mmol/L   Glucose, Bld 200 (H) 65 - 99 mg/dL   BUN 23 (H) 6 - 20 mg/dL   Creatinine, Ser 0.90 0.44 - 1.00 mg/dL   Calcium 9.1 8.9 - 10.3 mg/dL   GFR calc non Af Amer 55 (L) >60 mL/min   GFR calc Af Amer >60 >60 mL/min    Comment: (NOTE) The eGFR has been calculated using the CKD EPI equation. This calculation has not been validated in all clinical situations. eGFR's persistently <60 mL/min signify possible Chronic Kidney Disease.    Anion gap 9 5 - 15  Glucose, capillary     Status: Abnormal   Collection Time: 08/15/15 11:50 AM  Result Value Ref Range   Glucose-Capillary 165 (H) 65 - 99 mg/dL   Comment 1 Notify RN    Comment 2 Document in Chart     Ct Head Wo Contrast  08/14/2015   CLINICAL DATA:  Follow up stroke.  RIGHT-sided weakness.  EXAM: CT HEAD WITHOUT CONTRAST  TECHNIQUE: Contiguous axial images were obtained from the base of the skull through the vertex without intravenous contrast.  COMPARISON:  Most recent MR 08/12/2015. Most recent CT head  08/12/2015.  FINDINGS: Increasingly well defined hypoattenuation in the LEFT centrum semiovale representing the area of restricted diffusion on the recent MR. No definite new areas of acute infarction. No hemorrhagic transformation. Baseline atrophy with small vessel disease. Calvarium intact. No sinus or acute mastoid disease. Vascular calcification.  IMPRESSION: Increasingly well-defined hypoattenuation representing acute infarction LEFT MCA territory. No hemorrhagic transformation.   Electronically Signed   By: Staci Righter M.D.   On: 08/14/2015 17:14               Blood pressure 188/59, pulse 65, temperature 97.8 F (36.6 C), temperature source Oral, resp. rate 20, height _0  (1.626 m), weight 84.5 kg (186 lb 4.6 oz), SpO2 98 %.  Physical exam:   General--pleasant white female who is responsive but has obvious impairment primarily on the right side  ENT--nonicteric  Neck-- without lymphadenopathy  Heart--regular rate and rhythm without murmurs or gallops  Lungs--clear  Abdomen--soft completely nontender  Psych--oriented and seems appropriate has slurred speech   Assessment: 1. Subacute G.I. bleeding. This is in the face of aspirin Plavix. Colonoscopy one year ago showed some polyps and diverticular disease. This does appear to be more likely lower G.I. than upper G.I. She does have an history of AVM in her upper G.I. track requiring cauterization about 6 years ago. 2. Cerebral vascular disease. Patient has had recurrent left-sided CVA 3. Breast cancer. Metastatic to her  hip patient has been followed by Dr Benay Spice 4. CLL  Plan: long discussion with daughter. I don't feel this patient could tolerate colonoscopy prep this time. Her hemoglobin stable. Her symptoms are more suggestive of lower G.I. source but cannot rule out AVM somewhere in the G.I. tract. We will go ahead and give her Miralax in order to try to clean outer G.I. track and see how she does.   Olivia Royse JR,Jaedynn Bohlken  L 08/15/2015, 3:47 PM   Pager: [phone] If no answer or after hours call [phone]

## 2015-08-15 NOTE — Progress Notes (Signed)
Pt unable to void.  Bladder scan showed 747 ml.  MD notified and ordered in and out cath.  Pt was able to void a small amount and bladder scan showed 532 ml.  In & out cath removed 700 ml.  Will continue to monitor.   Fredrich Romans, RN

## 2015-08-15 NOTE — Clinical Social Work Note (Signed)
CSW was notified by rehab admissions coordinator that the patient's daughter would be agreeable to SNF placement with [facility] if facility can accept.   Liz Beach MSW, [place], [place], [phone]

## 2015-08-15 NOTE — Progress Notes (Signed)
STROKE TEAM PROGRESS NOTE   SUBJECTIVE (INTERVAL HISTORY) DAUGHTER at bedside. This morning when pt up with PT, she became weak and dizzy. They assisted her back to bed. She had reported "purple clots" coming from her rectum, mucousy in description. RN reported follow up pt with large hemorrhoids and BRB. Daughter concerned about dehydration, overall condition, presence of visitors when her mother needs to rest.   OBJECTIVE Temp:  [97.7 F (36.5 C)-98.8 F (37.1 C)] 97.7 F (36.5 C) (10/03 0927) Pulse Rate:  [59-73] 69 (10/03 0927) Cardiac Rhythm:  [-] Normal sinus rhythm (10/03 0805) Resp:  [19-26] 19 (10/03 0927) BP: (158-180)/(53-78) 172/60 mmHg (10/03 0927) SpO2:  [89 %-100 %] 100 % (10/03 0927)  CBC:   Recent Labs Lab 08/10/15 0030 08/11/15 0230  WBC 26.7* 26.5*  NEUTROABS 9.3*  --   HGB 13.5 12.0  HCT 41.4 37.6  MCV 92.0 91.0  PLT 278 774    Basic Metabolic Panel:   Recent Labs Lab 08/10/15 0030 08/11/15 0230  NA 134* 135  K 3.8 3.4*  CL 101 102  CO2 22 23  GLUCOSE 183* 173*  BUN 15 12  CREATININE 0.89 0.84  CALCIUM 9.7 8.9    Lipid Panel:     Component Value Date/Time   CHOL 186 07/30/2015 0239   TRIG 182* 07/30/2015 0239   HDL 36* 07/30/2015 0239   CHOLHDL 5.2 07/30/2015 0239   VLDL 36 07/30/2015 0239   LDLCALC 114* 07/30/2015 0239   HgbA1c:  Lab Results  Component Value Date   HGBA1C 7.8* 07/30/2015   Urine Drug Screen:     Component Value Date/Time   LABOPIA NONE DETECTED 08/10/2015 0106   COCAINSCRNUR NONE DETECTED 08/10/2015 0106   LABBENZ NONE DETECTED 08/10/2015 0106   AMPHETMU NONE DETECTED 08/10/2015 0106   THCU NONE DETECTED 08/10/2015 0106   LABBARB NONE DETECTED 08/10/2015 0106     IMAGING  Ct Head Wo Contrast 08/14/2015 Increasingly well-defined hypoattenuation representing acute infarction LEFT MCA territory. No hemorrhagic transformation. 08/12/2015   IMPRESSION: Atrophy with small vessel chronic ischemic changes of deep  cerebral white matter.  Subacute nonhemorrhagic infarct at LEFT basal ganglia unchanged.  Extensive atherosclerotic calcification.  No new intracranial abnormalities.   08/10/2015    1. Expected evolution of the left basal ganglia infarct since prior exam. No interval hemorrhage, mass effect or midline shift.  2. Background chronic small vessel ischemia without CT findings of acute territorial infarct.    Dg Chest Portable 1 View 08/10/2015    No acute pulmonary process.     Mri and Mra head and neck W Wo Contrast 07/30/2015 IMPRESSION: Acute infarction affecting the left basal ganglia and radiating white matter tracts. No swelling or hemorrhage. Extensive chronic small vessel ischemic changes elsewhere throughout the brain. No carotid bifurcation disease. 30-50% stenoses at both vertebral artery origins. Severe atherosclerotic disease intracranially. Advanced disease in the siphon regions and supraclinoid internal carotid arteries with stenosis of the left supraclinoid internal carotid artery of 80% or greater. Stenosis on the right is 60-70%. Approximately 50% stenoses in the distal vertebral arteries and in the proximal basilar artery. Diffuse atherosclerotic narrowing and irregularity in the smaller intracranial branches.  Mri and Mra Head Wo Contrast 08/10/2015   IMPRESSION: 1. Expected evolution of the recent left MCA white matter infarct. No interval hemorrhage. No associated mass effect. 2. No new intracranial abnormality. 3. Stable severe intracranial atherosclerosis on MRA. Moderate to high-grade stenoses of the distal left vertebral artery, both distal  ICA siphons, and the proximal basilar artery.   Mr Brain Wo Contrast  08/12/2015   IMPRESSION: 1. 1.5 cm recurrent acute infarct centered in the posterior left corona radiata, new from the MRI of 2 days ago. This is in the same location although is mildly larger than that on the 07/30/2015 MRI. 2. Chronic ischemic changes as above.   2D  ehco 07/30/15 - - Left ventricle: The cavity size was normal. There was moderateconcentric hypertrophy. Systolic function was vigorous. Theestimated ejection fraction was in the range of 65% to 70%. Wallmotion was normal; there were no regional wall motionabnormalities. Doppler parameters are consistent with abnormalleft ventricular relaxation (grade 1 diastolic dysfunction).Doppler parameters are consistent with high ventricular fillingpressure. - Aortic valve: Trileaflet; moderately thickened, moderately calcified leaflets especially non coronary cusp. Valve mobilitywas mildly restricted. There was very mild stenosis. Peakvelocity (S): 244 cm/s. Mean gradient (S): 13 mm Hg. Valve area(VTI): 1.42 cm^2. Valve area (Vmax): 1.35 cm^2. Valve area(Vmean): 1.17 cm^2. - Mitral valve: Calcified annulus Impressions: No cardiac source of emboli was indentified. When compared toprior, mild aortic stenosis is now present.   PHYSICAL EXAM General - Well nourished, well developed, in no apparent distress.  Ophthalmologic - Fundi not visualized due to eye movement.  Cardiovascular - Regular rhythm with no murmur.  Mental Status -  Level of arousal and orientation to time, place, and person were intact. Language including expression, naming, repetition, comprehension was assessed and found intact. Fund of Knowledge was assessed and was intact.  Cranial Nerves II - XII - II - Visual field intact OU. III, IV, VI - Extraocular movements intact. V - Facial sensation intact bilaterally. VII - right facial droop,  . VIII - Hearing & vestibular intact bilaterally. X - Palate elevates symmetrically. XI - Chin turning & shoulder shrug intact bilaterally. XII - Tongue protrusion intact.  Motor Strength - The patient's strength was 0/5 RUE and 1/5 RLE.  LUE and LLE 5/5/. Bulk was normal and fasciculations were absent.   Motor Tone - Muscle tone was assessed at the neck and appendages and was  normal.  Reflexes - The patient's reflexes were 1+ in all extremities and she had no pathological reflexes.  Sensory - Light touch, temperature/pinprick were assessed and were symmetrical.    Coordination - The patient had normal movements in the hands and feet with no ataxia or dysmetria.    Gait and Station - deferred due to high BP.   ASSESSMENT/PLAN [name] is a 81 y.o. female with history of HTN, HLD, DM, right breast cancer with stable mets to bones and soft tissue, and recent left CR stroke treated with TPA on 07/29/15 presenting with acute onset of right-sided weakness and slurred speech. She did not receive IV t-PA due to patient and family decision not to treat. Symptoms improved.  Recurrent left coronal radiation infarct - due to left terminal ICA high grade stenosis in the setting of labile BP fluctuation, with additional recurrent stroke in hospital  Resultant  Right facial drop and right mild hemiparesis  MRI new recurrent left CR infarct   MRA head stable high grade stenosis on the bilateral ICA siphone, L>R  MRA neck 07/30/2015 revealed no significant disease  2D Echo 07/30/2015 - EF 65-70%. No cardiac source of emboli identified.  LDL 07/30/2015 - 114  HgbA1c 07/30/2015 - 7.8  VTE prophylaxis - SCDs DIET DYS 3 Room service appropriate?: Yes; Fluid consistency:: Thin  aspirin 81 mg orally every day and clopidogrel  75 mg orally every day prior to admission, now on aspirin 81 mg orally every day and clopidogrel 75 mg orally every day. Given possible rectal bleeding, will stop dual antiplatelets.   Ongoing aggressive stroke risk factor management  Therapy recommendations:SNF  Disposition: Pending  B/l intracranial ICA stenosis  MRA showed left > 80% stenosis and right 60% stenosis  In the setting of labile BP, it is likely the cause of recurrent left MCA infarct  Due to recurrent left MCA infarcts, may consider left MCA stenting-discussed with  patient and daughter 08/13/15 but they are reluctant at the present time and would like her to recover in rehabilitation prior to considering revascularization  However, due to her advanced age and metastasis breast Ca, she is not a good candidate   Labile BP control  Restarted BP meds about 36 hours post event  Resumed amlodipine and losartan  Also resumed atenolol at half dose - 25mg  tid yesterday  Did not resume clonidine  However, pt had BP in 140s overnight with worsening symptoms and confirmed additional recurrent stroke during hospitalization  Changed atenolol to 25mg  bid with elimination of night dose  New BP goal 150-160  BP elevated, SGP 160-180s, continue to monitor  History of left MCA territory infarct 2 weeks ago  S/p tPA at that time  Most likely due to left terminal ICA high grade stenosis in the setting of hypotension  No significant residue on discharge   Discharged on ASA and plavix and zetia  Discharged with home health  Anxiety   Pt very anxious about her condition, about going home, about her BP, about her medications  on clonazepam bid - sedated after today's dose. Decreased by 50% bid  Hyperlipidemia  Home meds:  Zetia resumed in hospital  LDL 114, goal < 70  Lipitor intolerance  On Zetia, continue Zetia on discharge.  Diabetes  Home meds - lantus  HgbA1c 7.8, goal < 7.0  Uncontrolled  SSI  Resume lantus once on diet.  Dehydration  Add IVF, bolus with 500 cc  Check BMET  Possible rectal bleeding  Stopped aspirin and plavix  guiac stools  Check CBC  Consider GI consult  Other Stroke Risk Factors  Advanced age  Obesity, Body mass index is 31.96 kg/(m^2).   Hx stroke/TIA  Other Active Problems  Contrast allergy   Right breast cancer with stable bone, blood and soft tissue metastasis  Leukocytosis - elevated but stable - due to metastasis breast cancer  Given multiple medical issues, will get IM to assist  with general care.  Hospital day # [place] for Pager information 08/15/2015 1:50 PM  I have personally examined this patient, reviewed notes, independently viewed imaging studies, participated in medical decision making and plan of care. I have made any additions or clarifications directly to the above note. Agree with note above. Patient has had a GI bleed and this represents a complex situation given recent recurrent strokes and cavernous carotid stenosis she can no longer be on dual antiplatelets therapy due to bleeding and this puts her at increased risk for strokes. I had a long discussion of the bedside with the patient's daughter and answered questions. Plan to check medical hospitalist team consult and consider GI workup to identify source of bleeding. Hold aspirin and Plavix  Antony Contras, MD Medical Director [facility] Pager: [phone] 08/15/2015 2:59 PM  To contact Stroke Continuity provider, please refer to [email or web address] After  hours, contact General Neurology

## 2015-08-15 NOTE — Progress Notes (Signed)
Rehab Admissions Coordinator Note:  Patient was screened by Retta Diones for appropriateness for an Inpatient Acute Rehab Consult.  At this time, an inpatient rehab consult has been ordered and is pending.  Danne Baxter will follow up once consult is completed and can be reached at [phone].  Jodell Cipro M 08/15/2015, 10:01 AM

## 2015-08-15 NOTE — Progress Notes (Signed)
SLP Cancellation Note  Patient Details Name: Molly [name] MRN: [id number] DOB: 29-Apr-1923   Cancelled treatment:       Reason Eval/Treat Not Completed: Fatigue/lethargy limiting ability to participate.   Juan Quam Laurice 08/15/2015, 2:03 PM

## 2015-08-15 NOTE — Care Management Important Message (Signed)
Important Message  Patient Details  Name: Molly [name] MRN: [id number] Date of Birth: [birth year]   Medicare Important Message Given:  Yes-third notification given    Delorse Lek 08/15/2015, 11:57 AM

## 2015-08-15 NOTE — Patient Outreach (Addendum)
[place] [facility]) Care Management  08/15/2015  Molly [name] 01/05/1923 [id number]  Telephonic Care Management Note  Outreach call #1.  Patient not reached.  RN CM left a HIPAA compliant voice message with name and number and requested call back. Per Epic MR (H/o admission 08/10/15 with recurrent CVA s/p tpa/cva 9/16 and right sided weakness and right facial dropping).   Discharge plan reviewed:  Possible CIR v SNF.    Plan:  RN CM notified [facility] of admission. RN CM notified [facility] administrative assistant to discontinue [facility] Stroke calls until after patient is discharged.    Mariann Laster, RN, BSN, [facility], CCM  [facility] [employer] Management Coordinator [phone] Direct [phone] Cell [phone] Office [phone] Fax

## 2015-08-15 NOTE — Progress Notes (Signed)
I met with pt and daughter at bedside. We discussed inpt rehab venue vs SNF rehab pending her ability to participate as well as [employer] approval. Pt currently SNF recommendations from PT and OT which will be difficult to get inpt rehab approval. Pt and daughter previously have refused SNF rehab. I discussed slower paced rehab at SNF level temporarily not longterm. Pt and daughter open to investigating SNF as another option. Pt is connected to [facility] and would like to seek that as a possibility. They do not want [facility].SNF. I will clarify for pt coverage for both rehab venues with her [facility].  I will update RNCM and SW. [phone]

## 2015-08-15 NOTE — Consult Note (Signed)
[facility] Medical Consultation  Molly [name] KCL:[id number] DOB: Apr 28, 1923 DOA: 08/09/2015 PCP: Antony Blackbird, MD   Requesting physician: Dr. Leonie Man Date of consultation: 08/15/2015 Reason for consultation: General Medical Management  Oncologist:  Dr. Benay Spice  Impression/Recommendations Principal Problem:   CVA (cerebral vascular accident) [facility]) Active Problems:   Benign hypertensive heart disease without heart failure   Pure hypercholesterolemia   RBBB   Type 2 diabetes mellitus with peripheral neuropathy ([place])   CLL (chronic lymphocytic leukemia) ([place])   CVA (cerebral infarction)   Abnormal WBC count   Shock ([place])   Malignant essential hypertension   Intracranial carotid stenosis    GI Bleed Dark purple blood from below.  2 episodes.  Uncertain if this is upper or lower.   BUN is mildly elevated but BP is stable. Stroke team has temporarily held aspirin and plavix.  Dr. Oletta Lamas of [facility] has been consulted and will see the patient later today.  He advises a bolus of protonix and then 40 mg IV BID.  We will keep the patient on clear liquids only for now. CBC q8 hours, PT, PTT, Type and screen are pending.  Will transfer to step down to allow for closer monitoring.  CVA 9/16 with acute extension of CVA on 9/28, and a second new CVA on 9/30. Stroke team is primary attending.  Currently holding aspirin / plavix during acute GI bleed.  Diabetes Mellitus Currently on novolog.  CBGs are moderately controlled.  Will allow them to run slightly high while she is acutely ill.  HTN Elevated.  Continue amlodipine, Irbesartan, atenolol.  Will allow permissive htn while having a GI bleed.  Hypothyroidism Continue levothyroxine.  HLD Continue Zetia  CLL Cared for by Dr. Benay Spice.  WBC is at baseline (in the 20's)  Hx of right BRCA     [facility] will followup again tomorrow. Please contact me if I can be of assistance in the meanwhile. Thank you for this  consultation.  Chief Complaint: GI Bleed  HPI: This is an 81 year old female with a history of hypertension, hypothyroidism, hyperlipidemia, and diabetes mellitus who initially presented to [facility] on 9/16 with a right MCA infarct. She then returned on 9/28 with evolution of her previous infarct. On 9/30 she had a second acute stroke. She was placed on dual antiplatelet therapy (Plavix and aspirin). On 10/3 she was noted to have bright red blood per rectum on her bed pad in the morning. Per her daughter, [name], her second episode of bleeding was dark purple with no stool involved. Per her RN the patient was recently checked this afternoon and has had a third episode of rectal bleeding in the form of large purple spots on her bed pad (approximately a tablespoon of blood each time).  The patient has had an upper GI bleed secondary to NSAIDs in 2010 which was remedied with PAC during an upper endoscopy per her daughter's report. She had heme positive stool in 2015 and underwent both an upper and lower endoscopy. Dr. Michail Sermon removed multiple colon polyps at that time.  The patient has had no reported GI bleed since.  Her Hgb remains stable, but her BUN has risen slightly.  The patient reports that she feels poorly but is unable to give details due to difficulty speaking.  Review of Systems:  Unable to obtain due to difficulty speaking  Past Medical History  Diagnosis Date  . Hypertension   . MVP (mitral valve prolapse)   . Hypothyroidism   .  Anemia   . Hypercholesterolemia   . Heart murmur   . Type II diabetes mellitus   . Arthritis     "back; knees, hands" (05/11/2015)  . Breast cancer, right breast     "cells went into the blood and caused her hip to break" (05/11/2015)   Past Surgical History  Procedure Laterality Date  . Hemiarthroplasty hip Right     "partial replacement"  . Pelvic and para-aortic lymph node dissection    . Tonsillectomy    . Appendectomy    . Cholecystectomy     . US echocardiography  03/30/2009    EF 55-60%  . Cardiovascular stress test  01/28/2008    EF 74%  . Esophagogastroduodenoscopy N/A 07/08/2014    Procedure: ESOPHAGOGASTRODUODENOSCOPY (EGD);  Surgeon: Winfield Cunas., MD;  Location: [facility];  Service: Endoscopy;  Laterality: N/A;  . Colonoscopy N/A 07/10/2014    Procedure: COLONOSCOPY;  Surgeon: Lear Ng, MD;  Location: [facility];  Service: Endoscopy;  Laterality: N/A;  . Total abdominal hysterectomy      w/BSO  . Dilation and curettage of uterus    . Breast biopsy Right 2009  . Breast lumpectomy Right 2009  . Fracture surgery    . Cataract extraction w/ intraocular lens  implant, bilateral Bilateral    Social History:  reports that she has never smoked. She has never used smokeless tobacco. She reports that she does not drink alcohol or use illicit drugs.  Allergies  Allergen Reactions  . Amlodipine Swelling    Currently taking exforge, separate med-Amlodipiine caused lower leg edema  . Contrast Media [Iodinated Diagnostic Agents] Nausea Only and Other (See Comments)    Patient experienced chills, nausea, hypothermic symptoms, also weird feelings  . Hydralazine Other (See Comments)    Severe hypotension-suddenly and drastically dropped BP  . Metoprolol Other (See Comments)    Severe hypotension-suddenly and drastically dropped BP, same as hydralazine  . Codeine Other (See Comments)    Makes her feel "crazy."  . Demerol Other (See Comments)    Makes her feel "crazy."  . Librium Other (See Comments)    Makes her feel "crazy."  . Lipitor [Atorvastatin Calcium] Other (See Comments)    Makes her feel "crazy."   Family History  Problem Relation Age of Onset  . Hypertension Mother   . Heart attack Father   . Hypertension Father   . Diabetes Sister   . Hypertension Sister   . Liver cancer Sister   . Heart attack Brother   . Hypertension Brother   . Diabetes Brother   . Diabetes Sister   . Hypertension  Sister     Prior to Admission medications   Medication Sig Start Date End Date Taking? Authorizing Provider  acetaminophen (TYLENOL) 500 MG tablet Take 500 mg by mouth every 6 (six) hours as needed (pain).   Yes Historical Provider, MD  amLODipine-valsartan (EXFORGE) 10-320 MG per tablet Take 1 tablet by mouth daily. 03/22/15  Yes Darlin Coco, MD  anastrozole (ARIMIDEX) 1 MG tablet TAKE 1 TABLET BY MOUTH EVERY DAY 02/28/15  Yes Ladell Pier, MD  aspirin EC 81 MG tablet Take 81 mg by mouth daily.   Yes Historical Provider, MD  atenolol (TENORMIN) 50 MG tablet Take 1 tablet (50 mg total) by mouth 3 (three) times daily. 07/06/15  Yes Darlin Coco, MD  Calcium Carbonate-Vitamin D (CALCIUM + D PO) Take 1 tablet by mouth daily after lunch.    Yes Historical Provider,  MD  cephALEXin (KEFLEX) 500 MG capsule Take 1 capsule (500 mg total) by mouth every 12 (twelve) hours. 08/02/15  Yes David L Rinehuls, PA-C  cholecalciferol (VITAMIN D) 1000 UNITS tablet Take 1,000 Units by mouth daily after lunch.    Yes Historical Provider, MD  cloNIDine (CATAPRES) 0.1 MG tablet Take 1 tablet (0.1 mg total) by mouth 2 (two) times daily. 08/02/15  Yes David L Rinehuls, PA-C  clopidogrel (PLAVIX) 75 MG tablet Take 1 tablet (75 mg total) by mouth daily. 08/02/15  Yes David L Rinehuls, PA-C  Cyanocobalamin (B-12) 500 MCG TABS Take 500 mcg by mouth daily after lunch.    Yes Historical Provider, MD  docusate sodium (COLACE) 100 MG capsule Take 2 capsules (200 mg total) by mouth 2 (two) times daily as needed for mild constipation. 06/21/15  Yes Thurnell Lose, MD  FERROCITE 324 MG TABS TAKE 1 TABLET (106 MG OF IRON TOTAL) BY MOUTH DAILY. Patient taking differently: TAKE 1 TABLET (106 MG OF IRON TOTAL) BY MOUTH DAILY AFTER LUNCH 02/28/15  Yes Ladell Pier, MD  LANTUS SOLOSTAR 100 UNIT/ML Solostar Pen Inject 18 Units into the skin daily at 12 noon.  07/01/15  Yes Historical Provider, MD  levothyroxine (SYNTHROID,  LEVOTHROID) 75 MCG tablet Take 75 mcg by mouth daily before breakfast.    Yes Historical Provider, MD  potassium chloride (KLOR-CON M10) 10 MEQ tablet TAKE 1 TABLET (10 MEQ TOTAL) BY MOUTH DAILY. Patient taking differently: Take 10 mEq by mouth daily after lunch.  09/07/14  Yes Darlin Coco, MD  spironolactone (ALDACTONE) 25 MG tablet 1/2 tablet by mouth daily Patient taking differently: Take 12.5 mg by mouth daily.  08/09/15  Yes Darlin Coco, MD  ezetimibe (ZETIA) 10 MG tablet Take 1 tablet (10 mg total) by mouth daily. Patient not taking: Reported on 08/08/2015 09/07/14   Darlin Coco, MD   Physical Exam: Blood pressure 188/59, pulse 65, temperature 97.8 F (36.6 C), temperature source Oral, resp. rate 20, height 5' 4"  (1.626 m), weight 84.5 kg (186 lb 4.6 oz), SpO2 98 %. Filed Vitals:   08/15/15 1356  BP: 188/59  Pulse: 65  Temp: 97.8 F (36.6 C)  Resp: 20     General:  Elderly frail female who appears chronically ill.  Eyes: Sclera clear, pupils equal and round  ENT: No exudates or erythema in her oropharynx.  Neck: Supple, no lymphadenopathy  Cardiovascular: Regular rate and rhythm, + systolic murmur 3/6  Respiratory: Clear to auscultation anteriorly, no increased work of breathing  Abdomen: Soft, nontender, nondistended, positive bowel sounds  Skin: Large hematoma noted on left arm, small bruises on lower abdomen  Musculoskeletal: Unable to move right sided extremities. Sensation equal and intact in all extremities  Psychiatric: Unable to truly assess as the patient is slightly lethargic and difficult to understand  Labs on Admission:  Basic Metabolic Panel:  Recent Labs Lab 08/10/15 0022 08/10/15 0030 08/11/15 0230 08/15/15 1056  NA 135 134* 135 134*  K 3.9 3.8 3.4* 4.6  CL 99* 101 102 100*  CO2  --  22 23 25   GLUCOSE 178* 183* 173* 200*  BUN 20 15 12  23*  CREATININE 0.80 0.89 0.84 0.90  CALCIUM  --  9.7 8.9 9.1   Liver Function  Tests:  Recent Labs Lab 08/10/15 0030  AST 24  ALT 22  ALKPHOS 80  BILITOT 0.6  PROT 6.3*  ALBUMIN 3.4*   CBC:  Recent Labs Lab 08/10/15 0022 08/10/15 0030 08/11/15  0230 08/15/15 1056  WBC  --  26.7* 26.5* 20.5*  NEUTROABS  --  9.3*  --   --   HGB 15.0 13.5 12.0 12.4  HCT 44.0 41.4 37.6 38.3  MCV  --  92.0 91.0 93.2  PLT  --  278 282 294   Cardiac Enzymes:  Recent Labs Lab 08/10/15 0819 08/10/15 1524 08/10/15 2218  TROPONINI <0.03 <0.03 <0.03   CBG:  Recent Labs Lab 08/14/15 1419 08/14/15 1808 08/14/15 2135 08/15/15 0621 08/15/15 1150  GLUCAP 162* 178* 128* 164* 165*    Radiological Exams on Admission: Ct Head Wo Contrast  08/14/2015   CLINICAL DATA:  Follow up stroke.  RIGHT-sided weakness.  EXAM: CT HEAD WITHOUT CONTRAST  TECHNIQUE: Contiguous axial images were obtained from the base of the skull through the vertex without intravenous contrast.  COMPARISON:  Most recent MR 08/12/2015. Most recent CT head 08/12/2015.  FINDINGS: Increasingly well defined hypoattenuation in the LEFT centrum semiovale representing the area of restricted diffusion on the recent MR. No definite new areas of acute infarction. No hemorrhagic transformation. Baseline atrophy with small vessel disease. Calvarium intact. No sinus or acute mastoid disease. Vascular calcification.  IMPRESSION: Increasingly well-defined hypoattenuation representing acute infarction LEFT MCA territory. No hemorrhagic transformation.   Electronically Signed   By: Staci Righter M.D.   On: 08/14/2015 17:14    EKG:   Time spent: 45 min.  Karen Kitchens [facility] Pager [phone]  If 7PM-7AM, please contact night-coverage [email or web address] Password [email or web address] 08/15/2015, 3:52 PM

## 2015-08-15 NOTE — Consult Note (Signed)
Physical Medicine and Rehabilitation Consult Reason for Consult: Left corona radiata infarct Referring Physician: Dr. Leonie Man   HPI: Molly [name] is a 81 y.o. right handed female with history of hypertension, right breast cancer, type 2 diabetes mellitus with peripheral neuropathy, mitral valve prolapse. Patient lives with family and has 24-hour care available was using a 4 wheeled walker at time of recent discharge. Patient with noted left basal ganglia infarct radiating right matter tracts 07/29/2015 received TPA discharge to home with resolution of right sided weakness maintained on aspirin and Plavix. Patient presented 08/10/2015 with increasing right sided weakness and slurred speech. CT/MRI showed a 1.5 cm recurrent acute infarct centered in the posterior left corona radiata new from prior MRI 2 days ago. Recent echocardiogram with ejection fraction of 85% grade 1 diastolic dysfunction. MRA of the neck 07/30/2015 showed greater than 80% stenosis left ICA was 60-70% stenosis right side. Patient with transient hypotension 08/11/2015 received 500 mL fluid bolus. Neurology consult continues regimen of aspirin 81 mg daily as well as Plavix. Mechanical soft thin liquid diet. Await current plan for vascular surgery for carotid stenosis along with improvement in RR and stabilization of BP. Physical and occupational therapy evaluations completed 08/11/2015 with recommendations of physical medicine rehabilitation consult.   Review of Systems  Constitutional: Negative for fever and chills.  HENT: Positive for hearing loss.   Eyes: Negative for blurred vision and double vision.  Respiratory: Negative for cough and shortness of breath.   Cardiovascular: Positive for palpitations. Negative for chest pain and leg swelling.  Gastrointestinal: Positive for constipation. Negative for nausea and vomiting.  Musculoskeletal: Positive for myalgias and joint pain.  Skin: Negative for rash.    Neurological: Positive for weakness. Negative for focal weakness, seizures and headaches.  All other systems reviewed and are negative.  Past Medical History  Diagnosis Date  . Hypertension   . MVP (mitral valve prolapse)   . Hypothyroidism   . Anemia   . Hypercholesterolemia   . Heart murmur   . Type II diabetes mellitus   . Arthritis     "back; knees, hands" (05/11/2015)  . Breast cancer, right breast     "cells went into the blood and caused her hip to break" (05/11/2015)   Past Surgical History  Procedure Laterality Date  . Hemiarthroplasty hip Right     "partial replacement"  . Pelvic and para-aortic lymph node dissection    . Tonsillectomy    . Appendectomy    . Cholecystectomy    . US echocardiography  03/30/2009    EF 55-60%  . Cardiovascular stress test  01/28/2008    EF 74%  . Esophagogastroduodenoscopy N/A 07/08/2014    Procedure: ESOPHAGOGASTRODUODENOSCOPY (EGD);  Surgeon: Winfield Cunas., MD;  Location: [facility];  Service: Endoscopy;  Laterality: N/A;  . Colonoscopy N/A 07/10/2014    Procedure: COLONOSCOPY;  Surgeon: Lear Ng, MD;  Location: [facility];  Service: Endoscopy;  Laterality: N/A;  . Total abdominal hysterectomy      w/BSO  . Dilation and curettage of uterus    . Breast biopsy Right 2009  . Breast lumpectomy Right 2009  . Fracture surgery    . Cataract extraction w/ intraocular lens  implant, bilateral Bilateral    Family History  Problem Relation Age of Onset  . Hypertension Mother   . Heart attack Father   . Hypertension Father   . Diabetes Sister   . Hypertension Sister   . Liver  cancer Sister   . Heart attack Brother   . Hypertension Brother   . Diabetes Brother   . Diabetes Sister   . Hypertension Sister    Social History:  reports that she has never smoked. She has never used smokeless tobacco. She reports that she does not drink alcohol or use illicit drugs. Allergies:  Allergies  Allergen Reactions  .  Amlodipine Swelling    Currently taking exforge, separate med-Amlodipiine caused lower leg edema  . Contrast Media [Iodinated Diagnostic Agents] Nausea Only and Other (See Comments)    Patient experienced chills, nausea, hypothermic symptoms, also weird feelings  . Hydralazine Other (See Comments)    Severe hypotension-suddenly and drastically dropped BP  . Metoprolol Other (See Comments)    Severe hypotension-suddenly and drastically dropped BP, same as hydralazine  . Codeine Other (See Comments)    Makes her feel "crazy."  . Demerol Other (See Comments)    Makes her feel "crazy."  . Librium Other (See Comments)    Makes her feel "crazy."  . Lipitor [Atorvastatin Calcium] Other (See Comments)    Makes her feel "crazy."   Medications Prior to Admission  Medication Sig Dispense Refill  . acetaminophen (TYLENOL) 500 MG tablet Take 500 mg by mouth every 6 (six) hours as needed (pain).    Marland Kitchen amLODipine-valsartan (EXFORGE) 10-320 MG per tablet Take 1 tablet by mouth daily. 30 tablet 5  . anastrozole (ARIMIDEX) 1 MG tablet TAKE 1 TABLET BY MOUTH EVERY DAY 30 tablet 5  . aspirin EC 81 MG tablet Take 81 mg by mouth daily.    Marland Kitchen atenolol (TENORMIN) 50 MG tablet Take 1 tablet (50 mg total) by mouth 3 (three) times daily. 90 tablet 5  . Calcium Carbonate-Vitamin D (CALCIUM + D PO) Take 1 tablet by mouth daily after lunch.     . cephALEXin (KEFLEX) 500 MG capsule Take 1 capsule (500 mg total) by mouth every 12 (twelve) hours. 12 capsule 0  . cholecalciferol (VITAMIN D) 1000 UNITS tablet Take 1,000 Units by mouth daily after lunch.     . cloNIDine (CATAPRES) 0.1 MG tablet Take 1 tablet (0.1 mg total) by mouth 2 (two) times daily. 60 tablet 3  . clopidogrel (PLAVIX) 75 MG tablet Take 1 tablet (75 mg total) by mouth daily. 30 tablet 3  . Cyanocobalamin (B-12) 500 MCG TABS Take 500 mcg by mouth daily after lunch.     . docusate sodium (COLACE) 100 MG capsule Take 2 capsules (200 mg total) by mouth 2  (two) times daily as needed for mild constipation. 10 capsule 0  . FERROCITE 324 MG TABS TAKE 1 TABLET (106 MG OF IRON TOTAL) BY MOUTH DAILY. (Patient taking differently: TAKE 1 TABLET (106 MG OF IRON TOTAL) BY MOUTH DAILY AFTER LUNCH) 60 tablet 5  . LANTUS SOLOSTAR 100 UNIT/ML Solostar Pen Inject 18 Units into the skin daily at 12 noon.   5  . levothyroxine (SYNTHROID, LEVOTHROID) 75 MCG tablet Take 75 mcg by mouth daily before breakfast.     . potassium chloride (KLOR-CON M10) 10 MEQ tablet TAKE 1 TABLET (10 MEQ TOTAL) BY MOUTH DAILY. (Patient taking differently: Take 10 mEq by mouth daily after lunch. ) 30 tablet 11  . spironolactone (ALDACTONE) 25 MG tablet 1/2 tablet by mouth daily (Patient taking differently: Take 12.5 mg by mouth daily. ) 15 tablet 3  . ezetimibe (ZETIA) 10 MG tablet Take 1 tablet (10 mg total) by mouth daily. (Patient not taking: Reported on  08/08/2015) 30 tablet 11    Home: Home Living Family/patient expects to be discharged to:: Skilled nursing facility Living Arrangements: Children Available Help at Discharge: Family, Friend(s), Available 24 hours/day Type of Home: House Home Access: Stairs to enter [employer] of Steps: 1 Entrance Stairs-Rails: Right, Left Home Layout: One level Bathroom Shower/Tub: Tub/shower unit, Architectural technologist: Standard Bathroom Accessibility: Yes Home Equipment: Environmental consultant - 2 wheels, Bedside commode, Walker - 4 wheels  Lives With: Daughter  Functional History: Prior Function Level of Independence: Needs assistance Gait / Transfers Assistance Needed: ambulates with 4 [employer] ADL's / Homemaking Assistance Needed: sponge bathes and dresses herself, toilets using 3 in 1, daughter does all housekeeping and cooking, pt can warm meals Comments: rollator walker for ambulation Functional Status:  Mobility: Bed Mobility Overal bed mobility: Needs Assistance Bed Mobility: Rolling, Sidelying to Sit, Sit to Supine Rolling: Mod  assist Sidelying to sit: Max assist, +2 for physical assistance Supine to sit: Mod assist Sit to supine: Max assist, +2 for physical assistance General bed mobility comments: pt able to initiate rolling to the R but increased difficulty to the Left. Pt required maxA at trunk for elevation and R LE for management on/off bed Transfers Overall transfer level: Needs assistance Equipment used:  (2 person lift with bed bac) Transfers: Sit to/from Stand Sit to Stand: Max assist, +2 physical assistance General transfer comment: unable to achieve full upright standing due to R knee instability and pt c/o dizziness. pt complete 3 mod sit to stands to scoot along EOB to [facility] Ambulation/Gait General Gait Details: not able    ADL: ADL Overall ADL's : Needs assistance/impaired Eating/Feeding: NPO (awaiting SLP assessment ) Grooming: Wash/dry hands, Wash/dry face, Brushing hair, Minimal assistance, Bed level Upper Body Bathing: Maximal assistance, Sitting, Bed level Lower Body Bathing: Maximal assistance, Bed level Upper Body Dressing : Maximal assistance, Sitting Lower Body Dressing: Total assistance, Bed level Toilet Transfer: Total assistance Toilet Transfer Details (indicate cue type and reason): unable to safely perform Toileting- Clothing Manipulation and Hygiene: Total assistance, Bed level Functional mobility during ADLs: Total assistance General ADL Comments: Pt's daughter present during eval   Cognition: Cognition Overall Cognitive Status: Within Functional Limits for tasks assessed Orientation Level: Oriented X4 Cognition Arousal/Alertness: Awake/alert Behavior During Therapy: Flat affect Overall Cognitive Status: Within Functional Limits for tasks assessed  Blood pressure 172/60, pulse 69, temperature 97.7 F (36.5 C), temperature source Oral, resp. rate 19, height 5\' 4"  (1.626 m), weight 84.5 kg (186 lb 4.6 oz), SpO2 100 %. Physical Exam  Nursing note and vitals  reviewed. Constitutional: She appears well-developed and well-nourished.  HENT:  Head: Normocephalic and atraumatic.  Right  facial weakness.  Eyes: Conjunctivae and EOM are normal.  Pupils reactive to light  Neck: Normal range of motion. Neck supple. No thyromegaly present.  Cardiovascular: Normal rate and regular rhythm.   Respiratory: Effort normal and breath sounds normal. No respiratory distress.  GI: Soft. Bowel sounds are normal. She exhibits no distension.  Musculoskeletal: She exhibits no tenderness.  Normal PROM RUE 0/5 RLE hip flex 2/5, knee ext 2/5, ankle dorsi/plantar flexion 0/5 LUE/LLE: 4+/5  Neurological: She is alert.  Facial asymetry with left sided weakness, otherwise CN II-XII grossly intact. Sensation to light touch intact Speech is dysarthric but intelligible. She can provide her name, place and date of birth. Fair awareness of deficits. Follows simple commands  Skin: Skin is warm and dry.  Psychiatric: She has a normal mood and affect. Her behavior is  normal.    Results for orders placed or performed during the hospital encounter of 08/09/15 (from the past 24 hour(s))  Glucose, capillary     Status: Abnormal   Collection Time: 08/14/15  1:23 PM  Result Value Ref Range   Glucose-Capillary 163 (H) 65 - 99 mg/dL  Glucose, capillary     Status: Abnormal   Collection Time: 08/14/15  2:19 PM  Result Value Ref Range   Glucose-Capillary 162 (H) 65 - 99 mg/dL   Comment 1 Notify RN    Comment 2 Document in Chart   Glucose, capillary     Status: Abnormal   Collection Time: 08/14/15  6:08 PM  Result Value Ref Range   Glucose-Capillary 178 (H) 65 - 99 mg/dL  Glucose, capillary     Status: Abnormal   Collection Time: 08/14/15  9:35 PM  Result Value Ref Range   Glucose-Capillary 128 (H) 65 - 99 mg/dL   Comment 1 Notify RN    Comment 2 Document in Chart   Glucose, capillary     Status: Abnormal   Collection Time: 08/15/15  6:21 AM  Result Value Ref Range    Glucose-Capillary 164 (H) 65 - 99 mg/dL   Comment 1 Notify RN    Comment 2 Document in Chart    Ct Head Wo Contrast  08/14/2015   CLINICAL DATA:  Follow up stroke.  RIGHT-sided weakness.  EXAM: CT HEAD WITHOUT CONTRAST  TECHNIQUE: Contiguous axial images were obtained from the base of the skull through the vertex without intravenous contrast.  COMPARISON:  Most recent MR 08/12/2015. Most recent CT head 08/12/2015.  FINDINGS: Increasingly well defined hypoattenuation in the LEFT centrum semiovale representing the area of restricted diffusion on the recent MR. No definite new areas of acute infarction. No hemorrhagic transformation. Baseline atrophy with small vessel disease. Calvarium intact. No sinus or acute mastoid disease. Vascular calcification.  IMPRESSION: Increasingly well-defined hypoattenuation representing acute infarction LEFT MCA territory. No hemorrhagic transformation.   Electronically Signed   By: Staci Righter M.D.   On: 08/14/2015 17:14    Assessment/Plan: Diagnosis: Recurrent Left CVA 1. Does the need for close, 24 hr/day medical supervision in concert with the patient's rehab needs make it unreasonable for this patient to be served in a less intensive setting? Yes 2. Co-Morbidities requiring supervision/potential complications: Previous CVA, DM, carotid stenosis, HTN, tachypnea 3. Due to safety, disease management, medication administration and patient education, does the patient require 24 hr/day rehab nursing? Yes 4. Does the patient require coordinated care of a physician, rehab nurse, PT (1-2 hrs/day, 5 days/week), OT (1-2 hrs/day, 5 days/week) and SLP (1-2 hrs/day, 5 days/week) to address physical and functional deficits in the context of the above medical diagnosis(es)? Yes Addressing deficits in the following areas: balance, endurance, locomotion, strength, transferring, bathing, dressing, feeding, grooming, toileting, speech and psychosocial support 5. Can the patient  actively participate in an intensive therapy program of at least 3 hrs of therapy per day at least 5 days per week? Potentially 6. The potential for patient to make measurable gains while on inpatient rehab is good 7. Anticipated functional outcomes upon discharge from inpatient rehab are min assist and mod assist  with PT, min assist with OT, modified independent and supervision with SLP. 8. Estimated rehab length of stay to reach the above functional goals is: 14-18 days. 9. Does the patient have adequate social supports and living environment to accommodate these discharge functional goals? Potentially 10. Anticipated D/C setting: Home 11.  Anticipated post D/C treatments: HH therapy and Home excercise program 12. Overall Rehab/Functional Prognosis: good  RECOMMENDATIONS: This patient's condition is appropriate for continued rehabilitative care in the following setting: CIR Patient has agreed to participate in recommended program. Yes Note that insurance prior authorization may be required for reimbursement for recommended care.  Comment: Rehab Admissions Coordinator to follow up ongoing medical issues as well as confirmation of home support on discharge.    08/15/2015

## 2015-08-15 NOTE — Progress Notes (Signed)
Physical Therapy Treatment Patient Details Name: Molly [name] MRN: [id number] DOB: 01/03/1923 Today's Date: 08/15/2015    History of Present Illness This 81 y.o. female admitted with Rt sided weakness.  MRI showed expected evolution of recent Lt MCA infarct. Family decided against tPA  Pt with worsening Rt sided weakness 9/30 am.  Repeat MRI showed 1.5 cm recurrent acute infarct centered in the Lt corona radiata new since previous MRI (spoke with RN who ok'd OT evaluation).  PMH includes:  recent  Lt basal ganglia infarct s/p tPA 07/29/15; anemia, HTN, DM, metastatic breast CA, s/p Rt hemiarthroplasty  2011,     PT Comments    Pt con't to present with R UE flaccidity but today presented with trace mvmt in R LE. Pt able attempt to ADD and perform quad set. Pt sitting EOB tolerance limited by dizziness at EOB. Pt reports dizziness to be lightheadedness not room spining and also reported seeing black spots. Dark red blood coming from rectum noted. RN notified. Pt con't to require maximal assist for all mobility. Pt would benefit from CIR however unsure family can provide 24/7 assist upon d/c so may need to look at SNF as patient will be unsafe to return home alone.  Follow Up Recommendations  Supervision/Assistance - 24 hour;CIR     Equipment Recommendations  None recommended by PT    Recommendations for Other Services Rehab consult     Precautions / Restrictions Precautions Precautions: Fall Precaution Comments: dense R hemi Restrictions Weight Bearing Restrictions: No    Mobility  Bed Mobility Overal bed mobility: Needs Assistance Bed Mobility: Rolling;Sidelying to Sit;Sit to Supine Rolling: Mod assist Sidelying to sit: Max assist;+2 for physical assistance   Sit to supine: Max assist;+2 for physical assistance   General bed mobility comments: pt able to initiate rolling to the R but increased difficulty to the Left. Pt required maxA at trunk for elevation and R LE for  management on/off bed  Transfers Overall transfer level: Needs assistance Equipment used:  (2 person lift with bed bac) Transfers: Sit to/from Stand Sit to Stand: Max assist;+2 physical assistance         General transfer comment: unable to achieve full upright standing due to R knee instability and pt c/o dizziness. pt complete 3 mod sit to stands to scoot along EOB to [facility]  Ambulation/Gait                 Stairs            Wheelchair Mobility    Modified Rankin (Stroke Patients Only) Modified Rankin (Stroke Patients Only) Pre-Morbid Rankin Score: No symptoms Modified Rankin: Severe disability     Balance Overall balance assessment: Needs assistance Sitting-balance support: Single extremity supported;Feet supported Sitting balance-Leahy Scale: Poor Sitting balance - Comments: minA, max encouragement to self correct to midline from L lateral lean Postural control: Left lateral lean                          Cognition Arousal/Alertness: Awake/alert Behavior During Therapy: Flat affect Overall Cognitive Status: Within Functional Limits for tasks assessed                      Exercises      General Comments General comments (skin integrity, edema, etc.): during rolling pt with noted dark red blood clots from rectum. RN notified who called Neuro team      Pertinent Vitals/Pain Pain Assessment:  No/denies pain    Home Living                      Prior Function            PT Goals (current goals can now be found in the care plan section) Acute Rehab PT Goals Patient Stated Goal: did not state Progress towards PT goals: Progressing toward goals    Frequency  Min 4X/week    PT Plan Current plan remains appropriate    Co-evaluation             End of Session   Activity Tolerance: Patient tolerated treatment well Patient left: in bed;with call bell/phone within reach;with bed alarm set     Time: 4103-0131 PT  Time Calculation (min) (ACUTE ONLY): 28 min  Charges:  $Therapeutic Activity: 23-37 mins                    G CodesKingsley Callander 08/15/2015, 9:53 AM  Kittie Plater, PT, DPT Pager #: [phone] Office #: [phone]

## 2015-08-16 ENCOUNTER — Other Ambulatory Visit: Payer: Medicare Other

## 2015-08-16 DIAGNOSIS — I6359 Cerebral infarction due to unspecified occlusion or stenosis of other cerebral artery: Secondary | ICD-10-CM

## 2015-08-16 DIAGNOSIS — K922 Gastrointestinal hemorrhage, unspecified: Secondary | ICD-10-CM

## 2015-08-16 LAB — BASIC METABOLIC PANEL
ANION GAP: 10 (ref 5–15)
BUN: 14 mg/dL (ref 6–20)
CALCIUM: 8.7 mg/dL — AB (ref 8.9–10.3)
CO2: 21 mmol/L — ABNORMAL LOW (ref 22–32)
CREATININE: 0.83 mg/dL (ref 0.44–1.00)
Chloride: 104 mmol/L (ref 101–111)
GFR calc Af Amer: >60 mL/min (ref >60)
GLUCOSE: 214 mg/dL — AB (ref 65–99)
Potassium: 4.4 mmol/L (ref 3.5–5.1)
Sodium: 135 mmol/L (ref 135–145)

## 2015-08-16 LAB — GLUCOSE, CAPILLARY
GLUCOSE-CAPILLARY: 175 mg/dL — AB (ref 65–99)
Glucose-Capillary: 140 mg/dL — ABNORMAL HIGH (ref 65–99)
Glucose-Capillary: 137 mg/dL — ABNORMAL HIGH (ref 65–99)
Glucose-Capillary: 148 mg/dL — ABNORMAL HIGH (ref 65–99)
Glucose-Capillary: 138 mg/dL — ABNORMAL HIGH (ref 65–99)

## 2015-08-16 LAB — CBC
HCT: 36.3 % (ref 36.0–46.0)
HEMATOCRIT: 35.7 % — AB (ref 36.0–46.0)
HEMATOCRIT: 34 % — AB (ref 36.0–46.0)
HEMOGLOBIN: 12 g/dL (ref 12.0–15.0)
Hemoglobin: 12.2 g/dL (ref 12.0–15.0)
Hemoglobin: 11.2 g/dL — ABNORMAL LOW (ref 12.0–15.0)
MCH: 30.6 pg (ref 26.0–34.0)
MCH: 31.1 pg (ref 26.0–34.0)
MCH: 30.3 pg (ref 26.0–34.0)
MCHC: 33.6 g/dL (ref 30.0–36.0)
MCHC: 33.6 g/dL (ref 30.0–36.0)
MCHC: 32.9 g/dL (ref 30.0–36.0)
MCV: 91.1 fL (ref 78.0–100.0)
MCV: 92.6 fL (ref 78.0–100.0)
MCV: 91.9 fL (ref 78.0–100.0)
PLATELETS: 268 10*3/uL (ref 150–400)
Platelets: 275 10*3/uL (ref 150–400)
Platelets: 284 10*3/uL (ref 150–400)
RBC: 3.92 MIL/uL (ref 3.87–5.11)
RBC: 3.92 MIL/uL (ref 3.87–5.11)
RBC: 3.7 MIL/uL — AB (ref 3.87–5.11)
RDW: 13.7 % (ref 11.5–15.5)
RDW: 13.8 % (ref 11.5–15.5)
RDW: 13.7 % (ref 11.5–15.5)
WBC: 21.4 10*3/uL — ABNORMAL HIGH (ref 4.0–10.5)
WBC: 19.5 10*3/uL — AB (ref 4.0–10.5)
WBC: 25.8 10*3/uL — ABNORMAL HIGH (ref 4.0–10.5)

## 2015-08-16 MED ORDER — ATENOLOL 25 MG PO TABS
50.0000 mg | ORAL_TABLET | Freq: Three times a day (TID) | ORAL | Status: DC
  Administered 2015-08-17 – 2015-08-18 (×5): 50 mg via ORAL
  Filled 2015-08-16 (×5): qty 2

## 2015-08-16 MED ORDER — CLONIDINE HCL 0.1 MG PO TABS
0.1000 mg | ORAL_TABLET | Freq: Two times a day (BID) | ORAL | Status: DC
  Administered 2015-08-17 – 2015-08-18 (×3): 0.1 mg via ORAL
  Filled 2015-08-16 (×3): qty 1

## 2015-08-16 MED ORDER — CLONIDINE HCL 0.1 MG PO TABS
0.1000 mg | ORAL_TABLET | Freq: Once | ORAL | Status: AC
  Administered 2015-08-16: 0.1 mg via ORAL
  Filled 2015-08-16: qty 1

## 2015-08-16 NOTE — Progress Notes (Signed)
PATIENT DETAILS Name: Molly [name] Age: 81 y.o. Sex: female Date of Birth: 05/22/1923 Admit Date: 08/09/2015 Admitting Physician Drema Dallas, DO EYC:XKGY, CAMMIE, MD  Subjective: 1 episode of hematochezia is morning. Significant right-sided hemiparesis.  Assessment/Plan: Principal Problem:  Lower GI bleeding: Suspect diverticular. Last colonoscopy approximately one year back per gastroenterology-showed hemorrhoids, several polyps and diverticulosis. Doubt patient will be able to tolerate a full bowel prep, very frail-and at risk for periprocedural complications. This M.D.-spoke with daughter-if GI bleeding worsens-apart from supportive care nothing much more to offer. Thankfully, GI bleeding seems to have slowed down significantly, if it reoccurs-consider palliative care consult. Family and patient aware of poor overall prognoses. Would continue to hold antiplatelets one week, and then either resume aspirin or Plavix.  CVA 9/16 with acute extension of CVA on 9/28, and a second new CVA on 9/30: Difficult situation-ideally needs dual antiplatelets for significant intracranial vascular stenosis-but now with lower GI bleeding. Long discussion with her daughter at bedside by this M.D.-explained limitations in medical care, and that patient remained at high risk for recurrent CVA. Family very understanding, continue with either aspirin or Plavix in 1 week if no further GI bleeding.  Goals of care: Has multiple medical comorbidities-with recurrent CVA and now with significant right-sided hemiplegia, stage IV breast cancer, CLL, hospital course now complicated by development of probable diverticular bleeding. Now that the patient is essentially bed to wheelchair bound-probably will be of risk of malnutrition, infections etc. Family aware of poor overall prognosis-family also aware that if patient deteriorates-Will need initiation of hospice/palliative measures at some point.  However for now, family agreeable to discharge to SNF-would have palliative care consultation done at [facility].  History of stage IV breast cancer and CLL: Followed by oncology-Dr Learta Codding.  Type 2 diabetes: CBGs stable with SSI  Essential hypertension: Controlled with lisinopril/amlodipine and Avapro  Hospitalist will sign off-above recommendations discussed with Dr. Leonie Man. Please reconsult/call if we can be of further assistance  Anti-infectives    None      MEDICATIONS: Scheduled Meds: . amLODipine  10 mg Oral Daily   And  . irbesartan  300 mg Oral Daily  . atenolol  25 mg Oral BID  . clonazePAM  0.25 mg Oral BID  . cyanocobalamin  500 mcg Oral QPC lunch  . docusate sodium  200 mg Oral BID  . ezetimibe  10 mg Oral Daily  . insulin aspart  0-9 Units Subcutaneous TID AC & HS  . levothyroxine  75 mcg Oral QAC breakfast  . lisinopril  10 mg Oral Daily  . pantoprazole (PROTONIX) IV  40 mg Intravenous Q12H  . polyethylene glycol  17 g Oral TID   Continuous Infusions: . sodium chloride 75 mL/hr at 08/16/15 0600   PRN Meds:.    PHYSICAL EXAM: Vital signs in last 24 hours: Filed Vitals:   08/16/15 0233 08/16/15 0550 08/16/15 1034 08/16/15 1335  BP: 192/43 195/56 150/64 145/55  Pulse:  64 67 59  Temp:  97.8 F (36.6 C) 97.7 F (36.5 C) 97.7 F (36.5 C)  TempSrc:  Oral Oral Oral  Resp:  20 20 20   Height:      Weight:      SpO2:  98% 100% 100%    Weight change:  Filed Weights   08/10/15 0016 08/10/15 0320 08/11/15 1529  Weight: 86.002 kg (189 lb 9.6 oz) 84.8 kg (186 lb 15.2 oz)  84.5 kg (186 lb 4.6 oz)   Body mass index is 31.96 kg/(m^2).   Gen Exam: Awake and alert-not in any distress Neck: Supple, No JVD.   Chest: B/L Clear.   CVS: S1 S2 Regular, no murmurs.  Abdomen: soft, BS +, non tender, non distended.  Extremities: no edema, lower extremities warm to touch. Neurologic: Right-sided hemiplegia Skin: No Rash.   Wounds: N/A.   Intake/Output from previous  day:  Intake/Output Summary (Last 24 hours) at 08/16/15 1445 Last data filed at 08/16/15 0811  Gross per 24 hour  Intake 1678.75 ml  Output    700 ml  Net 978.75 ml     LAB RESULTS: CBC  Recent Labs Lab 08/10/15 0030  08/15/15 1056 08/15/15 1521 08/15/15 2212 08/16/15 0715 08/16/15 0954  WBC 26.7*  < > 20.5* 22.9* 22.2* 21.4* 19.5*  HGB 13.5  < > 12.4 11.3* 11.2* 12.0 12.2  HCT 41.4  < > 38.3 35.6* 35.0* 35.7* 36.3  PLT 278  < > 294 295 278 275 268  MCV 92.0  < > 93.2 92.7 92.8 91.1 92.6  MCH 30.0  < > 30.2 29.4 29.7 30.6 31.1  MCHC 32.6  < > 32.4 31.7 32.0 33.6 33.6  RDW 13.9  < > 13.7 13.8 13.6 13.7 13.8  LYMPHSABS 15.5*  --   --   --   --   --   --   MONOABS 1.6*  --   --   --   --   --   --   EOSABS 0.3  --   --   --   --   --   --   BASOSABS 0.0  --   --   --   --   --   --   < > = values in this interval not displayed.  Chemistries   Recent Labs Lab 08/10/15 0022 08/10/15 0030 08/11/15 0230 08/15/15 1056 08/16/15 0954  NA 135 134* 135 134* 135  K 3.9 3.8 3.4* 4.6 4.4  CL 99* 101 102 100* 104  CO2  --  22 23 25  21*  GLUCOSE 178* 183* 173* 200* 214*  BUN 20 15 12  23* 14  CREATININE 0.80 0.89 0.84 0.90 0.83  CALCIUM  --  9.7 8.9 9.1 8.7*    CBG:  Recent Labs Lab 08/15/15 1150 08/15/15 1638 08/15/15 2227 08/16/15 0732 08/16/15 1134  GLUCAP 165* 136* 140* 175* 137*    GFR Estimated Creatinine Clearance: 48.3 mL/min (by C-G formula based on Cr of 0.83).  Coagulation profile  Recent Labs Lab 08/10/15 0030 08/15/15 1521  INR 1.07 1.20    Cardiac Enzymes  Recent Labs Lab 08/10/15 0819 08/10/15 1524 08/10/15 2218  TROPONINI <0.03 <0.03 <0.03    Invalid input(s): POCBNP No results for input(s): DDIMER in the last 72 hours. No results for input(s): HGBA1C in the last 72 hours. No results for input(s): CHOL, HDL, LDLCALC, TRIG, CHOLHDL, LDLDIRECT in the last 72 hours. No results for input(s): TSH, T4TOTAL, T3FREE, THYROIDAB in the  last 72 hours.  Invalid input(s): FREET3 No results for input(s): VITAMINB12, FOLATE, FERRITIN, TIBC, IRON, RETICCTPCT in the last 72 hours. No results for input(s): LIPASE, AMYLASE in the last 72 hours.  Urine Studies No results for input(s): UHGB, CRYS in the last 72 hours.  Invalid input(s): UACOL, UAPR, USPG, UPH, UTP, UGL, UKET, UBIL, UNIT, UROB, ULEU, UEPI, UWBC, URBC, UBAC, CAST, UCOM, BILUA  MICROBIOLOGY: No results found for this or any  previous visit (from the past 240 hour(s)).  RADIOLOGY STUDIES/RESULTS: Ct Abdomen Pelvis Wo Contrast  07/22/2015   CLINICAL DATA:  Abdominal pain with nausea and vomiting.  EXAM: CT ABDOMEN AND PELVIS WITHOUT CONTRAST  TECHNIQUE: Multidetector CT imaging of the abdomen and pelvis was performed following the standard protocol without IV contrast.  COMPARISON:  06/19/2015.  FINDINGS: BODY WALL: Unremarkable.  LOWER CHEST: Unremarkable.  ABDOMEN/PELVIS:  Liver: No focal abnormality.  Biliary: No evidence of biliary obstruction or stone.  Pancreas: Unremarkable.  Spleen: Unremarkable.  Adrenals: Unremarkable.  Kidneys and ureters: No hydronephrosis or stone. Slight perirenal stranding, slightly greater on the RIGHT, is stable.  Bladder: Unremarkable.  Reproductive: Unremarkable.  Bowel: Fecal impaction without frank bowel obstruction. Normal appendix. Sigmoid diverticulosis without diverticulitis.  Retroperitoneum: No solid mass or adenopathy. Stable low-attenuation cystic lesion of the LEFT iliacus, 54 x 67 mm cross-section.  Peritoneum: No free fluid or gas.  Vascular: Multiple curvilinear calcifications in the peripancreatic region, consistent with aneurysms, stable  OSSEOUS: No acute abnormalities.  LEFT hip prosthesis.  IMPRESSION: Fecal impaction. It is unclear if this is contributing to nausea and vomiting, but does represent change from recent CT. Otherwise no acute intra-abdominal or pelvic findings.   Electronically Signed   By: Staci Righter M.D.   On:  07/22/2015 20:09   Ct Head Wo Contrast  08/14/2015   CLINICAL DATA:  Follow up stroke.  RIGHT-sided weakness.  EXAM: CT HEAD WITHOUT CONTRAST  TECHNIQUE: Contiguous axial images were obtained from the base of the skull through the vertex without intravenous contrast.  COMPARISON:  Most recent MR 08/12/2015. Most recent CT head 08/12/2015.  FINDINGS: Increasingly well defined hypoattenuation in the LEFT centrum semiovale representing the area of restricted diffusion on the recent MR. No definite new areas of acute infarction. No hemorrhagic transformation. Baseline atrophy with small vessel disease. Calvarium intact. No sinus or acute mastoid disease. Vascular calcification.  IMPRESSION: Increasingly well-defined hypoattenuation representing acute infarction LEFT MCA territory. No hemorrhagic transformation.   Electronically Signed   By: Staci Righter M.D.   On: 08/14/2015 17:14   Ct Head Wo Contrast  08/12/2015   CLINICAL DATA:  Recent stroke, post tPA with resolution of symptoms, readmitted for new RIGHT-sided weakness, slurred speech and facial droop, hypertension, type II diabetes mellitus, RIGHT breast cancer, hypercholesterolemia  EXAM: CT HEAD WITHOUT CONTRAST  TECHNIQUE: Contiguous axial images were obtained from the base of the skull through the vertex without intravenous contrast.  COMPARISON:  08/10/2015  FINDINGS: Generalized atrophy.  Normal ventricular morphology.  No midline shift or mass effect.  Extensive small vessel chronic ischemic changes of deep cerebral white matter.  No intracranial hemorrhage, mass lesion or evidence acute infarction.  Again identified subacute nonhemorrhagic infarct anterior LEFT basal ganglia.  No extra-axial fluid collections.  Extensive atherosclerotic calcifications of internal carotid and vertebral arteries at skullbase.  Bones and sinuses unremarkable.  IMPRESSION: Atrophy with small vessel chronic ischemic changes of deep cerebral white matter.  Subacute  nonhemorrhagic infarct at LEFT basal ganglia unchanged.  Extensive atherosclerotic calcification.  No new intracranial abnormalities.   Electronically Signed   By: Lavonia Dana M.D.   On: 08/12/2015 08:26   Ct Head Wo Contrast  08/10/2015   CLINICAL DATA:  Code stroke. Right-sided weakness with right facial droop.  EXAM: CT HEAD WITHOUT CONTRAST  TECHNIQUE: Contiguous axial images were obtained from the base of the skull through the vertex without intravenous contrast.  COMPARISON:  Head CT 07/29/2015, brain MRI  07/30/2015  FINDINGS: No intracranial hemorrhage. Expected evolution with minimal developing hypodensity in the left basal ganglia at site of acute infarct on MRI, no associated edema. No evidence territorial infarct or progressive acute ischemia. Background atrophy and chronic small vessel ischemia is stable in the interim. No cerebral edema, extra-axial fluid collection, mass effect or midline shift. Atherosclerosis again seen of the skullbase vasculature. Tiny mucous retention cyst right maxillary sinus. Calvarium is intact.  IMPRESSION: 1. Expected evolution of the left basal ganglia infarct since prior exam. No interval hemorrhage, mass effect or midline shift. 2. Background chronic small vessel ischemia without CT findings of acute territorial infarct.  These results were called by telephone at the time of interpretation on 08/10/2015 at 12:11 am to Dr. Janann Colonel , who verbally acknowledged these results.   Electronically Signed   By: Jeb Levering M.D.   On: 08/10/2015 00:12   Ct Head Wo Contrast  07/29/2015   CLINICAL DATA:  Acute onset right-sided weakness  EXAM: CT HEAD WITHOUT CONTRAST  TECHNIQUE: Contiguous axial images were obtained from the base of the skull through the vertex without intravenous contrast.  COMPARISON:  May 11, 2015  FINDINGS: Mild diffuse atrophy is stable. There is no intracranial mass, hemorrhage, extra-axial fluid collection, or midline shift. There is small vessel  disease throughout the centra semiovale bilaterally. There is small vessel disease in both external capsules, stable. Small vessel disease is also noted in the superior left thalamus. No acute infarct evident. The middle cerebral arteries do not show increased attenuation. There is atherosclerotic calcification in the proximal left middle cerebral artery, a stable finding. Bony calvarium appears intact. The mastoid air cells are clear. There are small retention cysts in the right maxillary antrum.  IMPRESSION: Atrophy with extensive supratentorial small vessel disease, stable. No acute infarct evident. No hemorrhage or mass effect. Calcification proximal left middle cerebral artery is a stable finding. There are retention cysts in the right maxillary antrum.  Critical Value/emergent results were called by telephone at the time of interpretation on 07/29/2015 at 11:18 am to Dr. Nicole Kindred, neurology  , who verbally acknowledged these results.   Electronically Signed   By: Lowella Grip III M.D.   On: 07/29/2015 11:19   Mr Virgel Paling Wo Contrast  08/10/2015   CLINICAL DATA:  81 year old female with recent lacunar infarct in the left hemisphere earlier this month status post tPA. Recurrent right side weakness and facial droop. Initial encounter.  EXAM: MRI HEAD WITHOUT CONTRAST  MRA HEAD WITHOUT CONTRAST  TECHNIQUE: Multiplanar, multiecho pulse sequences of the brain and surrounding structures were obtained without intravenous contrast. Angiographic images of the head were obtained using MRA technique without contrast.  COMPARISON:  Head CT at 0007 hours today. Brain MRI and head and neck MRA 07/30/2015  FINDINGS: MRI HEAD FINDINGS  Linear restricted diffusion extending from the posterior left corona radiata inferiorly toward the putamen and external capsule has faded since 07/30/2015. There is stable curvilinear restricted diffusion in the more anterior corona radiata (series 6, image 18). No new areas of diffusion  restriction. Major intracranial vascular flow voids are stable.  No interval hemorrhage. No intracranial mass effect. No new intracranial signal abnormality. No mass effect, evidence of mass lesion, ventriculomegaly, or extra-axial collection. Cervicomedullary junction and pituitary are within normal limits.  Stable paranasal sinuses, mastoids, visualized internal auditory structures, orbits soft tissues, and scalp soft tissues. Negative visualized cervical spine. Bone marrow signal remains normal.  MRA HEAD FINDINGS  Stable posterior circulation,  high-grade stenosis of the left vertebral artery V4 segment and asymmetrically decreased flow signal at the left PICA origin. Up to moderate proximal basilar artery stenosis. The basilar artery remains patent. SCA and PCAs are stable, mild to moderate left P1 segment stenosis. Posterior communicating arteries are diminutive or absent.  Stable antegrade flow signal in the distal cervical ICAs. Stable ICA siphons with high-grade bilateral supraclinoid segment irregularity. Both ophthalmic artery origins remain patent. Stable carotid termini. MCA and ACA origins remain patent.  Stable visualized bilateral ACA branches. Stable visualized right MCA branches with mild mid M1 stenosis. Left MCA M1 segment and bifurcation irregularity and up to mild stenosis appears stable. Visualized left MCA branches are stable.  IMPRESSION: 1. Expected evolution of the recent left MCA white matter infarct. No interval hemorrhage. No associated mass effect. 2. No new intracranial abnormality. 3. Stable severe intracranial atherosclerosis on MRA. Moderate to high-grade stenoses of the distal left vertebral artery, both distal ICA siphons, and the proximal basilar artery.   Electronically Signed   By: Genevie Ann M.D.   On: 08/10/2015 14:21   Mr Angiogram Neck W Wo Contrast  07/30/2015   CLINICAL DATA:  Awoke from a nap with right-sided weakness. Right facial droop. Treated with tPA. Symptoms began  07/29/2015  EXAM: MRI HEAD WITHOUT AND WITH CONTRAST  MRA HEAD WITHOUT CONTRAST  MRA NECK WITHOUT AND WITH CONTRAST  TECHNIQUE: Multiplanar, multiecho pulse sequences of the brain and surrounding structures were obtained without and with intravenous contrast. Angiographic images of the Circle of Willis were obtained using MRA technique without intravenous contrast. Angiographic images of the neck were obtained using MRA technique without and with intravenous contrast. Carotid stenosis measurements (when applicable) are obtained utilizing NASCET criteria, using the distal internal carotid diameter as the denominator.  CONTRAST:  72mL MULTIHANCE GADOBENATE DIMEGLUMINE 529 MG/ML IV SOLN  COMPARISON:  Head CT 07/29/2015.  MRI 10/19/2003.  FINDINGS: MRI HEAD FINDINGS  Diffusion imaging shows acute infarction within the left basal ganglia and radiating white matter tracts. No large vessel or cortical infarction. No evidence of swelling or hemorrhage.  There chronic small-vessel ischemic changes affecting the pons and cerebellum. The cerebral hemispheres elsewhere show old small vessel infarctions within the thalami, basal ganglia and throughout the cerebral hemispheric white matter. No cortical or large vessel territory infarction. No mass lesion, hemorrhage, hydrocephalus or extra-axial collection. No pituitary mass. No inflammatory sinus disease. No skull or skullbase lesion. After contrast administration, no abnormal enhancement occurs.  MRA HEAD FINDINGS  Both internal carotid arteries are patent through the skullbase. The siphon regions are patent, but there is pronounced atherosclerotic irregularity bilaterally. Maximal stenosis in the siphon regions measures approximately 30%. There is severe stenosis in the supraclinoid internal carotid arteries bilaterally, worse on the left than the right. Stenosis on the left is 80% or greater. Stenosis on the right is 60-70% beyond that, the anterior and middle cerebral  vessels are patent without dominant stenosis. The more distal vessels show narrowing and irregularity consistent with diffuse intracranial atherosclerotic disease.  Both vertebral arteries are patent but show narrowing and irregularity throughout their course consistent with diffuse atherosclerosis. There is no stenosis greater than 50% affecting either distal vertebral artery. The basilar artery shows narrowing and irregularity with a proximal stenosis measured at 50%. Superior cerebellar and posterior cerebral artery vessels are patent, but show diffuse atherosclerotic irregularity and narrowing.  MRA NECK FINDINGS  Branching pattern of the brachiocephalic vessels from the arch is normal with a common  origin of the innominate artery and left common carotid artery. Origin stenosis. Both common carotid arteries are widely patent to the bifurcations. There is mild atherosclerotic disease at both carotid bifurcations but no stenosis. Both cervical internal carotid arteries are widely patent.  Both vertebral arteries are patent with the right being larger than the left. There is 30-50% stenosis at the right vertebral artery origin and at the left vertebral artery origin. Beyond that, the vessels appear widely patent through the cervical region.  IMPRESSION: Acute infarction affecting the left basal ganglia and radiating white matter tracts. No swelling or hemorrhage.  Extensive chronic small vessel ischemic changes elsewhere throughout the brain.  No carotid bifurcation disease. 30-50% stenoses at both vertebral artery origins.  Severe atherosclerotic disease intracranially. Advanced disease in the siphon regions and supraclinoid internal carotid arteries with stenosis of the left supraclinoid internal carotid artery of 80% or greater. Stenosis on the right is 60-70%. Approximately 50% stenoses in the distal vertebral arteries and in the proximal basilar artery.  Diffuse atherosclerotic narrowing and irregularity in  the smaller intracranial branches.   Electronically Signed   By: Nelson Chimes M.D.   On: 07/30/2015 13:51   Mr Brain Wo Contrast  08/12/2015   CLINICAL DATA:  Right upper and right lower extremity weakness, asymmetric smile, rightward tongue deviation. Recent left MCA infarct.  EXAM: MRI HEAD WITHOUT CONTRAST  TECHNIQUE: Multiplanar, multiecho pulse sequences of the brain and surrounding structures were obtained without intravenous contrast.  COMPARISON:  08/10/2015  FINDINGS: There is a 1.5 cm focus of restricted diffusion consistent with acute infarct in the left lateral lenticulostriate artery territory extending from the posterior body of the left caudate to the posterior left lentiform nucleus, predominantly involving the posterior corona radiata. This restricted diffusion is new from the MRI of 2 days ago. It is in the same locate as the infarct on the 07/30/2015 brain MRI, although it is more confluent and mildly larger in size. Residual abnormal diffusion signal more anteriorly in the left corona radiata on the prior MRI has further decreased, without residual restricted diffusion in this location.  Mild T1 hyperintensity and susceptibility artifact in the left putamen, predominantly posteriorly, may reflect subacute petechial blood products related to the recent ischemia. There is no parenchymal hematoma. A focus of chronic hemorrhage is again seen in the white matter of the right frontal lobe. There is no mass, midline shift, or extra-axial fluid collection. Moderate generalized cerebral atrophy and moderate chronic small vessel ischemic change in the cerebral white matter are stable. Small, chronic infarcts are again seen in the bilateral basal ganglia, thalami, deep cerebral white matter, and pons.  Prior bilateral cataract extraction is noted. Tiny right maxillary sinus mucous retention cysts are again noted. Mastoid air cells are clear. Major intracranial vascular flow voids are preserved.   IMPRESSION: 1. 1.5 cm recurrent acute infarct centered in the posterior left corona radiata, new from the MRI of 2 days ago. This is in the same location although is mildly larger than that on the 07/30/2015 MRI. 2. Chronic ischemic changes as above.   Electronically Signed   By: Logan Bores M.D.   On: 08/12/2015 09:21   Mr Brain Wo Contrast  08/10/2015   CLINICAL DATA:  81 year old female with recent lacunar infarct in the left hemisphere earlier this month status post tPA. Recurrent right side weakness and facial droop. Initial encounter.  EXAM: MRI HEAD WITHOUT CONTRAST  MRA HEAD WITHOUT CONTRAST  TECHNIQUE: Multiplanar, multiecho pulse sequences  of the brain and surrounding structures were obtained without intravenous contrast. Angiographic images of the head were obtained using MRA technique without contrast.  COMPARISON:  Head CT at 0007 hours today. Brain MRI and head and neck MRA 07/30/2015  FINDINGS: MRI HEAD FINDINGS  Linear restricted diffusion extending from the posterior left corona radiata inferiorly toward the putamen and external capsule has faded since 07/30/2015. There is stable curvilinear restricted diffusion in the more anterior corona radiata (series 6, image 18). No new areas of diffusion restriction. Major intracranial vascular flow voids are stable.  No interval hemorrhage. No intracranial mass effect. No new intracranial signal abnormality. No mass effect, evidence of mass lesion, ventriculomegaly, or extra-axial collection. Cervicomedullary junction and pituitary are within normal limits.  Stable paranasal sinuses, mastoids, visualized internal auditory structures, orbits soft tissues, and scalp soft tissues. Negative visualized cervical spine. Bone marrow signal remains normal.  MRA HEAD FINDINGS  Stable posterior circulation, high-grade stenosis of the left vertebral artery V4 segment and asymmetrically decreased flow signal at the left PICA origin. Up to moderate proximal basilar  artery stenosis. The basilar artery remains patent. SCA and PCAs are stable, mild to moderate left P1 segment stenosis. Posterior communicating arteries are diminutive or absent.  Stable antegrade flow signal in the distal cervical ICAs. Stable ICA siphons with high-grade bilateral supraclinoid segment irregularity. Both ophthalmic artery origins remain patent. Stable carotid termini. MCA and ACA origins remain patent.  Stable visualized bilateral ACA branches. Stable visualized right MCA branches with mild mid M1 stenosis. Left MCA M1 segment and bifurcation irregularity and up to mild stenosis appears stable. Visualized left MCA branches are stable.  IMPRESSION: 1. Expected evolution of the recent left MCA white matter infarct. No interval hemorrhage. No associated mass effect. 2. No new intracranial abnormality. 3. Stable severe intracranial atherosclerosis on MRA. Moderate to high-grade stenoses of the distal left vertebral artery, both distal ICA siphons, and the proximal basilar artery.   Electronically Signed   By: Genevie Ann M.D.   On: 08/10/2015 14:21   Mr Jeri Cos VZ Contrast  07/30/2015   CLINICAL DATA:  Awoke from a nap with right-sided weakness. Right facial droop. Treated with tPA. Symptoms began 07/29/2015  EXAM: MRI HEAD WITHOUT AND WITH CONTRAST  MRA HEAD WITHOUT CONTRAST  MRA NECK WITHOUT AND WITH CONTRAST  TECHNIQUE: Multiplanar, multiecho pulse sequences of the brain and surrounding structures were obtained without and with intravenous contrast. Angiographic images of the Circle of Willis were obtained using MRA technique without intravenous contrast. Angiographic images of the neck were obtained using MRA technique without and with intravenous contrast. Carotid stenosis measurements (when applicable) are obtained utilizing NASCET criteria, using the distal internal carotid diameter as the denominator.  CONTRAST:  70mL MULTIHANCE GADOBENATE DIMEGLUMINE 529 MG/ML IV SOLN  COMPARISON:  Head CT  07/29/2015.  MRI 10/19/2003.  FINDINGS: MRI HEAD FINDINGS  Diffusion imaging shows acute infarction within the left basal ganglia and radiating white matter tracts. No large vessel or cortical infarction. No evidence of swelling or hemorrhage.  There chronic small-vessel ischemic changes affecting the pons and cerebellum. The cerebral hemispheres elsewhere show old small vessel infarctions within the thalami, basal ganglia and throughout the cerebral hemispheric white matter. No cortical or large vessel territory infarction. No mass lesion, hemorrhage, hydrocephalus or extra-axial collection. No pituitary mass. No inflammatory sinus disease. No skull or skullbase lesion. After contrast administration, no abnormal enhancement occurs.  MRA HEAD FINDINGS  Both internal carotid arteries are patent through the  skullbase. The siphon regions are patent, but there is pronounced atherosclerotic irregularity bilaterally. Maximal stenosis in the siphon regions measures approximately 30%. There is severe stenosis in the supraclinoid internal carotid arteries bilaterally, worse on the left than the right. Stenosis on the left is 80% or greater. Stenosis on the right is 60-70% beyond that, the anterior and middle cerebral vessels are patent without dominant stenosis. The more distal vessels show narrowing and irregularity consistent with diffuse intracranial atherosclerotic disease.  Both vertebral arteries are patent but show narrowing and irregularity throughout their course consistent with diffuse atherosclerosis. There is no stenosis greater than 50% affecting either distal vertebral artery. The basilar artery shows narrowing and irregularity with a proximal stenosis measured at 50%. Superior cerebellar and posterior cerebral artery vessels are patent, but show diffuse atherosclerotic irregularity and narrowing.  MRA NECK FINDINGS  Branching pattern of the brachiocephalic vessels from the arch is normal with a common origin  of the innominate artery and left common carotid artery. Origin stenosis. Both common carotid arteries are widely patent to the bifurcations. There is mild atherosclerotic disease at both carotid bifurcations but no stenosis. Both cervical internal carotid arteries are widely patent.  Both vertebral arteries are patent with the right being larger than the left. There is 30-50% stenosis at the right vertebral artery origin and at the left vertebral artery origin. Beyond that, the vessels appear widely patent through the cervical region.  IMPRESSION: Acute infarction affecting the left basal ganglia and radiating white matter tracts. No swelling or hemorrhage.  Extensive chronic small vessel ischemic changes elsewhere throughout the brain.  No carotid bifurcation disease. 30-50% stenoses at both vertebral artery origins.  Severe atherosclerotic disease intracranially. Advanced disease in the siphon regions and supraclinoid internal carotid arteries with stenosis of the left supraclinoid internal carotid artery of 80% or greater. Stenosis on the right is 60-70%. Approximately 50% stenoses in the distal vertebral arteries and in the proximal basilar artery.  Diffuse atherosclerotic narrowing and irregularity in the smaller intracranial branches.   Electronically Signed   By: Nelson Chimes M.D.   On: 07/30/2015 13:51   Dg Chest Portable 1 View  08/10/2015   CLINICAL DATA:  Weakness.  EXAM: PORTABLE CHEST 1 VIEW  COMPARISON:  07/29/2015  FINDINGS: Lung volumes are low. The cardiomediastinal contours are unchanged. Pulmonary vasculature is normal. No consolidation, pleural effusion, or pneumothorax. No acute osseous abnormalities are seen. Remote left rib fracture is unchanged.  IMPRESSION: No acute pulmonary process.   Electronically Signed   By: Jeb Levering M.D.   On: 08/10/2015 02:27   Dg Chest Port 1 View  07/29/2015   CLINICAL DATA:  Code stroke, elevated white blood count  EXAM: PORTABLE CHEST - 1 VIEW   COMPARISON:  07/22/2015  FINDINGS: Normal heart size and vascular pattern. Stable aortic calcification. Nonacute appearing left sixth rib fracture. Minimal bibasilar atelectasis.  IMPRESSION: No active disease.   Electronically Signed   By: Skipper Cliche M.D.   On: 07/29/2015 12:35   Dg Abd Acute W/chest  07/22/2015   CLINICAL DATA:  Nausea and vomiting for 2 days, recurrent urinary tract infection  EXAM: DG ABDOMEN ACUTE W/ 1V CHEST  COMPARISON:  None.  FINDINGS: Mild cardiac enlargement. Calcification of the aorta. Lungs clear except for mild left base atelectasis. No free air. No abnormally dilated loops of bowel. Gas and stool into the rectum. No abnormal opacities.  IMPRESSION: No acute abnormalities.   Electronically Signed   By: Skipper Cliche  M.D.   On: 07/22/2015 16:22   Mr Jodene Nam Head/brain Wo Cm  07/30/2015   CLINICAL DATA:  Awoke from a nap with right-sided weakness. Right facial droop. Treated with tPA. Symptoms began 07/29/2015  EXAM: MRI HEAD WITHOUT AND WITH CONTRAST  MRA HEAD WITHOUT CONTRAST  MRA NECK WITHOUT AND WITH CONTRAST  TECHNIQUE: Multiplanar, multiecho pulse sequences of the brain and surrounding structures were obtained without and with intravenous contrast. Angiographic images of the Circle of Willis were obtained using MRA technique without intravenous contrast. Angiographic images of the neck were obtained using MRA technique without and with intravenous contrast. Carotid stenosis measurements (when applicable) are obtained utilizing NASCET criteria, using the distal internal carotid diameter as the denominator.  CONTRAST:  17mL MULTIHANCE GADOBENATE DIMEGLUMINE 529 MG/ML IV SOLN  COMPARISON:  Head CT 07/29/2015.  MRI 10/19/2003.  FINDINGS: MRI HEAD FINDINGS  Diffusion imaging shows acute infarction within the left basal ganglia and radiating white matter tracts. No large vessel or cortical infarction. No evidence of swelling or hemorrhage.  There chronic small-vessel ischemic  changes affecting the pons and cerebellum. The cerebral hemispheres elsewhere show old small vessel infarctions within the thalami, basal ganglia and throughout the cerebral hemispheric white matter. No cortical or large vessel territory infarction. No mass lesion, hemorrhage, hydrocephalus or extra-axial collection. No pituitary mass. No inflammatory sinus disease. No skull or skullbase lesion. After contrast administration, no abnormal enhancement occurs.  MRA HEAD FINDINGS  Both internal carotid arteries are patent through the skullbase. The siphon regions are patent, but there is pronounced atherosclerotic irregularity bilaterally. Maximal stenosis in the siphon regions measures approximately 30%. There is severe stenosis in the supraclinoid internal carotid arteries bilaterally, worse on the left than the right. Stenosis on the left is 80% or greater. Stenosis on the right is 60-70% beyond that, the anterior and middle cerebral vessels are patent without dominant stenosis. The more distal vessels show narrowing and irregularity consistent with diffuse intracranial atherosclerotic disease.  Both vertebral arteries are patent but show narrowing and irregularity throughout their course consistent with diffuse atherosclerosis. There is no stenosis greater than 50% affecting either distal vertebral artery. The basilar artery shows narrowing and irregularity with a proximal stenosis measured at 50%. Superior cerebellar and posterior cerebral artery vessels are patent, but show diffuse atherosclerotic irregularity and narrowing.  MRA NECK FINDINGS  Branching pattern of the brachiocephalic vessels from the arch is normal with a common origin of the innominate artery and left common carotid artery. Origin stenosis. Both common carotid arteries are widely patent to the bifurcations. There is mild atherosclerotic disease at both carotid bifurcations but no stenosis. Both cervical internal carotid arteries are widely  patent.  Both vertebral arteries are patent with the right being larger than the left. There is 30-50% stenosis at the right vertebral artery origin and at the left vertebral artery origin. Beyond that, the vessels appear widely patent through the cervical region.  IMPRESSION: Acute infarction affecting the left basal ganglia and radiating white matter tracts. No swelling or hemorrhage.  Extensive chronic small vessel ischemic changes elsewhere throughout the brain.  No carotid bifurcation disease. 30-50% stenoses at both vertebral artery origins.  Severe atherosclerotic disease intracranially. Advanced disease in the siphon regions and supraclinoid internal carotid arteries with stenosis of the left supraclinoid internal carotid artery of 80% or greater. Stenosis on the right is 60-70%. Approximately 50% stenoses in the distal vertebral arteries and in the proximal basilar artery.  Diffuse atherosclerotic narrowing and irregularity in the  smaller intracranial branches.   Electronically Signed   By: Nelson Chimes M.D.   On: 07/30/2015 13:51    Oren Binet, MD  [facility] Pager:[phone]  If 7PM-7AM, please contact night-coverage [email or web address] Password [email or web address] 08/16/2015, 2:45 PM   LOS: 6 days

## 2015-08-16 NOTE — Progress Notes (Signed)
STROKE TEAM PROGRESS NOTE   SUBJECTIVE (INTERVAL HISTORY) Daughter at bedside. Nursing students and instructor also present. Report stools still bloody, dark red this am.    OBJECTIVE Temp:  [97.8 F (36.6 C)-98.2 F (36.8 C)] 97.8 F (36.6 C) (10/04 0550) Pulse Rate:  [64-67] 64 (10/04 0550) Cardiac Rhythm:  [-] Normal sinus rhythm;Bundle branch block (10/04 0824) Resp:  [20] 20 (10/04 0550) BP: (188-208)/(43-59) 195/56 mmHg (10/04 0550) SpO2:  [94 %-98 %] 98 % (10/04 0550)  CBC:   Recent Labs Lab 08/10/15 0030  08/15/15 2212 08/16/15 0715  WBC 26.7*  < > 22.2* 21.4*  NEUTROABS 9.3*  --   --   --   HGB 13.5  < > 11.2* 12.0  HCT 41.4  < > 35.0* 35.7*  MCV 92.0  < > 92.8 91.1  PLT 278  < > 278 275  < > = values in this interval not displayed.  Basic Metabolic Panel:   Recent Labs Lab 08/11/15 0230 08/15/15 1056  NA 135 134*  K 3.4* 4.6  CL 102 100*  CO2 23 25  GLUCOSE 173* 200*  BUN 12 23*  CREATININE 0.84 0.90  CALCIUM 8.9 9.1    Lipid Panel:     Component Value Date/Time   CHOL 186 07/30/2015 0239   TRIG 182* 07/30/2015 0239   HDL 36* 07/30/2015 0239   CHOLHDL 5.2 07/30/2015 0239   VLDL 36 07/30/2015 0239   LDLCALC 114* 07/30/2015 0239   HgbA1c:  Lab Results  Component Value Date   HGBA1C 7.8* 07/30/2015   Urine Drug Screen:     Component Value Date/Time   LABOPIA NONE DETECTED 08/10/2015 0106   COCAINSCRNUR NONE DETECTED 08/10/2015 0106   LABBENZ NONE DETECTED 08/10/2015 0106   AMPHETMU NONE DETECTED 08/10/2015 0106   THCU NONE DETECTED 08/10/2015 0106   LABBARB NONE DETECTED 08/10/2015 0106      PHYSICAL EXAM General - Well nourished, well developed, in no apparent distress.  Ophthalmologic - Fundi not visualized due to eye movement.  Cardiovascular - Regular rhythm with no murmur.  Mental Status -  Level of arousal and orientation to time, place, and person were intact. Language including expression, naming, repetition,  comprehension was assessed and found intact. Fund of Knowledge was assessed and was intact.  Cranial Nerves II - XII - II - Visual field intact OU. III, IV, VI - Extraocular movements intact. V - Facial sensation intact bilaterally. VII - right facial droop,  . VIII - Hearing & vestibular intact bilaterally. X - Palate elevates symmetrically. XI - Chin turning & shoulder shrug intact bilaterally. XII - Tongue protrusion intact.  Motor Strength - The patient's strength was 0/5 RUE and 1/5 RLE.  LUE and LLE 5/5/. Bulk was normal and fasciculations were absent.   Motor Tone - Muscle tone was assessed at the neck and appendages and was normal.  Reflexes - The patient's reflexes were 1+ in all extremities and she had no pathological reflexes.  Sensory - Light touch, temperature/pinprick were assessed and were symmetrical.    Coordination - The patient had normal movements in the hands and feet with no ataxia or dysmetria.    Gait and Station - deferred due to high BP.   ASSESSMENT/PLAN [name] is a 81 y.o. female with history of HTN, HLD, DM, right breast cancer with stable mets to bones and soft tissue, and recent left CR stroke treated with TPA on 07/29/15 presenting with acute onset of right-sided  weakness and slurred speech. She did not receive IV t-PA due to patient and family decision not to treat. Symptoms improved.  Recurrent left coronal radiation infarct - due to left terminal ICA high grade stenosis in the setting of labile BP fluctuation, with additional recurrent stroke in hospital  Resultant  Right facial drop and right mild hemiparesis  MRI new recurrent left CR infarct   MRA head stable high grade stenosis on the bilateral ICA siphone, L>R  MRA neck 07/30/2015 revealed no significant disease  2D Echo 07/30/2015 - EF 65-70%. No cardiac source of emboli identified.  LDL 07/30/2015 - 114  HgbA1c 07/30/2015 - 7.8  VTE prophylaxis - SCDs Diet clear  liquid Room service appropriate?: Yes; Fluid consistency:: Thin  aspirin 81 mg orally every day and clopidogrel 75 mg orally every day prior to admission, now on no antithrombotic due to GIB  Therapy recommendations:SNF. Pt and daughter now agreeable to consider  Disposition: Pending  B/L intracranial ICA stenosis  MRA showed left > 80% stenosis and right 60% stenosis  In the setting of labile BP, it is likely the cause of recurrent left MCA infarct  Due to recurrent left MCA infarcts, may consider left MCA stenting-discussed with patient and daughter 08/13/15 but they are reluctant at the present time and would like her to recover in rehabilitation prior to considering revascularization  However, due to her advanced age and metastasis breast Ca, she is not a good candidate   Dual antiplatelets recommended for IC stenosis, however, GIB precludes  Labile BP control  Restarted BP meds about 36 hours post event  Resumed amlodipine and losartan and atenolol at half dose. Did not resume clonidine  Then pt had BP in 140s at night with worsening symptoms and confirmed additional recurrent stroke during hospitalization  BP goal 150-160  BP continued to elevate with SBP 190-200s  Will adjust pt's BP meds to home regimen as prior to admission  History of left MCA territory infarct 2 weeks ago  S/p tPA at that time  Most likely due to left terminal ICA high grade stenosis in the setting of hypotension  No significant residue on discharge   Discharged on ASA and plavix and zetia  Discharged with home health  Anxiety   Pt very anxious about her condition, about going home, about her BP, about her medications  on clonazepam bid - sedated after yesterday's dose. Decreased by 50% bid  Hyperlipidemia  Home meds:  Zetia resumed in hospital  LDL 114, goal < 70  Lipitor intolerance  On Zetia, continue Zetia on discharge.  Diabetes  Home meds - lantus  HgbA1c 7.8, goal <  7.0  Uncontrolled  SSI  Resume lantus once on diet.  Dehydration  Add IVF, bolus with 500 cc  Check BMET  Subacute Lower GI Bleeding Hemorrhoids  Stopped aspirin and plavix  CBC stable  GI consult - on Miralax, PPI. ok to resume either aspirin or plavix once stool clears. Continue Miralax  Other Stroke Risk Factors  Advanced age  Obesity, Body mass index is 31.96 kg/(m^2).   Hx stroke/TIA  Other Active Problems  Contrast allergy   Right breast cancer with stable bone, blood and soft tissue metastasis  Leukocytosis - elevated but stable - due to metastasis breast cancer  Hospital day # [place] for Pager information 08/16/2015 9:41 AM  I have personally examined this patient, reviewed notes, independently viewed imaging studies, participated in medical  decision making and plan of care. I have made any additions or clarifications directly to the above note. Agree with note above. I had a long discussion with the patient's daughter at the bedside regarding her prognosis as well as ongoing medical problems. I also spoke to her oncologist Dr. Benay Spice as well as Dr. Mamie Laurel medical hospitalist and discussed her care. Plan to hold antiplatelets for 1 week and then to start aspirin alone after that if stable  Antony Contras, [place] Pager: [phone] 08/16/2015 1:45 PM    To contact Stroke Continuity provider, please refer to [email or web address] After hours, contact General Neurology

## 2015-08-16 NOTE — Progress Notes (Signed)
PT Cancellation Note  Patient Details Name: Molly [name] MRN: [id number] DOB: 03/02/1923   Cancelled Treatment:    Reason Eval/Treat Not Completed: Other (comment). Pt family/friends in room declined PT services at this time as they are in the process of most likely transitioning to palliative care/hospice. Will further discuss with daughter upon her return to discuss her and the patient's wishes regarding therapy.   Kingsley Callander 08/16/2015, 2:02 PM   Kittie Plater, PT, DPT Pager #: [phone] Office #: [phone]

## 2015-08-16 NOTE — Progress Notes (Signed)
[facility] PROGRESS NOTE Subjective No pain small amt of dark stool during night  Objective: Vital signs in last 24 hours: Temp:  [97.7 F (36.5 C)-98.2 F (36.8 C)] 97.8 F (36.6 C) (10/04 0550) Pulse Rate:  [64-69] 64 (10/04 0550) Resp:  [19-20] 20 (10/04 0550) BP: (172-208)/(43-60) 195/56 mmHg (10/04 0550) SpO2:  [94 %-100 %] 98 % (10/04 0550) Last BM Date: 08/14/15  Intake/Output from previous day: 10/03 0701 - 10/04 0700 In: 720 [P.O.:120; I.V.:600] Out: 700 [Urine:700] Intake/Output this shift: Total I/O In: 90 [P.O.:90] Out: -   PE: General--still with slurred speech  Abdomen--soft nontender  Lab Results:  Recent Labs  08/15/15 1056 08/15/15 1521 08/15/15 2212 08/16/15 0715  WBC 20.5* 22.9* 22.2* PENDING  HGB 12.4 11.3* 11.2* 12.0  HCT 38.3 35.6* 35.0* 35.7*  PLT 294 295 278 PENDING   BMET  Recent Labs  08/15/15 1056  NA 134*  K 4.6  CL 100*  CO2 25  CREATININE 0.90   LFT No results for input(s): PROT, AST, ALT, ALKPHOS, BILITOT, BILIDIR, IBILI in the last 72 hours. PT/INR  Recent Labs  08/15/15 1521  LABPROT 15.4*  INR 1.20   PANCREAS No results for input(s): LIPASE in the last 72 hours.       Studies/Results: Ct Head Wo Contrast  08/14/2015   CLINICAL DATA:  Follow up stroke.  RIGHT-sided weakness.  EXAM: CT HEAD WITHOUT CONTRAST  TECHNIQUE: Contiguous axial images were obtained from the base of the skull through the vertex without intravenous contrast.  COMPARISON:  Most recent MR 08/12/2015. Most recent CT head 08/12/2015.  FINDINGS: Increasingly well defined hypoattenuation in the LEFT centrum semiovale representing the area of restricted diffusion on the recent MR. No definite new areas of acute infarction. No hemorrhagic transformation. Baseline atrophy with small vessel disease. Calvarium intact. No sinus or acute mastoid disease. Vascular calcification.  IMPRESSION: Increasingly well-defined hypoattenuation  representing acute infarction LEFT MCA territory. No hemorrhagic transformation.   Electronically Signed   By: Staci Righter M.D.   On: 08/14/2015 17:14    Medications: I have reviewed the patient's current medications.  Assessment/Plan: 1. LGI bleed. Minimal worsened by ASA and plavix. Would continue the miralax and when stool clears, would restart either ASA or plavix but not both and continue on chronic miralax to keep the stools soft.   Giulianna Rocha JR,Dabid Godown L 08/16/2015, 8:25 AM  Pager: [phone] If no answer or after hours call [phone]

## 2015-08-16 NOTE — Clinical Social Work Note (Signed)
Clinical Social Worker met with patient's dtr and friend (at pt's dtr request) in reference to SNF placement with palliative care. Pt's dtr reported she has had a lengthy conversation with the attending and believes that pt is unable to participate in rehab.   Pt's dtr asked several questions surrounding hospice settings (SNF with hospice, Residential Hospice and Inpatient Hospice at [name]. CSW explained hospice settings and pt's dtr expressed understanding. Pt's dtr mentioned that she from conversation with MD she believes pt's prognosis is a few weeks and that the focus would be comfort. CSW will await further instruction from MD before making residential hospice referral. Pt's dtr overwhelmed during meeting however expressed that she appreciated social work intervention and was relieved to have a friend by her side.   Patient is a DNR.  CSW will continue to follow patient and pt's family for continued support and to facilitate pt's discharge needs once medically stable for transfer.   Glendon Axe, MSW, LCSWA [phone] 08/16/2015 1:55 PM

## 2015-08-16 NOTE — Progress Notes (Signed)
Pt currently not at a level to obtain insurance approval or admit pt to inpt rehab. I will continue to follow her progress but pt will likely need SNF level rehab unless she makes significant progress before d/c. [phone]

## 2015-08-16 NOTE — Clinical Social Work Note (Signed)
Clinical Social Worker has contacted pt's dtr, Lynita Lombard to present bed offers. Pt's dtr is hopeful for IP REHAB. In the event that insurance does NOT approved CIR, patient's dtr is strongly interested in [facility]. [facility] currently does NOT have any female beds available. CSW encouraged pt's dtr to choose a second facility in the event that insurance does not approve CIR and [facility] continues to not have female bed available.  CSW will continue to follow pt and pt's family for continued support and to facilitate pt's discharge needs once medically stable.  Glendon Axe, MSW, LCSWA [phone] 08/16/2015 11:33 AM

## 2015-08-16 NOTE — Progress Notes (Signed)
Noted plans as outlined by MDs. I will sign off for now for their is no role for intense inpt rehab at this time. [phone]

## 2015-08-17 LAB — GLUCOSE, CAPILLARY
GLUCOSE-CAPILLARY: 148 mg/dL — AB (ref 65–99)
GLUCOSE-CAPILLARY: 160 mg/dL — AB (ref 65–99)
Glucose-Capillary: 158 mg/dL — ABNORMAL HIGH (ref 65–99)
Glucose-Capillary: 131 mg/dL — ABNORMAL HIGH (ref 65–99)

## 2015-08-17 LAB — CBC
HCT: 33.8 % — ABNORMAL LOW (ref 36.0–46.0)
HEMOGLOBIN: 10.8 g/dL — AB (ref 12.0–15.0)
MCH: 29.7 pg (ref 26.0–34.0)
MCHC: 32 g/dL (ref 30.0–36.0)
MCV: 92.9 fL (ref 78.0–100.0)
PLATELETS: 254 10*3/uL (ref 150–400)
RBC: 3.64 MIL/uL — ABNORMAL LOW (ref 3.87–5.11)
RDW: 13.6 % (ref 11.5–15.5)
WBC: 19.9 10*3/uL — ABNORMAL HIGH (ref 4.0–10.5)

## 2015-08-17 MED ORDER — CLONAZEPAM 0.5 MG PO TABS: 0.2500 mg | ORAL_TABLET | Freq: Two times a day (BID) | ORAL | Status: DC | PRN

## 2015-08-17 NOTE — Progress Notes (Signed)
Speech Language Pathology Treatment: Dysphagia;Cognitive-Linquistic  Patient Details Name: Molly [name] MRN: [id number] DOB: 04/13/1923 Today's Date: 08/17/2015 Time: 4818-5631 SLP Time Calculation (min) (ACUTE ONLY): 33 min  Assessment / Plan / Recommendation Clinical Impression  SLP provided skilled observation during medication administration (whole in puree) and consumption of thin liquids. Pt now on clear liquids secondary to GI issues. Pt had reduced posterior transit of larger pill x1, but with Min A for additional puree and thin liquid boluses, she was able to clear it. Pt required Mod verbal cues for use of speech intelligibility strategies with spontaneous speech output. SLP started therapeutic exercises with minimal pairs, however pt became quickly fatigued from medications and was no longer alert enough for participation. SLP reviewed exercises with daughter. Will continue to follow.   HPI Other Pertinent Information: 81 year old female with recent admission and discharge for acute infarction of left basal ganglia on 9/16 status post TPA with complete resolution of right-sided hemiparesis. Re-admitted 9/28 wiht right sided weakness and facial droop. CT head: Expected evolution of the left basal ganglia infarct since prior   Pertinent Vitals Pain Assessment: No/denies pain  SLP Plan  Continue with current plan of care    Recommendations Diet recommendations: Thin liquid (per MD, may advance to Dys 3 as medically able) Liquids provided via: Cup;Straw Medication Administration: Whole meds with puree Supervision: Full supervision/cueing for compensatory strategies Compensations: Slow rate;Small sips/bites;Check for pocketing;Check for anterior loss Postural Changes and/or Swallow Maneuvers: Seated upright 90 degrees       Oral Care Recommendations: Oral care BID Follow up Recommendations: Skilled Nursing facility Plan: Continue with current plan of care    Germain Osgood,  M.A. CCC-SLP [phone]  Germain Osgood 08/17/2015, 12:12 PM

## 2015-08-17 NOTE — Progress Notes (Signed)
STROKE TEAM PROGRESS NOTE   SUBJECTIVE (INTERVAL HISTORY) Daughter at bedside. States bleeding is improving. Dr. Ammie Dalton visited this am. Plans are for rehab.   OBJECTIVE Temp:  [97.7 F (36.5 C)-98.6 F (37 C)] 97.9 F (36.6 C) (10/05 0617) Pulse Rate:  [59-70] 70 (10/05 0617) Cardiac Rhythm:  [-] Normal sinus rhythm;Bundle branch block (10/05 0700) Resp:  [18-20] 18 (10/05 0617) BP: (145-188)/(50-64) 188/57 mmHg (10/05 0617) SpO2:  [98 %-100 %] 98 % (10/05 0617)  CBC:   Recent Labs Lab 08/16/15 1427 08/17/15 0220  WBC 25.8* 19.9*  HGB 11.2* 10.8*  HCT 34.0* 33.8*  MCV 91.9 92.9  PLT 284 269    Basic Metabolic Panel:   Recent Labs Lab 08/15/15 1056 08/16/15 0954  NA 134* 135  K 4.6 4.4  CL 100* 104  CO2 25 21*  GLUCOSE 200* 214*  BUN 23* 14  CREATININE 0.90 0.83  CALCIUM 9.1 8.7*    PHYSICAL EXAM General - Well nourished, well developed, in no apparent distress.  Ophthalmologic - Fundi not visualized due to eye movement.  Cardiovascular - Regular rhythm with no murmur.  Mental Status -  Level of arousal and orientation to time, place, and person were intact. Language including expression, naming, repetition, comprehension was assessed and found intact. Fund of Knowledge was assessed and was intact.  Cranial Nerves II - XII - II - Visual field intact OU. III, IV, VI - Extraocular movements intact. V - Facial sensation intact bilaterally. VII - right facial droop,  . VIII - Hearing & vestibular intact bilaterally. X - Palate elevates symmetrically. XI - Chin turning & shoulder shrug intact bilaterally. XII - Tongue protrusion intact.  Motor Strength - The patient's strength was 0/5 RUE and 1/5 RLE.  LUE and LLE 5/5/. Bulk was normal and fasciculations were absent.   Motor Tone - Muscle tone was assessed at the neck and appendages and was normal.  Reflexes - The patient's reflexes were 1+ in all extremities and she had no pathological  reflexes.  Sensory - Light touch, temperature/pinprick were assessed and were symmetrical.    Coordination - The patient had normal movements in the hands and feet with no ataxia or dysmetria.    Gait and Station - deferred due to high BP.   ASSESSMENT/PLAN Molly [name] is a 81 y.o. female with history of HTN, HLD, DM, right breast cancer with stable mets to bones and soft tissue, and recent left CR stroke treated with TPA on 07/29/15 presenting with acute onset of right-sided weakness and slurred speech. She did not receive IV t-PA due to patient and family decision not to treat. Symptoms improved.  Recurrent left coronal radiation infarct - due to left terminal ICA high grade stenosis in the setting of labile BP fluctuation, with additional recurrent stroke in hospital  Resultant  Right facial drop and right mild hemiparesis  MRI new recurrent left CR infarct   MRA head stable high grade stenosis on the bilateral ICA siphon, L>R  MRA neck 07/30/2015 revealed no significant disease  2D Echo 07/30/2015 - EF 65-70%. No cardiac source of emboli identified.  LDL 07/30/2015 - 114  HgbA1c 07/30/2015 - 7.8  VTE prophylaxis - SCDs Diet clear liquid Room service appropriate?: Yes; Fluid consistency:: Thin  aspirin 81 mg orally every day and clopidogrel 75 mg orally every day prior to admission, now on no antithrombotic due to GIB  Therapy recommendations:SNF. Pt and daughter now agreeable to consider. Pt unable to tolerate  3h therapy a day for CIR  Disposition: Pending. Marjorie Smolder, SW alerted pt medically ready for transition to next LOC  B/L intracranial ICA stenosis  MRA showed left > 80% stenosis and right 60% stenosis  In the setting of labile BP, it is likely the cause of recurrent left MCA infarct  Due to recurrent left MCA infarcts, may consider left MCA stenting-discussed with patient and daughter 08/13/15 but they are reluctant at the present time and would like her to  recover in rehabilitation prior to considering revascularization  However, due to her advanced age and metastasis breast Ca, she is not a good candidate   Dual antiplatelets recommended for IC stenosis, however, GIB precludes  Labile BP control  BP improving but still high  Back on  home regimen minus spironlactone. Will monitor as she begins additional meds today proir to further adjustment.   History of left MCA territory infarct 2 weeks ago  S/p tPA at that time  Most likely due to left terminal ICA high grade stenosis in the setting of hypotension  No significant residue on discharge   Discharged on ASA and plavix and zetia  Discharged with home health  Anxiety   Pt very anxious about her condition, about going home, about her BP, about her medications  on clonazepam bid - sedated after yesterday's dose. Decreased by 50% bid  Hyperlipidemia  Home meds:  Zetia resumed in hospital  LDL 114, goal < 70  Lipitor intolerance  On Zetia, continue Zetia on discharge.  Diabetes  Home meds - lantus  HgbA1c 7.8, goal < 7.0  Uncontrolled  SSI  Resume lantus once on diet.  Dehydration  Add IVF, bolus with 500 cc  Check BMET  Subacute Lower GI Bleeding Hemorrhoids  Stopped aspirin and plavix  CBC stable  GI consult - on Miralax, PPI. ok to resume either aspirin or plavix once stool clears. Continue Miralax  Rectal bleeding resolving  Other Stroke Risk Factors  Advanced age  Obesity, Body mass index is 31.96 kg/(m^2).   Hx stroke/TIA  Other Active Problems  Contrast allergy   Right breast cancer with stable bone, blood and soft tissue metastasis (life expectancy can be many years with this per Dr. Ammie Dalton. Not imminently an issue. Can consider palliative care in the future should she deteriorate)  Leukocytosis - elevated but stable - due to metastasis breast cancer  Hospital day # [name] for Pager  information 08/17/2015 10:05 AM  I have personally examined this patient, reviewed notes, independently viewed imaging studies, participated in medical decision making and plan of care. I have made any additions or clarifications directly to the above note. I had a long discussion with the patient and daughter with regards to her impending transfer to skilled nursing facility for rehabilitation. She appears reluctant to do so but if she remains medically stable without further bleeding in my opinion she should transfer in the next few days. I recommend changing Klonopin dose to when necessary to reduce sedation and improve her cooperation with therapy  Antony Contras, [place] Pager: [phone] 08/17/2015 3:13 PM   To contact Stroke Continuity provider, please refer to [email or web address] After hours, contact General Neurology

## 2015-08-17 NOTE — Progress Notes (Signed)
[facility] PROGRESS NOTE Subjective patient without gross bleeding. Denies abdominal pain  Objective: Vital signs in last 24 hours: Temp:  [97.7 F (36.5 C)-98.6 F (37 C)] 98.6 F (37 C) (10/05 1033) Pulse Rate:  [59-70] 67 (10/05 1033) Resp:  [15-20] 15 (10/05 1033) BP: (145-205)/(50-63) 205/63 mmHg (10/05 1033) SpO2:  [98 %-100 %] 100 % (10/05 1033) Last BM Date: 08/14/15  Intake/Output from previous day: 10/04 0701 - 10/05 0700 In: 1821.3 [P.O.:90; I.V.:1731.3] Out: -  Intake/Output this shift: Total I/O In: 20 [P.O.:20] Out: -   PE:  Abdomen-- soft and nontender  Lab Results:  Recent Labs  08/15/15 2212 08/16/15 0715 08/16/15 0954 08/16/15 1427 08/17/15 0220  WBC 22.2* 21.4* 19.5* 25.8* 19.9*  HGB 11.2* 12.0 12.2 11.2* 10.8*  HCT 35.0* 35.7* 36.3 34.0* 33.8*  PLT 278 275 268 284 254   BMET  Recent Labs  08/15/15 1056 08/16/15 0954  NA 134* 135  K 4.6 4.4  CL 100* 104  CO2 25 21*  CREATININE 0.90 0.83   LFT No results for input(s): PROT, AST, ALT, ALKPHOS, BILITOT, BILIDIR, IBILI in the last 72 hours. PT/INR  Recent Labs  08/15/15 1521  LABPROT 15.4*  INR 1.20   PANCREAS No results for input(s): LIPASE in the last 72 hours.       Studies/Results: No results found.  Medications: I have reviewed the patient's current medications.  Assessment/Plan: 1. Rectal bleeding. Probably due to diverticulosis and appears to be resolving with Miralax   Kadee Philyaw JR,Aleina Burgio L 08/17/2015, 11:10 AM  Pager: [phone] If no answer or after hours call [phone]

## 2015-08-17 NOTE — Progress Notes (Addendum)
Patient c/o feeling the urge to void but unable to. Patient bladder scanned, bladder scan showed 417mL. MD made aware. Per MD, put in in and out cath. Will continue to monitor.   In and out cath placed, drained 430mL. Will continue to monitor output.

## 2015-08-18 LAB — GLUCOSE, CAPILLARY
GLUCOSE-CAPILLARY: 144 mg/dL — AB (ref 65–99)
GLUCOSE-CAPILLARY: 163 mg/dL — AB (ref 65–99)
GLUCOSE-CAPILLARY: 188 mg/dL — AB (ref 65–99)

## 2015-08-18 LAB — URINALYSIS, ROUTINE W REFLEX MICROSCOPIC
Bilirubin Urine: NEGATIVE
Glucose, UA: NEGATIVE mg/dL
Hgb urine dipstick: NEGATIVE
KETONES UR: 15 mg/dL — AB
NITRITE: NEGATIVE
PH: 5.5 (ref 5.0–8.0)
PROTEIN: NEGATIVE mg/dL
Specific Gravity, Urine: 1.013 (ref 1.005–1.030)
UROBILINOGEN UA: 1 mg/dL (ref 0.0–1.0)

## 2015-08-18 LAB — OCCULT BLOOD X 1 CARD TO LAB, STOOL: Fecal Occult Bld: NEGATIVE

## 2015-08-18 LAB — URINE MICROSCOPIC-ADD ON

## 2015-08-18 MED ORDER — LANTUS SOLOSTAR 100 UNIT/ML ~~LOC~~ SOPN: 10.0000 [IU] | PEN_INJECTOR | Freq: Every day | SUBCUTANEOUS | Status: DC

## 2015-08-18 MED ORDER — SPIRONOLACTONE 25 MG PO TABS: 12.5000 mg | ORAL_TABLET | Freq: Every day | ORAL | Status: DC

## 2015-08-18 MED ORDER — POLYETHYLENE GLYCOL 3350 17 G PO PACK: 17.0000 g | PACK | Freq: Every day | ORAL | Status: DC

## 2015-08-18 MED ORDER — IRBESARTAN 300 MG PO TABS
300.0000 mg | ORAL_TABLET | Freq: Every day | ORAL | Status: DC
Start: 2015-08-19 — End: 2015-08-18

## 2015-08-18 MED ORDER — AMLODIPINE BESYLATE 10 MG PO TABS: 10.0000 mg | ORAL_TABLET | Freq: Every day | ORAL | Status: DC

## 2015-08-18 MED ORDER — POTASSIUM CHLORIDE CRYS ER 10 MEQ PO TBCR: 10.0000 meq | EXTENDED_RELEASE_TABLET | Freq: Every day | ORAL | Status: DC

## 2015-08-18 MED ORDER — SPIRONOLACTONE 25 MG PO TABS
12.5000 mg | ORAL_TABLET | Freq: Every day | ORAL | Status: DC
  Administered 2015-08-18: 12.5 mg via ORAL
  Filled 2015-08-18: qty 1

## 2015-08-18 MED ORDER — WHITE PETROLATUM GEL
Status: AC
  Administered 2015-08-18: 13:00:00
  Filled 2015-08-18: qty 1

## 2015-08-18 MED ORDER — IRBESARTAN 300 MG PO TABS: 300.0000 mg | ORAL_TABLET | Freq: Every day | ORAL | Status: DC

## 2015-08-18 NOTE — Clinical Social Work Note (Signed)
Clinical Social Worker facilitated patient discharge including contacting patient family (dtr at bedside) and facility to confirm patient discharge plans.  Clinical information faxed to facility and family agreeable with plan.  CSW arranged ambulance transport via [facility] to [facility].  RN to call report prior to discharge [phone].  DC packet prepared on chart with number for report.   Clinical Social Worker will sign off for now as social work intervention is no longer needed. Please consult [place] again if new need arises.  Glendon Axe, MSW, LCSWA [phone] 08/18/2015 2:12 PM

## 2015-08-18 NOTE — Progress Notes (Signed)
Physical Therapy Treatment Patient Details Name: Molly [name] MRN: [id number] DOB: [birth year] Today's Date: 08/18/2015    History of Present Illness This 81 y.o. female admitted with Rt sided weakness.  MRI showed expected evolution of recent Lt MCA infarct. Family decided against tPA  Pt with worsening Rt sided weakness 9/30 am.  Repeat MRI showed 1.5 cm recurrent acute infarct centered in the Lt corona radiata new since previous MRI.  PMH includes:  recent  Lt basal ganglia infarct s/p tPA 07/29/15; anemia, HTN, DM, metastatic breast CA, s/p Rt hemiarthroplasty  2011,     PT Comments    Patient with limited participation this session.  Already up at EOB w/OT today and reports doesn't want to do any more due to dizziness.  Did agree to in bed therex, but minimally participative due to lethargy, needed mod cues to continue to participate.  Will continue skilled PT for progression hopefully to home following SNF level rehab (if d/c home would need 24 hour assist along with wheelchair and hospital bed,)  Follow Up Recommendations  Supervision/Assistance - 24 hour;SNF     Equipment Recommendations  Wheelchair (measurements PT);Wheelchair cushion (measurements PT);Hospital bed    Recommendations for Other Services       Precautions / Restrictions Precautions Precautions: Fall Precaution Comments: dense R hemi Restrictions Weight Bearing Restrictions: No    Mobility  Bed Mobility Overal bed mobility: Needs Assistance Bed Mobility: Supine to Sit;Sit to Supine     Supine to sit: HOB elevated;Mod assist;+2 for physical assistance Sit to supine: Max assist;+2 for physical assistance   General bed mobility comments: NT, pt agreeable to in bed therex only  Transfers                    Ambulation/Gait                 Stairs            Wheelchair Mobility    Modified Rankin (Stroke Patients Only) Modified Rankin (Stroke Patients Only) Pre-Morbid Rankin  Score: No symptoms Modified Rankin: Severe disability     Balance Overall balance assessment: Needs assistance Sitting-balance support: Feet supported;Single extremity supported Sitting balance-Leahy Scale: Poor Sitting balance - Comments: min A, cues for more upright posture with pt initiating but unable to come fully upright. Sat EOB about 3 minutes                            Cognition Arousal/Alertness: Lethargic Behavior During Therapy: WFL for tasks assessed/performed Overall Cognitive Status: Within Functional Limits for tasks assessed                      Exercises General Exercises - Upper Extremity Shoulder Flexion: PROM;Strengthening;Right;Left;5 reps;10 reps;Supine (R PROM, L with soda can for strengthening) Elbow Flexion: PROM;AROM;Right;Left;10 reps;5 reps;Supine;Strengthening (R PROM, L w/ soda can for strengthening) General Exercises - Lower Extremity Ankle Circles/Pumps: AROM;AAROM;Both;5 reps;Supine Short Arc Quad: AROM;Left;10 reps;Supine Heel Slides: PROM;AROM;Right;Left;10 reps;5 reps;Supine (PROM/AAROM Rt; AROM LT.) Other Exercises Other Exercises: hip/knee extension with foot on my arm w/ resistance, x 5-10 each leg for weight bearing, joint approx, strength    General Comments        Pertinent Vitals/Pain Pain Assessment: Faces Faces Pain Scale: Hurts little more Pain Location: right LE with spasms Pain Intervention(s): Monitored during session;Repositioned;Limited activity within patient's tolerance    Home Living  Prior Function            PT Goals (current goals can now be found in the care plan section) Acute Rehab PT Goals Patient Stated Goal: did not state Progress towards PT goals: Not progressing toward goals - comment    Frequency  Min 3X/week    PT Plan Discharge plan needs to be updated;Frequency needs to be updated    Co-evaluation             End of Session   Activity  Tolerance: Patient limited by fatigue;Patient limited by lethargy Patient left: in bed;with call bell/phone within reach     Time: 1425-1445 PT Time Calculation (min) (ACUTE ONLY): 20 min  Charges:  $Therapeutic Exercise: 8-22 mins                    G Codes:      Molly [name],Molly [name] 08-28-2015, 4:14 PM  Magda Kiel, [place] 28-Aug-2015

## 2015-08-18 NOTE — Clinical Social Work Placement (Signed)
   CLINICAL SOCIAL WORK PLACEMENT  NOTE  Date:  08/18/2015  Patient Details  Name: Molly [name] MRN: [id number] Date of Birth: Mar 14, 1923  Clinical Social Work is seeking post-discharge placement for this patient at the [place] level of care (*CSW will initial, date and re-position this form in  chart as items are completed):  Yes   Patient/family provided with [place] Work Department's list of facilities offering this level of care within the geographic area requested by the patient (or if unable, by the patient's family).  Yes   Patient/family informed of their freedom to choose among providers that offer the needed level of care, that participate in Medicare, Medicaid or managed care program needed by the patient, have an available bed and are willing to accept the patient.  Yes   Patient/family informed of [facility]'s ownership interest in [facility] and [facility], as well as of the fact that they are under no obligation to receive care at these facilities.  PASRR submitted to EDS on 08/14/15     PASRR number received on 08/14/15     Existing PASRR number confirmed on       FL2 transmitted to all facilities in geographic area requested by pt/family on 08/15/15     FL2 transmitted to all facilities within larger geographic area on       Patient informed that his/her managed care company has contracts with or will negotiate with certain facilities, including the following:        Yes   Patient/family informed of bed offers received.  Patient chooses bed at  [facility] )     Physician recommends and patient chooses bed at      Patient to be transferred to  [facility] ) on 08/18/15.  Patient to be transferred to facility by  [facility] )     Patient family notified on 08/18/15 of transfer.  Name of family member notified:   (Pt's dtr at bedside, Molly [name] )     PHYSICIAN        Additional Comment:    _______________________________________________ Glendon Axe, MSW, [place] [phone] 08/18/2015 2:12 PM

## 2015-08-18 NOTE — Progress Notes (Signed)
Patient ready for transportation to [facility] facility. IV removed, family and patient aware of move. Per MD, do not i&o cath or place foley catheter, per MD, monitor output, bladder scan was 365mL. Will continue to monitor until [facility] transports patient.

## 2015-08-18 NOTE — Progress Notes (Signed)
Patient bladder scanned, bladder scan showed 564mL. Per MD, I&O cath. I&O placed, drained 766mL of yellow, clear, no odor urine. Will continue to monitor

## 2015-08-18 NOTE — Discharge Summary (Signed)
Stroke Discharge Summary  Patient ID: Molly [name]   MRN: [id number]      DOB: Sep 08, 1923  Date of Admission: 08/09/2015 Date of Discharge: 08/18/2015  Attending Physician:  Garvin Fila, MD, Stroke MD  Consulting Physician(s):     Ladell Pier, MD (oncology), Laurence Spates, MD (GI), Albertine Patricia, MD (Internal Medicine), Delice Lesch, MD (rehabilitation medicine) and Tera Partridge, MD (CCM) Patient's PCP:  Antony Blackbird, MD   DISCHARGE DIAGNOSIS:  Recurrent left coronal radiation infarct - due to left terminal ICA high grade stenosis in the setting of labile BP fluctuation, with additional recurrent stroke in hospital Resultant Right facial drop and right mild hemiparesis B/L intracranial ICA stenosis Labile BP  History of left MCA territory infarct  Anxiety  Subacute Lower GI Bleeding Hemorrhoids Hyperlipidemia Diabetes Dehydration, resolved Obesity, Body mass index is 31.96 kg/(m^2).  Urinary Retention Contrast allergy  Right breast cancer with stable bone, blood and soft tissue metastasis Leukocytosis due to metastasis breast cancer   Past Medical History  Diagnosis Date  . Hypertension   . MVP (mitral valve prolapse)   . Hypothyroidism   . Anemia   . Hypercholesterolemia   . Heart murmur   . Type II diabetes mellitus   . Arthritis     "back; knees, hands" (05/11/2015)  . Breast cancer, right breast     "cells went into the blood and caused her hip to break" (05/11/2015)   Past Surgical History  Procedure Laterality Date  . Hemiarthroplasty hip Right     "partial replacement"  . Pelvic and para-aortic lymph node dissection    . Tonsillectomy    . Appendectomy    . Cholecystectomy    . US echocardiography  03/30/2009    EF 55-60%  . Cardiovascular stress test  01/28/2008    EF 74%  . Esophagogastroduodenoscopy N/A 07/08/2014    Procedure: ESOPHAGOGASTRODUODENOSCOPY (EGD);  Surgeon: Winfield Cunas., MD;  Location: [facility];   Service: Endoscopy;  Laterality: N/A;  . Colonoscopy N/A 07/10/2014    Procedure: COLONOSCOPY;  Surgeon: Lear Ng, MD;  Location: [facility];  Service: Endoscopy;  Laterality: N/A;  . Total abdominal hysterectomy      w/BSO  . Dilation and curettage of uterus    . Breast biopsy Right 2009  . Breast lumpectomy Right 2009  . Fracture surgery    . Cataract extraction w/ intraocular lens  implant, bilateral Bilateral       Medication List    STOP taking these medications        acetaminophen 500 MG tablet  Commonly known as:  TYLENOL     amLODipine-valsartan 10-320 MG tablet  Commonly known as:  EXFORGE     aspirin EC 81 MG tablet     cephALEXin 500 MG capsule  Commonly known as:  KEFLEX     cholecalciferol 1000 UNITS tablet  Commonly known as:  VITAMIN D     clopidogrel 75 MG tablet  Commonly known as:  PLAVIX     docusate sodium 100 MG capsule  Commonly known as:  COLACE      TAKE these medications        amLODipine 10 MG tablet  Commonly known as:  NORVASC  Take 1 tablet (10 mg total) by mouth daily.  Start taking on:  08/19/2015     anastrozole 1 MG tablet  Commonly known as:  ARIMIDEX  TAKE 1 TABLET BY MOUTH EVERY DAY  atenolol 50 MG tablet  Commonly known as:  TENORMIN  Take 1 tablet (50 mg total) by mouth 3 (three) times daily.     B-12 500 MCG Tabs  Take 500 mcg by mouth daily after lunch.     CALCIUM + D PO  Take 1 tablet by mouth daily after lunch.     cloNIDine 0.1 MG tablet  Commonly known as:  CATAPRES  Take 1 tablet (0.1 mg total) by mouth 2 (two) times daily.     ezetimibe 10 MG tablet  Commonly known as:  ZETIA  Take 1 tablet (10 mg total) by mouth daily.     FERROCITE 324 (106 FE) MG Tabs  Generic drug:  Ferrous Fumarate  TAKE 1 TABLET (106 MG OF IRON TOTAL) BY MOUTH DAILY.     irbesartan 300 MG tablet  Commonly known as:  AVAPRO  Take 1 tablet (300 mg total) by mouth daily.  Start taking on:  08/19/2015     LANTUS  SOLOSTAR 100 UNIT/ML Solostar Pen  Generic drug:  Insulin Glargine  Inject 10 Units into the skin daily at 12 noon.     levothyroxine 75 MCG tablet  Commonly known as:  SYNTHROID, LEVOTHROID  Take 75 mcg by mouth daily before breakfast.     polyethylene glycol packet  Commonly known as:  MIRALAX / GLYCOLAX  Take 17 g by mouth daily.     potassium chloride 10 MEQ tablet  Commonly known as:  KLOR-CON M10  Take 1 tablet (10 mEq total) by mouth daily after lunch.     spironolactone 25 MG tablet  Commonly known as:  ALDACTONE  Take 0.5 tablets (12.5 mg total) by mouth daily.        LABORATORY STUDIES CBC    Component Value Date/Time   WBC 19.9* 08/17/2015 0220   WBC 24.8* 02/07/2015 1432   RBC 3.64* 08/17/2015 0220   RBC 4.20 02/07/2015 1432   HGB 10.8* 08/17/2015 0220   HGB 12.5 02/07/2015 1432   HCT 33.8* 08/17/2015 0220   HCT 38.0 02/07/2015 1432   PLT 254 08/17/2015 0220   PLT 336 02/07/2015 1432   MCV 92.9 08/17/2015 0220   MCV 90.5 02/07/2015 1432   MCH 29.7 08/17/2015 0220   MCH 29.8 02/07/2015 1432   MCHC 32.0 08/17/2015 0220   MCHC 32.9 02/07/2015 1432   RDW 13.6 08/17/2015 0220   RDW 14.0 02/07/2015 1432   LYMPHSABS 15.5* 08/10/2015 0030   LYMPHSABS 13.2* 02/07/2015 1432   MONOABS 1.6* 08/10/2015 0030   MONOABS 1.0* 02/07/2015 1432   EOSABS 0.3 08/10/2015 0030   EOSABS 0.3 02/07/2015 1432   BASOSABS 0.0 08/10/2015 0030   BASOSABS 0.1 02/07/2015 1432   CMP    Component Value Date/Time   NA 135 08/16/2015 0954   NA 136 09/22/2014 1426   K 4.4 08/16/2015 0954   K 3.8 09/22/2014 1426   CL 104 08/16/2015 0954   CO2 21* 08/16/2015 0954   CO2 21* 09/22/2014 1426   GLUCOSE 214* 08/16/2015 0954   GLUCOSE 308* 09/22/2014 1426   BUN 14 08/16/2015 0954   BUN 11.4 09/22/2014 1426   CREATININE 0.83 08/16/2015 0954   CREATININE 0.9 09/22/2014 1426   CALCIUM 8.7* 08/16/2015 0954   CALCIUM 9.5 09/22/2014 1426   PROT 6.3* 08/10/2015 0030   PROT 6.7  09/22/2014 1426   ALBUMIN 3.4* 08/10/2015 0030   ALBUMIN 3.4* 09/22/2014 1426   AST 24 08/10/2015 0030   AST 14 09/22/2014 1426  ALT 22 08/10/2015 0030   ALT 17 09/22/2014 1426   ALKPHOS 80 08/10/2015 0030   ALKPHOS 90 09/22/2014 1426   BILITOT 0.6 08/10/2015 0030   BILITOT 0.23 09/22/2014 1426   GFRNONAA >60 08/16/2015 0954   GFRAA >60 08/16/2015 0954   COAGS Lab Results  Component Value Date   INR 1.20 08/15/2015   INR 1.07 08/10/2015   INR 1.11 07/29/2015   Lipid Panel    Component Value Date/Time   CHOL 186 07/30/2015 0239   TRIG 182* 07/30/2015 0239   HDL 36* 07/30/2015 0239   CHOLHDL 5.2 07/30/2015 0239   VLDL 36 07/30/2015 0239   LDLCALC 114* 07/30/2015 0239   HgbA1C  Lab Results  Component Value Date   HGBA1C 7.8* 07/30/2015   Cardiac Panel (last 3 results) No results for input(s): CKTOTAL, CKMB, TROPONINI, RELINDX in the last 72 hours. Urinalysis    Component Value Date/Time   COLORURINE YELLOW 08/18/2015 1108   APPEARANCEUR CLOUDY* 08/18/2015 1108   LABSPEC 1.013 08/18/2015 1108   PHURINE 5.5 08/18/2015 1108   GLUCOSEU NEGATIVE 08/18/2015 1108   HGBUR NEGATIVE 08/18/2015 1108   BILIRUBINUR NEGATIVE 08/18/2015 1108   KETONESUR 15* 08/18/2015 1108   PROTEINUR NEGATIVE 08/18/2015 1108   UROBILINOGEN 1.0 08/18/2015 1108   NITRITE NEGATIVE 08/18/2015 1108   LEUKOCYTESUR SMALL* 08/18/2015 1108   Urine Drug Screen     Component Value Date/Time   LABOPIA NONE DETECTED 08/10/2015 0106   COCAINSCRNUR NONE DETECTED 08/10/2015 0106   LABBENZ NONE DETECTED 08/10/2015 0106   AMPHETMU NONE DETECTED 08/10/2015 0106   THCU NONE DETECTED 08/10/2015 0106   LABBARB NONE DETECTED 08/10/2015 0106    Alcohol Level    Component Value Date/Time   ETH <5 08/10/2015 0030     SIGNIFICANT DIAGNOSTIC STUDIES  Ct Head Wo Contrast 08/14/2015   Increasingly well-defined hypoattenuation representing acute infarction LEFT MCA territory. No hemorrhagic transformation.     08/12/2015    Atrophy with small vessel chronic ischemic changes of deep cerebral white matter.  Subacute nonhemorrhagic infarct at LEFT basal ganglia unchanged.  Extensive atherosclerotic calcification.  No new intracranial abnormalities.    08/10/2015   1. Expected evolution of the left basal ganglia infarct since prior exam. No interval hemorrhage, mass effect or midline shift. 2. Background chronic small vessel ischemia without CT findings of acute territorial infarct.    MRI & MRA Head Wo Contrast 08/10/2015    1. Expected evolution of the recent left MCA white matter infarct. No interval hemorrhage. No associated mass effect. 2. No new intracranial abnormality. 3. Stable severe intracranial atherosclerosis on MRA. Moderate to high-grade stenoses of the distal left vertebral artery, both distal ICA siphons, and the proximal basilar artery.     Mr Brain Wo Contrast 08/12/2015   1. 1.5 cm recurrent acute infarct centered in the posterior left corona radiata, new from the MRI of 2 days ago. This is in the same location although is mildly larger than that on the 07/30/2015 MRI. 2. Chronic ischemic changes as above.     Dg Chest Portable 1 View 08/10/2015    No acute pulmonary process.     2D ECHO 07/30/15  - Left ventricle: The cavity size was normal. There was moderateconcentric hypertrophy. Systolic function was vigorous. Theestimated ejection fraction was in the range of 65% to 70%. Wallmotion was normal; there were no regional wall motionabnormalities. Doppler parameters are consistent with abnormalleft ventricular relaxation (grade 1 diastolic dysfunction).Doppler parameters are consistent with high ventricular  fillingpressure. - Aortic valve: Trileaflet; moderately thickened, moderately calcified leaflets especially non coronary cusp. Valve mobilitywas mildly restricted. There was very mild stenosis. Peakvelocity (S): 244 cm/s. Mean gradient (S): 13 mm Hg. Valve area(VTI): 1.42 cm^2.  Valve area (Vmax): 1.35 cm^2. Valve area(Vmean): 1.17 cm^2. - Mitral valve: Calcified annulus Impressions: No cardiac source of emboli was indentified. When compared toprior, mild aortic stenosis is now present.     HISTORY OF PRESENT ILLNESS Molly [name] is an 81 y.o. female hx of HTN, DM, recent CVA admitted with acute onset of right sided weakness and slurred speech. LKW at 2250 on 08/09/2015 when she went to bed, shortly later she called out to her daughter that she was having a stroke. Daughter noted right sided weakness and slurred speech. EMS called. CT head imaging reviewed and no signs of acute stroke. Modified Rankin: Rankin Score=0.   Patient was admitted on 9/16 with an acute stroke. At that time had right sided weakness though noted to be worse than current presentation. She received tPA at that time and per daughter was asymptomatic upon discharge.   Had extensive discussion with patient and her daughter Molly [name] in regards to tPA in this case. Counseled them that tPA is a relative contraindication due to recent stroke on 9/16. Upon initial presentation her symptoms were mild (NIHSS of 4) with mild right sided weakness. At that time they stated they wished to hold off on tPA. Shortly later her right sided weakness worsened to a flaccid weakness. Again discussed tPA risks/benefits and they wished to go ahead with tPA at that time. Prior to initiation of tPA her strength improved and they again wished to hold on tPA. Counseled them on concern that she may worsen again vs risk of ICH. They express understanding and wish to hold on tPA at this time.    HOSPITAL COURSE Molly [name] is a 81 y.o. female with history of HTN, HLD, DM, right breast cancer with stable mets to bones and soft tissue, and recent left CR stroke treated with TPA on 07/29/15 presenting with acute onset of right-sided weakness and slurred speech. She did not receive IV t-PA due to patient and family decision not  to treat. Symptoms improved.  Recurrent left coronal radiation infarct - due to left terminal ICA high grade stenosis in the setting of labile BP fluctuation, with additional recurrent stroke in hospital  Resultant Right facial drop and right mild hemiparesis  MRI new recurrent left CR infarct  MRA head stable high grade stenosis on the bilateral ICA siphon, L>R  MRA neck 07/30/2015 revealed no significant disease  2D Echo 07/30/2015 - EF 65-70%. No cardiac source of emboli identified.  LDL 07/30/2015 - 114  HgbA1c 07/30/2015 - 7.8  aspirin 81 mg orally every day and clopidogrel 75 mg orally every day prior to admission, now on no antithrombotic due to GIB during hospitalization. Recommend to resume aspirin 81 mg daily once GIB totally resolves. Do NOT recommend use of plavix.  Therapy recommendations:SNF. Pt unable to tolerate 3h therapy a day for CIR  Disposition: Discharge to skilled nursing facility for ongoing PT, OT and ST.   B/L intracranial ICA stenosis  MRA showed left > 80% stenosis and right 60% stenosis  In the setting of labile BP, it is likely the cause of recurrent left MCA infarct  Due to recurrent left MCA infarcts, may consider left MCA stenting-discussed with patient and daughter 08/13/15 but they are reluctant at the present time  and would like her to recover in rehabilitation prior to considering revascularization  However, due to her advanced age and metastasis breast Ca, she is not a good candidate   Dual antiplatelets recommended for IC stenosis, however, GIB precludes  Labile BP control  BP improving but still elevated  Back on home regimen week of discharge. spironlactone readded day of admission  Continue to monitor   History of left MCA territory infarct 2 weeks prior to admission  S/p tPA at that time  Most likely due to left terminal ICA high grade stenosis in the setting of hypotension  No significant residue on discharge    Discharged on ASA and plavix and zetia  Discharged with home health  Anxiety   Pt very anxious about her condition, about going home, about her BP, about her medications  Placed on clonazepam bid in hospital  As improved, decreased dose by 50% bid  Continued to be sedated  Klonopin decreased prn.  Do not recommend sedation   Subacute Lower GI Bleeding Hemorrhoids  Stopped aspirin and plavix at time of bleeding  CBC stable  GI consult - on Miralax, PPI. ok to resume either aspirin or plavix once stool clears. Continue Miralax  Rectal bleeding continues to resolve, diarrhea continues  Day of discharge - ok to increase diet; needs to continue miralax to avoid bleeding  Hyperlipidemia  Home meds: Zetia resumed in hospital  LDL 114, goal < 70  Lipitor intolerance  On Zetia, continue Zetia on discharge.  Diabetes  Home meds - lantus  HgbA1c 7.8, goal < 7.0  Uncontrolled  Will resume 1/2 dose Home lantus at discharge  Needs ongonig monitoring and adjustment back to home dose as dietary intake increases  Dehydration  resolved  Other Stroke Risk Factors  Advanced age  Obesity, Body mass index is 31.96 kg/(m^2).   Hx stroke/TIA  Urinary Retention  I & O cath required, last with volume  UA with small leukocytes, no nitrates day of discharge  culture pending  Foley placed prior to discharge (per hospital protocol, I&O x 3 with ongoing retention)  SNF provider to folloow  Other Active Problems  Contrast allergy   Right breast cancer with stable bone, blood and soft tissue metastasis (life expectancy can be many years with this per Dr. Ammie Dalton. Not imminently an issue. Can consider palliative care in the future should she deteriorate)  Leukocytosis - elevated but stable - due to metastasis breast cancer   DISCHARGE EXAM Blood pressure 189/54, pulse 62, temperature 97.7 F (36.5 C), temperature source Oral, resp. rate 15, height 5\' 4"   (1.626 m), weight 84.5 kg (186 lb 4.6 oz), SpO2 96 %.  General - Well nourished, well developed, in no apparent distress.  Ophthalmologic - Fundi not visualized due to eye movement.  Cardiovascular - Regular rhythm with no murmur.  Mental Status -  Level of arousal and orientation to time, place, and person were intact. Language including expression, naming, repetition, comprehension was assessed and found intact. Fund of Knowledge was assessed and was intact.  Cranial Nerves II - XII - II - Visual field intact OU. III, IV, VI - Extraocular movements intact. V - Facial sensation intact bilaterally. VII - right facial droop, . VIII - Hearing & vestibular intact bilaterally. X - Palate elevates symmetrically. XI - Chin turning & shoulder shrug intact bilaterally. XII - Tongue protrusion intact.  Motor Strength - The patient's strength was 0/5 RUE and 1/5 RLE. LUE and LLE 5/5/. Bulk was  normal and fasciculations were absent.  Motor Tone - Muscle tone was assessed at the neck and appendages and was normal.  Reflexes - The patient's reflexes were 1+ in all extremities and she had no pathological reflexes.  Sensory - Light touch, temperature/pinprick were assessed and were symmetrical.   Coordination - The patient had normal movements in the hands and feet with no ataxia or dysmetria.   Gait and Station - deferred due to high BP.  Discharge Diet   DIET DYS 3, soft, carb modified with small bites/sips, meds with puree. Fluid consistency:: Thin liquids   DISCHARGE PLAN  Disposition:  Discharge to skilled nursing facility for ongoing PT, OT and ST.    Resume aspirin 81 mg daily once GI hemorrhage for secondary stroke prevention (do not recommend plavix 75 mg use given GIB). Call Dr. Leonie Man for any concerns  Follow-up FULP, CAMMIE, MD in 2 weeks.  Follow-up with Dr. Antony Contras, Stroke Clinic in 2 months.  60 minutes were spent preparing discharge.  [place] for Pager information 08/18/2015 1:45 PM  I have personally examined this patient, reviewed notes, independently viewed imaging studies, participated in medical decision making and plan of care. I have made any additions or clarifications directly to the above note. Agree with note above. I had a long discussion with the patient and daughter at the bedside with regards to her stroke, GI bleed, follow-up plan, prognosis and answered questions  Antony Contras, MD Medical Director [place] Pager: [phone] 08/18/2015 4:37 PM

## 2015-08-18 NOTE — Progress Notes (Signed)
Patient taken by PTAR 

## 2015-08-18 NOTE — Progress Notes (Signed)
Urethral catheter placed for urinary retention, patient tolerated well. 450cc clean, yellow no odor urine drained. Patient cleaned, will continue to monitor until transportation to SNF

## 2015-08-18 NOTE — Care Management Important Message (Signed)
Important Message  Patient Details  Name: Molly [name] MRN: [id number] Date of Birth: 09-18-1923   Medicare Important Message Given:  Yes-fourth notification given    Delorse Lek 08/18/2015, 1:14 PM

## 2015-08-18 NOTE — Progress Notes (Signed)
[facility] PROGRESS NOTE Subjective No gross bleeding.  Objective: Vital signs in last 24 hours: Temp:  [97.5 F (36.4 C)-98.2 F (36.8 C)] 97.7 F (36.5 C) (10/06 1000) Pulse Rate:  [57-65] 62 (10/06 1052) Resp:  [15-20] 15 (10/06 1000) BP: (159-203)/(53-69) 189/54 mmHg (10/06 1000) SpO2:  [97 %-100 %] 98 % (10/06 1000) Last BM Date: 08/18/15  Intake/Output from previous day: 10/05 0701 - 10/06 0700 In: 140 [P.O.:140] Out: 901 [Urine:900; Stool:1] Intake/Output this shift: Total I/O In: -  Out: 700 [Urine:700]  PE:  Abdomen--soft nontender  Lab Results:  Recent Labs  08/15/15 2212 08/16/15 0715 08/16/15 0954 08/16/15 1427 08/17/15 0220  WBC 22.2* 21.4* 19.5* 25.8* 19.9*  HGB 11.2* 12.0 12.2 11.2* 10.8*  HCT 35.0* 35.7* 36.3 34.0* 33.8*  PLT 278 275 268 284 254   BMET  Recent Labs  08/16/15 0954  NA 135  K 4.4  CL 104  CO2 21*  CREATININE 0.83   LFT No results for input(s): PROT, AST, ALT, ALKPHOS, BILITOT, BILIDIR, IBILI in the last 72 hours. PT/INR  Recent Labs  08/15/15 1521  LABPROT 15.4*  INR 1.20   PANCREAS No results for input(s): LIPASE in the last 72 hours.       Studies/Results: No results found.  Medications: I have reviewed the patient's current medications.  Assessment/Plan: 1. LGI bleed. Probably diverticular. Stool this AM w/o gross blood Hg stable. Would advance diet and continue on miralax  And adjust dose to keep stool soft, resume ASA in few days if stable. Will see again as needed. Discusse all with the family.   Kia Varnadore JR,Frans Valente L 08/18/2015, 11:43 AM  Pager: [phone] If no answer or after hours call [phone]

## 2015-08-18 NOTE — Progress Notes (Signed)
Occupational Therapy Treatment Patient Details Name: Molly [name] MRN: [id number] DOB: 11/25/1922 Today's Date: 08/18/2015    History of present illness This 81 y.o. female admitted with Rt sided weakness.  MRI showed expected evolution of recent Lt MCA infarct. Family decided against tPA  Pt with worsening Rt sided weakness 9/30 am.  Repeat MRI showed 1.5 cm recurrent acute infarct centered in the Lt corona radiata new since previous MRI (spoke with RN who ok'd OT evaluation).  PMH includes:  recent  Lt basal ganglia infarct s/p tPA 07/29/15; anemia, HTN, DM, metastatic breast CA, s/p Rt hemiarthroplasty  2011,    OT comments  Pt progressing towards acute OT goals. Pt initially needing encouragement to participate in therapy but agreeable once session began. Focus of session was bed mobility and static sitting balance. Session details below. D/c plan to SNF still appropriate.   Follow Up Recommendations  SNF    Equipment Recommendations  Other (comment) (defer to next venue)    Recommendations for Other Services      Precautions / Restrictions Precautions Precautions: Fall Precaution Comments: dense R hemi Restrictions Weight Bearing Restrictions: No       Mobility Bed Mobility Overal bed mobility: Needs Assistance Bed Mobility: Supine to Sit;Sit to Supine     Supine to sit: HOB elevated;Mod assist;+2 for physical assistance Sit to supine: Max assist;+2 for physical assistance   General bed mobility comments: completed with encouragement. Cued to advance LLE with pt initiating but not continuing movement. transferred to right side of bed. HOB elevated for supine>EOB  Transfers                      Balance Overall balance assessment: Needs assistance Sitting-balance support: Feet supported;Single extremity supported Sitting balance-Leahy Scale: Poor Sitting balance - Comments: min A, cues for more upright posture with pt initiating but unable to come fully  upright. Sat EOB about 3 minutes                           ADL Overall ADL's : Needs assistance/impaired                                       General ADL Comments: Pt initially refusing therapy. OT explained benefits and discussed overall plan of care with pt's daughter. With encouragement and explanation of benefits of therapy and right to refuse pt agreeable to inititate therapy to come to EOB. Pt's daughter indicating hospice/palliative is not planned currently but for "later down the road." Pt completed bed mobility as detailed below. Pt sat EOB about 3 minutes with min A , cues for upright posture with pt initiating but unable to come fully upright. When asked pt indicated she was dizzy and requested laying back down.        Vision                     Perception     Praxis      Cognition   Behavior During Therapy: [facility] for tasks assessed/performed;Flat affect                         Extremity/Trunk Assessment               Exercises     Shoulder Instructions  General Comments  Per pt and daughter's request 4/4 bed rails placed up. Daughter stating, "she prefers it that way."    Pertinent Vitals/ Pain       Pain Assessment: Faces Faces Pain Scale: Hurts little more Pain Location: not specified, no verbal c/o pain Pain Intervention(s): Monitored during session;Repositioned;Limited activity within patient's tolerance  Home Living                                          Prior Functioning/Environment              Frequency Min 2X/week     Progress Toward Goals  OT Goals(current goals can now be found in the care plan section)  Progress towards OT goals: Progressing toward goals  Acute Rehab OT Goals Patient Stated Goal: did not state OT Goal Formulation: With patient/family Time For Goal Achievement: 08/26/15 Potential to Achieve Goals: Good ADL Goals Pt Will Perform Eating:  Independently;sitting Pt Will Perform Grooming: with min guard assist;sitting Pt Will Perform Upper Body Bathing: with min assist;sitting Pt Will Perform Lower Body Bathing: with modified independence;sit to/from stand Pt Will Perform Lower Body Dressing: with modified independence;sit to/from stand Pt Will Transfer to Toilet: with mod assist;stand pivot transfer;bedside commode Pt Will Perform Toileting - Clothing Manipulation and hygiene: with modified independence;sit to/from stand Pt/caregiver will Perform Home Exercise Program: Increased ROM;Right Upper extremity;With Supervision;With written HEP provided  Plan Discharge plan remains appropriate    Co-evaluation                 End of Session     Activity Tolerance Patient tolerated treatment well   Patient Left in bed;with call bell/phone within reach;with bed alarm set;with SCD's reapplied   Nurse Communication          Time: 5027-7412 OT Time Calculation (min): 23 min  Charges: OT General Charges $OT Visit: 1 Procedure OT Treatments $Self Care/Home Management : 23-37 mins  Hortencia Pilar 08/18/2015, 1:52 PM

## 2015-08-19 ENCOUNTER — Other Ambulatory Visit: Payer: Self-pay | Admitting: Pharmacist

## 2015-08-19 NOTE — Patient Outreach (Signed)
[facility]) Care Management  [place]   08/19/2015  [name] 1923/10/16 [id number]  Subjective: Molly [name] is a 81 y.o. female who was referred to [facility] for medication assistance. Patient was admitted to the hospital with CVA and has been discharged to [facility] for skilled nursing care.  Will close patient case.  Nicoletta Ba, PharmD, [facility] [phone]

## 2015-08-19 NOTE — Patient Outreach (Signed)
[facility]) Care Management  08/19/2015  Molly [name] May 10, 1923 [id number]   [facility]   Patient discharged from [facility] on 08/19/2015 due to patient admitted to SNF on 08/19/2015 following hospital discharge.    Plan:   RN CM notified [place] Team Administrative Assistant Molly [name] Stroke Case closed.  RN CM sent case closure letter to Dr. Leonie Man.   Molly Laster, RN, BSN, [facility], CCM  [employer] Management Coordinator [phone] Direct [phone] Cell [phone] Office [phone] Fax

## 2015-08-20 ENCOUNTER — Encounter (HOSPITAL_COMMUNITY): Payer: Self-pay | Admitting: Emergency Medicine

## 2015-08-20 ENCOUNTER — Emergency Department (HOSPITAL_COMMUNITY)
Admission: EM | Admit: 2015-08-20 | Discharge: 2015-08-21 | Disposition: A | Discharge status: Home / Self Care | Payer: Medicare Other | Attending: Emergency Medicine | Admitting: Emergency Medicine

## 2015-08-20 ENCOUNTER — Emergency Department (HOSPITAL_COMMUNITY): Payer: Medicare Other

## 2015-08-20 DIAGNOSIS — R531 Weakness: Secondary | ICD-10-CM | POA: Diagnosis present

## 2015-08-20 DIAGNOSIS — Z8739 Personal history of other diseases of the musculoskeletal system and connective tissue: Secondary | ICD-10-CM | POA: Insufficient documentation

## 2015-08-20 DIAGNOSIS — N390 Urinary tract infection, site not specified: Secondary | ICD-10-CM

## 2015-08-20 DIAGNOSIS — Z79899 Other long term (current) drug therapy: Secondary | ICD-10-CM | POA: Diagnosis not present

## 2015-08-20 DIAGNOSIS — Z794 Long term (current) use of insulin: Secondary | ICD-10-CM | POA: Insufficient documentation

## 2015-08-20 DIAGNOSIS — R011 Cardiac murmur, unspecified: Secondary | ICD-10-CM | POA: Insufficient documentation

## 2015-08-20 DIAGNOSIS — H538 Other visual disturbances: Secondary | ICD-10-CM | POA: Diagnosis not present

## 2015-08-20 DIAGNOSIS — E119 Type 2 diabetes mellitus without complications: Secondary | ICD-10-CM | POA: Insufficient documentation

## 2015-08-20 DIAGNOSIS — T83511A Infection and inflammatory reaction due to indwelling urethral catheter, initial encounter: Secondary | ICD-10-CM

## 2015-08-20 DIAGNOSIS — R4781 Slurred speech: Secondary | ICD-10-CM | POA: Insufficient documentation

## 2015-08-20 DIAGNOSIS — E039 Hypothyroidism, unspecified: Secondary | ICD-10-CM | POA: Insufficient documentation

## 2015-08-20 DIAGNOSIS — Z853 Personal history of malignant neoplasm of breast: Secondary | ICD-10-CM | POA: Diagnosis not present

## 2015-08-20 DIAGNOSIS — Z862 Personal history of diseases of the blood and blood-forming organs and certain disorders involving the immune mechanism: Secondary | ICD-10-CM | POA: Insufficient documentation

## 2015-08-20 DIAGNOSIS — I10 Essential (primary) hypertension: Secondary | ICD-10-CM | POA: Insufficient documentation

## 2015-08-20 LAB — CBC WITH DIFFERENTIAL/PLATELET
BASOS ABS: 0 10*3/uL (ref 0.0–0.1)
BASOS PCT: 0 %
EOS ABS: 0 10*3/uL (ref 0.0–0.7)
Eosinophils Relative: 0 %
HEMATOCRIT: 34.9 % — AB (ref 36.0–46.0)
HEMOGLOBIN: 11.4 g/dL — AB (ref 12.0–15.0)
LYMPHS PCT: 26 %
Lymphs Abs: 6.3 10*3/uL — ABNORMAL HIGH (ref 0.7–4.0)
MCH: 29.8 pg (ref 26.0–34.0)
MCHC: 32.7 g/dL (ref 30.0–36.0)
MCV: 91.1 fL (ref 78.0–100.0)
MONOS PCT: 1 %
Monocytes Absolute: 0.2 10*3/uL (ref 0.1–1.0)
NEUTROS PCT: 73 %
Neutro Abs: 17.6 10*3/uL — ABNORMAL HIGH (ref 1.7–7.7)
Platelets: 292 10*3/uL (ref 150–400)
RBC: 3.83 MIL/uL — ABNORMAL LOW (ref 3.87–5.11)
RDW: 13.7 % (ref 11.5–15.5)
WBC: 24.1 10*3/uL — ABNORMAL HIGH (ref 4.0–10.5)

## 2015-08-20 LAB — BASIC METABOLIC PANEL
ANION GAP: 11 (ref 5–15)
BUN: 15 mg/dL (ref 6–20)
CALCIUM: 9.1 mg/dL (ref 8.9–10.3)
CO2: 22 mmol/L (ref 22–32)
Chloride: 101 mmol/L (ref 101–111)
Creatinine, Ser: 1.05 mg/dL — ABNORMAL HIGH (ref 0.44–1.00)
GFR, EST AFRICAN AMERICAN: 53 mL/min — AB (ref >60)
GFR, EST NON AFRICAN AMERICAN: 46 mL/min — AB (ref >60)
Glucose, Bld: 179 mg/dL — ABNORMAL HIGH (ref 65–99)
POTASSIUM: 4 mmol/L (ref 3.5–5.1)
Sodium: 134 mmol/L — ABNORMAL LOW (ref 135–145)

## 2015-08-20 LAB — URINALYSIS, ROUTINE W REFLEX MICROSCOPIC
Glucose, UA: NEGATIVE mg/dL
Ketones, ur: 15 mg/dL — AB
Nitrite: POSITIVE — AB
PROTEIN: 100 mg/dL — AB
Specific Gravity, Urine: 1.026 (ref 1.005–1.030)
UROBILINOGEN UA: 2 mg/dL — AB (ref 0.0–1.0)
pH: 5.5 (ref 5.0–8.0)

## 2015-08-20 LAB — URINE MICROSCOPIC-ADD ON

## 2015-08-20 MED ORDER — DEXTROSE 5 % IV SOLN
1.0000 g | Freq: Once | INTRAVENOUS | Status: AC
  Administered 2015-08-20: 1 g via INTRAVENOUS
  Filled 2015-08-20: qty 10

## 2015-08-20 MED ORDER — ATENOLOL 25 MG PO TABS
50.0000 mg | ORAL_TABLET | Freq: Once | ORAL | Status: AC
  Administered 2015-08-20: 50 mg via ORAL
  Filled 2015-08-20: qty 2

## 2015-08-20 MED ORDER — SODIUM CHLORIDE 0.9 % IV BOLUS (SEPSIS)
1000.0000 mL | Freq: Once | INTRAVENOUS | Status: AC
  Administered 2015-08-20: 1000 mL via INTRAVENOUS

## 2015-08-20 MED ORDER — CLONIDINE HCL 0.1 MG PO TABS
0.1000 mg | ORAL_TABLET | Freq: Once | ORAL | Status: AC
  Administered 2015-08-20: 0.1 mg via ORAL
  Filled 2015-08-20: qty 1

## 2015-08-20 NOTE — ED Notes (Signed)
Foley catheter to be changed due to 'catheter associated infection' per Dr. Rex Kras.

## 2015-08-20 NOTE — ED Notes (Signed)
Pt here from nursing home with new complaints blurred vision and confusion , pt has history of brain tumor and stroke

## 2015-08-20 NOTE — ED Provider Notes (Addendum)
CSN: [id number]     Arrival date & time 08/20/15  1541 History   First MD Initiated Contact with Patient 08/20/15 1608     Chief Complaint  Patient presents with  . Weakness  . Blurred Vision     (Consider location/radiation/quality/duration/timing/severity/associated sxs/prior Treatment) HPI Comments: 81 year old female with extensive past medical history including hypertension, hyperlipidemia, IDDM, metastatic breast cancer, recent CVA with right-sided weakness who presents with dizziness and blurred vision. The patient was admitted on 9/16 and received TPA for an ischemic stroke. She was admitted again on 9/27 for reoccurrence of her stroke symptoms. At that time, she was considered high risk for ICH with repeat TPA and family declined repeat tPA. She was discharged to a nursing facility 2 days ago. Daughter is concerned that the patient isn't being monitored closely enough at the facility. She did not receive her usual insulin regimen yesterday and daughter was concerned that her blood glucose was in the 200s. Today she did receive insulin this morning. After eating lunch, the patient began saying "My head is dizzy and I think I'm going to pass out. My ears are plugged up. My vision is blurry. I feel like I'm passing out." No LOC. BP and BG were normal at that time. The patient herself describes it as a lightheadedness feeling. She denies any vertigo or sensation of moving. No chest pain or shortness of breath. No abdominal pain or vomiting. No new weakness. Currently, she states that her vision is still blurry but she denies any diplopia.  Patient is a 81 y.o. female presenting with weakness. The history is provided by a relative and the patient.  Weakness    Past Medical History  Diagnosis Date  . Hypertension   . MVP (mitral valve prolapse)   . Hypothyroidism   . Anemia   . Hypercholesterolemia   . Heart murmur   . Type II diabetes mellitus ([place])   . Arthritis     "back; knees,  hands" (05/11/2015)  . Breast cancer, right breast ([place])     "cells went into the blood and caused her hip to break" (05/11/2015)   Past Surgical History  Procedure Laterality Date  . Hemiarthroplasty hip Right     "partial replacement"  . Pelvic and para-aortic lymph node dissection    . Tonsillectomy    . Appendectomy    . Cholecystectomy    . US echocardiography  03/30/2009    EF 55-60%  . Cardiovascular stress test  01/28/2008    EF 74%  . Esophagogastroduodenoscopy N/A 07/08/2014    Procedure: ESOPHAGOGASTRODUODENOSCOPY (EGD);  Surgeon: Winfield Cunas., MD;  Location: [facility];  Service: Endoscopy;  Laterality: N/A;  . Colonoscopy N/A 07/10/2014    Procedure: COLONOSCOPY;  Surgeon: Lear Ng, MD;  Location: [facility];  Service: Endoscopy;  Laterality: N/A;  . Total abdominal hysterectomy      w/BSO  . Dilation and curettage of uterus    . Breast biopsy Right 2009  . Breast lumpectomy Right 2009  . Fracture surgery    . Cataract extraction w/ intraocular lens  implant, bilateral Bilateral    Family History  Problem Relation Age of Onset  . Hypertension Mother   . Heart attack Father   . Hypertension Father   . Diabetes Sister   . Hypertension Sister   . Liver cancer Sister   . Heart attack Brother   . Hypertension Brother   . Diabetes Brother   . Diabetes Sister   .  Hypertension Sister    Social History  Substance Use Topics  . Smoking status: Never Smoker   . Smokeless tobacco: Never Used  . Alcohol Use: No   OB History    No data available     Review of Systems  Neurological: Positive for weakness.  All other systems reviewed and are negative.     Allergies  Amlodipine; Contrast media; Hctz; Hydralazine; Metoprolol; Codeine; Demerol; Librium; and Lipitor  Home Medications   Prior to Admission medications   Medication Sig Start Date End Date Taking? Authorizing Provider  amLODipine (NORVASC) 10 MG tablet Take 1 tablet (10 mg total)  by mouth daily. 08/19/15  Yes Donzetta Starch, NP  anastrozole (ARIMIDEX) 1 MG tablet TAKE 1 TABLET BY MOUTH EVERY DAY 02/28/15  Yes Ladell Pier, MD  atenolol (TENORMIN) 50 MG tablet Take 1 tablet (50 mg total) by mouth 3 (three) times daily. 07/06/15  Yes Darlin Coco, MD  Calcium Carbonate-Vitamin D (CALCIUM + D PO) Take 1 tablet by mouth daily after lunch.    Yes Historical Provider, MD  cloNIDine (CATAPRES) 0.1 MG tablet Take 1 tablet (0.1 mg total) by mouth 2 (two) times daily. 08/02/15  Yes David L Rinehuls, PA-C  Cyanocobalamin (B-12) 500 MCG TABS Take 500 mcg by mouth daily after lunch.    Yes Historical Provider, MD  ezetimibe (ZETIA) 10 MG tablet Take 1 tablet (10 mg total) by mouth daily. 09/07/14  Yes Darlin Coco, MD  FERROCITE 324 MG TABS TAKE 1 TABLET (106 MG OF IRON TOTAL) BY MOUTH DAILY. Patient taking differently: TAKE 1 TABLET (106 MG OF IRON TOTAL) BY MOUTH DAILY AFTER LUNCH 02/28/15  Yes Ladell Pier, MD  insulin aspart (NOVOLOG) 100 UNIT/ML injection Inject 0-9 Units into the skin daily as needed for high blood sugar (only when needed, on sliding scale).   Yes Historical Provider, MD  irbesartan (AVAPRO) 300 MG tablet Take 1 tablet (300 mg total) by mouth daily. 08/19/15  Yes Donzetta Starch, NP  LANTUS SOLOSTAR 100 UNIT/ML Solostar Pen Inject 10 Units into the skin daily at 12 noon. Patient taking differently: Inject 18 Units into the skin daily at 12 noon.  08/18/15  Yes Donzetta Starch, NP  levothyroxine (SYNTHROID, LEVOTHROID) 75 MCG tablet Take 75 mcg by mouth daily before breakfast.    Yes Historical Provider, MD  polyethylene glycol (MIRALAX / GLYCOLAX) packet Take 17 g by mouth daily. 08/18/15  Yes Donzetta Starch, NP  potassium chloride (KLOR-CON M10) 10 MEQ tablet Take 1 tablet (10 mEq total) by mouth daily after lunch. 08/18/15  Yes Donzetta Starch, NP  spironolactone (ALDACTONE) 25 MG tablet Take 0.5 tablets (12.5 mg total) by mouth daily. 08/18/15  Yes Donzetta Starch, NP   cephALEXin (KEFLEX) 500 MG capsule Take 1 capsule (500 mg total) by mouth 2 (two) times daily. 08/21/15   Wenda Overland Jeylin Woodmansee, MD   BP 168/59 mmHg  Pulse 56  Temp(Src) 97.9 F (36.6 C) (Oral)  Resp 15  SpO2 95% Physical Exam  Constitutional: She is oriented to person, place, and time.  Elderly female in NAD  HENT:  Head: Normocephalic and atraumatic.  dry mucous membranes  Eyes: Conjunctivae and EOM are normal. Pupils are equal, round, and reactive to light.  Neck: Neck supple.  Cardiovascular: Normal rate, regular rhythm and normal heart sounds.   No murmur heard. Pulmonary/Chest: Effort normal and breath sounds normal. No respiratory distress.  Abdominal: Soft. Bowel sounds are normal.  She exhibits no distension. There is no tenderness.  Musculoskeletal: She exhibits no edema.  Flaccid paralysis RUE and RLE  Neurological: She is alert and oriented to person, place, and time.  Slurred speech, R lower facial droop, RUE/RLE flaccid paralysis, normal sensation throughout, EOMI  Skin: Skin is warm and dry. No rash noted.  Psychiatric: She has a normal mood and affect. Judgment normal.  Nursing note and vitals reviewed.   ED Course  Procedures (including critical care time) Labs Review Labs Reviewed  BASIC METABOLIC PANEL - Abnormal; Notable for the following:    Sodium 134 (*)    Glucose, Bld 179 (*)    Creatinine, Ser 1.05 (*)    GFR calc non Af Amer 46 (*)    GFR calc Af Amer 53 (*)    All other components within normal limits  CBC WITH DIFFERENTIAL/PLATELET - Abnormal; Notable for the following:    WBC 24.1 (*)    RBC 3.83 (*)    Hemoglobin 11.4 (*)    HCT 34.9 (*)    Neutro Abs 17.6 (*)    Lymphs Abs 6.3 (*)    All other components within normal limits  URINALYSIS, ROUTINE W REFLEX MICROSCOPIC (NOT AT [facility]) - Abnormal; Notable for the following:    Color, Urine ORANGE (*)    APPearance TURBID (*)    Hgb urine dipstick MODERATE (*)    Bilirubin Urine MODERATE (*)     Ketones, ur 15 (*)    Protein, ur 100 (*)    Urobilinogen, UA 2.0 (*)    Nitrite POSITIVE (*)    Leukocytes, UA LARGE (*)    All other components within normal limits  URINE MICROSCOPIC-ADD ON - Abnormal; Notable for the following:    Bacteria, UA MANY (*)    All other components within normal limits    Imaging Review Ct Head Wo Contrast  08/20/2015   CLINICAL DATA:  Acute onset of generalized weakness, blurred vision and confusion. Initial encounter.  EXAM: CT HEAD WITHOUT CONTRAST  TECHNIQUE: Contiguous axial images were obtained from the base of the skull through the vertex without intravenous contrast.  COMPARISON:  CT of the head performed 08/14/2015  FINDINGS: There is no evidence of acute infarction, mass lesion, or intra- or extra-axial hemorrhage on CT.  The recurrent evolving infarct in the left centrum semiovale is again noted. Prominence of the ventricles and sulci reflects mild cortical volume loss. Scattered periventricular and subcortical white matter change likely reflects small vessel ischemic microangiopathy. Cerebellar atrophy is noted. Small chronic lacunar infarcts are noted at the basal ganglia bilaterally.  The brainstem and fourth ventricle are within normal limits. The basal ganglia are unremarkable in appearance. The cerebral hemispheres demonstrate grossly normal gray-white differentiation. No mass effect or midline shift is seen.  There is no evidence of fracture; visualized osseous structures are unremarkable in appearance. The orbits are within normal limits. The paranasal sinuses and mastoid air cells are well-aerated. No significant soft tissue abnormalities are seen.  IMPRESSION: 1. No acute intracranial pathology seen on CT. 2. Evolving infarct at the left centrum semiovale again noted. 3. Mild cortical volume loss and scattered small vessel ischemic microangiopathy. 4. Small chronic lacunar infarcts at the basal ganglia bilaterally.   Electronically Signed   By:  Garald Balding M.D.   On: 08/20/2015 17:54   Dg Chest Port 1 View  08/20/2015   CLINICAL DATA:  81 year old with acute onset of blurred vision, generalized weakness and confusion earlier today  while at the nursing home.  EXAM: PORTABLE CHEST 1 VIEW  COMPARISON:  08/10/2015 and earlier.  FINDINGS: Cardiac silhouette moderately enlarged, unchanged. Linear atelectasis involving the left lung base. Lungs otherwise clear. No localized airspace consolidation. No pleural effusions. No pneumothorax. Normal pulmonary vascularity.  IMPRESSION: Stable cardiomegaly. Linear atelectasis involving the left lung base. No acute cardiopulmonary disease otherwise.   Electronically Signed   By: Evangeline Dakin M.D.   On: 08/20/2015 17:39   I have personally reviewed and evaluated these lab results as part of my medical decision-making.   EKG Interpretation   Date/Time:  Saturday August 20 2015 15:45:35 EDT Ventricular Rate:  63 PR Interval:  173 QRS Duration: 157 QT Interval:  489 QTC Calculation: 476 R Axis:   -17 Text Interpretation:  Sinus rhythm Right bundle branch block LVH with IVCD  and secondary repol abnrm No significant change since last tracing  Confirmed by Aspynn Clover MD, Gannon Heinzman [phone]) on 08/20/2015 3:51:56 PM      MDM   Final diagnoses:  Urinary tract infection associated with catheterization of urinary tract, initial encounter  Blurry vision   recent ischemic stroke  81 year old female with multiple medical problems including recent ischemic stroke with TPA and September who presents with sudden onset of blurry vision and lightheadedness that began after lunch. On arrival, patient was awake, alert, comfortable and in no acute distress. She had slurred speech, right facial and right hemi-paresis which daughter states is baseline since discharge. VS notable for HR 55, BP 798X systolic. Obtained EKG which was unchanged from previous. Sent above labs and obtained CXR and head CT to evaluate for  infectious or metabolic cause of sx.   Labwork shows creatinine of 1.05, WBC 24,000 which is consistent with previous recent values. I suspect that her leukocytosis may be related to recent stroke as it was elevated during recent hospitalization. UA shows large leukocytes, positive nitrites, many bacteria and WBCs consistent with infection. CXR with no acute process. CT shows no acute findings; evolving known infarct with old lacunar infarcts.  Gave the patient an IV fluid bolus and ceftriaxone. Urine culture sent. I discussed lab findings w/ patient and daughter. They state the patient has a history of recurrent urinary tract infections. I reviewed recent culture results which show Escherichia coli sensitive to cephalosporins. I have emphasized importance of urology follow-up for evaluation of frequent UTIs as well as for urinary retention which is why the patient was sent home with a Foley catheter.  Patient has remained stable in the emergency department with no new neurologic deficits. On reexamination, the patient was resting comfortably. She denies any vertigo symptoms to suggest cerebellar process. Pt is currently optimized on therapy to reduce risk of future stroke and given recent decline of tPA by family at last hospitalization, I do not feel that another brain MRI would provide any further information at this time. I had several discussions with the family regarding their concerns for patient's current nursing facility. Unfortunately, there is no case manager available at this time for assistance. I have encouraged them to all her closely with the nursing facility social worker to discuss other options. Patient requested admission, however I am concerned about her significant risk for hospital associated infections given her advanced age, multiple comorbidities, and multiple recent hospitalizations. She has no fever or vital sign instability to suggest serious infection and I feel that she can be  safely treated as an outpatient for her urinary tract infection. Daughter concerned about patient's hypertension;  gave the patient her home dose of medications and instructed to follow with PCP for BP medication reevaluation. Gave the patient a prescription for Keflex and urology follow-up information. Patient discharged in satisfactory condition.  Sharlett Iles, MD 08/21/15 7619  Sharlett Iles, MD 08/21/15 [phone]

## 2015-08-21 MED ORDER — ONDANSETRON HCL 4 MG/2ML IJ SOLN
4.0000 mg | Freq: Once | INTRAMUSCULAR | Status: AC
  Administered 2015-08-21: 4 mg via INTRAVENOUS
  Filled 2015-08-21: qty 2

## 2015-08-21 MED ORDER — CEPHALEXIN 500 MG PO CAPS: 500.0000 mg | ORAL_CAPSULE | Freq: Two times a day (BID) | ORAL | Status: DC

## 2015-08-21 NOTE — ED Notes (Signed)
Pt transferred to [facility] stretcher   

## 2015-08-23 LAB — PATHOLOGIST SMEAR REVIEW

## 2015-08-24 ENCOUNTER — Other Ambulatory Visit: Payer: Self-pay | Admitting: Licensed Clinical Social Worker

## 2015-08-24 NOTE — Patient Outreach (Signed)
[name]) Care Management  [name] Social Work  08/24/2015  Molly [name] 1923/02/16 [id number]  Current Medications:  Current Outpatient Prescriptions  Medication Sig Dispense Refill  . amLODipine (NORVASC) 10 MG tablet Take 1 tablet (10 mg total) by mouth daily. 30 tablet 2  . anastrozole (ARIMIDEX) 1 MG tablet TAKE 1 TABLET BY MOUTH EVERY DAY 30 tablet 5  . atenolol (TENORMIN) 50 MG tablet Take 1 tablet (50 mg total) by mouth 3 (three) times daily. 90 tablet 5  . Calcium Carbonate-Vitamin D (CALCIUM + D PO) Take 1 tablet by mouth daily after lunch.     . cephALEXin (KEFLEX) 500 MG capsule Take 1 capsule (500 mg total) by mouth 2 (two) times daily. 14 capsule 0  . cloNIDine (CATAPRES) 0.1 MG tablet Take 1 tablet (0.1 mg total) by mouth 2 (two) times daily. 60 tablet 3  . Cyanocobalamin (B-12) 500 MCG TABS Take 500 mcg by mouth daily after lunch.     . ezetimibe (ZETIA) 10 MG tablet Take 1 tablet (10 mg total) by mouth daily. 30 tablet 11  . FERROCITE 324 MG TABS TAKE 1 TABLET (106 MG OF IRON TOTAL) BY MOUTH DAILY. (Patient taking differently: TAKE 1 TABLET (106 MG OF IRON TOTAL) BY MOUTH DAILY AFTER LUNCH) 60 tablet 5  . insulin aspart (NOVOLOG) 100 UNIT/ML injection Inject 0-9 Units into the skin daily as needed for high blood sugar (only when needed, on sliding scale).    . irbesartan (AVAPRO) 300 MG tablet Take 1 tablet (300 mg total) by mouth daily. 30 tablet 2  . LANTUS SOLOSTAR 100 UNIT/ML Solostar Pen Inject 10 Units into the skin daily at 12 noon. (Patient taking differently: Inject 18 Units into the skin daily at 12 noon. ) 15 mL 5  . levothyroxine (SYNTHROID, LEVOTHROID) 75 MCG tablet Take 75 mcg by mouth daily before breakfast.     . polyethylene glycol (MIRALAX / GLYCOLAX) packet Take 17 g by mouth daily. 14 each 0  . potassium chloride (KLOR-CON M10) 10 MEQ tablet Take 1 tablet (10 mEq total) by mouth daily after lunch. 30 tablet 11  . spironolactone  (ALDACTONE) 25 MG tablet Take 0.5 tablets (12.5 mg total) by mouth daily. 15 tablet 3   No current facility-administered medications for this visit.    Functional Status:  In your present state of health, do you have any difficulty performing the following activities: 08/10/2015 08/08/2015  Hearing? N N  Vision? N N  Difficulty concentrating or making decisions? N N  Walking or climbing stairs? Y Y  Dressing or bathing? Y N  Doing errands, shopping? [name]  Preparing Food and eating ? - Y  Using the Toilet? - Y  In the past six months, have you accidently leaked urine? - N  Do you have problems with loss of bowel control? - N  Managing your Medications? - Y  Managing your Finances? - Y  Housekeeping or managing your Housekeeping? - Y    Fall/Depression Screening:  PHQ 2/9 Scores 08/08/2015  PHQ - 2 Score 0    Assessment: CSW completed initial visit at [name] on 08/24/15. Patient's daughter and sister were in the room as well. Patient's daughter Molly [name] is the primary caregiver to patient and is her POA. Molly [name] has been very disappointed in the care her mother is receiving at the [name]. Molly [name] shares that the staff aren't as helpful as she would have hoped. CSW questioned if she has expressed her  concerns to staff and she has declined but shares that she plans to do so. Molly [name] reports that she has been out of work for many weeks and has used all of her sick and vacation time and "will probably be fired soon but I can't leave her here by herself everyday." Molly [name] comes to the [name] everyday even staying on certain nights. Patient's sister comes to visit her at the [name] 2-3 times per week. Patient has a strong support network. Patient's pastor came to visit during the assessment who comes often. Molly [name] denies being interested in her mother applying for Medicaid as Molly [name] reports that patient has too much many savings and would have to spend out those savings. Molly [name] shares that patient has her  own home in her name, car in her name and land in her name. Patient's daughter stated that patient is not willing to spend any of her savings either. Patient is able to state name, date of birth and address. Patient reports that she wishes to come home and does not wish to be at the [name]. [facility] [name] shared that she is fearful of staff taking her to physical therapy as they do not know how to take care of her appropriately and "do nothing but stand there." Molly [name] shares that she does not know when patient will discharge but that she knows that she will be unable to provide care to her. Molly [name] was encouraged to try to talk to staff or social worker to collaborate in receiving the best care. Molly [name] shared that she would consider this. Patient's daughter reports that she would need ongoing care within the home once she is able to come home. CSW provided a list of personal care resources within the area. Molly [name] is aware that Medicare will not pay for personal care services. CSW questioned on transportation for patient and Molly [name] shares that she does not have stable transportation but would rather focus on transportation resources once she is out of [name]. Molly [name] agreeable to [name] services and working with CSW to ensure a stable and safe discharge home with the resources she needs.   CSW met with the social worker and discharge planner at [name] after visit to gather information. Social worker shared that patient's daughter has spoke to Education officer, museum, Music therapist, nurses on [employer], Mudlogger of the [name] and to discharge planner about her concerns. Social worker shares that patient's daughter wishes for patient to stay in bed , to not do any therapy and to not feed herself. Social worker shares that patient can feed herself and does not need assistance. Social worker expresses concerns for patient not gaining enough strength to leave [name] appropriately as patient's daughter at times has taken her out of physical  therapy. Social worker shares that they will have a discharge planning next week to discuss concerns and plans for discharge which can help improve communication between [name] and family. CSW provided contact information for updates.  Plan-CSW will remain available to patient and will assist with resources needed once date of discharge has been arranged.  Eula Fried, BSW, MSW, [email or web address] Phone: [phone] Fax: [phone]

## 2015-08-29 ENCOUNTER — Observation Stay (HOSPITAL_COMMUNITY)
Admission: EM | Admit: 2015-08-29 | Discharge: 2015-09-02 | Disposition: A | Discharge status: Skilled Nursing Facility | Payer: Medicare Other | Attending: Internal Medicine | Admitting: Internal Medicine

## 2015-08-29 ENCOUNTER — Ambulatory Visit (INDEPENDENT_AMBULATORY_CARE_PROVIDER_SITE_OTHER): Payer: Medicare Other | Admitting: Neurology

## 2015-08-29 ENCOUNTER — Emergency Department (HOSPITAL_COMMUNITY): Payer: Medicare Other

## 2015-08-29 ENCOUNTER — Encounter: Payer: Self-pay | Admitting: Neurology

## 2015-08-29 ENCOUNTER — Telehealth: Payer: Self-pay | Admitting: Neurology

## 2015-08-29 VITALS — BP 122/64 | HR 49 | Ht 64.0 in | Wt 179.0 lb

## 2015-08-29 DIAGNOSIS — L89153 Pressure ulcer of sacral region, stage 3: Secondary | ICD-10-CM | POA: Diagnosis not present

## 2015-08-29 DIAGNOSIS — C50919 Malignant neoplasm of unspecified site of unspecified female breast: Secondary | ICD-10-CM | POA: Diagnosis present

## 2015-08-29 DIAGNOSIS — C7951 Secondary malignant neoplasm of bone: Secondary | ICD-10-CM | POA: Diagnosis not present

## 2015-08-29 DIAGNOSIS — Z87440 Personal history of urinary (tract) infections: Secondary | ICD-10-CM | POA: Diagnosis not present

## 2015-08-29 DIAGNOSIS — Z794 Long term (current) use of insulin: Secondary | ICD-10-CM | POA: Insufficient documentation

## 2015-08-29 DIAGNOSIS — R55 Syncope and collapse: Principal | ICD-10-CM | POA: Insufficient documentation

## 2015-08-29 DIAGNOSIS — I951 Orthostatic hypotension: Secondary | ICD-10-CM

## 2015-08-29 DIAGNOSIS — Z7902 Long term (current) use of antithrombotics/antiplatelets: Secondary | ICD-10-CM | POA: Insufficient documentation

## 2015-08-29 DIAGNOSIS — E1142 Type 2 diabetes mellitus with diabetic polyneuropathy: Secondary | ICD-10-CM | POA: Insufficient documentation

## 2015-08-29 DIAGNOSIS — Z66 Do not resuscitate: Secondary | ICD-10-CM | POA: Diagnosis not present

## 2015-08-29 DIAGNOSIS — I350 Nonrheumatic aortic (valve) stenosis: Secondary | ICD-10-CM | POA: Diagnosis not present

## 2015-08-29 DIAGNOSIS — E039 Hypothyroidism, unspecified: Secondary | ICD-10-CM | POA: Diagnosis not present

## 2015-08-29 DIAGNOSIS — I69392 Facial weakness following cerebral infarction: Secondary | ICD-10-CM | POA: Diagnosis not present

## 2015-08-29 DIAGNOSIS — N390 Urinary tract infection, site not specified: Secondary | ICD-10-CM | POA: Insufficient documentation

## 2015-08-29 DIAGNOSIS — I10 Essential (primary) hypertension: Secondary | ICD-10-CM | POA: Insufficient documentation

## 2015-08-29 DIAGNOSIS — I69322 Dysarthria following cerebral infarction: Secondary | ICD-10-CM | POA: Insufficient documentation

## 2015-08-29 DIAGNOSIS — C50911 Malignant neoplasm of unspecified site of right female breast: Secondary | ICD-10-CM | POA: Diagnosis not present

## 2015-08-29 DIAGNOSIS — I63232 Cerebral infarction due to unspecified occlusion or stenosis of left carotid arteries: Secondary | ICD-10-CM | POA: Diagnosis not present

## 2015-08-29 DIAGNOSIS — E785 Hyperlipidemia, unspecified: Secondary | ICD-10-CM | POA: Insufficient documentation

## 2015-08-29 DIAGNOSIS — R001 Bradycardia, unspecified: Secondary | ICD-10-CM | POA: Insufficient documentation

## 2015-08-29 DIAGNOSIS — I69351 Hemiplegia and hemiparesis following cerebral infarction affecting right dominant side: Secondary | ICD-10-CM | POA: Insufficient documentation

## 2015-08-29 DIAGNOSIS — C9110 Chronic lymphocytic leukemia of B-cell type not having achieved remission: Secondary | ICD-10-CM | POA: Insufficient documentation

## 2015-08-29 DIAGNOSIS — I6523 Occlusion and stenosis of bilateral carotid arteries: Secondary | ICD-10-CM | POA: Insufficient documentation

## 2015-08-29 DIAGNOSIS — Z79899 Other long term (current) drug therapy: Secondary | ICD-10-CM | POA: Diagnosis not present

## 2015-08-29 DIAGNOSIS — I63032 Cerebral infarction due to thrombosis of left carotid artery: Secondary | ICD-10-CM | POA: Diagnosis not present

## 2015-08-29 DIAGNOSIS — R011 Cardiac murmur, unspecified: Secondary | ICD-10-CM | POA: Diagnosis not present

## 2015-08-29 DIAGNOSIS — Z96641 Presence of right artificial hip joint: Secondary | ICD-10-CM | POA: Diagnosis not present

## 2015-08-29 HISTORY — DX: Personal history of other diseases of the digestive system: Z87.19

## 2015-08-29 HISTORY — DX: Cerebrovascular disease, unspecified: I67.9

## 2015-08-29 HISTORY — DX: Cerebral infarction, unspecified: I63.9

## 2015-08-29 HISTORY — DX: Other specified symptoms and signs involving the circulatory and respiratory systems: R09.89

## 2015-08-29 HISTORY — DX: Nonrheumatic aortic (valve) stenosis: I35.0

## 2015-08-29 LAB — CBC WITH DIFFERENTIAL/PLATELET
Basophils Absolute: 0 K/uL (ref 0.0–0.1)
Basophils Relative: 0 %
Eosinophils Absolute: 0.2 K/uL (ref 0.0–0.7)
Eosinophils Relative: 1 %
HCT: 43.1 % (ref 36.0–46.0)
Hemoglobin: 14.4 g/dL (ref 12.0–15.0)
Lymphocytes Relative: 41 %
Lymphs Abs: 9.8 K/uL — ABNORMAL HIGH (ref 0.7–4.0)
MCH: 30.9 pg (ref 26.0–34.0)
MCHC: 33.4 g/dL (ref 30.0–36.0)
MCV: 92.5 fL (ref 78.0–100.0)
Monocytes Absolute: 0.7 K/uL (ref 0.1–1.0)
Monocytes Relative: 3 %
Neutro Abs: 13.2 K/uL — ABNORMAL HIGH (ref 1.7–7.7)
Neutrophils Relative %: 55 %
Platelets: ADEQUATE K/uL (ref 150–400)
RBC: 4.66 MIL/uL (ref 3.87–5.11)
RDW: 14.7 % (ref 11.5–15.5)
WBC: 23.9 K/uL — ABNORMAL HIGH (ref 4.0–10.5)

## 2015-08-29 LAB — BASIC METABOLIC PANEL WITH GFR
Anion gap: 12 (ref 5–15)
BUN: 17 mg/dL (ref 6–20)
CO2: 23 mmol/L (ref 22–32)
Calcium: 9.9 mg/dL (ref 8.9–10.3)
Chloride: 101 mmol/L (ref 101–111)
Creatinine, Ser: 0.91 mg/dL (ref 0.44–1.00)
GFR calc Af Amer: >60 mL/min
GFR calc non Af Amer: 54 mL/min — ABNORMAL LOW
Glucose, Bld: 170 mg/dL — ABNORMAL HIGH (ref 65–99)
Potassium: 4.1 mmol/L (ref 3.5–5.1)
Sodium: 136 mmol/L (ref 135–145)

## 2015-08-29 LAB — GLUCOSE, CAPILLARY
GLUCOSE-CAPILLARY: 153 mg/dL — AB (ref 65–99)
GLUCOSE-CAPILLARY: 148 mg/dL — AB (ref 65–99)

## 2015-08-29 LAB — I-STAT TROPONIN, ED: Troponin i, poc: 0.01 ng/mL (ref 0.00–0.08)

## 2015-08-29 LAB — TROPONIN I

## 2015-08-29 LAB — URINE MICROSCOPIC-ADD ON

## 2015-08-29 LAB — URINALYSIS, ROUTINE W REFLEX MICROSCOPIC
Bilirubin Urine: NEGATIVE
Glucose, UA: NEGATIVE mg/dL
Hgb urine dipstick: NEGATIVE
Ketones, ur: NEGATIVE mg/dL
Nitrite: NEGATIVE
Protein, ur: NEGATIVE mg/dL
Specific Gravity, Urine: 1.01 (ref 1.005–1.030)
Urobilinogen, UA: 0.2 mg/dL (ref 0.0–1.0)
pH: 6.5 (ref 5.0–8.0)

## 2015-08-29 LAB — MRSA PCR SCREENING: MRSA BY PCR: NEGATIVE

## 2015-08-29 MED ORDER — DEXTROSE 5 % IV SOLN
1.0000 g | Freq: Once | INTRAVENOUS | Status: AC
  Administered 2015-08-29: 1 g via INTRAVENOUS
  Filled 2015-08-29: qty 10

## 2015-08-29 MED ORDER — VITAMIN B-12 1000 MCG PO TABS
500.0000 ug | ORAL_TABLET | Freq: Every day | ORAL | Status: DC
  Administered 2015-08-30 – 2015-09-02 (×4): 500 ug via ORAL
  Filled 2015-08-29 (×4): qty 1

## 2015-08-29 MED ORDER — INSULIN ASPART 100 UNIT/ML ~~LOC~~ SOLN
0.0000 [IU] | Freq: Three times a day (TID) | SUBCUTANEOUS | Status: DC
  Administered 2015-08-29 – 2015-08-30 (×3): 2 [IU] via SUBCUTANEOUS
  Administered 2015-08-30: 1 [IU] via SUBCUTANEOUS
  Administered 2015-08-31: 2 [IU] via SUBCUTANEOUS
  Administered 2015-08-31: 3 [IU] via SUBCUTANEOUS
  Administered 2015-08-31: 2 [IU] via SUBCUTANEOUS
  Administered 2015-09-01: 3 [IU] via SUBCUTANEOUS
  Administered 2015-09-01 – 2015-09-02 (×3): 2 [IU] via SUBCUTANEOUS
  Administered 2015-09-02: 3 [IU] via SUBCUTANEOUS

## 2015-08-29 MED ORDER — ALUM & MAG HYDROXIDE-SIMETH 200-200-20 MG/5ML PO SUSP: 30.0000 mL | Freq: Four times a day (QID) | ORAL | Status: DC | PRN

## 2015-08-29 MED ORDER — POLYETHYLENE GLYCOL 3350 17 G PO PACK: 17.0000 g | PACK | Freq: Every day | ORAL | Status: DC | PRN

## 2015-08-29 MED ORDER — ANASTROZOLE 1 MG PO TABS
1.0000 mg | ORAL_TABLET | Freq: Every day | ORAL | Status: DC
  Administered 2015-08-29 – 2015-09-02 (×5): 1 mg via ORAL
  Filled 2015-08-29 (×5): qty 1

## 2015-08-29 MED ORDER — IRBESARTAN 300 MG PO TABS
300.0000 mg | ORAL_TABLET | Freq: Every day | ORAL | Status: DC
  Administered 2015-08-29 – 2015-09-02 (×5): 300 mg via ORAL
  Filled 2015-08-29 (×5): qty 1

## 2015-08-29 MED ORDER — SULFAMETHOXAZOLE-TRIMETHOPRIM 800-160 MG PO TABS
1.0000 | ORAL_TABLET | Freq: Two times a day (BID) | ORAL | Status: AC
  Administered 2015-08-29 – 2015-09-01 (×6): 1 via ORAL
  Filled 2015-08-29 (×6): qty 1

## 2015-08-29 MED ORDER — SODIUM CHLORIDE 0.9 % IJ SOLN: 3.0000 mL | INTRAMUSCULAR | Status: DC | PRN

## 2015-08-29 MED ORDER — ENOXAPARIN SODIUM 40 MG/0.4ML ~~LOC~~ SOLN
40.0000 mg | SUBCUTANEOUS | Status: DC
  Administered 2015-08-29 – 2015-09-01 (×4): 40 mg via SUBCUTANEOUS
  Filled 2015-08-29 (×4): qty 0.4

## 2015-08-29 MED ORDER — HYDRALAZINE HCL 20 MG/ML IJ SOLN
2.0000 mg | Freq: Four times a day (QID) | INTRAMUSCULAR | Status: DC | PRN
  Filled 2015-08-29 (×2): qty 1

## 2015-08-29 MED ORDER — SODIUM CHLORIDE 0.9 % IV SOLN: 250.0000 mL | INTRAVENOUS | Status: DC | PRN

## 2015-08-29 MED ORDER — ONDANSETRON HCL 4 MG PO TABS: 4.0000 mg | ORAL_TABLET | Freq: Four times a day (QID) | ORAL | Status: DC | PRN

## 2015-08-29 MED ORDER — SODIUM CHLORIDE 0.9 % IJ SOLN
3.0000 mL | Freq: Two times a day (BID) | INTRAMUSCULAR | Status: DC
  Administered 2015-08-29 – 2015-09-01 (×5): 3 mL via INTRAVENOUS

## 2015-08-29 MED ORDER — LEVOTHYROXINE SODIUM 75 MCG PO TABS
75.0000 ug | ORAL_TABLET | Freq: Every day | ORAL | Status: DC
  Administered 2015-08-30 – 2015-09-02 (×4): 75 ug via ORAL
  Filled 2015-08-29 (×4): qty 1

## 2015-08-29 MED ORDER — POLYETHYLENE GLYCOL 3350 17 G PO PACK
17.0000 g | PACK | Freq: Every day | ORAL | Status: DC
  Administered 2015-08-30 – 2015-09-01 (×3): 17 g via ORAL
  Filled 2015-08-29 (×5): qty 1

## 2015-08-29 MED ORDER — ACETAMINOPHEN 650 MG RE SUPP
650.0000 mg | Freq: Four times a day (QID) | RECTAL | Status: DC | PRN
Start: 2015-08-29 — End: 2015-09-02

## 2015-08-29 MED ORDER — SODIUM CHLORIDE 0.9 % IV BOLUS (SEPSIS)
1000.0000 mL | Freq: Once | INTRAVENOUS | Status: AC
  Administered 2015-08-29: 1000 mL via INTRAVENOUS

## 2015-08-29 MED ORDER — B-12 500 MCG PO TABS: 500.0000 ug | ORAL_TABLET | Freq: Every day | ORAL | Status: DC

## 2015-08-29 MED ORDER — ACETAMINOPHEN 325 MG PO TABS: 650.0000 mg | ORAL_TABLET | Freq: Four times a day (QID) | ORAL | Status: DC | PRN

## 2015-08-29 MED ORDER — AMLODIPINE BESYLATE 10 MG PO TABS
10.0000 mg | ORAL_TABLET | Freq: Every day | ORAL | Status: DC
  Administered 2015-08-29 – 2015-09-02 (×5): 10 mg via ORAL
  Filled 2015-08-29 (×5): qty 1

## 2015-08-29 MED ORDER — EZETIMIBE 10 MG PO TABS
10.0000 mg | ORAL_TABLET | Freq: Every day | ORAL | Status: DC
  Administered 2015-08-29 – 2015-09-01 (×4): 10 mg via ORAL
  Filled 2015-08-29 (×4): qty 1

## 2015-08-29 MED ORDER — ATENOLOL 50 MG PO TABS
50.0000 mg | ORAL_TABLET | Freq: Three times a day (TID) | ORAL | Status: DC
  Filled 2015-08-29 (×4): qty 1

## 2015-08-29 MED ORDER — CLOPIDOGREL BISULFATE 75 MG PO TABS
75.0000 mg | ORAL_TABLET | Freq: Every day | ORAL | Status: DC
  Administered 2015-08-30 – 2015-09-02 (×4): 75 mg via ORAL
  Filled 2015-08-29 (×5): qty 1

## 2015-08-29 MED ORDER — INSULIN GLARGINE 100 UNIT/ML ~~LOC~~ SOLN
10.0000 [IU] | Freq: Every day | SUBCUTANEOUS | Status: DC
  Administered 2015-08-29 – 2015-09-02 (×5): 10 [IU] via SUBCUTANEOUS
  Filled 2015-08-29 (×5): qty 0.1

## 2015-08-29 MED ORDER — ONDANSETRON HCL 4 MG/2ML IJ SOLN: 4.0000 mg | Freq: Four times a day (QID) | INTRAMUSCULAR | Status: DC | PRN

## 2015-08-29 MED ORDER — SODIUM CHLORIDE 0.9 % IJ SOLN
3.0000 mL | Freq: Two times a day (BID) | INTRAMUSCULAR | Status: DC
  Administered 2015-08-31 – 2015-09-01 (×2): 3 mL via INTRAVENOUS

## 2015-08-29 NOTE — Progress Notes (Signed)
ANTIBIOTIC CONSULT NOTE - INITIAL  Pharmacy Consult for Bactrim Indication: UTI  Allergies  Allergen Reactions  . Amlodipine Swelling    Currently taking exforge, separate med-Amlodipiine caused lower leg edema  . Contrast Media [Iodinated Diagnostic Agents] Nausea Only and Other (See Comments)    Patient experienced chills, nausea, hypothermic symptoms, also weird feelings  . Hctz [Hydrochlorothiazide] Other (See Comments)    Severe hypotension  . Hydralazine Other (See Comments)    Severe hypotension-suddenly and drastically dropped BP  . Metoprolol Other (See Comments)    Severe hypotension-suddenly and drastically dropped BP, same as hydralazine  . Codeine Other (See Comments)    Makes her feel "crazy."  . Demerol Other (See Comments)    Makes her feel "crazy."  . Librium Other (See Comments)    Makes her feel "crazy."  . Lipitor [Atorvastatin Calcium] Other (See Comments)    Makes her feel "crazy."    Patient Measurements: Height: 5\' 4"  (162.6 cm) Weight: 152 lb 8 oz (69.174 kg) IBW/kg (Calculated) : 54.7  Vital Signs: Temp: 97.9 F (36.6 C) (10/17 1046) Temp Source: Oral (10/17 1046) BP: 204/56 mmHg (10/17 1611) Pulse Rate: 55 (10/17 1611) Intake/Output from previous day:   Intake/Output from this shift:    Labs:  Recent Labs  08/29/15 1132  WBC 23.9*  HGB 14.4  PLT PLATELET CLUMPS NOTED ON SMEAR, COUNT APPEARS ADEQUATE  CREATININE 0.91   Estimated Creatinine Clearance: 39.2 mL/min (by C-G formula based on Cr of 0.91). No results for input(s): VANCOTROUGH, VANCOPEAK, VANCORANDOM, GENTTROUGH, GENTPEAK, GENTRANDOM, TOBRATROUGH, TOBRAPEAK, TOBRARND, AMIKACINPEAK, AMIKACINTROU, AMIKACIN in the last 72 hours.   Microbiology: No results found for this or any previous visit (from the past 720 hour(s)).  Medical History: Past Medical History  Diagnosis Date  . Hypertension   . MVP (mitral valve prolapse)   . Hypothyroidism   . Anemia   .  Hypercholesterolemia   . Heart murmur   . Type II diabetes mellitus ([place])   . Arthritis     "back; knees, hands" (05/11/2015)  . Breast cancer, right breast ([place])     "cells went into the blood and caused her hip to break" (05/11/2015)    Assessment: 81 yo F presents on 10/17 with near syncope. UA showed many bacteria and moderate leukocytes. Pharmacy consulted to dose Bactrim for UTI. Urine cx sent. Afebrile, WBC elevated at 23.9. SCr stable, CrCl ~31ml/min.  Goal of Therapy:  Resolution of infection  Plan:  Start Bactrim 1 DS tablet BID x 3 days Monitor clinical picture F/U C&S  Leisha Trinkle J 08/29/2015,4:43 PM

## 2015-08-29 NOTE — ED Notes (Signed)
Patient was seen in the ED for a UTI on 08/20/15 and discharged back to [facility]. She completed her abx for the UTI on Saturday night.

## 2015-08-29 NOTE — Consult Note (Signed)
CARDIOLOGY CONSULT NOTE   Patient ID: Molly [name] MRN: [id number] DOB/AGE: Oct 04, 1923 81 y.o.  Admit date: 08/29/2015  Primary Physician   Antony Blackbird, MD Primary Cardiologist   Dr. Mare Ferrari  Reason for Consultation  Labile BP   HPI: Molly [name] is a 81 y.o. female with a history of labile HTN, HLD, right-sided hemiplegia s/p CVA, recent diverticular bleed, DM, severe bilateral intracranial carotid stenosis, breast cancer with metastasis to bone, and mild aortic stenosis who was sent to the [facility] ED from Dr. Phoebe Sharps neurology office with chest pain after a syncopal episode.   In brief,Molly [name] had a stroke in September 2016 and received TPA. She was put on dual antiplatelet and resumed zetia, discharged with [facility]. She was readmitted in October with progression of the stroke and while in the hospital had a second stroke. MRA head and neck showed diffuse intracranial stenosis including left cavernous ICA high grade stenosis. During the same hospitalization she had a diverticular bleed. She developed very labile blood pressures.Her strokes felt to be due to hypoperfusion with low BP in the setting of intracranial carotid stenosis. She was sent to SNF after stabilization. However, she was not on antiplatelet in SNF and she was on 4 different BP meds. Since discharge she has had 2 syncopal episodes at the SNF. She had a third one today in the neurology office. After her syncopal episode she developed chest pain.  The patient says she has similar chest pain whenever she takes several pills at the same time. Today's chest pain is different because it has not resolved over the last several hours. It is located just left of her sternum and does not radiate it is dull in nature.  She does not have any history of ischemic heart disease and she had a normal nuclear stress test in 2009. In March she had another radioisotope bone scan which showed multiple areas of metastasis from her  breast cancer.This was done on 02/04/15. She was last seen by Dr. Mare Ferrari in 02/2015 for regular cardiology follow up. It was noted that she did not do well on amlodipine due to LE swelling. Most recent ECHO was 07/2015 for CVA. This found EF 65-70%, G1DD, no RWMA, mild AS,  Past Medical History  Diagnosis Date  . Hypertension   . MVP (mitral valve prolapse)   . Hypothyroidism   . Anemia   . Hypercholesterolemia   . Heart murmur   . Type II diabetes mellitus ([place])   . Arthritis     "back; knees, hands" (05/11/2015)  . Breast cancer, right breast ([place])     "cells went into the blood and caused her hip to break" (05/11/2015)     Past Surgical History  Procedure Laterality Date  . Hemiarthroplasty hip Right     "partial replacement"  . Pelvic and para-aortic lymph node dissection    . Tonsillectomy    . Appendectomy    . Cholecystectomy    . US echocardiography  03/30/2009    EF 55-60%  . Cardiovascular stress test  01/28/2008    EF 74%  . Esophagogastroduodenoscopy N/A 07/08/2014    Procedure: ESOPHAGOGASTRODUODENOSCOPY (EGD);  Surgeon: Winfield Cunas., MD;  Location: [facility];  Service: Endoscopy;  Laterality: N/A;  . Colonoscopy N/A 07/10/2014    Procedure: COLONOSCOPY;  Surgeon: Lear Ng, MD;  Location: [facility] ENDOSCOPY;  Service: Endoscopy;  Laterality: N/A;  . Total abdominal hysterectomy      w/BSO  .  Dilation and curettage of uterus    . Breast biopsy Right 2009  . Breast lumpectomy Right 2009  . Fracture surgery    . Cataract extraction w/ intraocular lens  implant, bilateral Bilateral     Allergies  Allergen Reactions  . Amlodipine Swelling    Currently taking exforge, separate med-Amlodipiine caused lower leg edema  . Contrast Media [Iodinated Diagnostic Agents] Nausea Only and Other (See Comments)    Patient experienced chills, nausea, hypothermic symptoms, also weird feelings  . Hctz [Hydrochlorothiazide] Other (See Comments)    Severe hypotension    . Hydralazine Other (See Comments)    Severe hypotension-suddenly and drastically dropped BP  . Metoprolol Other (See Comments)    Severe hypotension-suddenly and drastically dropped BP, same as hydralazine  . Codeine Other (See Comments)    Makes her feel "crazy."  . Demerol Other (See Comments)    Makes her feel "crazy."  . Librium Other (See Comments)    Makes her feel "crazy."  . Lipitor [Atorvastatin Calcium] Other (See Comments)    Makes her feel "crazy."    I have reviewed the patient's current medications     Prior to Admission medications   Medication Sig Start Date End Date Taking? Authorizing Provider  ACCU-CHEK SMARTVIEW test strip  06/13/15  Yes Historical Provider, MD  amLODipine (NORVASC) 10 MG tablet Take 1 tablet (10 mg total) by mouth daily. 08/19/15  Yes Donzetta Starch, NP  anastrozole (ARIMIDEX) 1 MG tablet TAKE 1 TABLET BY MOUTH EVERY DAY 02/28/15  Yes Ladell Pier, MD  atenolol (TENORMIN) 50 MG tablet Take 1 tablet (50 mg total) by mouth 3 (three) times daily. 07/06/15  Yes Darlin Coco, MD  Calcium Carbonate-Vitamin D (CALCIUM + D PO) Take 1 tablet by mouth daily after lunch.    Yes Historical Provider, MD  cephALEXin (KEFLEX) 500 MG capsule Take 500 mg by mouth 2 (two) times daily.   Yes Historical Provider, MD  cloNIDine (CATAPRES) 0.1 MG tablet Take 1 tablet (0.1 mg total) by mouth 2 (two) times daily. 08/02/15  Yes David L Rinehuls, PA-C  Cyanocobalamin (B-12) 500 MCG TABS Take 500 mcg by mouth daily after lunch.    Yes Historical Provider, MD  ezetimibe (ZETIA) 10 MG tablet Take 1 tablet (10 mg total) by mouth daily. 09/07/14  Yes Darlin Coco, MD  FERROCITE 324 MG TABS TAKE 1 TABLET (106 MG OF IRON TOTAL) BY MOUTH DAILY. Patient taking differently: TAKE 1 TABLET (106 MG OF IRON TOTAL) BY MOUTH DAILY AFTER LUNCH 02/28/15  Yes Ladell Pier, MD  irbesartan (AVAPRO) 300 MG tablet Take 1 tablet (300 mg total) by mouth daily. 08/19/15  Yes Donzetta Starch,  NP  LANTUS SOLOSTAR 100 UNIT/ML Solostar Pen Inject 10 Units into the skin daily at 12 noon. Patient taking differently: Inject 18 Units into the skin daily at 12 noon.  08/18/15  Yes Donzetta Starch, NP  levothyroxine (SYNTHROID, LEVOTHROID) 75 MCG tablet Take 75 mcg by mouth daily before breakfast.    Yes Historical Provider, MD  polyethylene glycol (MIRALAX / GLYCOLAX) packet Take 17 g by mouth daily. 08/18/15  Yes Donzetta Starch, NP  potassium chloride (KLOR-CON M10) 10 MEQ tablet Take 1 tablet (10 mEq total) by mouth daily after lunch. 08/18/15  Yes Donzetta Starch, NP  spironolactone (ALDACTONE) 25 MG tablet Take 0.5 tablets (12.5 mg total) by mouth daily. 08/18/15  Yes Donzetta Starch, NP     Social History  Social History  . Marital Status: Widowed    Spouse Name: N/A  . Number of Children: N/A  . Years of Education: N/A   Occupational History  . Not on file.   Social History Main Topics  . Smoking status: Never Smoker   . Smokeless tobacco: Never Used  . Alcohol Use: No  . Drug Use: No  . Sexual Activity: Not on file   Other Topics Concern  . Not on file   Social History Narrative    Family Status  Relation Status Death Age  . Mother Deceased 66  . Father Deceased 39  . Sister Alive   . Brother Deceased 70  . Sister Deceased 81   Family History  Problem Relation Age of Onset  . Hypertension Mother   . Heart attack Father   . Hypertension Father   . Diabetes Sister   . Hypertension Sister   . Liver cancer Sister   . Heart attack Brother   . Hypertension Brother   . Diabetes Brother   . Diabetes Sister   . Hypertension Sister      ROS:  Full 14 point review of systems complete and found to be negative unless listed above.  Physical Exam: Blood pressure 187/66, pulse 55, temperature 97.9 F (36.6 C), temperature source Oral, resp. rate 22, height 5\' 4"  (1.626 m), weight 179 lb (81.194 kg), SpO2 98 %.  General: Well developed, well nourished, female in no acute  distress. Elderly and frail Head: Eyes PERRLA, No xanthomas.   Normocephalic and atraumatic, oropharynx without edema or exudate.  Lungs: CTAB Heart: bradycaria. Soft SEM, S1 S2, no rub/gallop, Heart reg rate and rhythm with S1, S2  . pulses are 2+ extrem.   Neck: No carotid bruits. No lymphadenopathy.  noJVD. Abdomen: Bowel sounds present, abdomen soft and non-tender without masses or hernias noted. Msk:  No spine or cva tenderness. No weakness, no joint deformities or effusions. Extremities: No clubbing or cyanosis.  No edema.  Neuro: Alert and oriented X 3. No focal deficits noted. Psych:  Good affect, responds appropriately Skin: No rashes or lesions noted.  Labs:   Lab Results  Component Value Date   WBC 23.9* 08/29/2015   HGB 14.4 08/29/2015   HCT 43.1 08/29/2015   MCV 92.5 08/29/2015   PLT  08/29/2015    PLATELET CLUMPS NOTED ON SMEAR, COUNT APPEARS ADEQUATE   No results for input(s): INR in the last 72 hours.  Recent Labs Lab 08/29/15 1132  NA 136  K 4.1  CL 101  CO2 23  BUN 17  CREATININE 0.91  CALCIUM 9.9  GLUCOSE 170*   MAGNESIUM  Date Value Ref Range Status  07/16/2009 2.1 1.5 - 2.5 mg/dL Final   No results for input(s): CKTOTAL, CKMB, TROPONINI in the last 72 hours.  Recent Labs  08/29/15 1132  TROPIPOC 0.01   PRO B NATRIURETIC PEPTIDE (BNP)  Date/Time Value Ref Range Status  10/16/2007 03:55 PM 80.0  Final   Lab Results  Component Value Date   CHOL 186 07/30/2015   HDL 36* 07/30/2015   LDLCALC 114* 07/30/2015   TRIG 182* 07/30/2015   No results found for: DDIMER LIPASE  Date/Time Value Ref Range Status  07/22/2015 01:29 PM 25 22 - 51 U/L Final   TSH  Date/Time Value Ref Range Status  07/07/2014 02:06 PM 1.430 0.350 - 4.500 uIU/mL Final    Comment:    Performed at [facility]  Date/Time  Value Ref Range Status  08/02/2014 03:43 PM 21 9 - 269 ng/ml Final    Echo: Study Date: 07/30/2015 LV EF: 65% -  70% Study  Conclusions - Left ventricle: The cavity size was normal. There was moderate concentric hypertrophy. Systolic function was vigorous. The estimated ejection fraction was in the range of 65% to 70%. Wall motion was normal; there were no regional wall motion abnormalities. Doppler parameters are consistent with abnormal left ventricular relaxation (grade 1 diastolic dysfunction). Doppler parameters are consistent with high ventricular filling pressure. - Aortic valve: Trileaflet; moderately thickened, moderately calcified leaflets especially non coronary cusp. Valve mobility was mildly restricted. There was very mild stenosis. Peak velocity (S): 244 cm/s. Mean gradient (S): 13 mm Hg. Valve area (VTI): 1.42 cm^2. Valve area (Vmax): 1.35 cm^2. Valve area (Vmean): 1.17 cm^2. - Mitral valve: Calcified annulus. Impressions: - No cardiac source of emboli was indentified. When compared to prior, mild aortic stenosis is now present.   ECG: Sinus bradycardia HR 48 Right bundle branch block Probable left ventricular hypertrophy  Radiology:  Dg Chest 2 View  08/29/2015  CLINICAL DATA:  Syncopal episode. EXAM: CHEST  2 VIEW COMPARISON:  08/20/2015 FINDINGS: The heart size and mediastinal contours are within normal limits. Both lungs are clear. No evidence of pneumothorax or pleural effusion . The visualized skeletal structures are unremarkable. IMPRESSION: No active cardiopulmonary disease. Electronically Signed   By: Earle Gell M.D.   On: 08/29/2015 12:55    ASSESSMENT AND PLAN:    Principal Problem:   Syncope and collapse Active Problems:   Breast cancer metastasized to bone (HCC)   Type 2 diabetes mellitus with peripheral neuropathy (HCC)   Heart murmur, systolic   CLL (chronic lymphocytic leukemia) (HCC)   CVA (cerebral vascular accident) ([place])   Intracranial carotid stenosis   Orthostatic hypotension   HLD (hyperlipidemia)   Recurrent UTI  Molly [name] is a 81 y.o. female with a history of labile HTN, HLD, right-sided hemiplegia s/p CVA, recent diverticular bleed, DM, severe bilateral intracranial carotid stenosis, breast cancer with metastasis to bone, and mild aortic stenosis who was sent to the [facility] ED from Dr. Phoebe Sharps neurology office with chest pain after a syncopal episode.   Labile BPs- this is not a new issue. She currently takes amlodipine 10mg , atenolol 50mg  TID, clonidine 0.1mg  BID, irbesartan 300mg  qd, and spironolactone 12.5 mg qd. Per Dr. Phoebe Sharps most recent note, recent CVAs most likely due to hypoperfusion 2/2 hypotension. She may need permissive HTN to maintain cerebral perfusion. This is acceptable in a 81 year old woman who is approaching comfort care.   Bradycardia- HR 48-55 bpm. she is currently taking atenolol 50mg  TID. We will lower this to 25mg  TID.   Chest pain - troponin x1 neg. ECG with no acute ST or TW changes. After questioning she denies having chest pain earlier today. She states that she only gets chest pain when she takes her "4 pills" and she isn't sure which one causes it   Aortic Stenosis- Aortic valve: Trileaflet; moderately thickened, moderately calcified leaflets especially non coronary cusp. Valve mobility was mildly restricted. There was very mild stenosis. Peakvelocity (S): 244 cm/s. Mean gradient (S): 13 mm Hg. Valve area (VTI): 1.42 cm^2. Valve area (Vmax): 1.35 cm^2. Valve area (Vmean): 1.17 cm^2.   SignedCrista Luria 08/29/2015 2:51 PM  Pager [phone]  I have personally seen and examined this patient with Angelena Form PA-C. I agree with the assessment and plan  as outlined above. Molly [name] has known bilateral intracranial carotid stenosis with recent CVA. She has had labile BP for at least 6 months. She has mild AS. She is on multiple BP control agents including atenolol. She is bradycardic with HR48-55 bpm. She was seen in Neurology clinic today and was hypotensive and  became dizzy. No chest pain. BP is now 295 systolic. Exam with pleasant female, RRR with systolic murmur, lungs clear. Tele with sinus brady. I do agree that she needs to be more on the hypertensive side with recent CVA felt to be due to hypoperfusion. Will lower atenolol to 25 mg po TID. Follow BP and HR tonight. Adjust meds in am. Would not be a candidate for a pacemaker given advanced age, metastatic cancer. There is low suspicion for ACS in her presentation.   Dammon Makarewicz 08/29/2015 4:05 PM

## 2015-08-29 NOTE — ED Notes (Signed)
Patient reports having near syncopal episodes for 1 week.  To MD today where she had another near syncopal episode.  To ED per EMS from [facility] office.  EMS reports that patient may be orthostatic.

## 2015-08-29 NOTE — Telephone Encounter (Signed)
Jane with [facility] called inquiring about the events that transpired in the office this morning. Please call and advise at [phone].

## 2015-08-29 NOTE — Progress Notes (Signed)
STROKE NEUROLOGY FOLLOW UP NOTE  NAME: Molly [name] DOB: 1923-04-21  REASON FOR VISIT: stroke follow up HISTORY FROM: pt and daughter  Today we had the pleasure of seeing Molly [name] in follow-up at our Neurology Clinic. Pt was accompanied by daughter.   History Summary [name] is a 81 y.o. 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 She received IV tPA for acute treatment. MRI showed left BG/CR small infarct, MRA head and neck showed diffuse intracranial stenosis including left cavernous ICA high grade stenosis. Due to multiple stroke risk factors (HTN, DM, HLD), her stroke was considered likely small vessel disease. Echo EF 65-70% and LDL 114, A1C 7.8. She was put on dual antiplatelet and resumed zetia, discharged with [facility].   She was readmitted on 08/09/15 again for right sided weakness and slurry speech. Not tPA given as family declined. Pt symptoms fluctuate in ER and after admission, as well as the BP fluctuation. MRI initially showed no new stroke except the left BG/CR subacute infarcts. But due to worsening symptoms, repeat MRI showed new enlargement of left BG/CR infarct. Pt BP up and down, without BP meds running at 180-200s, with small amount of BP meds, BP lowers to 150-160, and her symptoms worsened. MRA showed the same including left cavernous ICA high grade stenosis. She developed right sided hemiplegia and moderate dysarthria. After stabilization, she was sent to SNF.  Interval History During the interval time, the patient has been doing poor. She dislike the SNF places and complains of not getting good care and food there is nasty. On her medication list, she is not on antiplatelet but on 4 different BP meds. She had orthostatic hypotension and  passed out twice in SNF. She had ED visit on 08/20/15 due to UTI. Was treated with antibiotics. Today, she was sent here with wheelchair although daughter asked for stretcher. Her BP in clinic was 89/52 and she had syncope episode. She was helped to bed with lying position, she woke up quickly after this measure. Her BP improved to 122/69. Her face color from pale to pink. She complained of chest pain. EMS called and she was taken to [facility] ER for further evaluation.   REVIEW OF SYSTEMS: Full 14 system review of systems performed and notable only for those listed below and in HPI above, all others are negative:  Constitutional:  Weight loss, fatigue Cardiovascular: murmur Ear/Nose/Throat:   Skin:  Eyes:  Blurry vision Respiratory:   Gastroitestinal:  Diarrhea, constipation Genitourinary:  Hematology/Lymphatic:   Endocrine: feeling hot Musculoskeletal:   Allergy/Immunology:   Neurological:  Weakness, slurry speech, dizziness, passing out Psychiatric: not enough sleep, decreased energy Sleep:   The following represents the patient's updated allergies and side effects list: Allergies  Allergen Reactions  . Amlodipine Swelling    Currently taking exforge, separate med-Amlodipiine caused lower leg edema  . Contrast Media [Iodinated Diagnostic Agents] Nausea Only and Other (See Comments)    Patient experienced chills, nausea, hypothermic symptoms, also weird feelings  . Hctz [Hydrochlorothiazide] Other (See Comments)    Severe hypotension  . Hydralazine Other (See Comments)    Severe hypotension-suddenly and drastically dropped BP  . Metoprolol Other (See Comments)    Severe hypotension-suddenly and drastically dropped BP, same as hydralazine  . Codeine Other (See Comments)    Makes her  feel "crazy."  . Demerol Other (See Comments)    Makes her feel "crazy."  . Librium Other (See Comments)    Makes her feel "crazy."  . Lipitor [Atorvastatin Calcium] Other (See Comments)    Makes  her feel "crazy."    The neurologically relevant items on the patient's problem list were reviewed on today's visit.  Neurologic Examination  A problem focused neurological exam (12 or more points of the single system neurologic examination, vital signs counts as 1 point, cranial nerves count for 8 points) was performed.  There were no vitals taken for this visit.  General - Well nourished, well developed, lethargic and anxious.  Ophthalmologic - Fundi not visualized due to noncooperation.  Cardiovascular - Regular rate and rhythm.  Mental Status -  Level of arousal and orientation to time, place, and person were intact. Language including expression, naming, repetition, comprehension was assessed and found intact, but moderate dysarthria. Attention span and concentration were impaired. Recent and remote memory were checked and 3/3 registration but 1/3 delayed recall. Fund of Knowledge was assessed and was impaired.  Cranial Nerves II - XII - II - Visual field intact OU. III, IV, VI - Extraocular movements intact. V - Facial sensation intact bilaterally. VII - right facial droop. VIII - Hearing & vestibular intact bilaterally. X - Palate elevates symmetrically, moderate dysarthria. XI - Chin turning & shoulder shrug intact bilaterally. XII - Tongue protrusion intact.  Motor Strength - The patient's strength was 0/5 RUE and 1/5 RLE proximal and 2/5 distal, LUE and LLE 4/5.  Bulk was normal and fasciculations were absent.   Motor Tone - Muscle tone was assessed at the neck and appendages and was normal.  Reflexes - The patient's reflexes were 1+ in all extremities and she had no pathological reflexes.  Sensory - Light touch, temperature/pinprick were assessed and were normal.    Coordination - The patient had normal movements in the left hand with no ataxia or dysmetria.  Tremor was absent.  Gait and Station - not tested due to orthostatic hypotension.  Data reviewed: I  personally reviewed the images and agree with the radiology interpretations.  Ct Head Wo Contrast 08/10/2015  1. Expected evolution of the left basal ganglia infarct since prior exam. No interval hemorrhage, mass effect or midline shift.  2. Background chronic small vessel ischemia without CT findings of acute territorial infarct.   08/12/2015 IMPRESSION: Atrophy with small vessel chronic ischemic changes of deep cerebral white matter. Subacute nonhemorrhagic infarct at LEFT basal ganglia unchanged. Extensive atherosclerotic calcification. No new intracranial abnormalities.   Dg Chest Portable 1 View 08/10/2015  No acute pulmonary process.   Mri and Mra head and neck W Wo Contrast 07/30/2015 IMPRESSION: Acute infarction affecting the left basal ganglia and radiating white matter tracts. No swelling or hemorrhage. Extensive chronic small vessel ischemic changes elsewhere throughout the brain. No carotid bifurcation disease. 30-50% stenoses at both vertebral artery origins. Severe atherosclerotic disease intracranially. Advanced disease in the siphon regions and supraclinoid internal carotid arteries with stenosis of the left supraclinoid internal carotid artery of 80% or greater. Stenosis on the right is 60-70%. Approximately 50% stenoses in the distal vertebral arteries and in the proximal basilar artery. Diffuse atherosclerotic narrowing and irregularity in the smaller intracranial branches.  Mri and Mra Head Wo Contrast 08/10/2015 IMPRESSION: 1. Expected evolution of the recent left MCA white matter infarct. No interval hemorrhage. No associated mass effect. 2. No new intracranial abnormality. 3. Stable severe intracranial atherosclerosis on  MRA. Moderate to high-grade stenoses of the distal left vertebral artery, both distal ICA siphons, and the proximal basilar artery.   Mr Brain Wo Contrast  08/12/2015 IMPRESSION: 1. 1.5 cm recurrent acute infarct centered in the  posterior left corona radiata, new from the MRI of 2 days ago. This is in the same location although is mildly larger than that on the 07/30/2015 MRI. 2. Chronic ischemic changes as above.   2D ehco 07/30/15 - - Left ventricle: The cavity size was normal. There was moderate concentric hypertrophy. Systolic function was vigorous. The estimated ejection fraction was in the range of 65% to 70%. Wall motion was normal; there were no regional wall motion abnormalities. Doppler parameters are consistent with abnormal left ventricular relaxation (grade 1 diastolic dysfunction). Doppler parameters are consistent with high ventricular filling pressure. - Aortic valve: Trileaflet; moderately thickened, moderately calcified leaflets especially non coronary cusp. Valve mobility was mildly restricted. There was very mild stenosis. Peak velocity (S): 244 cm/s. Mean gradient (S): 13 mm Hg. Valve area (VTI): 1.42 cm^2. Valve area (Vmax): 1.35 cm^2. Valve area (Vmean): 1.17 cm^2. - Mitral valve: Calcified annulus Impressions: - No cardiac source of emboli was indentified. When compared to prior, mild aortic stenosis is now present.  Component     Latest Ref Rng 07/30/2015  Cholesterol     0 - 200 mg/dL 186  Triglycerides     <150 mg/dL 182 (H)  HDL Cholesterol     >40 mg/dL 36 (L)  Total CHOL/HDL Ratio      5.2  VLDL     0 - 40 mg/dL 36  LDL (calc)     0 - 99 mg/dL 114 (H)  Hemoglobin A1C     4.8 - 5.6 % 7.8 (H)  Mean Plasma Glucose      177    Assessment and Plan: As you may recall, she is a 81 y.o. 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 Echo EF 65-70% and LDL 114, A1C 7.8. She was put on dual antiplatelet and resumed zetia,  discharged with [facility]. She was readmitted on 08/09/15 again for new enlargement of left BG/CR infarct. Pt BP up and down. MRA showed the same including left cavernous ICA high grade stenosis. Her stroke likely due to hypoperfusion with low BP. She was sent to SNF after stabilization. However, she was not on antiplatelet in SNF and she was on 4 different BP meds in SNF. She developed orthostatic hypotension and had syncope x 2. She was in clinic today also had syncope with BP 89/52. After lying down, her BP improved to 122/69. She complained of chest pain. EMS called and she was taken to [facility] ER for further evaluation.   A total of 45 minutes was spent face-to-face with this patient. Over half this time was spent on stabilizing the pt, managing BP, communicating with EMS staff, reassuring the family and pt.    No orders of the defined types were placed in this encounter.    No orders of the defined types were placed in this encounter.    There are no Patient Instructions on file for this visit.  Rosalin Hawking, MD PhD [facility] [address], [place], [place] [address] [phone]

## 2015-08-29 NOTE — Patient Instructions (Signed)
Pt was sent to [facility] by EMS

## 2015-08-29 NOTE — Telephone Encounter (Signed)
Rn call Opal Sidles at [phone] who is the nurse practitioner at [facility] where patient is a residence.Rn explain that patient had a syncope episode in the office with her daughter at bedside. Rn explain to Jane(NP) that patients blood pressure drop. Opal Sidles stated they sent patient to [place] because they thought she was having seizures. Rn explain that Dr.Xu saw patient and felt it would be unsafe for her to return to the facility. The Md felt patient needed to be admitted the hospital for a work up on her dizzy spells and passing out spells.

## 2015-08-29 NOTE — H&P (Signed)
[facility] History and Physical                                                                                    Molly [name], is a 81 y.o. female  MRN: [id number]   DOB - November 25, 1922  Admit Date - 08/29/2015  Outpatient Primary MD for the patient is FULP, CAMMIE, MD  Referring Physician:  Mikal Plane, PA-C  Chief Complaint:   Chief Complaint  Patient presents with  . Near Syncope     HPI  Molly [name]  is a 81 y.o. 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 Phoebe Sharps neurology office with chest pain after a syncopal episode.  In brief,  [name] had a stroke in September 2016 and received TPA. She was readmitted in October with progression of the stroke and while in the hospital had a second stroke. She was found to have bilateral severe carotid stenosis. During the same hospitalization she had a diverticular bleed. She developed very labile blood pressures.  Since discharge she has had 2 syncopal episodes at the SNF.  She had a third syncopal episode today in the neurology out patient office. After her syncopal episode she developed chest pain. The patient says she has similar chest pain whenever she takes several pills at the same time. Today's chest pain is different because it has not resolved over the last several hours. It is located just left of her sternum and does not radiate it is dull in nature.  In the ER her white count is 23.9 (this is baseline).  Urine is positive for UA. Systolic blood pressure has ranged from 89-187. Pulse rate has ranged from 40-65.  Review of Systems   In addition to the HPI above,  No Fever-chills, No Headache, No changes with Vision or hearing, No problems swallowing food or Liquids, she denies coughing while eating or drinking. No Chest pain, Cough or Shortness of Breath, No Abdominal  pain, No Nausea or Vomiting, Bowel movements are regular, No Blood in stool or Urine, No dysuria, No new skin rashes or bruises, No new joints pains-aches,  No new weakness, tingling, numbness in any extremity, No recent weight gain or loss, A full 10 point Review of Systems was done, except as stated above, all other Review of Systems were negative.  Past Medical History  Past Medical History  Diagnosis Date  . Hypertension   . MVP (mitral valve prolapse)   . Hypothyroidism   . Anemia   . Hypercholesterolemia   . Heart murmur   . Type II diabetes mellitus ([place])   . Arthritis     "back; knees, hands" (05/11/2015)  . Breast cancer, right breast ([place])     "cells went into the blood and caused her hip to break" (05/11/2015)    Past Surgical History  Procedure Laterality Date  . Hemiarthroplasty hip Right     "partial replacement"  . Pelvic and para-aortic lymph node dissection    . Tonsillectomy    . Appendectomy    . Cholecystectomy    . US  echocardiography  03/30/2009    EF 55-60%  . Cardiovascular stress test  01/28/2008    EF 74%  . Esophagogastroduodenoscopy N/A 07/08/2014    Procedure: ESOPHAGOGASTRODUODENOSCOPY (EGD);  Surgeon: Winfield Cunas., MD;  Location: [facility];  Service: Endoscopy;  Laterality: N/A;  . Colonoscopy N/A 07/10/2014    Procedure: COLONOSCOPY;  Surgeon: Lear Ng, MD;  Location: [facility];  Service: Endoscopy;  Laterality: N/A;  . Total abdominal hysterectomy      w/BSO  . Dilation and curettage of uterus    . Breast biopsy Right 2009  . Breast lumpectomy Right 2009  . Fracture surgery    . Cataract extraction w/ intraocular lens  implant, bilateral Bilateral       Social History Social History  Substance Use Topics  . Smoking status: Never Smoker   . Smokeless tobacco: Never Used  . Alcohol Use: No    Family History Family History  Problem Relation Age of Onset  . Hypertension Mother   . Heart attack Father   .  Hypertension Father   . Diabetes Sister   . Hypertension Sister   . Liver cancer Sister   . Heart attack Brother   . Hypertension Brother   . Diabetes Brother   . Diabetes Sister   . Hypertension Sister     Prior to Admission medications   Medication Sig Start Date End Date Taking? Authorizing Provider  ACCU-CHEK SMARTVIEW test strip  06/13/15  Yes Historical Provider, MD  amLODipine (NORVASC) 10 MG tablet Take 1 tablet (10 mg total) by mouth daily. 08/19/15  Yes Donzetta Starch, NP  anastrozole (ARIMIDEX) 1 MG tablet TAKE 1 TABLET BY MOUTH EVERY DAY 02/28/15  Yes Ladell Pier, MD  atenolol (TENORMIN) 50 MG tablet Take 1 tablet (50 mg total) by mouth 3 (three) times daily. 07/06/15  Yes Darlin Coco, MD  Calcium Carbonate-Vitamin D (CALCIUM + D PO) Take 1 tablet by mouth daily after lunch.    Yes Historical Provider, MD  cephALEXin (KEFLEX) 500 MG capsule Take 500 mg by mouth 2 (two) times daily.   Yes Historical Provider, MD  cloNIDine (CATAPRES) 0.1 MG tablet Take 1 tablet (0.1 mg total) by mouth 2 (two) times daily. 08/02/15  Yes David L Rinehuls, PA-C  Cyanocobalamin (B-12) 500 MCG TABS Take 500 mcg by mouth daily after lunch.    Yes Historical Provider, MD  ezetimibe (ZETIA) 10 MG tablet Take 1 tablet (10 mg total) by mouth daily. 09/07/14  Yes Darlin Coco, MD  FERROCITE 324 MG TABS TAKE 1 TABLET (106 MG OF IRON TOTAL) BY MOUTH DAILY. Patient taking differently: TAKE 1 TABLET (106 MG OF IRON TOTAL) BY MOUTH DAILY AFTER LUNCH 02/28/15  Yes Ladell Pier, MD  irbesartan (AVAPRO) 300 MG tablet Take 1 tablet (300 mg total) by mouth daily. 08/19/15  Yes Donzetta Starch, NP  LANTUS SOLOSTAR 100 UNIT/ML Solostar Pen Inject 10 Units into the skin daily at 12 noon. Patient taking differently: Inject 18 Units into the skin daily at 12 noon.  08/18/15  Yes Donzetta Starch, NP  levothyroxine (SYNTHROID, LEVOTHROID) 75 MCG tablet Take 75 mcg by mouth daily before breakfast.    Yes Historical  Provider, MD  polyethylene glycol (MIRALAX / GLYCOLAX) packet Take 17 g by mouth daily. 08/18/15  Yes Donzetta Starch, NP  potassium chloride (KLOR-CON M10) 10 MEQ tablet Take 1 tablet (10 mEq total) by mouth daily after lunch. 08/18/15  Yes Sharon L  Biby, NP  spironolactone (ALDACTONE) 25 MG tablet Take 0.5 tablets (12.5 mg total) by mouth daily. 08/18/15  Yes Donzetta Starch, NP    Allergies  Allergen Reactions  . Amlodipine Swelling    Currently taking exforge, separate med-Amlodipiine caused lower leg edema  . Contrast Media [Iodinated Diagnostic Agents] Nausea Only and Other (See Comments)    Patient experienced chills, nausea, hypothermic symptoms, also weird feelings  . Hctz [Hydrochlorothiazide] Other (See Comments)    Severe hypotension  . Hydralazine Other (See Comments)    Severe hypotension-suddenly and drastically dropped BP  . Metoprolol Other (See Comments)    Severe hypotension-suddenly and drastically dropped BP, same as hydralazine  . Codeine Other (See Comments)    Makes her feel "crazy."  . Demerol Other (See Comments)    Makes her feel "crazy."  . Librium Other (See Comments)    Makes her feel "crazy."  . Lipitor [Atorvastatin Calcium] Other (See Comments)    Makes her feel "crazy."    Physical Exam  Vitals  Blood pressure 187/66, pulse 55, temperature 97.9 F (36.6 C), temperature source Oral, resp. rate 22, height 5\' 4"  (1.626 m), weight 81.194 kg (179 lb), SpO2 98 %.   General: Elderly female lying in bed in mild distress, daughter at bedside  Psych:  Normal affect and insight, Not Suicidal or Homicidal, Awake Alert, Oriented X 3.  Neuro:   No acute changes in neuro exam. Patient has right-sided facial droop, flaccid right arm, and right lower extremity weakness with decreased sensation.  ENT:  Ears and Eyes appear Normal, Conjunctivae clear, PER. Moist oral mucosa without erythema or exudates.  Neck:  Supple, No lymphadenopathy appreciated,  + bilateral  carotid bruits  Respiratory:  Symmetrical chest wall movement, Good air movement bilaterally, CTAB.  Cardiac:  RRR, + 2/6 systolic murmur, no LE edema noted, no JVD.    Abdomen:  Positive bowel sounds, Soft, Non tender, Non distended,  No masses appreciated  Skin:  No Cyanosis, Normal Skin Turgor, No Skin Rash or Bruise. I did not examine her sacral area.  Extremities:   no effusions.  Data Review  Wt Readings from Last 3 Encounters:  08/29/15 81.194 kg (179 lb)  08/29/15 81.194 kg (179 lb)  08/11/15 84.5 kg (186 lb 4.6 oz)    CBC  Recent Labs Lab 08/29/15 1132  WBC 23.9*  HGB 14.4  HCT 43.1  PLT PLATELET CLUMPS NOTED ON SMEAR, COUNT APPEARS ADEQUATE  MCV 92.5  MCH 30.9  MCHC 33.4  RDW 14.7  LYMPHSABS 9.8*  MONOABS 0.7  EOSABS 0.2  BASOSABS 0.0    Chemistries   Recent Labs Lab 08/29/15 1132  NA 136  K 4.1  CL 101  CO2 23  GLUCOSE 170*  BUN 17  CREATININE 0.91  CALCIUM 9.9    Urinalysis    Component Value Date/Time   COLORURINE YELLOW 08/29/2015 1200   APPEARANCEUR CLOUDY* 08/29/2015 1200   LABSPEC 1.010 08/29/2015 1200   PHURINE 6.5 08/29/2015 1200   GLUCOSEU NEGATIVE 08/29/2015 1200   HGBUR NEGATIVE 08/29/2015 1200   BILIRUBINUR NEGATIVE 08/29/2015 1200   KETONESUR NEGATIVE 08/29/2015 1200   PROTEINUR NEGATIVE 08/29/2015 1200   UROBILINOGEN 0.2 08/29/2015 1200   NITRITE NEGATIVE 08/29/2015 1200   LEUKOCYTESUR MODERATE* 08/29/2015 1200    Imaging results:   Dg Chest 2 View  08/29/2015  CLINICAL DATA:  Syncopal episode. EXAM: CHEST  2 VIEW COMPARISON:  08/20/2015 FINDINGS: The heart size and mediastinal contours are within  normal limits. Both lungs are clear. No evidence of pneumothorax or pleural effusion . The visualized skeletal structures are unremarkable. IMPRESSION: No active cardiopulmonary disease. Electronically Signed   By: Earle Gell M.D.   On: 08/29/2015 12:55   Ct Head Wo Contrast  08/20/2015  CLINICAL DATA:  Acute onset of  generalized weakness, blurred vision and confusion. Initial encounter. EXAM: CT HEAD WITHOUT CONTRAST TECHNIQUE: Contiguous axial images were obtained from the base of the skull through the vertex without intravenous contrast. COMPARISON:  CT of the head performed 08/14/2015 FINDINGS: There is no evidence of acute infarction, mass lesion, or intra- or extra-axial hemorrhage on CT. The recurrent evolving infarct in the left centrum semiovale is again noted. Prominence of the ventricles and sulci reflects mild cortical volume loss. Scattered periventricular and subcortical white matter change likely reflects small vessel ischemic microangiopathy. Cerebellar atrophy is noted. Small chronic lacunar infarcts are noted at the basal ganglia bilaterally. The brainstem and fourth ventricle are within normal limits. The basal ganglia are unremarkable in appearance. The cerebral hemispheres demonstrate grossly normal gray-white differentiation. No mass effect or midline shift is seen. There is no evidence of fracture; visualized osseous structures are unremarkable in appearance. The orbits are within normal limits. The paranasal sinuses and mastoid air cells are well-aerated. No significant soft tissue abnormalities are seen. IMPRESSION: 1. No acute intracranial pathology seen on CT. 2. Evolving infarct at the left centrum semiovale again noted. 3. Mild cortical volume loss and scattered small vessel ischemic microangiopathy. 4. Small chronic lacunar infarcts at the basal ganglia bilaterally. Electronically Signed   By: Garald Balding M.D.   On: 08/20/2015 17:54   Ct Head Wo Contrast  08/14/2015  CLINICAL DATA:  Follow up stroke.  RIGHT-sided weakness. EXAM: CT HEAD WITHOUT CONTRAST TECHNIQUE: Contiguous axial images were obtained from the base of the skull through the vertex without intravenous contrast. COMPARISON:  Most recent MR 08/12/2015. Most recent CT head 08/12/2015. FINDINGS: Increasingly well defined  hypoattenuation in the LEFT centrum semiovale representing the area of restricted diffusion on the recent MR. No definite new areas of acute infarction. No hemorrhagic transformation. Baseline atrophy with small vessel disease. Calvarium intact. No sinus or acute mastoid disease. Vascular calcification. IMPRESSION: Increasingly well-defined hypoattenuation representing acute infarction LEFT MCA territory. No hemorrhagic transformation. Electronically Signed   By: Staci Righter M.D.   On: 08/14/2015 17:14   Ct Head Wo Contrast  08/12/2015  CLINICAL DATA:  Recent stroke, post tPA with resolution of symptoms, readmitted for new RIGHT-sided weakness, slurred speech and facial droop, hypertension, type II diabetes mellitus, RIGHT breast cancer, hypercholesterolemia EXAM: CT HEAD WITHOUT CONTRAST TECHNIQUE: Contiguous axial images were obtained from the base of the skull through the vertex without intravenous contrast. COMPARISON:  08/10/2015 FINDINGS: Generalized atrophy. Normal ventricular morphology. No midline shift or mass effect. Extensive small vessel chronic ischemic changes of deep cerebral white matter. No intracranial hemorrhage, mass lesion or evidence acute infarction. Again identified subacute nonhemorrhagic infarct anterior LEFT basal ganglia. No extra-axial fluid collections. Extensive atherosclerotic calcifications of internal carotid and vertebral arteries at skullbase. Bones and sinuses unremarkable. IMPRESSION: Atrophy with small vessel chronic ischemic changes of deep cerebral white matter. Subacute nonhemorrhagic infarct at LEFT basal ganglia unchanged. Extensive atherosclerotic calcification. No new intracranial abnormalities. Electronically Signed   By: Lavonia Dana M.D.   On: 08/12/2015 08:26   Ct Head Wo Contrast  08/10/2015  CLINICAL DATA:  Code stroke. Right-sided weakness with right facial droop. EXAM: CT HEAD WITHOUT  CONTRAST TECHNIQUE: Contiguous axial images were obtained from the base  of the skull through the vertex without intravenous contrast. COMPARISON:  Head CT 07/29/2015, brain MRI 07/30/2015 FINDINGS: No intracranial hemorrhage. Expected evolution with minimal developing hypodensity in the left basal ganglia at site of acute infarct on MRI, no associated edema. No evidence territorial infarct or progressive acute ischemia. Background atrophy and chronic small vessel ischemia is stable in the interim. No cerebral edema, extra-axial fluid collection, mass effect or midline shift. Atherosclerosis again seen of the skullbase vasculature. Tiny mucous retention cyst right maxillary sinus. Calvarium is intact. IMPRESSION: 1. Expected evolution of the left basal ganglia infarct since prior exam. No interval hemorrhage, mass effect or midline shift. 2. Background chronic small vessel ischemia without CT findings of acute territorial infarct. These results were called by telephone at the time of interpretation on 08/10/2015 at 12:11 am to Dr. Janann Colonel , who verbally acknowledged these results. Electronically Signed   By: Jeb Levering M.D.   On: 08/10/2015 00:12   Mr Virgel Paling Wo Contrast  08/10/2015  CLINICAL DATA:  81 year old female with recent lacunar infarct in the left hemisphere earlier this month status post tPA. Recurrent right side weakness and facial droop. Initial encounter. EXAM: MRI HEAD WITHOUT CONTRAST MRA HEAD WITHOUT CONTRAST TECHNIQUE: Multiplanar, multiecho pulse sequences of the brain and surrounding structures were obtained without intravenous contrast. Angiographic images of the head were obtained using MRA technique without contrast. COMPARISON:  Head CT at 0007 hours today. Brain MRI and head and neck MRA 07/30/2015 FINDINGS: MRI HEAD FINDINGS Linear restricted diffusion extending from the posterior left corona radiata inferiorly toward the putamen and external capsule has faded since 07/30/2015. There is stable curvilinear restricted diffusion in the more anterior corona  radiata (series 6, image 18). No new areas of diffusion restriction. Major intracranial vascular flow voids are stable. No interval hemorrhage. No intracranial mass effect. No new intracranial signal abnormality. No mass effect, evidence of mass lesion, ventriculomegaly, or extra-axial collection. Cervicomedullary junction and pituitary are within normal limits. Stable paranasal sinuses, mastoids, visualized internal auditory structures, orbits soft tissues, and scalp soft tissues. Negative visualized cervical spine. Bone marrow signal remains normal. MRA HEAD FINDINGS Stable posterior circulation, high-grade stenosis of the left vertebral artery V4 segment and asymmetrically decreased flow signal at the left PICA origin. Up to moderate proximal basilar artery stenosis. The basilar artery remains patent. SCA and PCAs are stable, mild to moderate left P1 segment stenosis. Posterior communicating arteries are diminutive or absent. Stable antegrade flow signal in the distal cervical ICAs. Stable ICA siphons with high-grade bilateral supraclinoid segment irregularity. Both ophthalmic artery origins remain patent. Stable carotid termini. MCA and ACA origins remain patent. Stable visualized bilateral ACA branches. Stable visualized right MCA branches with mild mid M1 stenosis. Left MCA M1 segment and bifurcation irregularity and up to mild stenosis appears stable. Visualized left MCA branches are stable. IMPRESSION: 1. Expected evolution of the recent left MCA white matter infarct. No interval hemorrhage. No associated mass effect. 2. No new intracranial abnormality. 3. Stable severe intracranial atherosclerosis on MRA. Moderate to high-grade stenoses of the distal left vertebral artery, both distal ICA siphons, and the proximal basilar artery. Electronically Signed   By: Genevie Ann M.D.   On: 08/10/2015 14:21   Mr Brain Wo Contrast  08/12/2015  CLINICAL DATA:  Right upper and right lower extremity weakness, asymmetric  smile, rightward tongue deviation. Recent left MCA infarct. EXAM: MRI HEAD WITHOUT CONTRAST TECHNIQUE: Multiplanar, multiecho  pulse sequences of the brain and surrounding structures were obtained without intravenous contrast. COMPARISON:  08/10/2015 FINDINGS: There is a 1.5 cm focus of restricted diffusion consistent with acute infarct in the left lateral lenticulostriate artery territory extending from the posterior body of the left caudate to the posterior left lentiform nucleus, predominantly involving the posterior corona radiata. This restricted diffusion is new from the MRI of 2 days ago. It is in the same locate as the infarct on the 07/30/2015 brain MRI, although it is more confluent and mildly larger in size. Residual abnormal diffusion signal more anteriorly in the left corona radiata on the prior MRI has further decreased, without residual restricted diffusion in this location. Mild T1 hyperintensity and susceptibility artifact in the left putamen, predominantly posteriorly, may reflect subacute petechial blood products related to the recent ischemia. There is no parenchymal hematoma. A focus of chronic hemorrhage is again seen in the white matter of the right frontal lobe. There is no mass, midline shift, or extra-axial fluid collection. Moderate generalized cerebral atrophy and moderate chronic small vessel ischemic change in the cerebral white matter are stable. Small, chronic infarcts are again seen in the bilateral basal ganglia, thalami, deep cerebral white matter, and pons. Prior bilateral cataract extraction is noted. Tiny right maxillary sinus mucous retention cysts are again noted. Mastoid air cells are clear. Major intracranial vascular flow voids are preserved. IMPRESSION: 1. 1.5 cm recurrent acute infarct centered in the posterior left corona radiata, new from the MRI of 2 days ago. This is in the same location although is mildly larger than that on the 07/30/2015 MRI. 2. Chronic ischemic  changes as above. Electronically Signed   By: Logan Bores M.D.   On: 08/12/2015 09:21   Mr Brain Wo Contrast  08/10/2015  CLINICAL DATA:  81 year old female with recent lacunar infarct in the left hemisphere earlier this month status post tPA. Recurrent right side weakness and facial droop. Initial encounter. EXAM: MRI HEAD WITHOUT CONTRAST MRA HEAD WITHOUT CONTRAST TECHNIQUE: Multiplanar, multiecho pulse sequences of the brain and surrounding structures were obtained without intravenous contrast. Angiographic images of the head were obtained using MRA technique without contrast. COMPARISON:  Head CT at 0007 hours today. Brain MRI and head and neck MRA 07/30/2015 FINDINGS: MRI HEAD FINDINGS Linear restricted diffusion extending from the posterior left corona radiata inferiorly toward the putamen and external capsule has faded since 07/30/2015. There is stable curvilinear restricted diffusion in the more anterior corona radiata (series 6, image 18). No new areas of diffusion restriction. Major intracranial vascular flow voids are stable. No interval hemorrhage. No intracranial mass effect. No new intracranial signal abnormality. No mass effect, evidence of mass lesion, ventriculomegaly, or extra-axial collection. Cervicomedullary junction and pituitary are within normal limits. Stable paranasal sinuses, mastoids, visualized internal auditory structures, orbits soft tissues, and scalp soft tissues. Negative visualized cervical spine. Bone marrow signal remains normal. MRA HEAD FINDINGS Stable posterior circulation, high-grade stenosis of the left vertebral artery V4 segment and asymmetrically decreased flow signal at the left PICA origin. Up to moderate proximal basilar artery stenosis. The basilar artery remains patent. SCA and PCAs are stable, mild to moderate left P1 segment stenosis. Posterior communicating arteries are diminutive or absent. Stable antegrade flow signal in the distal cervical ICAs. Stable ICA  siphons with high-grade bilateral supraclinoid segment irregularity. Both ophthalmic artery origins remain patent. Stable carotid termini. MCA and ACA origins remain patent. Stable visualized bilateral ACA branches. Stable visualized right MCA branches with mild mid M1  stenosis. Left MCA M1 segment and bifurcation irregularity and up to mild stenosis appears stable. Visualized left MCA branches are stable. IMPRESSION: 1. Expected evolution of the recent left MCA white matter infarct. No interval hemorrhage. No associated mass effect. 2. No new intracranial abnormality. 3. Stable severe intracranial atherosclerosis on MRA. Moderate to high-grade stenoses of the distal left vertebral artery, both distal ICA siphons, and the proximal basilar artery. Electronically Signed   By: Genevie Ann M.D.   On: 08/10/2015 14:21   Dg Chest Port 1 View  08/20/2015  CLINICAL DATA:  81 year old with acute onset of blurred vision, generalized weakness and confusion earlier today while at the nursing home. EXAM: PORTABLE CHEST 1 VIEW COMPARISON:  08/10/2015 and earlier. FINDINGS: Cardiac silhouette moderately enlarged, unchanged. Linear atelectasis involving the left lung base. Lungs otherwise clear. No localized airspace consolidation. No pleural effusions. No pneumothorax. Normal pulmonary vascularity. IMPRESSION: Stable cardiomegaly. Linear atelectasis involving the left lung base. No acute cardiopulmonary disease otherwise. Electronically Signed   By: Evangeline Dakin M.D.   On: 08/20/2015 17:39   Dg Chest Portable 1 View  08/10/2015  CLINICAL DATA:  Weakness. EXAM: PORTABLE CHEST 1 VIEW COMPARISON:  07/29/2015 FINDINGS: Lung volumes are low. The cardiomediastinal contours are unchanged. Pulmonary vasculature is normal. No consolidation, pleural effusion, or pneumothorax. No acute osseous abnormalities are seen. Remote left rib fracture is unchanged. IMPRESSION: No acute pulmonary process. Electronically Signed   By: Jeb Levering M.D.   On: 08/10/2015 02:27    My personal review of EKG: NSR, No ST changes noted.   Assessment & Plan  Principal Problem:   Syncope and collapse Active Problems:   Breast cancer metastasized to bone ([facility])   Type 2 diabetes mellitus with peripheral neuropathy ([facility])   Heart murmur, systolic   CLL (chronic lymphocytic leukemia) ([facility])   CVA (cerebral vascular accident) ([place])   Intracranial carotid stenosis   Orthostatic hypotension   HLD (hyperlipidemia)   Recurrent UTI    Syncope and collapse Multifactorial:  Labile blood pressure causing orthostatic hypotension in combination with bilateral ICA stenosis, as well as bradycardia and UTI.  I have asked cardiology to consult for labile BP, and bradycardia.  Chest pain Possibly esophageal in origin - patient reports she normally has the pain when she takes her pills, but today it has persisted.  Possibly ACS.  Will cycle troponin.  Start plavix daily.  Orthostatic Hypotension / Labile BP Patient was on amlodipine, atenolol, clonidine, avapro, and aldactone.  Will hold aldactone and clonidine.  Add low dose PRN hydralazine (2 mg) for SBP > 180.  Would allow permissive hypertension (up to 180) going forward.  I have asked cardiology to consult for labile BP, and bradycardia.  Bilateral severe intracranial stenosis. L > R.  Per daughter, patient is not a surgical candidate.  Will restart plavix.  Recurrent UTI Patient complaining of burning. UA positive for infection. Per daughter patient has had a UTI since July. Most recent urine culture shows Escherichia coli resistant to Augmentin and Cipro.   Patient just finished a 7 day course of Keflex on 08/27/15.   Will try Bactrim. Urine sent for culture.  Type 2 diabetes with peripheral neuropathy Will continue Lantus at 10 units daily and add sliding scale sensitive.  Recent presumed diverticular bleed (08/2015) Seen last hospitalization by Dr. Oletta Lamas [facility]) who recommended  holding antiplatelet therapy for several days. As we are past the window I will start Plavix. Please monitor CBC and stools  for any potential bleeding.  Stable metastatic breast cancer and CLL Followed by Dr. Benay Spice.  On Arimidex. White count is at baseline. (22-24)  Recent strokes First in September 2016 the second in October 2016. Patient has residual right-sided hemiplegia and facial droop with dysarthria.Restart Plavix. Patient does not appear to be on a statin (?). Recommend outpatient follow-up with Dr.Xu. Swallow eval to rule out aspiration.  Hypothyroidism Continue synthroid.  Check TSH.   Social Patient is unhappy with care at current SNF. Desires discharge to different SNF. Patient and daughter agree she is a DO NOT RESUSCITATE. Receptive to palliative medicine consultation. Leaning towards comfort care rather than aggressive therapy.    Consultants Called:  none  Family Communication:   Daughter at bedside  Code Status:  DNR  Condition:  Guarded.  Potential Disposition:  To SNF when stabilized.  Time spent in minutes : [place],  [place] on 08/29/2015 at 2:21 PM Between 7am to 7pm - Pager - [phone] After 7pm go to [email or web address] - password [email or web address] And look for the night coverage person covering me after hours

## 2015-08-30 ENCOUNTER — Encounter (HOSPITAL_COMMUNITY): Payer: Self-pay | Admitting: Physician Assistant

## 2015-08-30 DIAGNOSIS — Z66 Do not resuscitate: Secondary | ICD-10-CM | POA: Diagnosis not present

## 2015-08-30 DIAGNOSIS — Z515 Encounter for palliative care: Secondary | ICD-10-CM | POA: Diagnosis not present

## 2015-08-30 DIAGNOSIS — I63032 Cerebral infarction due to thrombosis of left carotid artery: Secondary | ICD-10-CM | POA: Diagnosis not present

## 2015-08-30 DIAGNOSIS — R55 Syncope and collapse: Secondary | ICD-10-CM | POA: Diagnosis not present

## 2015-08-30 LAB — GLUCOSE, CAPILLARY
GLUCOSE-CAPILLARY: 170 mg/dL — AB (ref 65–99)
Glucose-Capillary: 166 mg/dL — ABNORMAL HIGH (ref 65–99)
Glucose-Capillary: 173 mg/dL — ABNORMAL HIGH (ref 65–99)
Glucose-Capillary: 156 mg/dL — ABNORMAL HIGH (ref 65–99)
Glucose-Capillary: 144 mg/dL — ABNORMAL HIGH (ref 65–99)

## 2015-08-30 LAB — TSH: TSH: 3.327 u[IU]/mL (ref 0.350–4.500)

## 2015-08-30 LAB — TROPONIN I: TROPONIN I: 0.03 ng/mL (ref <0.031)

## 2015-08-30 MED ORDER — ATENOLOL 25 MG PO TABS
25.0000 mg | ORAL_TABLET | Freq: Three times a day (TID) | ORAL | Status: DC
  Administered 2015-08-30 – 2015-09-02 (×10): 25 mg via ORAL
  Filled 2015-08-30 (×12): qty 1

## 2015-08-30 NOTE — Evaluation (Signed)
Physical Therapy Evaluation Patient Details Name: CASHE GATT MRN: [id number] DOB: 11/10/1923 Today's Date: 08/30/2015   History of Present Illness  Pt has severely dec awareness of balance deficits and when asked what he plans to do if he loses his balance at home he says that he won't fall at home.  Explained to pt and pt's wife the potential consequences of a fall at home and both agree that either 24/7 supervision will be needed at home or pt will need to go SNF.    Clinical Impression  Pt admitted with above diagnosis. Pt currently with functional limitations due to the deficits listed below (see PT Problem List).  Ms. Jakes is at her baseline level of function and currently requires total +2 assist for rolling in bed.  She is planning to d/c to SNF where she will have 24/7 care. Any further PT needs can be addressed in this setting, PT signing off. Thank you for this order.    Follow Up Recommendations SNF;Supervision/Assistance - 24 hour    Equipment Recommendations  None recommended by PT    Recommendations for Other Services       Precautions / Restrictions Precautions Precautions: Fall Precaution Comments: dense R hemi Restrictions Weight Bearing Restrictions: No      Mobility  Bed Mobility Overal bed mobility: Needs Assistance;+2 for physical assistance Bed Mobility: Rolling Rolling: Total assist;+2 for physical assistance         General bed mobility comments: Total +2 assist for rolling Bil.  Cues to reach w/ Lt UE across body as pt reaches for railing.  Pericare provided in sidelying   Transfers                    Ambulation/Gait                Stairs            Wheelchair Mobility    Modified Rankin (Stroke Patients Only)       Balance                                             Pertinent Vitals/Pain Pain Assessment: Faces Faces Pain Scale: Hurts a little bit Pain Location: buttocks Pain  Descriptors / Indicators: Aching Pain Intervention(s): Limited activity within patient's tolerance;Monitored during session;Repositioned    Home Living Family/patient expects to be discharged to:: Skilled nursing facility                 Additional Comments: Pt from [facility] but was unhappy w/ her care there and will go to different SNF at d/c.    Prior Function Level of Independence: Needs assistance   Gait / Transfers Assistance Needed: Has not been ambulatory for several weeks and requires hoyer lift for transfer to chair.  ADL's / Homemaking Assistance Needed: Needs assist for bathing, dressing        Hand Dominance        Extremity/Trunk Assessment   Upper Extremity Assessment: RUE deficits/detail;Generalized weakness RUE Deficits / Details: Grossly trace digit AROM but uses her Lt UE to move her Rt UE         Lower Extremity Assessment: RLE deficits/detail;Generalized weakness RLE Deficits / Details: Grossly 3/5 Rt LE        Communication   Communication: Other (comment) (dysarthric, but intelligible)  Cognition Arousal/Alertness: Awake/alert Behavior During  Therapy: WFL for tasks assessed/performed Overall Cognitive Status: Within Functional Limits for tasks assessed                      General Comments General comments (skin integrity, edema, etc.): Pt currently requires total +2 assist for rolling in bed and is not currently appropriate for PT follow. Pt positioined comfortably in bed w/ pillow placed under legs to prevent bed sore on heels.    Exercises        Assessment/Plan    PT Assessment All further PT needs can be met in the next venue of care  PT Diagnosis Difficulty walking;Generalized weakness;Hemiplegia dominant side   PT Problem List Decreased strength;Decreased range of motion;Decreased activity tolerance;Decreased balance;Decreased mobility;Decreased knowledge of use of DME;Decreased safety awareness;Decreased knowledge  of precautions;Impaired sensation  PT Treatment Interventions     PT Goals (Current goals can be found in the Care Plan section) Acute Rehab PT Goals Patient Stated Goal: to watch tv rigt now and go to new SNF at d/c PT Goal Formulation: All assessment and education complete, DC therapy    Frequency     Barriers to discharge        Co-evaluation               End of Session   Activity Tolerance: Patient limited by fatigue Patient left: in bed;with call bell/phone within reach;with bed alarm set Nurse Communication: Mobility status;Need for lift equipment;Precautions    Functional Assessment Tool Used: Clinical Judgement Functional Limitation: Mobility: Walking and moving around Mobility: Walking and Moving Around Current Status [phone]): At least 80 percent but less than 100 percent impaired, limited or restricted Mobility: Walking and Moving Around Goal Status [phone]): At least 80 percent but less than 100 percent impaired, limited or restricted Mobility: Walking and Moving Around Discharge Status [phone]): At least 80 percent but less than 100 percent impaired, limited or restricted    Time: 1425-1449 PT Time Calculation (min) (ACUTE ONLY): 24 min   Charges:   PT Evaluation $Initial PT Evaluation Tier I: 1 Procedure PT Treatments $Self Care/Home Management: 8-22   PT G Codes:   PT G-Codes **NOT FOR INPATIENT CLASS** Functional Assessment Tool Used: Clinical Judgement Functional Limitation: Mobility: Walking and moving around Mobility: Walking and Moving Around Current Status (O2518): At least 80 percent but less than 100 percent impaired, limited or restricted Mobility: Walking and Moving Around Goal Status [phone]): At least 80 percent but less than 100 percent impaired, limited or restricted Mobility: Walking and Moving Around Discharge Status [phone]): At least 80 percent but less than 100 percent impaired, limited or restricted   Joslyn Hy PT, DPT  [phone] Pager: [phone] 08/30/2015, 4:40 PM

## 2015-08-30 NOTE — Consult Note (Signed)
Consultation Note Date: 08/30/2015   Patient Name: Molly [name]  DOB: Jun 20, 1923  MRN: [id number]  Age / Sex: 81 y.o., female   PCP: Antony Blackbird, MD Referring Physician: Velvet Bathe, MD  Reason for Consultation: Establishing goals of care and Psychosocial/spiritual support  Palliative Care Assessment and Plan Summary of Established Goals of Care and Medical Treatment Preferences    Palliative Care Discussion Held Today:    This NP Wadie Lessen reviewed medical records, received report from team, assessed the patient and then meet at the patient's bedside along with her daughter Molly [name]  to discuss diagnosis prognosis, [place], EOL wishes disposition and options.  Concept of Hospice and Palliative Care were discussed  A detailed discussion was had today regarding advanced directives.  Concepts specific to code status, artifical feeding and hydration, continued IV antibiotics and rehospitalization was had.  The difference between a aggressive medical intervention path  and a palliative comfort care path for this patient at this time was had.  Values and goals of care important to patient and family were attempted to be elicited.  We discussed the limitations of medical options for this patient considering her current medical situation and co-morbidites.  Patient was able to express her wishes for comfort and dignity but frustration with care facility options.  A hospice facility would be first choice but unfortunately with a prognosis of greater tahn a few weeks she is not eligible.  Patient and family are refusing return to [facility].    Natural trajectory and expectations at EOL were discussed.  Questions and concerns addressed.  Hard Choices booklet left for review. Family encouraged to call with questions or concerns.  PMT will continue to support holistically.  MOST form introduced.  Faced with advanced directive decisions   Primary Decision Maker: patient with  the support of her daughter, daughter is HPOA   Goals of Care/Code Status/Advance Care Planning:   Code Status: DNR/DNI  Artificial feeding: not now or in the future  At this time patient is unable to shift to a full comfort path, she wishes to terat the treatable understanding the limitations of treatment options   Psycho-social/Spiritual:   Support System: daughter  Desire for further Chaplaincy support:yes  Prognosis: < 6 months  Discharge Planning:  Pending        Chief Complaint: Dizziness and syncope  History of Present Illness:   81 y.o. 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 Phoebe Sharps neurology office with chest pain after a syncopal episode.   In brief, Ms. Sepulveda had a stroke in September 2016 and received TPA. She was readmitted in October with progression of the stroke and while in the hospital had a second stroke. She was found to have bilateral severe carotid stenosis. During the same hospitalization she had a diverticular bleed. She developed very labile blood pressures.   Since discharge she has had 2 syncopal episodes at the SNF. She had a third syncopal episode today in the neurology out patient office. After her syncopal episode she developed chest pain. ate it is dull in nature.  In the ER her white count is 23.9 (this is baseline). Urine is positive for UA. Systolic blood pressure has ranged from 89-187. Pulse rate has ranged from 40-65.  Faced with advanced directive and anticipatory care needs    Primary Diagnoses  Present on Admission:  . Type 2 diabetes mellitus  with peripheral neuropathy ([place]) . Syncope and collapse . Orthostatic hypotension . HLD (hyperlipidemia) . Heart murmur, systolic . Intracranial carotid stenosis . CVA (cerebral vascular accident) ([place]) . CLL (chronic lymphocytic  leukemia) ([place]) . Breast cancer metastasized to bone ([place]) . Recurrent UTI  Palliative Review of Systems:    -weakness, dizziness   I have reviewed the medical record, interviewed the patient and family, and examined the patient. The following aspects are pertinent.  Past Medical History  Diagnosis Date  . Labile hypertension   . Aortic stenosis     a. Mild by echo 07/2015.  Marland Kitchen Hypothyroidism   . Anemia   . Hypercholesterolemia   . Heart murmur   . Type II diabetes mellitus ([place])   . Arthritis     "back; knees, hands" (05/11/2015)  . Breast cancer, right breast ([place])     "cells went into the blood and caused her hip to break" (05/11/2015)  . Stroke (cerebrum) ([place])     a. Stroke 07/2015 - received TPA and placed on DAPT; admit c/b diverticular bleed, strokes felt 2/2 hypoperfusion in setting of low BP with severe intracranial vascular disease.  . Intracranial vascular stenosis     a. severe bilateral intracranial vascular stenosis.  Marland Kitchen History of GI diverticular bleed 07/2015   Social History   Social History  . Marital Status: Widowed    Spouse Name: N/A  . Number of Children: N/A  . Years of Education: N/A   Social History Main Topics  . Smoking status: Never Smoker   . Smokeless tobacco: Never Used  . Alcohol Use: No  . Drug Use: No  . Sexual Activity: Not on file   Other Topics Concern  . Not on file   Social History Narrative   Family History  Problem Relation Age of Onset  . Hypertension Mother   . Heart attack Father   . Hypertension Father   . Diabetes Sister   . Hypertension Sister   . Liver cancer Sister   . Heart attack Brother   . Hypertension Brother   . Diabetes Brother   . Diabetes Sister   . Hypertension Sister    Scheduled Meds: . amLODipine  10 mg Oral Daily  . anastrozole  1 mg Oral Daily  . atenolol  25 mg Oral TID  . clopidogrel  75 mg Oral Daily  . enoxaparin (LOVENOX) injection  40 mg Subcutaneous Q24H  . ezetimibe  10 mg Oral  q1800  . insulin aspart  0-9 Units Subcutaneous TID WC  . insulin glargine  10 Units Subcutaneous Q1200  . irbesartan  300 mg Oral Daily  . levothyroxine  75 mcg Oral QAC breakfast  . polyethylene glycol  17 g Oral Daily  . sodium chloride  3 mL Intravenous Q12H  . sodium chloride  3 mL Intravenous Q12H  . sulfamethoxazole-trimethoprim  1 tablet Oral Q12H  . vitamin B-12  500 mcg Oral Q lunch   Continuous Infusions:  PRN Meds:.sodium chloride, acetaminophen **OR** acetaminophen, alum & mag hydroxide-simeth, hydrALAZINE, ondansetron **OR** ondansetron (ZOFRAN) IV, polyethylene glycol, sodium chloride Medications Prior to Admission:  Prior to Admission medications   Medication Sig Start Date End Date Taking? Authorizing Provider  ACCU-CHEK SMARTVIEW test strip  06/13/15  Yes Historical Provider, MD  amLODipine (NORVASC) 10 MG tablet Take 1 tablet (10 mg total) by mouth daily. 08/19/15  Yes Donzetta Starch, NP  anastrozole (ARIMIDEX) 1 MG tablet TAKE 1 TABLET BY MOUTH EVERY DAY 02/28/15  Yes Ladell Pier, MD  atenolol (TENORMIN) 50 MG tablet Take 1 tablet (50 mg total) by mouth 3 (three) times daily. 07/06/15  Yes Darlin Coco, MD  Calcium Carbonate-Vitamin D (CALCIUM + D PO) Take 1 tablet by mouth daily after lunch.    Yes Historical Provider, MD  cephALEXin (KEFLEX) 500 MG capsule Take 500 mg by mouth 2 (two) times daily.   Yes Historical Provider, MD  cloNIDine (CATAPRES) 0.1 MG tablet Take 1 tablet (0.1 mg total) by mouth 2 (two) times daily. 08/02/15  Yes David L Rinehuls, PA-C  Cyanocobalamin (B-12) 500 MCG TABS Take 500 mcg by mouth daily after lunch.    Yes Historical Provider, MD  ezetimibe (ZETIA) 10 MG tablet Take 1 tablet (10 mg total) by mouth daily. 09/07/14  Yes Darlin Coco, MD  FERROCITE 324 MG TABS TAKE 1 TABLET (106 MG OF IRON TOTAL) BY MOUTH DAILY. Patient taking differently: TAKE 1 TABLET (106 MG OF IRON TOTAL) BY MOUTH DAILY AFTER LUNCH 02/28/15  Yes Ladell Pier, MD   irbesartan (AVAPRO) 300 MG tablet Take 1 tablet (300 mg total) by mouth daily. 08/19/15  Yes Donzetta Starch, NP  LANTUS SOLOSTAR 100 UNIT/ML Solostar Pen Inject 10 Units into the skin daily at 12 noon. Patient taking differently: Inject 18 Units into the skin daily at 12 noon.  08/18/15  Yes Donzetta Starch, NP  levothyroxine (SYNTHROID, LEVOTHROID) 75 MCG tablet Take 75 mcg by mouth daily before breakfast.    Yes Historical Provider, MD  polyethylene glycol (MIRALAX / GLYCOLAX) packet Take 17 g by mouth daily. 08/18/15  Yes Donzetta Starch, NP  potassium chloride (KLOR-CON M10) 10 MEQ tablet Take 1 tablet (10 mEq total) by mouth daily after lunch. 08/18/15  Yes Donzetta Starch, NP  spironolactone (ALDACTONE) 25 MG tablet Take 0.5 tablets (12.5 mg total) by mouth daily. 08/18/15  Yes Donzetta Starch, NP   Allergies  Allergen Reactions  . Amlodipine Swelling    Currently taking exforge, separate med-Amlodipiine caused lower leg edema  . Contrast Media [Iodinated Diagnostic Agents] Nausea Only and Other (See Comments)    Patient experienced chills, nausea, hypothermic symptoms, also weird feelings  . Hctz [Hydrochlorothiazide] Other (See Comments)    Severe hypotension  . Hydralazine Other (See Comments)    Severe hypotension-suddenly and drastically dropped BP  . Metoprolol Other (See Comments)    Severe hypotension-suddenly and drastically dropped BP, same as hydralazine  . Codeine Other (See Comments)    Makes her feel "crazy."  . Demerol Other (See Comments)    Makes her feel "crazy."  . Librium Other (See Comments)    Makes her feel "crazy."  . Lipitor [Atorvastatin Calcium] Other (See Comments)    Makes her feel "crazy."   CBC:    Component Value Date/Time   WBC 23.9* 08/29/2015 1132   WBC 24.8* 02/07/2015 1432   HGB 14.4 08/29/2015 1132   HGB 12.5 02/07/2015 1432   HCT 43.1 08/29/2015 1132   HCT 38.0 02/07/2015 1432   PLT  08/29/2015 1132    PLATELET CLUMPS NOTED ON SMEAR, COUNT  APPEARS ADEQUATE   PLT 336 02/07/2015 1432   MCV 92.5 08/29/2015 1132   MCV 90.5 02/07/2015 1432   NEUTROABS 13.2* 08/29/2015 1132   NEUTROABS 10.3* 02/07/2015 1432   LYMPHSABS 9.8* 08/29/2015 1132   LYMPHSABS 13.2* 02/07/2015 1432   MONOABS 0.7 08/29/2015 1132   MONOABS 1.0* 02/07/2015 1432   EOSABS 0.2 08/29/2015 1132  EOSABS 0.3 02/07/2015 1432   BASOSABS 0.0 08/29/2015 1132   BASOSABS 0.1 02/07/2015 1432   Comprehensive Metabolic Panel:    Component Value Date/Time   NA 136 08/29/2015 1132   NA 136 09/22/2014 1426   K 4.1 08/29/2015 1132   K 3.8 09/22/2014 1426   CL 101 08/29/2015 1132   CO2 23 08/29/2015 1132   CO2 21* 09/22/2014 1426   BUN 17 08/29/2015 1132   BUN 11.4 09/22/2014 1426   CREATININE 0.91 08/29/2015 1132   CREATININE 0.9 09/22/2014 1426   GLUCOSE 170* 08/29/2015 1132   GLUCOSE 308* 09/22/2014 1426   CALCIUM 9.9 08/29/2015 1132   CALCIUM 9.5 09/22/2014 1426   AST 24 08/10/2015 0030   AST 14 09/22/2014 1426   ALT 22 08/10/2015 0030   ALT 17 09/22/2014 1426   ALKPHOS 80 08/10/2015 0030   ALKPHOS 90 09/22/2014 1426   BILITOT 0.6 08/10/2015 0030   BILITOT 0.23 09/22/2014 1426   PROT 6.3* 08/10/2015 0030   PROT 6.7 09/22/2014 1426   ALBUMIN 3.4* 08/10/2015 0030   ALBUMIN 3.4* 09/22/2014 1426    Physical Exam:  Vital Signs: BP 182/57 mmHg  Pulse 60  Temp(Src) 97.6 F (36.4 C) (Oral)  Resp 20  Ht 5\' 4"  (1.626 m)  Wt 98.975 kg (218 lb 3.2 oz)  BMI 37.44 kg/m2  SpO2 96% SpO2: SpO2: 96 % O2 Device: O2 Device: Not Delivered O2 Flow Rate:   Intake/output summary:  Intake/Output Summary (Last 24 hours) at 08/30/15 1008 Last data filed at 08/30/15 0430  Gross per 24 hour  Intake    120 ml  Output      0 ml  Net    120 ml   LBM: Last BM Date: 08/28/15 Baseline Weight: Weight: 81.194 kg (179 lb) Most recent weight: Weight: 98.975 kg (218 lb 3.2 oz)  Exam Findings:   General: chronically ill appearing, NAD HEENT: moist buccal membranes,  right facial droop CVS: rate 60, RRR Resp: CTA Abd: soft NT Extrem: Right sided hemiparesis Skin: noted skin breakdown per EMR Neuro: alert and oriented X3, engaged in conversation           Palliative Performance Scale: 30 % at best                Additional Data Reviewed: Recent Labs     08/29/15  1132  WBC  23.9*  HGB  14.4  PLT  PLATELET CLUMPS NOTED ON SMEAR, COUNT APPEARS ADEQUATE  NA  136  BUN  17  CREATININE  0.91     Time In: 0930 Time Out: 1100 Time Total: 90 min  Greater than 50%  of this time was spent counseling and coordinating care related to the above assessment and plan.  Discussed with  Eddie Dibbles and bedside nursing   Signed by: Wadie Lessen, NP  Knox Royalty, NP  08/30/2015, 10:08 AM  Please contact Palliative Medicine Team phone at [phone] for questions and concerns.   See AMION for contact information

## 2015-08-30 NOTE — Plan of Care (Signed)
Problem: Phase I Progression Outcomes Goal: Voiding-avoid urinary catheter unless indicated Outcome: Progressing Patient is incontinent of urine and stool. Declines to use bedpan. Unable to measure output.

## 2015-08-30 NOTE — Progress Notes (Addendum)
Patient: Molly [name] / Admit Date: 08/29/2015 / Date of Encounter: 08/30/2015, 8:23 AM   Subjective: No complaints today. No CP. No further dizziness. No recurrent syncope.   Objective: Telemetry: NSR/SB Physical Exam: Blood pressure 178/60, pulse 62, temperature 97.6 F (36.4 C), temperature source Oral, resp. rate 20, height 5\' 4"  (1.626 m), weight 218 lb 3.2 oz (98.975 kg), SpO2 97 %. General: Well developed elderly WF in no acute distress. Head: Normocephalic, atraumatic, sclera non-icteric, no xanthomas, nares are without discharge. Neck: JVP not elevated. Lungs: Clear bilaterally to auscultation without wheezes, rales, or rhonchi. Breathing is unlabored. Heart: RRR S1 S2 with soft SEM at RUSB. No rubs or gallops.  Abdomen: Soft, non-tender, non-distended with normoactive bowel sounds. No rebound/guarding. Extremities: No clubbing or cyanosis. No edema. Distal pedal pulses are 2+ and equal bilaterally. Neuro: Alert and oriented X 3. Dense hemiparesis right side with right facial droop and right sided weakness. Psych:  Responds to questions appropriately with a normal affect.   Intake/Output Summary (Last 24 hours) at 08/30/15 0823 Last data filed at 08/30/15 0430  Gross per 24 hour  Intake    120 ml  Output      0 ml  Net    120 ml    Inpatient Medications:  . amLODipine  10 mg Oral Daily  . anastrozole  1 mg Oral Daily  . atenolol  50 mg Oral TID  . clopidogrel  75 mg Oral Daily  . enoxaparin (LOVENOX) injection  40 mg Subcutaneous Q24H  . ezetimibe  10 mg Oral q1800  . insulin aspart  0-9 Units Subcutaneous TID WC  . insulin glargine  10 Units Subcutaneous Q1200  . irbesartan  300 mg Oral Daily  . levothyroxine  75 mcg Oral QAC breakfast  . polyethylene glycol  17 g Oral Daily  . sodium chloride  3 mL Intravenous Q12H  . sodium chloride  3 mL Intravenous Q12H  . sulfamethoxazole-trimethoprim  1 tablet Oral Q12H  . vitamin B-12  500 mcg Oral Q lunch    Infusions:    Labs:  Recent Labs  08/29/15 1132  NA 136  K 4.1  CL 101  CO2 23  GLUCOSE 170*  BUN 17  CREATININE 0.91  CALCIUM 9.9   No results for input(s): AST, ALT, ALKPHOS, BILITOT, PROT, ALBUMIN in the last 72 hours.  Recent Labs  08/29/15 1132  WBC 23.9*  NEUTROABS 13.2*  HGB 14.4  HCT 43.1  MCV 92.5  PLT PLATELET CLUMPS NOTED ON SMEAR, COUNT APPEARS ADEQUATE    Recent Labs  08/29/15 1703 08/29/15 2239 08/30/15 0448  TROPONINI <0.03 <0.03 0.03    Radiology/Studies:  Dg Chest 2 View  08/29/2015  CLINICAL DATA:  Syncopal episode. EXAM: CHEST  2 VIEW COMPARISON:  08/20/2015 FINDINGS: The heart size and mediastinal contours are within normal limits. Both lungs are clear. No evidence of pneumothorax or pleural effusion . The visualized skeletal structures are unremarkable. IMPRESSION: No active cardiopulmonary disease. Electronically Signed   By: Earle Gell M.D.   On: 08/29/2015 12:55   Dg Chest Port 1 View  08/20/2015  CLINICAL DATA:  81 year old with acute onset of blurred vision, generalized weakness and confusion earlier today while at the nursing home. EXAM: PORTABLE CHEST 1 VIEW COMPARISON:  08/10/2015 and earlier. FINDINGS: Cardiac silhouette moderately enlarged, unchanged. Linear atelectasis involving the left lung base. Lungs otherwise clear. No localized airspace consolidation. No pleural effusions. No pneumothorax. Normal pulmonary vascularity. IMPRESSION: Stable cardiomegaly. Linear  atelectasis involving the left lung base. No acute cardiopulmonary disease otherwise. Electronically Signed   By: Evangeline Dakin M.D.   On: 08/20/2015 17:39       Assessment and Plan  81 y/o F with labile HTN, HLD, right-sided hemiplegia s/p CVA, recent diverticular bleed, DM, severe bilateral intracranial carotid stenosis, breast cancer with metastasis to bone, CLL with chronic leukocytosis, and mild aortic stenosis presented with chest pain and syncope. Stroke 07/2015  - received TPA and placed on Plavix; admit c/b diverticular bleed, strokes felt 2/2 hypoperfusion in setting of low BP with severe intracranial vascular disease. Most recent echo 07/2015: EF 65-70%, G1DD, no RWMA, mild AS.  1. Chest pain - ruled out for MI. May be due to labile BPs. EKG with chronic RBBB + associated changes. Suspect continued supportive care in setting of multiple comorbidities is most appropriate.  2. H/o syncope/pre-syncope - etiology not totally clear, possibly related to hypotension in neurology office. BB reduced due to sinus bradycardia. Can consider event monitoring at discharge although per consult note, would not be a candidate for a pacemaker given advanced age, metastatic cancer. Will ask Dr. Mare Ferrari to weigh in.  3. Labile HTN with intermittent hypotension - this is not a new issue. She was taking amlodipine 10mg , atenolol 50mg  TID, clonidine 0.1mg  BID, irbesartan 300mg  qd, and spironolactone 12.5 mg qd prior to admission. Per Dr. Phoebe Sharps most recent note, recent CVAs most likely due to hypoperfusion 2/2 hypotension. She may need permissive HTN to maintain cerebral perfusion. IM has held spironolactone and clonidine for permissive BP up to 580 systolic which is definitely reasonable in a 81 year old woman who is approaching comfort care. Atenolol is also being reduced. If BP rises further given decrease in atenolol, would consider readdition of spironolactone.  4. Sinus bradycardia - atenolol decreased as above.  5. Aortic stenosis - mild by recent echocardiogram.  Signed, Melina Copa PA-C Pager: [phone]  Agree with assessment and plan as noted above. Telemetry reviewed. Sinus bradycardia has improved with reduction in dose of atenolol. Allowable permissive hypertension up to 505 systolic. She denies any chest pain. No dyspnea. Physical exam as above. No evidence of fluid overload. Lungs clear. No edema. Good pedal pulses. Dense paresis of right upper extremity. She is  not a candidate for PPM because of multiple comorbidities.

## 2015-08-30 NOTE — Progress Notes (Signed)
OT Cancellation Note  Patient Details Name: Molly [name] MRN: [id number] DOB: [birth year]   Cancelled Treatment:    Reason Eval/Treat Not Completed:  (OT screened) Pt's current D/C plan is SNF and she is from SNF. No apparent immediate acute care OT needs, therefore will defer OT to SNF. If OT eval is needed please call Acute Rehab Dept. at [phone] or text page OT at [phone].    Benito Mccreedy OTR/L [phone] 08/30/2015, 12:10 PM

## 2015-08-30 NOTE — Clinical Social Work Note (Signed)
Clinical Social Work Assessment  Patient Details  Name: Molly [name] MRN: [id number] Date of Birth: [birth year]  Date of referral:  08/30/15               Reason for consult:  Facility Placement                Permission sought to share information with:  Chartered certified accountant granted to share information::  Yes, Verbal Permission Granted  Name::      Molly [name]  Agency::   [facility]  Relationship::   Daughter  Contact Information:     Housing/Transportation Living arrangements for the past 2 months:  Apartment Source of Information:  Patient, Adult Children Patient Interpreter Needed:  None Criminal Activity/Legal Involvement Pertinent to Current Situation/Hospitalization:  No - Comment as needed Significant Relationships:  Adult Children (Daughter Molly [name]) Lives with:  Was living alone but daughter moved in with patient when she became ill Do you feel safe going back to the place where you live?  Yes Need for family participation in patient care:  No (Coment)  Care giving concerns: Pt was living alone and does not have 24 hour supervision/needed physical assistance  at this time.   Social Worker assessment / plan: BSW entered room with [facility]. Pt was alert and oriented with daughter Molly [name] by her bedside. BSW intern and Celene Kras explain recommendation for SNF and the referral process.  Employment status:  Retired Nurse, adult PT Recommendations:  [place] / Referral to community resources:  [place]  Patient/Family's Response to care: Marland Kitchen Pt daughter and Pt are agreeable with current plan but  Pt does not want to go back to [facility].  Patient/Family's Understanding of and Emotional Response to Diagnosis, Current Treatment, and Prognosis: Pt daughter and Pt has good insight on Pt condition and does not have any concerns at this time.  Emotional  Assessment Appearance:  Appears stated age Attitude/Demeanor/Rapport:  Other (responsive, pleaseant, kind) Affect (typically observed):  Pleasant, Accepting Orientation:  Oriented to Self, Oriented to Place, Oriented to  Time, Oriented to Situation Alcohol / Substance use:  Never Used Psych involvement (Current and /or in the community):  No (Comment)  Discharge Needs  Concerns to be addressed:  No discharge needs identified Readmission within the last 30 days:  No Current discharge risk:  None Barriers to Discharge:  No Barriers Identified   Soledad Gerlach, Student-SW 08/30/2015, 12:38 PM  [place] intern  [phone]

## 2015-08-30 NOTE — Progress Notes (Signed)
[facility] PROGRESS NOTE  KATRIANA DORTCH HKV:[id number] DOB: 07-Nov-1923 DOA: 08/29/2015 PCP: Antony Blackbird, MD  Assessment/Plan: Principal Problem:   Syncope and collapse -Most likely secondary to orthostatic hypotension from labile BPs  Active Problems:   Breast cancer metastasized to bone (HCC) - Pt to f/u with oncologist after d/c and continue routine f/u    Type 2 diabetes mellitus with peripheral neuropathy ([place]) -Continue sliding scale insulin    Heart murmur, systolic -Stable    CVA (cerebral vascular accident) ([place]) -Continue Plavix, Cynthia,    Intracranial carotid stenosis -Stable most likely contributing to syncopal episode    Orthostatic hypotension - Most likely cause of syncope and collapse in context of patient with labile blood pressure with history of bilateral ICA stenosis. - Cardiology assisting with blood pressure management.    HLD (hyperlipidemia) - Stable no chest pain    Recurrent UTI - Placed on Bactrim - Follow-up with urine culture    Pressure ulcer - Continue wound care  Code Status: DO NOT RESUSCITATE Family Communication: None at bedside Disposition Plan: Pending improvement in blood pressures, we'll obtain physical therapy   Consultants:  Physical therapy,  Cardiology  Procedures:  None  Antibiotics:  bactrim  HPI/Subjective: Pt has no new complaints.   Objective: Filed Vitals:   08/30/15 1135  BP: 184/52  Pulse:   Temp: 97.5 F (36.4 C)  Resp: 21    Intake/Output Summary (Last 24 hours) at 08/30/15 1557 Last data filed at 08/30/15 1235  Gross per 24 hour  Intake    720 ml  Output      0 ml  Net    720 ml   Filed Weights   08/29/15 1046 08/29/15 1637 08/30/15 0454  Weight: 81.194 kg (179 lb) 69.174 kg (152 lb 8 oz) 98.975 kg (218 lb 3.2 oz)    Exam:   General:  Pt in nad, alert and awake  Cardiovascular: s1 and s2, no rubs  Respiratory: No increased work of breathing, equal chest rise,  no wheezes  Abdomen: Soft, nondistended, nontender  Musculoskeletal: No cyanosis or clubbing   Data Reviewed: Basic Metabolic Panel:  Recent Labs Lab 08/29/15 1132  NA 136  K 4.1  CL 101  CO2 23  GLUCOSE 170*  BUN 17  CREATININE 0.91  CALCIUM 9.9   Liver Function Tests: No results for input(s): AST, ALT, ALKPHOS, BILITOT, PROT, ALBUMIN in the last 168 hours. No results for input(s): LIPASE, AMYLASE in the last 168 hours. No results for input(s): AMMONIA in the last 168 hours. CBC:  Recent Labs Lab 08/29/15 1132  WBC 23.9*  NEUTROABS 13.2*  HGB 14.4  HCT 43.1  MCV 92.5  PLT PLATELET CLUMPS NOTED ON SMEAR, COUNT APPEARS ADEQUATE   Cardiac Enzymes:  Recent Labs Lab 08/29/15 1703 08/29/15 2239 08/30/15 0448  TROPONINI <0.03 <0.03 0.03   BNP (last 3 results) No results for input(s): BNP in the last 8760 hours.  ProBNP (last 3 results) No results for input(s): PROBNP in the last 8760 hours.  CBG:  Recent Labs Lab 08/29/15 1757 08/29/15 2233 08/30/15 0553 08/30/15 0635 08/30/15 1145  GLUCAP 153* 148* 166* 173* 156*    Recent Results (from the past 240 hour(s))  Urine culture     Status: None (Preliminary result)   Collection Time: 08/29/15 12:00 PM  Result Value Ref Range Status   Specimen Description URINE, CLEAN CATCH  Final   Special Requests NONE  Final   Culture CULTURE REINCUBATED FOR BETTER  GROWTH  Final   Report Status PENDING  Incomplete  MRSA PCR Screening     Status: None   Collection Time: 08/29/15  6:44 PM  Result Value Ref Range Status   MRSA by PCR NEGATIVE NEGATIVE Final    Comment:        The GeneXpert MRSA Assay (FDA approved for NASAL specimens only), is one component of a comprehensive MRSA colonization surveillance program. It is not intended to diagnose MRSA infection nor to guide or monitor treatment for MRSA infections.      Studies: Dg Chest 2 View  08/29/2015  CLINICAL DATA:  Syncopal episode. EXAM: CHEST   2 VIEW COMPARISON:  08/20/2015 FINDINGS: The heart size and mediastinal contours are within normal limits. Both lungs are clear. No evidence of pneumothorax or pleural effusion . The visualized skeletal structures are unremarkable. IMPRESSION: No active cardiopulmonary disease. Electronically Signed   By: Earle Gell M.D.   On: 08/29/2015 12:55    Scheduled Meds: . amLODipine  10 mg Oral Daily  . anastrozole  1 mg Oral Daily  . atenolol  25 mg Oral TID  . clopidogrel  75 mg Oral Daily  . enoxaparin (LOVENOX) injection  40 mg Subcutaneous Q24H  . ezetimibe  10 mg Oral q1800  . insulin aspart  0-9 Units Subcutaneous TID WC  . insulin glargine  10 Units Subcutaneous Q1200  . irbesartan  300 mg Oral Daily  . levothyroxine  75 mcg Oral QAC breakfast  . polyethylene glycol  17 g Oral Daily  . sodium chloride  3 mL Intravenous Q12H  . sodium chloride  3 mL Intravenous Q12H  . sulfamethoxazole-trimethoprim  1 tablet Oral Q12H  . vitamin B-12  500 mcg Oral Q lunch   Continuous Infusions:   Time spent: 20 minutes  Velvet Bathe  [facility] Pager [phone]. If 7PM-7AM, please contact night-coverage at [email or web address], password [facility] 08/30/2015, 3:57 PM

## 2015-08-30 NOTE — Consult Note (Signed)
WOC wound consult note Reason for Consult: Consult requested for sacrum  Wound type: Stage 3 pressure injury; daughter at bedside states it has been there "awhile." Pressure Ulcer POA: Yes Measurement: .3X.3X.2cm Wound bed: red and moist Drainage (amount, consistency, odor) Scant amt yellow drainage, no odor Periwound: Intact skin surrounding Dressing procedure/placement/frequency: Foam dressing to protect and promote healing.  Pt is frequently incontinent of stool and urine; it is difficult to keep dressing from becoming soiled. Please re-consult if further assistance is needed.  Thank-you,  Julien Girt MSN, [place], [place], [place], [place]

## 2015-08-30 NOTE — Evaluation (Signed)
Clinical/Bedside Swallow Evaluation Patient Details  Name: Molly [name] MRN: [id number] Date of Birth: Oct 29, 1923  Today's Date: 08/30/2015 Time: SLP Start Time (ACUTE ONLY): 9924 SLP Stop Time (ACUTE ONLY): 0828 SLP Time Calculation (min) (ACUTE ONLY): 16 min  Past Medical History:  Past Medical History  Diagnosis Date  . Labile hypertension   . Aortic stenosis     a. Mild by echo 07/2015.  Marland Kitchen Hypothyroidism   . Anemia   . Hypercholesterolemia   . Heart murmur   . Type II diabetes mellitus ([place])   . Arthritis     "back; knees, hands" (05/11/2015)  . Breast cancer, right breast ([place])     "cells went into the blood and caused her hip to break" (05/11/2015)  . Stroke (cerebrum) ([place])     a. Stroke 07/2015 - received TPA and placed on DAPT; admit c/b diverticular bleed, strokes felt 2/2 hypoperfusion in setting of low BP with severe intracranial vascular disease.  . Intracranial vascular stenosis     a. severe bilateral intracranial vascular stenosis.  Marland Kitchen History of GI diverticular bleed 07/2015   Past Surgical History:  Past Surgical History  Procedure Laterality Date  . Hemiarthroplasty hip Right     "partial replacement"  . Pelvic and para-aortic lymph node dissection    . Tonsillectomy    . Appendectomy    . Cholecystectomy    . US echocardiography  03/30/2009    EF 55-60%  . Cardiovascular stress test  01/28/2008    EF 74%  . Esophagogastroduodenoscopy N/A 07/08/2014    Procedure: ESOPHAGOGASTRODUODENOSCOPY (EGD);  Surgeon: Winfield Cunas., MD;  Location: [facility];  Service: Endoscopy;  Laterality: N/A;  . Colonoscopy N/A 07/10/2014    Procedure: COLONOSCOPY;  Surgeon: Lear Ng, MD;  Location: [facility] END[facility]COPY;  Service: Endoscopy;  Laterality: N/A;  . Total abdominal hysterectomy      w/BSO  . Dilation and curettage of uterus    . Breast biopsy Right 2009  . Breast lumpectomy Right 2009  . Fracture surgery    . Cataract extraction w/ intraocular lens   implant, bilateral Bilateral    HPI:  81 y.o. female, with right-sided hemiplegia from previous stroke, recent diverticular bleed, recurrent UTIs, diabetes mellitus, and bilateral intracranial carotid stenosis and CVA 07/2015, re-admitted October 2016 with progression of stroke and while in hospital had a second stroke. CXR No active cardiopulmonary disease. BSE 08/10/15 recommended regular and thin and downgraded to Dys 3 9/30. EGD 07/08/14 there was a ring/stricture right at the GE junction that was easily passed and was widely patent.   Assessment / Plan / Recommendation Clinical Impression  Significant right buccal cavity pocketing with soft solids (eggs) with mild-mod vebal and visual cueing to remove. Labial spill due to decreased strength and control. Provided verbal cueing for smaller bite size and place food on left side or oral cavity. Pt able to remove pocketed solids once cued, No indication of aspiration however oral dysaphagia places her at greater risk. SLP will decrease to Dys 3 and continue thin liquids, recommend pills whole in applesauce and continued ST.     Aspiration Risk   (mild-mod)    Diet Recommendation Dysphagia 3 (Mech soft);Thin   Medication Administration: Whole meds with puree Compensations: Slow rate;Small sips/bites;Check for pocketing    Other  Recommendations Oral Care Recommendations: Oral care BID   Follow Up Recommendations       Frequency and Duration min 2x/week  2 weeks   Pertinent  Vitals/Pain none    SLP Swallow Goals     Swallow Study Prior Functional Status       General Other Pertinent Information: 81 y.o. female, with right-sided hemiplegia from previous stroke, recent diverticular bleed, recurrent UTIs, diabetes mellitus, and bilateral intracranial carotid stenosis and CVA 07/2015, re-admitted October 2016 with progression of stroke and while in hospital had a second stroke. CXR No active cardiopulmonary disease. BSE 08/10/15 recommended  regular and thin and downgraded to Dys 3 9/30. EGD 07/08/14 there was a ring/stricture right at the GE junction that was easily passed and was widely patent. Type of Study: Bedside swallow evaluation Previous Swallow Assessment:  (see HPI) Diet Prior to this Study: Regular;Thin liquids Temperature Spikes Noted: No Respiratory Status: Room air History of Recent Intubation: No Behavior/Cognition: Alert;Cooperative;Requires cueing Oral Cavity - Dentition: Adequate natural dentition/normal for age Self-Feeding Abilities: Able to feed self;Needs set up;Needs assist Patient Positioning: Upright in bed Baseline Vocal Quality: Normal Volitional Cough: Strong Volitional Swallow: Able to elicit    Oral/Motor/Sensory Function Overall Oral Motor/Sensory Function: Impaired at baseline Labial ROM: Reduced right Labial Symmetry: Abnormal symmetry right Labial Strength: Reduced Lingual ROM: Reduced right Lingual Symmetry: Abnormal symmetry right Lingual Strength: Reduced Facial ROM: Reduced right Facial Symmetry: Right droop Facial Strength: Reduced Mandible: Within Functional Limits   Ice Chips Ice chips: Not tested   Thin Liquid Thin Liquid: Within functional limits Presentation: Cup    Nectar Thick Nectar Thick Liquid: Not tested   Honey Thick Honey Thick Liquid: Not tested   Puree Puree: Impaired Presentation: Self Fed;Spoon Oral Phase Impairments: Impaired anterior to posterior transit Oral Phase Functional Implications: Right lateral sulci pocketing;Prolonged oral transit;Right anterior spillage Pharyngeal Phase Impairments:  (none)   Solid   GO Functional Assessment Tool Used:  (skilled clinical judgement) Functional Limitations: Swallowing Swallow Current Status (Q7591): At least 20 percent but less than 40 percent impaired, limited or restricted Swallow Goal Status [phone]): At least 1 percent but less than 20 percent impaired, limited or restricted  Solid: Impaired Presentation:  Self Fed Oral Phase Impairments: Impaired anterior to posterior transit Oral Phase Functional Implications: Right anterior spillage;Right lateral sulci pocketing;Oral residue Pharyngeal Phase Impairments:  (none)       Jaymes Revels, Orbie Pyo 08/30/2015,8:58 AM  Orbie Pyo Colvin Caroli.[employer] [phone]

## 2015-08-30 NOTE — Clinical Social Work Placement (Signed)
   CLINICAL SOCIAL WORK PLACEMENT  NOTE  Date:  08/30/2015  Patient Details  Name: ADIVA BOETTNER MRN: [id number] Date of Birth: 1923/05/08  Clinical Social Work is seeking post-discharge placement for this patient at the [place] level of care (*CSW will initial, date and re-position this form in  chart as items are completed):  Yes   Patient/family provided with [place] Work Department's list of facilities offering this level of care within the geographic area requested by the patient (or if unable, by the patient's family).  Yes   Patient/family informed of their freedom to choose among providers that offer the needed level of care, that participate in Medicare, Medicaid or managed care program needed by the patient, have an available bed and are willing to accept the patient.  Yes   Patient/family informed of [facility]'s ownership interest in [facility] and [facility], as well as of the fact that they are under no obligation to receive care at these facilities.  PASRR submitted to EDS on       PASRR number received on       Existing PASRR number confirmed on 08/30/15     FL2 transmitted to all facilities in geographic area requested by pt/family on 08/30/15     FL2 transmitted to all facilities within larger geographic area on       Patient informed that his/her managed care company has contracts with or will negotiate with certain facilities, including the following:            Patient/family informed of bed offers received.  Patient chooses bed at       Physician recommends and patient chooses bed at      Patient to be transferred to   on  .  Patient to be transferred to facility by       Patient family notified on   of transfer.  Name of family member notified:        PHYSICIAN Please sign FL2, Please sign DNR     Additional Comment:    _______________________________________________ Cranford Mon,  LCSW 08/30/2015, 12:15 PM

## 2015-08-31 ENCOUNTER — Other Ambulatory Visit: Payer: Self-pay | Admitting: Licensed Clinical Social Worker

## 2015-08-31 DIAGNOSIS — N390 Urinary tract infection, site not specified: Secondary | ICD-10-CM

## 2015-08-31 DIAGNOSIS — I63032 Cerebral infarction due to thrombosis of left carotid artery: Secondary | ICD-10-CM

## 2015-08-31 DIAGNOSIS — R55 Syncope and collapse: Secondary | ICD-10-CM | POA: Diagnosis not present

## 2015-08-31 DIAGNOSIS — I951 Orthostatic hypotension: Secondary | ICD-10-CM | POA: Diagnosis not present

## 2015-08-31 DIAGNOSIS — E1142 Type 2 diabetes mellitus with diabetic polyneuropathy: Secondary | ICD-10-CM

## 2015-08-31 DIAGNOSIS — E785 Hyperlipidemia, unspecified: Secondary | ICD-10-CM

## 2015-08-31 DIAGNOSIS — L8990 Pressure ulcer of unspecified site, unspecified stage: Secondary | ICD-10-CM

## 2015-08-31 DIAGNOSIS — C50919 Malignant neoplasm of unspecified site of unspecified female breast: Secondary | ICD-10-CM | POA: Diagnosis not present

## 2015-08-31 LAB — BASIC METABOLIC PANEL
ANION GAP: 14 (ref 5–15)
BUN: 11 mg/dL (ref 6–20)
CALCIUM: 9.5 mg/dL (ref 8.9–10.3)
CHLORIDE: 100 mmol/L — AB (ref 101–111)
CO2: 21 mmol/L — AB (ref 22–32)
Creatinine, Ser: 0.99 mg/dL (ref 0.44–1.00)
GFR calc Af Amer: 56 mL/min — ABNORMAL LOW (ref >60)
GFR calc non Af Amer: 49 mL/min — ABNORMAL LOW (ref >60)
GLUCOSE: 181 mg/dL — AB (ref 65–99)
Potassium: 3.9 mmol/L (ref 3.5–5.1)
Sodium: 135 mmol/L (ref 135–145)

## 2015-08-31 LAB — CBC WITH DIFFERENTIAL/PLATELET
Basophils Absolute: 0 10*3/uL (ref 0.0–0.1)
Basophils Relative: 0 %
EOS PCT: 1 %
Eosinophils Absolute: 0.2 10*3/uL (ref 0.0–0.7)
HEMATOCRIT: 39.6 % (ref 36.0–46.0)
HEMOGLOBIN: 13.2 g/dL (ref 12.0–15.0)
LYMPHS ABS: 9.7 10*3/uL — AB (ref 0.7–4.0)
Lymphocytes Relative: 43 %
MCH: 30.5 pg (ref 26.0–34.0)
MCHC: 33.3 g/dL (ref 30.0–36.0)
MCV: 91.5 fL (ref 78.0–100.0)
MONOS PCT: 5 %
Monocytes Absolute: 1.1 10*3/uL — ABNORMAL HIGH (ref 0.1–1.0)
NEUTROS ABS: 11.6 10*3/uL — AB (ref 1.7–7.7)
Neutrophils Relative %: 51 %
Platelets: 325 10*3/uL (ref 150–400)
RBC: 4.33 MIL/uL (ref 3.87–5.11)
RDW: 14.4 % (ref 11.5–15.5)
WBC: 22.6 10*3/uL — ABNORMAL HIGH (ref 4.0–10.5)

## 2015-08-31 LAB — GLUCOSE, CAPILLARY
GLUCOSE-CAPILLARY: 207 mg/dL — AB (ref 65–99)
GLUCOSE-CAPILLARY: 176 mg/dL — AB (ref 65–99)
Glucose-Capillary: 178 mg/dL — ABNORMAL HIGH (ref 65–99)
Glucose-Capillary: 172 mg/dL — ABNORMAL HIGH (ref 65–99)

## 2015-08-31 LAB — URINE CULTURE

## 2015-08-31 NOTE — Patient Outreach (Signed)
[facility]) Care Management  08/31/2015  ANTANIA HOEFLING Aug 18, 1923 [id number]   Assessment-CSW contacted patient's daughter on 08/31/15 as CSW became aware that patient is currently in the hospital. CSW received HIPPA verifications from patient's daughter [name]. [name] reports that she does not wish for patient to go back to [facility]F post discharge but that patient has received multiple denials for placement elsewhere besides one SNF that she reports is too far away. [name] agreed to contact CSW with updates after patient is discharged from hospital. [name] denies reading over personal care resources that CSW provided during initial visit. [name] shares "I'm not still not sure what we are going to do once she gets home. It is too much to think about right now." CSW will continue to encourage and motivate patient and patient's family to be involved with appropriate discharge planning.   Plan-CSW will remain available to patient and patient's family and will assist with safe and stable discharge planning back home.  Eula Fried, BSW, MSW, [email or web address] Phone: [phone] Fax: [phone]

## 2015-08-31 NOTE — Progress Notes (Signed)
CSW met with patient and daughter [name] multiple times today to discuss current bed offers as they do not wish to return to [facility].  Daughter is going to visit [facility] [place] this afternoon and [facility] in the morning. CSW discussed with Dr. Ree Kida- anticipate d/c tomorrow to SNF if medically stable. CSW will follow up with daughter in the morning re: bed choice.  Patient is agreeable to either facility and defers to her daughter for decision making.  Lorie Phenix. Pauline Good, [place]

## 2015-08-31 NOTE — ED Provider Notes (Signed)
CSN: [id number]     Arrival date & time 08/29/15  1027 History   First MD Initiated Contact with Patient 08/29/15 1028     Chief Complaint  Patient presents with  . Near Syncope     (Consider location/radiation/quality/duration/timing/severity/associated sxs/prior Treatment) HPI  Molly [name] is a 81 y.o. F with a pmhx of recent CVA with right-sided hemiplegia from, recent diverticular bleed, recurrent UTIs, diabetes mellitus, and bilateral intracranial carotid stenosis presents to the ER from Dr. Phoebe Sharps neurology office with chest pain after a syncopal episode. Pt had a stoke 9/16 for which she received TPA and was discharged to a rehab facility. However, pt was having worseing neurological symptoms and a labile BP and was readmitted. At this time it was determined that she had a larger stroke. While in the hospital the pt's BP was difficult to control and was subsequently placed on 4 BP medications and d/c to SNF. At SNF pt has had several episodes of syncope due to orthostatic hypotension. Pt was seen today at Dr. Phoebe Sharps office where she again had an episode of syncope and her BP was found to be 80/50.  After her syncopal episode she developed chest pain. The patient says she has similar chest pain whenever she takes her BP medication at the same time. While in the ED pt denies current CP, dizziness, SOB, dysuria, new numbness/tingling, headache, slurred speech.    Past Medical History  Diagnosis Date  . Labile hypertension   . Aortic stenosis     a. Mild by echo 07/2015.  Marland Kitchen Hypothyroidism   . Anemia   . Hypercholesterolemia   . Heart murmur   . Type II diabetes mellitus ([place])   . Arthritis     "back; knees, hands" (05/11/2015)  . Breast cancer, right breast ([place])     "cells went into the blood and caused her hip to break" (05/11/2015)  . Stroke (cerebrum) ([place])     a. Stroke 07/2015 - received TPA and placed on DAPT; admit c/b diverticular bleed, strokes felt 2/2 hypoperfusion in  setting of low BP with severe intracranial vascular disease.  . Intracranial vascular stenosis     a. severe bilateral intracranial vascular stenosis.  Marland Kitchen History of GI diverticular bleed 07/2015   Past Surgical History  Procedure Laterality Date  . Hemiarthroplasty hip Right     "partial replacement"  . Pelvic and para-aortic lymph node dissection    . Tonsillectomy    . Appendectomy    . Cholecystectomy    . US echocardiography  03/30/2009    EF 55-60%  . Cardiovascular stress test  01/28/2008    EF 74%  . Esophagogastroduodenoscopy N/A 07/08/2014    Procedure: ESOPHAGOGASTRODUODENOSCOPY (EGD);  Surgeon: Winfield Cunas., MD;  Location: [facility];  Service: Endoscopy;  Laterality: N/A;  . Colonoscopy N/A 07/10/2014    Procedure: COLONOSCOPY;  Surgeon: Lear Ng, MD;  Location: [facility];  Service: Endoscopy;  Laterality: N/A;  . Total abdominal hysterectomy      w/BSO  . Dilation and curettage of uterus    . Breast biopsy Right 2009  . Breast lumpectomy Right 2009  . Fracture surgery    . Cataract extraction w/ intraocular lens  implant, bilateral Bilateral    Family History  Problem Relation Age of Onset  . Hypertension Mother   . Heart attack Father   . Hypertension Father   . Diabetes Sister   . Hypertension Sister   . Liver cancer Sister   .  Heart attack Brother   . Hypertension Brother   . Diabetes Brother   . Diabetes Sister   . Hypertension Sister    Social History  Substance Use Topics  . Smoking status: Never Smoker   . Smokeless tobacco: Never Used  . Alcohol Use: No   OB History    No data available     Review of Systems  All other systems reviewed and are negative.     Allergies  Amlodipine; Contrast media; Hctz; Hydralazine; Metoprolol; Codeine; Demerol; Librium; and Lipitor  Home Medications   Prior to Admission medications   Medication Sig Start Date End Date Taking? Authorizing Provider  ACCU-CHEK SMARTVIEW test strip   06/13/15  Yes Historical Provider, MD  amLODipine (NORVASC) 10 MG tablet Take 1 tablet (10 mg total) by mouth daily. 08/19/15  Yes Donzetta Starch, NP  anastrozole (ARIMIDEX) 1 MG tablet TAKE 1 TABLET BY MOUTH EVERY DAY 02/28/15  Yes Ladell Pier, MD  atenolol (TENORMIN) 50 MG tablet Take 1 tablet (50 mg total) by mouth 3 (three) times daily. 07/06/15  Yes Darlin Coco, MD  Calcium Carbonate-Vitamin D (CALCIUM + D PO) Take 1 tablet by mouth daily after lunch.    Yes Historical Provider, MD  cephALEXin (KEFLEX) 500 MG capsule Take 500 mg by mouth 2 (two) times daily.   Yes Historical Provider, MD  cloNIDine (CATAPRES) 0.1 MG tablet Take 1 tablet (0.1 mg total) by mouth 2 (two) times daily. 08/02/15  Yes David L Rinehuls, PA-C  Cyanocobalamin (B-12) 500 MCG TABS Take 500 mcg by mouth daily after lunch.    Yes Historical Provider, MD  ezetimibe (ZETIA) 10 MG tablet Take 1 tablet (10 mg total) by mouth daily. 09/07/14  Yes Darlin Coco, MD  FERROCITE 324 MG TABS TAKE 1 TABLET (106 MG OF IRON TOTAL) BY MOUTH DAILY. Patient taking differently: TAKE 1 TABLET (106 MG OF IRON TOTAL) BY MOUTH DAILY AFTER LUNCH 02/28/15  Yes Ladell Pier, MD  irbesartan (AVAPRO) 300 MG tablet Take 1 tablet (300 mg total) by mouth daily. 08/19/15  Yes Donzetta Starch, NP  LANTUS SOLOSTAR 100 UNIT/ML Solostar Pen Inject 10 Units into the skin daily at 12 noon. Patient taking differently: Inject 18 Units into the skin daily at 12 noon.  08/18/15  Yes Donzetta Starch, NP  levothyroxine (SYNTHROID, LEVOTHROID) 75 MCG tablet Take 75 mcg by mouth daily before breakfast.    Yes Historical Provider, MD  polyethylene glycol (MIRALAX / GLYCOLAX) packet Take 17 g by mouth daily. 08/18/15  Yes Donzetta Starch, NP  potassium chloride (KLOR-CON M10) 10 MEQ tablet Take 1 tablet (10 mEq total) by mouth daily after lunch. 08/18/15  Yes Donzetta Starch, NP  spironolactone (ALDACTONE) 25 MG tablet Take 0.5 tablets (12.5 mg total) by mouth daily. 08/18/15   Yes Donzetta Starch, NP   BP 159/53 mmHg  Pulse 66  Temp(Src) 98.3 F (36.8 C) (Oral)  Resp 20  Ht 5\' 4"  (1.626 m)  Wt 218 lb 8.6 oz (99.129 kg)  BMI 37.49 kg/m2  SpO2 92% Physical Exam  Constitutional: She is oriented to person, place, and time. She appears well-nourished. No distress.  Ill-appearing female lying in bed  HENT:  Head: Normocephalic and atraumatic.  Mouth/Throat: No oropharyngeal exudate.  Eyes: Conjunctivae and EOM are normal. Pupils are equal, round, and reactive to light. Right eye exhibits no discharge. Left eye exhibits no discharge. No scleral icterus.  Neck: Normal range of motion.  Neck supple. No JVD present. No tracheal deviation present. No thyromegaly present.  Cardiovascular: Normal rate, regular rhythm, normal heart sounds and intact distal pulses.  Exam reveals no gallop and no friction rub.   No murmur heard. Pt became very dizzy and orthostatic when sat up from a lying position.  Pulmonary/Chest: Effort normal and breath sounds normal. No stridor. No respiratory distress. She has no wheezes. She has no rales. She exhibits no tenderness.  Abdominal: Soft. Bowel sounds are normal. She exhibits no distension and no mass. There is no tenderness. There is no rebound and no guarding.  Musculoskeletal: Normal range of motion. She exhibits no edema.  Lymphadenopathy:    She has no cervical adenopathy.  Neurological: She is alert and oriented to person, place, and time.  Pt with residual R sided hemiparesis and R sided facial droop from previous stroke.   Skin: Skin is warm and dry. No rash noted. She is not diaphoretic. No erythema. No pallor.  Psychiatric: She has a normal mood and affect. Her behavior is normal.  Nursing note and vitals reviewed.   ED Course  Procedures (including critical care time) Labs Review Labs Reviewed  BASIC METABOLIC PANEL - Abnormal; Notable for the following:    Glucose, Bld 170 (*)    GFR calc non Af Amer 54 (*)    All  other components within normal limits  CBC WITH DIFFERENTIAL/PLATELET - Abnormal; Notable for the following:    WBC 23.9 (*)    Neutro Abs 13.2 (*)    Lymphs Abs 9.8 (*)    All other components within normal limits  URINALYSIS, ROUTINE W REFLEX MICROSCOPIC (NOT AT [facility]) - Abnormal; Notable for the following:    APPearance CLOUDY (*)    Leukocytes, UA MODERATE (*)    All other components within normal limits  URINE MICROSCOPIC-ADD ON - Abnormal; Notable for the following:    Squamous Epithelial / LPF FEW (*)    Bacteria, UA MANY (*)    Casts HYALINE CASTS (*)    All other components within normal limits  GLUCOSE, CAPILLARY - Abnormal; Notable for the following:    Glucose-Capillary 153 (*)    All other components within normal limits  GLUCOSE, CAPILLARY - Abnormal; Notable for the following:    Glucose-Capillary 148 (*)    All other components within normal limits  GLUCOSE, CAPILLARY - Abnormal; Notable for the following:    Glucose-Capillary 166 (*)    All other components within normal limits  GLUCOSE, CAPILLARY - Abnormal; Notable for the following:    Glucose-Capillary 173 (*)    All other components within normal limits  GLUCOSE, CAPILLARY - Abnormal; Notable for the following:    Glucose-Capillary 156 (*)    All other components within normal limits  GLUCOSE, CAPILLARY - Abnormal; Notable for the following:    Glucose-Capillary 144 (*)    All other components within normal limits  GLUCOSE, CAPILLARY - Abnormal; Notable for the following:    Glucose-Capillary 170 (*)    All other components within normal limits  GLUCOSE, CAPILLARY - Abnormal; Notable for the following:    Glucose-Capillary 178 (*)    All other components within normal limits  CBC WITH DIFFERENTIAL/PLATELET - Abnormal; Notable for the following:    WBC 22.6 (*)    Neutro Abs 11.6 (*)    Lymphs Abs 9.7 (*)    Monocytes Absolute 1.1 (*)    All other components within normal limits  BASIC METABOLIC PANEL  -  Abnormal; Notable for the following:    Chloride 100 (*)    CO2 21 (*)    Glucose, Bld 181 (*)    GFR calc non Af Amer 49 (*)    GFR calc Af Amer 56 (*)    All other components within normal limits  GLUCOSE, CAPILLARY - Abnormal; Notable for the following:    Glucose-Capillary 172 (*)    All other components within normal limits  GLUCOSE, CAPILLARY - Abnormal; Notable for the following:    Glucose-Capillary 207 (*)    All other components within normal limits  URINE CULTURE  MRSA PCR SCREENING  TROPONIN I  TROPONIN I  TROPONIN I  TSH  I-STAT TROPOININ, ED    Imaging Review No results found. I have personally reviewed and evaluated these images and lab results as part of my medical decision-making.   EKG Interpretation   Date/Time:  Monday August 29 2015 10:43:12 EDT Ventricular Rate:  48 PR Interval:  189 QRS Duration: 157 QT Interval:  494 QTC Calculation: 441 R Axis:   -16 Text Interpretation:  Sinus bradycardia Right bundle branch block Probable  left ventricular hypertrophy No significant change since last tracing  Confirmed by [facility] MD, [place] ([id number]) on 08/29/2015 10:48:26 AM      MDM   Final diagnoses:  Orthostatic hypotension   Pt sent from neurology office where she experienced a syncopal episode. Pt found to be profoundly orthostatic in ED. This has been an issue for the pt since last discharge to SNF after suffering a large stroke.   Spoke with Dr. Erlinda Hong on the phone who witnessed the syncopal episode in his office today. Dr. Erlinda Hong states that he believes the pt is on too many BP medications which is causing her to be orthostatic. Pt was also c/o CP in his office after the syncopal episode.   EKG wnl. Trop wnl. Pt currently CP free. Low suspicion ACS. Pt states she will often get similar CP after taking her BP meds, like today. No new neurological symptoms. Do not suspect neurogenic cause of syncope. No post-ictal. Orthostasis is likely etiology.  Spoke  with hospitalist who admit pt to their service for fluid administration, observation and medication adjustment.   In the ER her white count is 23.9 (this is baseline), thought to be from pts breast cancer. Urine is positive for UA. IV rocephin given.  Systolic blood pressure has ranged from 89-187. Pt given IV fluids.  Pt in NAD. Patient was discussed with and seen by Dr. Billy Fischer who agrees with the treatment plan.    Dondra Spry [place], PA-C 08/31/15 1648  Gareth Morgan, MD 09/02/15 1610

## 2015-08-31 NOTE — Progress Notes (Signed)
Speech Language Pathology Treatment: Dysphagia  Patient Details Name: REJINA ODLE MRN: [id number] DOB: [birth year] Today's Date: 08/31/2015 Time: 0211-1735 SLP Time Calculation (min) (ACUTE ONLY): 12 min  Assessment / Plan / Recommendation Clinical Impression  F/u after yesterday's swallow evaluation.  Pt tolerating dysphagia 3, thin liquids with min verbal cues to attend to pocketing/residue left cheek.  Appears to be protecting airway adequately with consumption of thin liquids and straw.  Lungs are diminished but clear.  Will follow briefly during this stay to ensure safety/toleration.     HPI Other Pertinent Information: 81 y.o. female, with right-sided hemiplegia from previous stroke, recent diverticular bleed, recurrent UTIs, diabetes mellitus, and bilateral intracranial carotid stenosis and CVA 07/2015, re-admitted October 2016 with progression of stroke and while in hospital had a second stroke. CXR No active cardiopulmonary disease. BSE 08/10/15 recommended regular and thin and downgraded to Dys 3 9/30. EGD 07/08/14 there was a ring/stricture right at the GE junction that was easily passed and was widely patent.   Pertinent Vitals Pain Assessment: No/denies pain  SLP Plan  Continue with current plan of care    Recommendations Diet recommendations: Dysphagia 3 (mechanical soft);Thin liquid Liquids provided via: Cup;Straw Medication Administration: Whole meds with puree Supervision: Full supervision/cueing for compensatory strategies Compensations: Slow rate;Small sips/bites;Check for pocketing Postural Changes and/or Swallow Maneuvers: Seated upright 90 degrees              Oral Care Recommendations: Oral care BID Plan: Continue with current plan of care    GO Swallow Current Status (A7014): At least 20 percent but less than 40 percent impaired, limited or restricted Swallow Goal Status [phone]): At least 1 percent but less than 20 percent impaired, limited or restricted    Juan Quam [place] 08/31/2015, 11:41 AM

## 2015-08-31 NOTE — Progress Notes (Signed)
Patient: Molly [name] / Admit Date: 08/29/2015 / Date of Encounter: 08/31/2015, 9:02 AM   Subjective:  No CP. No recurrent syncope. She says she gets dizzy when she gets up. She says she does not want to go back to [facility].   Objective: Telemetry: NSR/SB Physical Exam: Blood pressure 165/52, pulse 66, temperature 98.3 F (36.8 C), temperature source Oral, resp. rate 21, height 5\' 4"  (1.626 m), weight 218 lb 8.6 oz (99.129 kg), SpO2 95 %. General: Well developed elderly WF in no acute distress. Head: Normocephalic, atraumatic Neck: JVP not elevated. Bilateral carotid bruits Lungs: Decreased breath sounds, no wheezing or rales. Heart: RRR 2/6 SEM at AOV. No rubs or gallops.  Abdomen: Soft, non-tender, non-distended with normoactive bowel sounds. No rebound/guarding. Extremities: No clubbing or cyanosis. No edema. Distal pedal pulses are 2+ and equal bilaterally. Neuro: Alert and oriented X 3. Dense hemiparesis right side with right facial droop and right sided weakness. Psych:  Responds to questions appropriately with a normal affect.   Intake/Output Summary (Last 24 hours) at 08/31/15 0902 Last data filed at 08/30/15 1700  Gross per 24 hour  Intake    600 ml  Output      0 ml  Net    600 ml    Inpatient Medications:  . amLODipine  10 mg Oral Daily  . anastrozole  1 mg Oral Daily  . atenolol  25 mg Oral TID  . clopidogrel  75 mg Oral Daily  . enoxaparin (LOVENOX) injection  40 mg Subcutaneous Q24H  . ezetimibe  10 mg Oral q1800  . insulin aspart  0-9 Units Subcutaneous TID WC  . insulin glargine  10 Units Subcutaneous Q1200  . irbesartan  300 mg Oral Daily  . levothyroxine  75 mcg Oral QAC breakfast  . polyethylene glycol  17 g Oral Daily  . sodium chloride  3 mL Intravenous Q12H  . sodium chloride  3 mL Intravenous Q12H  . sulfamethoxazole-trimethoprim  1 tablet Oral Q12H  . vitamin B-12  500 mcg Oral Q lunch   Infusions:    Labs:  Recent Labs   08/29/15 1132 08/31/15 0804  NA 136 135  K 4.1 3.9  CL 101 100*  CO2 23 21*  GLUCOSE 170* 181*  BUN 17 11  CREATININE 0.91 0.99  CALCIUM 9.9 9.5   No results for input(s): AST, ALT, ALKPHOS, BILITOT, PROT, ALBUMIN in the last 72 hours.  Recent Labs  08/29/15 1132 08/31/15 0804  WBC 23.9* 22.6*  NEUTROABS 13.2* PENDING  HGB 14.4 13.2  HCT 43.1 39.6  MCV 92.5 91.5  PLT PLATELET CLUMPS NOTED ON SMEAR, COUNT APPEARS ADEQUATE 325    Recent Labs  08/29/15 1703 08/29/15 2239 08/30/15 0448  TROPONINI <0.03 <0.03 0.03    Radiology/Studies:  Dg Chest 2 View  08/29/2015  CLINICAL DATA:  Syncopal episode. EXAM: CHEST  2 VIEW COMPARISON:  08/20/2015 FINDINGS: The heart size and mediastinal contours are within normal limits. Both lungs are clear. No evidence of pneumothorax or pleural effusion . The visualized skeletal structures are unremarkable. IMPRESSION: No active cardiopulmonary disease. Electronically Signed   By: Earle Gell M.D.   On: 08/29/2015 12:55   Echo 07/30/15 Study Conclusions  - Left ventricle: The cavity size was normal. There was moderate concentric hypertrophy. Systolic function was vigorous. The estimated ejection fraction was in the range of 65% to 70%. Wall motion was normal; there were no regional wall motion abnormalities. Doppler parameters are consistent with abnormal  left ventricular relaxation (grade 1 diastolic dysfunction). Doppler parameters are consistent with high ventricular filling pressure. - Aortic valve: Trileaflet; moderately thickened, moderately calcified leaflets especially non coronary cusp. Valve mobility was mildly restricted. There was very mild stenosis. Peak velocity (S): 244 cm/s. Mean gradient (S): 13 mm Hg. Valve area (VTI): 1.42 cm^2. Valve area (Vmax): 1.35 cm^2. Valve area (Vmean): 1.17 cm^2. - Mitral valve: Calcified annulus.  Impressions:  - No cardiac source of emboli was indentified. When  compared to prior, mild aortic stenosis is now present.      Assessment and Plan  81 y/o F with labile HTN, HLD, right-sided hemiplegia s/p CVA, recent diverticular bleed, DM, severe bilateral intracranial carotid stenosis, breast cancer with metastasis to bone, CLL with chronic leukocytosis, and mild aortic stenosis presented with chest pain and syncope. Stroke 07/2015 - received TPA and placed on Plavix; admit c/b diverticular bleed, strokes felt 2/2 hypoperfusion in setting of low BP with severe intracranial vascular disease. Most recent echo 07/2015: EF 65-70%, G1DD, no RWMA, mild AS.  1. Chest pain - ruled out for MI. May be due to labile BPs. EKG with sinus brady and chronic RBBB. Suspect continued supportive care in setting of multiple comorbidities is most appropriate.  2. H/o syncope/pre-syncope -  related to hypotension in neurology office. BB reduced due to sinus bradycardia. She is not a candidate for PPM because of multiple comorbidities.  3. Labile HTN with intermittent hypotension - this is not a new issue. She was taking amlodipine 10mg , atenolol 50mg  TID, clonidine 0.1mg  BID, irbesartan 300mg  qd, and spironolactone 12.5 mg qd prior to admission. Per Dr. Phoebe Sharps most recent note, recent CVAs most likely due to hypoperfusion 2/2 hypotension. She may need permissive HTN to maintain cerebral perfusion. IM has held spironolactone and clonidine for permissive BP up to 970 systolic which is definitely reasonable in a 81 year old woman who is approaching comfort care. Atenolol is also being reduced. If BP rises further given decrease in atenolol, would consider readdition of spironolactone.  4. Sinus bradycardia - atenolol decreased as above.  5. Aortic stenosis - mild by recent echocardiogram.  Kerin Ransom PA-C 08/31/2015 9:10 AM  Agree with above assessment and plan.  Currently the patient is running acceptable blood pressure levels for her.  Allowing permissive hypertension.   Telemetry shows heart rates in the 60s during sleep and in the 70s while awake which is acceptable.  Would continue current medication.  Anticipate transfer back to skilled nursing facility soon

## 2015-08-31 NOTE — Progress Notes (Signed)
[facility]                                                                              Patient Demographics  Molly [name], is a 81 y.o. female, DOB - [birth year], YIF:[id number]  Admit date - 08/29/2015   Admitting Physician Norval Morton, MD  Outpatient Primary MD for the patient is FULP, CAMMIE, MD  LOS -    Chief Complaint  Patient presents with  . Near Syncope      HPI on 08/29/2015 by Molly [name], [place] Molly [name] is a 81 y.o. 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 Phoebe Sharps neurology office with chest pain after a syncopal episode. In brief, Molly [name] had a stroke in September 2016 and received TPA. She was readmitted in October with progression of the stroke and while in the hospital had a second stroke. She was found to have bilateral severe carotid stenosis. During the same hospitalization she had a diverticular bleed. She developed very labile blood pressures. Since discharge she has had 2 syncopal episodes at the SNF. She had a third syncopal episode today in the neurology out patient office. After her syncopal episode she developed chest pain. The patient says she has similar chest pain whenever she takes several pills at the same time. Today's chest pain is different because it has not resolved over the last several hours. It is located just left of her sternum and does not radiate it is dull in nature.  In the ER her white count is 23.9 (this is baseline). Urine is positive for UA. Systolic blood pressure has ranged from 89-187. Pulse rate has ranged from 40-65.  Assessment & Plan   Syncope and collapse -Likely secondary to labile blood pressures versus ICA stenosis -PT consulted  Labile hypertension with intermittent hypotension -It seems to be an ongoing issue -Cardiology consultation  appreciated, allow for permissive hypertension -Atenolol dose reduced -Continue amlodipine, irbesartan  Sinus bradycardia -Per cardiology, patient is not pacemaker candidate due to multiple comorbidities -Atenolol reduced  Breast cancer with leukocytosis -Follow up with oncology at discharge  -Continue Arimidex -Patient has chronic leukocytosis  Diabetes mellitus, type 2  -Continue Lantus, insulin sliding scale CBG monitoring  H/o CVA/intracranial carotid stenosis -Continue Plavix  Hyperlipidemia -Continue zetia  Current UTI -Continue Bactrim -Urine culture  Pressure ulcer, stage 3 -Sacrum -Wound care consulted  Code Status: DNR  Family Communication: None at bedside  Disposition Plan: Admitted.   Time Spent in minutes   30 minutes  Procedures  None  Consults   Cardiology  DVT Prophylaxis  Lovenox  Lab Results  Component Value Date   PLT 325 08/31/2015    Medications  Scheduled Meds: . amLODipine  10 mg Oral Daily  . anastrozole  1 mg Oral Daily  . atenolol  25 mg Oral TID  . clopidogrel  75 mg Oral Daily  . enoxaparin (LOVENOX) injection  40 mg Subcutaneous Q24H  . ezetimibe  10 mg Oral q1800  . insulin aspart  0-9 Units Subcutaneous TID WC  . insulin glargine  10 Units Subcutaneous  Q1200  . irbesartan  300 mg Oral Daily  . levothyroxine  75 mcg Oral QAC breakfast  . polyethylene glycol  17 g Oral Daily  . sodium chloride  3 mL Intravenous Q12H  . sodium chloride  3 mL Intravenous Q12H  . sulfamethoxazole-trimethoprim  1 tablet Oral Q12H  . vitamin B-12  500 mcg Oral Q lunch   Continuous Infusions:  PRN Meds:.sodium chloride, acetaminophen **OR** acetaminophen, alum & mag hydroxide-simeth, hydrALAZINE, ondansetron **OR** ondansetron (ZOFRAN) IV, polyethylene glycol, sodium chloride  Antibiotics    Anti-infectives    Start     Dose/Rate Route Frequency Ordered Stop   08/29/15 1800  sulfamethoxazole-trimethoprim (BACTRIM DS,SEPTRA DS)  800-160 MG per tablet 1 tablet     1 tablet Oral Every 12 hours 08/29/15 1648 09/01/15 1759   08/29/15 1245  cefTRIAXone (ROCEPHIN) 1 g in dextrose 5 % 50 mL IVPB     1 g 100 mL/hr over 30 Minutes Intravenous  Once 08/29/15 1233 08/29/15 1328      Subjective:   Molly [name] seen and examined today.  Patient has no complaints today.  States she has dizziness when she gets up.  Does not want to go back to [facility] nursing facility.  Denies chest pain or shortness of breath.   Objective:   Filed Vitals:   08/30/15 0922 08/30/15 1135 08/30/15 2056 08/31/15 0432  BP: 182/57 184/52 167/53 165/52  Pulse: 60  63 66  Temp:  97.5 F (36.4 C) 97.5 F (36.4 C) 98.3 F (36.8 C)  TempSrc:  Oral Oral Oral  Resp:  21 21 21   Height:      Weight:    99.129 kg (218 lb 8.6 oz)  SpO2: 96% 100% 97% 95%    Wt Readings from Last 3 Encounters:  08/31/15 99.129 kg (218 lb 8.6 oz)  08/29/15 81.194 kg (179 lb)  08/11/15 84.5 kg (186 lb 4.6 oz)     Intake/Output Summary (Last 24 hours) at 08/31/15 1036 Last data filed at 08/31/15 0950  Gross per 24 hour  Intake    600 ml  Output      0 ml  Net    600 ml    Exam  General: Well developed, well nourished, NAD, appears stated age  HEENT: NCAT, mucous membranes moist.   Cardiovascular: S1 S2 auscultated, 2/6 SEM, RRR  Respiratory: Diminished breath sounds, but clear  Abdomen: Soft, nontender, nondistended, + bowel sounds  Extremities: warm dry without cyanosis clubbing or edema  Neuro: AAOx3, Right sided hemiparesis/weakness and facial droop (not new)  Psych: Normal affect and demeanor  Data Review   Micro Results Recent Results (from the past 240 hour(s))  Urine culture     Status: None   Collection Time: 08/29/15 12:00 PM  Result Value Ref Range Status   Specimen Description URINE, CLEAN CATCH  Final   Special Requests NONE  Final   Culture MULTIPLE SPECIES PRESENT, SUGGEST RECOLLECTION  Final   Report Status 08/31/2015  FINAL  Final  MRSA PCR Screening     Status: None   Collection Time: 08/29/15  6:44 PM  Result Value Ref Range Status   MRSA by PCR NEGATIVE NEGATIVE Final    Comment:        The GeneXpert MRSA Assay (FDA approved for NASAL specimens only), is one component of a comprehensive MRSA colonization surveillance program. It is not intended to diagnose MRSA infection nor to guide or monitor treatment for MRSA infections.  Radiology Reports Dg Chest 2 View  08/29/2015  CLINICAL DATA:  Syncopal episode. EXAM: CHEST  2 VIEW COMPARISON:  08/20/2015 FINDINGS: The heart size and mediastinal contours are within normal limits. Both lungs are clear. No evidence of pneumothorax or pleural effusion . The visualized skeletal structures are unremarkable. IMPRESSION: No active cardiopulmonary disease. Electronically Signed   By: Earle Gell M.D.   On: 08/29/2015 12:55   Ct Head Wo Contrast  08/20/2015  CLINICAL DATA:  Acute onset of generalized weakness, blurred vision and confusion. Initial encounter. EXAM: CT HEAD WITHOUT CONTRAST TECHNIQUE: Contiguous axial images were obtained from the base of the skull through the vertex without intravenous contrast. COMPARISON:  CT of the head performed 08/14/2015 FINDINGS: There is no evidence of acute infarction, mass lesion, or intra- or extra-axial hemorrhage on CT. The recurrent evolving infarct in the left centrum semiovale is again noted. Prominence of the ventricles and sulci reflects mild cortical volume loss. Scattered periventricular and subcortical white matter change likely reflects small vessel ischemic microangiopathy. Cerebellar atrophy is noted. Small chronic lacunar infarcts are noted at the basal ganglia bilaterally. The brainstem and fourth ventricle are within normal limits. The basal ganglia are unremarkable in appearance. The cerebral hemispheres demonstrate grossly normal gray-white differentiation. No mass effect or midline shift is seen. There  is no evidence of fracture; visualized osseous structures are unremarkable in appearance. The orbits are within normal limits. The paranasal sinuses and mastoid air cells are well-aerated. No significant soft tissue abnormalities are seen. IMPRESSION: 1. No acute intracranial pathology seen on CT. 2. Evolving infarct at the left centrum semiovale again noted. 3. Mild cortical volume loss and scattered small vessel ischemic microangiopathy. 4. Small chronic lacunar infarcts at the basal ganglia bilaterally. Electronically Signed   By: Garald Balding M.D.   On: 08/20/2015 17:54   Ct Head Wo Contrast  08/14/2015  CLINICAL DATA:  Follow up stroke.  RIGHT-sided weakness. EXAM: CT HEAD WITHOUT CONTRAST TECHNIQUE: Contiguous axial images were obtained from the base of the skull through the vertex without intravenous contrast. COMPARISON:  Most recent MR 08/12/2015. Most recent CT head 08/12/2015. FINDINGS: Increasingly well defined hypoattenuation in the LEFT centrum semiovale representing the area of restricted diffusion on the recent MR. No definite new areas of acute infarction. No hemorrhagic transformation. Baseline atrophy with small vessel disease. Calvarium intact. No sinus or acute mastoid disease. Vascular calcification. IMPRESSION: Increasingly well-defined hypoattenuation representing acute infarction LEFT MCA territory. No hemorrhagic transformation. Electronically Signed   By: Staci Righter M.D.   On: 08/14/2015 17:14   Ct Head Wo Contrast  08/12/2015  CLINICAL DATA:  Recent stroke, post tPA with resolution of symptoms, readmitted for new RIGHT-sided weakness, slurred speech and facial droop, hypertension, type II diabetes mellitus, RIGHT breast cancer, hypercholesterolemia EXAM: CT HEAD WITHOUT CONTRAST TECHNIQUE: Contiguous axial images were obtained from the base of the skull through the vertex without intravenous contrast. COMPARISON:  08/10/2015 FINDINGS: Generalized atrophy. Normal ventricular  morphology. No midline shift or mass effect. Extensive small vessel chronic ischemic changes of deep cerebral white matter. No intracranial hemorrhage, mass lesion or evidence acute infarction. Again identified subacute nonhemorrhagic infarct anterior LEFT basal ganglia. No extra-axial fluid collections. Extensive atherosclerotic calcifications of internal carotid and vertebral arteries at skullbase. Bones and sinuses unremarkable. IMPRESSION: Atrophy with small vessel chronic ischemic changes of deep cerebral white matter. Subacute nonhemorrhagic infarct at LEFT basal ganglia unchanged. Extensive atherosclerotic calcification. No new intracranial abnormalities. Electronically Signed   By: Elta Guadeloupe  Thornton Papas M.D.   On: 08/12/2015 08:26   Ct Head Wo Contrast  08/10/2015  CLINICAL DATA:  Code stroke. Right-sided weakness with right facial droop. EXAM: CT HEAD WITHOUT CONTRAST TECHNIQUE: Contiguous axial images were obtained from the base of the skull through the vertex without intravenous contrast. COMPARISON:  Head CT 07/29/2015, brain MRI 07/30/2015 FINDINGS: No intracranial hemorrhage. Expected evolution with minimal developing hypodensity in the left basal ganglia at site of acute infarct on MRI, no associated edema. No evidence territorial infarct or progressive acute ischemia. Background atrophy and chronic small vessel ischemia is stable in the interim. No cerebral edema, extra-axial fluid collection, mass effect or midline shift. Atherosclerosis again seen of the skullbase vasculature. Tiny mucous retention cyst right maxillary sinus. Calvarium is intact. IMPRESSION: 1. Expected evolution of the left basal ganglia infarct since prior exam. No interval hemorrhage, mass effect or midline shift. 2. Background chronic small vessel ischemia without CT findings of acute territorial infarct. These results were called by telephone at the time of interpretation on 08/10/2015 at 12:11 am to Dr. Janann Colonel , who verbally  acknowledged these results. Electronically Signed   By: Jeb Levering M.D.   On: 08/10/2015 00:12   Mr Virgel Paling Wo Contrast  08/10/2015  CLINICAL DATA:  81 year old female with recent lacunar infarct in the left hemisphere earlier this month status post tPA. Recurrent right side weakness and facial droop. Initial encounter. EXAM: MRI HEAD WITHOUT CONTRAST MRA HEAD WITHOUT CONTRAST TECHNIQUE: Multiplanar, multiecho pulse sequences of the brain and surrounding structures were obtained without intravenous contrast. Angiographic images of the head were obtained using MRA technique without contrast. COMPARISON:  Head CT at 0007 hours today. Brain MRI and head and neck MRA 07/30/2015 FINDINGS: MRI HEAD FINDINGS Linear restricted diffusion extending from the posterior left corona radiata inferiorly toward the putamen and external capsule has faded since 07/30/2015. There is stable curvilinear restricted diffusion in the more anterior corona radiata (series 6, image 18). No new areas of diffusion restriction. Major intracranial vascular flow voids are stable. No interval hemorrhage. No intracranial mass effect. No new intracranial signal abnormality. No mass effect, evidence of mass lesion, ventriculomegaly, or extra-axial collection. Cervicomedullary junction and pituitary are within normal limits. Stable paranasal sinuses, mastoids, visualized internal auditory structures, orbits soft tissues, and scalp soft tissues. Negative visualized cervical spine. Bone marrow signal remains normal. MRA HEAD FINDINGS Stable posterior circulation, high-grade stenosis of the left vertebral artery V4 segment and asymmetrically decreased flow signal at the left PICA origin. Up to moderate proximal basilar artery stenosis. The basilar artery remains patent. SCA and PCAs are stable, mild to moderate left P1 segment stenosis. Posterior communicating arteries are diminutive or absent. Stable antegrade flow signal in the distal cervical  ICAs. Stable ICA siphons with high-grade bilateral supraclinoid segment irregularity. Both ophthalmic artery origins remain patent. Stable carotid termini. MCA and ACA origins remain patent. Stable visualized bilateral ACA branches. Stable visualized right MCA branches with mild mid M1 stenosis. Left MCA M1 segment and bifurcation irregularity and up to mild stenosis appears stable. Visualized left MCA branches are stable. IMPRESSION: 1. Expected evolution of the recent left MCA white matter infarct. No interval hemorrhage. No associated mass effect. 2. No new intracranial abnormality. 3. Stable severe intracranial atherosclerosis on MRA. Moderate to high-grade stenoses of the distal left vertebral artery, both distal ICA siphons, and the proximal basilar artery. Electronically Signed   By: Genevie Ann M.D.   On: 08/10/2015 14:21   Mr Brain Wo Contrast  08/12/2015  CLINICAL DATA:  Right upper and right lower extremity weakness, asymmetric smile, rightward tongue deviation. Recent left MCA infarct. EXAM: MRI HEAD WITHOUT CONTRAST TECHNIQUE: Multiplanar, multiecho pulse sequences of the brain and surrounding structures were obtained without intravenous contrast. COMPARISON:  08/10/2015 FINDINGS: There is a 1.5 cm focus of restricted diffusion consistent with acute infarct in the left lateral lenticulostriate artery territory extending from the posterior body of the left caudate to the posterior left lentiform nucleus, predominantly involving the posterior corona radiata. This restricted diffusion is new from the MRI of 2 days ago. It is in the same locate as the infarct on the 07/30/2015 brain MRI, although it is more confluent and mildly larger in size. Residual abnormal diffusion signal more anteriorly in the left corona radiata on the prior MRI has further decreased, without residual restricted diffusion in this location. Mild T1 hyperintensity and susceptibility artifact in the left putamen, predominantly  posteriorly, may reflect subacute petechial blood products related to the recent ischemia. There is no parenchymal hematoma. A focus of chronic hemorrhage is again seen in the white matter of the right frontal lobe. There is no mass, midline shift, or extra-axial fluid collection. Moderate generalized cerebral atrophy and moderate chronic small vessel ischemic change in the cerebral white matter are stable. Small, chronic infarcts are again seen in the bilateral basal ganglia, thalami, deep cerebral white matter, and pons. Prior bilateral cataract extraction is noted. Tiny right maxillary sinus mucous retention cysts are again noted. Mastoid air cells are clear. Major intracranial vascular flow voids are preserved. IMPRESSION: 1. 1.5 cm recurrent acute infarct centered in the posterior left corona radiata, new from the MRI of 2 days ago. This is in the same location although is mildly larger than that on the 07/30/2015 MRI. 2. Chronic ischemic changes as above. Electronically Signed   By: Logan Bores M.D.   On: 08/12/2015 09:21   Mr Brain Wo Contrast  08/10/2015  CLINICAL DATA:  82 year old female with recent lacunar infarct in the left hemisphere earlier this month status post tPA. Recurrent right side weakness and facial droop. Initial encounter. EXAM: MRI HEAD WITHOUT CONTRAST MRA HEAD WITHOUT CONTRAST TECHNIQUE: Multiplanar, multiecho pulse sequences of the brain and surrounding structures were obtained without intravenous contrast. Angiographic images of the head were obtained using MRA technique without contrast. COMPARISON:  Head CT at 0007 hours today. Brain MRI and head and neck MRA 07/30/2015 FINDINGS: MRI HEAD FINDINGS Linear restricted diffusion extending from the posterior left corona radiata inferiorly toward the putamen and external capsule has faded since 07/30/2015. There is stable curvilinear restricted diffusion in the more anterior corona radiata (series 6, image 18). No new areas of  diffusion restriction. Major intracranial vascular flow voids are stable. No interval hemorrhage. No intracranial mass effect. No new intracranial signal abnormality. No mass effect, evidence of mass lesion, ventriculomegaly, or extra-axial collection. Cervicomedullary junction and pituitary are within normal limits. Stable paranasal sinuses, mastoids, visualized internal auditory structures, orbits soft tissues, and scalp soft tissues. Negative visualized cervical spine. Bone marrow signal remains normal. MRA HEAD FINDINGS Stable posterior circulation, high-grade stenosis of the left vertebral artery V4 segment and asymmetrically decreased flow signal at the left PICA origin. Up to moderate proximal basilar artery stenosis. The basilar artery remains patent. SCA and PCAs are stable, mild to moderate left P1 segment stenosis. Posterior communicating arteries are diminutive or absent. Stable antegrade flow signal in the distal cervical ICAs. Stable ICA siphons with high-grade bilateral supraclinoid segment irregularity.  Both ophthalmic artery origins remain patent. Stable carotid termini. MCA and ACA origins remain patent. Stable visualized bilateral ACA branches. Stable visualized right MCA branches with mild mid M1 stenosis. Left MCA M1 segment and bifurcation irregularity and up to mild stenosis appears stable. Visualized left MCA branches are stable. IMPRESSION: 1. Expected evolution of the recent left MCA white matter infarct. No interval hemorrhage. No associated mass effect. 2. No new intracranial abnormality. 3. Stable severe intracranial atherosclerosis on MRA. Moderate to high-grade stenoses of the distal left vertebral artery, both distal ICA siphons, and the proximal basilar artery. Electronically Signed   By: Genevie Ann M.D.   On: 08/10/2015 14:21   Dg Chest Port 1 View  08/20/2015  CLINICAL DATA:  81 year old with acute onset of blurred vision, generalized weakness and confusion earlier today while at  the nursing home. EXAM: PORTABLE CHEST 1 VIEW COMPARISON:  08/10/2015 and earlier. FINDINGS: Cardiac silhouette moderately enlarged, unchanged. Linear atelectasis involving the left lung base. Lungs otherwise clear. No localized airspace consolidation. No pleural effusions. No pneumothorax. Normal pulmonary vascularity. IMPRESSION: Stable cardiomegaly. Linear atelectasis involving the left lung base. No acute cardiopulmonary disease otherwise. Electronically Signed   By: Evangeline Dakin M.D.   On: 08/20/2015 17:39   Dg Chest Portable 1 View  08/10/2015  CLINICAL DATA:  Weakness. EXAM: PORTABLE CHEST 1 VIEW COMPARISON:  07/29/2015 FINDINGS: Lung volumes are low. The cardiomediastinal contours are unchanged. Pulmonary vasculature is normal. No consolidation, pleural effusion, or pneumothorax. No acute osseous abnormalities are seen. Remote left rib fracture is unchanged. IMPRESSION: No acute pulmonary process. Electronically Signed   By: Jeb Levering M.D.   On: 08/10/2015 02:27    CBC  Recent Labs Lab 08/29/15 1132 08/31/15 0804  WBC 23.9* 22.6*  HGB 14.4 13.2  HCT 43.1 39.6  PLT PLATELET CLUMPS NOTED ON SMEAR, COUNT APPEARS ADEQUATE 325  MCV 92.5 91.5  MCH 30.9 30.5  MCHC 33.4 33.3  RDW 14.7 14.4  LYMPHSABS 9.8* 9.7*  MONOABS 0.7 1.1*  EOSABS 0.2 0.2  BASOSABS 0.0 0.0    Chemistries   Recent Labs Lab 08/29/15 1132 08/31/15 0804  NA 136 135  K 4.1 3.9  CL 101 100*  CO2 23 21*  GLUCOSE 170* 181*  BUN 17 11  CREATININE 0.91 0.99  CALCIUM 9.9 9.5   ------------------------------------------------------------------------------------------------------------------ estimated creatinine clearance is 43.2 mL/min (by C-G formula based on Cr of 0.99). ------------------------------------------------------------------------------------------------------------------ No results for input(s): HGBA1C in the last 72  hours. ------------------------------------------------------------------------------------------------------------------ No results for input(s): CHOL, HDL, LDLCALC, TRIG, CHOLHDL, LDLDIRECT in the last 72 hours. ------------------------------------------------------------------------------------------------------------------  Recent Labs  08/30/15 0445  TSH 3.327   ------------------------------------------------------------------------------------------------------------------ No results for input(s): VITAMINB12, FOLATE, FERRITIN, TIBC, IRON, RETICCTPCT in the last 72 hours.  Coagulation profile No results for input(s): INR, PROTIME in the last 168 hours.  No results for input(s): DDIMER in the last 72 hours.  Cardiac Enzymes  Recent Labs Lab 08/29/15 1703 08/29/15 2239 08/30/15 0448  TROPONINI <0.03 <0.03 0.03   ------------------------------------------------------------------------------------------------------------------ Invalid input(s): POCBNP    Jaquelyne Firkus D.O. on 08/31/2015 at 10:36 AM  Between 7am to 7pm - Pager - [phone]  After 7pm go to [email or web address] - password [email or web address]  And look for the night coverage person covering for me after hours  [facility] Office  [phone]

## 2015-09-01 DIAGNOSIS — C50919 Malignant neoplasm of unspecified site of unspecified female breast: Secondary | ICD-10-CM | POA: Diagnosis not present

## 2015-09-01 DIAGNOSIS — I63032 Cerebral infarction due to thrombosis of left carotid artery: Secondary | ICD-10-CM | POA: Diagnosis not present

## 2015-09-01 DIAGNOSIS — C7951 Secondary malignant neoplasm of bone: Secondary | ICD-10-CM

## 2015-09-01 DIAGNOSIS — R011 Cardiac murmur, unspecified: Secondary | ICD-10-CM

## 2015-09-01 DIAGNOSIS — R55 Syncope and collapse: Secondary | ICD-10-CM | POA: Diagnosis not present

## 2015-09-01 DIAGNOSIS — I951 Orthostatic hypotension: Secondary | ICD-10-CM | POA: Diagnosis not present

## 2015-09-01 LAB — GLUCOSE, CAPILLARY
GLUCOSE-CAPILLARY: 199 mg/dL — AB (ref 65–99)
GLUCOSE-CAPILLARY: 151 mg/dL — AB (ref 65–99)
Glucose-Capillary: 200 mg/dL — ABNORMAL HIGH (ref 65–99)
Glucose-Capillary: 219 mg/dL — ABNORMAL HIGH (ref 65–99)

## 2015-09-01 NOTE — Clinical Social Work Placement (Signed)
   CLINICAL SOCIAL WORK PLACEMENT  NOTE  Date:  09/01/2015  Patient Details  Name: ALBIE BAZIN MRN: [id number] Date of Birth: 19-Mar-1923  Clinical Social Work is seeking post-discharge placement for this patient at the [place] level of care (*CSW will initial, date and re-position this form in  chart as items are completed):  Yes   Patient/family provided with [facility]'s list of facilities offering this level of care within the geographic area requested by the patient (or if unable, by the patient's family).  Yes   Patient/family informed of their freedom to choose among providers that offer the needed level of care, that participate in Medicare, Medicaid or managed care program needed by the patient, have an available bed and are willing to accept the patient.  Yes   Patient/family informed of [facility]'s ownership interest in [facility] and [facility], as well as of the fact that they are under no obligation to receive care at these facilities.  PASRR submitted to EDS on       PASRR number received on       Existing PASRR number confirmed on 08/30/15     FL2 transmitted to all facilities in geographic area requested by pt/family on 08/30/15     FL2 transmitted to all facilities within larger geographic area on       Patient informed that his/her managed care company has contracts with or will negotiate with certain facilities, including the following:   [facility]     Yes   Patient/family informed of bed offers received.  Patient chooses bed at [facility] and [place] recommends and patient chooses bed at      Patient to be transferred to [facility] on  .  Patient to be transferred to facility by [facility])     Patient family notified on   of transfer.  Name of family member notified:        PHYSICIAN Please sign FL2, Please sign DNR,  Please prepare priority discharge summary, including medications, Please prepare prescriptions     Additional Comment:    _______________________________________________ Williemae Area, LCSW 09/01/2015,4:00 PM

## 2015-09-01 NOTE — Progress Notes (Signed)
[facility]                                                                              Patient Demographics  Molly [name], is a 81 y.o. female, DOB - May 27, 1923, WVP:[id number]  Admit date - 08/29/2015   Admitting Physician Norval Morton, MD  Outpatient Primary MD for the patient is FULP, CAMMIE, MD  LOS -    Chief Complaint  Patient presents with  . Near Syncope      HPI on 08/29/2015 by Ms. Haring, [place] Molly [name] is a 81 y.o. 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 Phoebe Sharps neurology office with chest pain after a syncopal episode. In brief, Ms. Bettes had a stroke in September 2016 and received TPA. She was readmitted in October with progression of the stroke and while in the hospital had a second stroke. She was found to have bilateral severe carotid stenosis. During the same hospitalization she had a diverticular bleed. She developed very labile blood pressures. Since discharge she has had 2 syncopal episodes at the SNF. She had a third syncopal episode today in the neurology out patient office. After her syncopal episode she developed chest pain. The patient says she has similar chest pain whenever she takes several pills at the same time. Today's chest pain is different because it has not resolved over the last several hours. It is located just left of her sternum and does not radiate it is dull in nature.  In the ER her white count is 23.9 (this is baseline). Urine is positive for UA. Systolic blood pressure has ranged from 89-187. Pulse rate has ranged from 40-65.  Assessment & Plan   Syncope and collapse -Likely secondary to labile blood pressures versus ICA stenosis -PT consulted and pending -Have asked for repeated orthostatics and to have patient OOB  Labile hypertension with intermittent  hypotension -It seems to be an ongoing issue -Cardiology consultation appreciated, allow for permissive hypertension -Atenolol dose reduced -Continue amlodipine, irbesartan  Sinus bradycardia -Per cardiology, patient is not pacemaker candidate due to multiple comorbidities -Atenolol reduced  Breast cancer with leukocytosis -Follow up with oncology at discharge  -Continue Arimidex -Patient has chronic leukocytosis  Diabetes mellitus, type 2  -Continue Lantus, insulin sliding scale CBG monitoring  H/o CVA/intracranial carotid stenosis -Continue Plavix  Hyperlipidemia -Continue zetia  Current UTI -Continue Bactrim -Urine culture  Pressure ulcer, stage 3 -Sacrum -Wound care consulted  Code Status: DNR  Family Communication: None at bedside. Daughter via phone  Disposition Plan: Admitted. Pending PT and continue to monitor BP. Expect d/c to SNF 10/21.  Time Spent in minutes   30 minutes  Procedures  None  Consults   Cardiology  DVT Prophylaxis  Lovenox  Lab Results  Component Value Date   PLT 325 08/31/2015    Medications  Scheduled Meds: . amLODipine  10 mg Oral Daily  . anastrozole  1 mg Oral Daily  . atenolol  25 mg Oral TID  . clopidogrel  75 mg Oral Daily  . enoxaparin (LOVENOX) injection  40 mg Subcutaneous Q24H  .  ezetimibe  10 mg Oral q1800  . insulin aspart  0-9 Units Subcutaneous TID WC  . insulin glargine  10 Units Subcutaneous Q1200  . irbesartan  300 mg Oral Daily  . levothyroxine  75 mcg Oral QAC breakfast  . polyethylene glycol  17 g Oral Daily  . sodium chloride  3 mL Intravenous Q12H  . sodium chloride  3 mL Intravenous Q12H  . vitamin B-12  500 mcg Oral Q lunch   Continuous Infusions:  PRN Meds:.sodium chloride, acetaminophen **OR** acetaminophen, alum & mag hydroxide-simeth, ondansetron **OR** ondansetron (ZOFRAN) IV, polyethylene glycol, sodium chloride  Antibiotics    Anti-infectives    Start     Dose/Rate Route Frequency  Ordered Stop   08/29/15 1800  sulfamethoxazole-trimethoprim (BACTRIM DS,SEPTRA DS) 800-160 MG per tablet 1 tablet     1 tablet Oral Every 12 hours 08/29/15 1648 09/01/15 0536   08/29/15 1245  cefTRIAXone (ROCEPHIN) 1 g in dextrose 5 % 50 mL IVPB     1 g 100 mL/hr over 30 Minutes Intravenous  Once 08/29/15 1233 08/29/15 1328      Subjective:   [name] seen and examined today.  Patient has no complaints today.  Denies chest pain or shortness of breath, abdominal pain.    Objective:   Filed Vitals:   09/01/15 0400 09/01/15 0413 09/01/15 0534 09/01/15 0810  BP: 191/54 181/62 182/70 169/53  Pulse:    65  Temp:    97.7 F (36.5 C)  TempSrc:    Oral  Resp:      Height:      Weight: 100.835 kg (222 lb 4.8 oz)     SpO2:    99%    Wt Readings from Last 3 Encounters:  09/01/15 100.835 kg (222 lb 4.8 oz)  08/29/15 81.194 kg (179 lb)  08/11/15 84.5 kg (186 lb 4.6 oz)     Intake/Output Summary (Last 24 hours) at 09/01/15 1216 Last data filed at 08/31/15 2043  Gross per 24 hour  Intake    340 ml  Output      0 ml  Net    340 ml    Exam  General: Well developed, well nourished, NAD  HEENT: NCAT, mucous membranes moist.   Cardiovascular: S1 S2 auscultated, 2/6 SEM, RRR  Respiratory: Clear to ausculation   Abdomen: Soft, nontender, nondistended, + bowel sounds  Extremities: warm dry without cyanosis clubbing or edema  Neuro: AAOx3, Right sided hemiparesis/weakness and facial droop (not new)  Psych: Normal affect and demeanor, pleasant   Data Review   Micro Results Recent Results (from the past 240 hour(s))  Urine culture     Status: None   Collection Time: 08/29/15 12:00 PM  Result Value Ref Range Status   Specimen Description URINE, CLEAN CATCH  Final   Special Requests NONE  Final   Culture MULTIPLE SPECIES PRESENT, SUGGEST RECOLLECTION  Final   Report Status 08/31/2015 FINAL  Final  MRSA PCR Screening     Status: None   Collection Time: 08/29/15  6:44  PM  Result Value Ref Range Status   MRSA by PCR NEGATIVE NEGATIVE Final    Comment:        The GeneXpert MRSA Assay (FDA approved for NASAL specimens only), is one component of a comprehensive MRSA colonization surveillance program. It is not intended to diagnose MRSA infection nor to guide or monitor treatment for MRSA infections.     Radiology Reports Dg Chest 2 View  08/29/2015  CLINICAL  DATA:  Syncopal episode. EXAM: CHEST  2 VIEW COMPARISON:  08/20/2015 FINDINGS: The heart size and mediastinal contours are within normal limits. Both lungs are clear. No evidence of pneumothorax or pleural effusion . The visualized skeletal structures are unremarkable. IMPRESSION: No active cardiopulmonary disease. Electronically Signed   By: Earle Gell M.D.   On: 08/29/2015 12:55   Ct Head Wo Contrast  08/20/2015  CLINICAL DATA:  Acute onset of generalized weakness, blurred vision and confusion. Initial encounter. EXAM: CT HEAD WITHOUT CONTRAST TECHNIQUE: Contiguous axial images were obtained from the base of the skull through the vertex without intravenous contrast. COMPARISON:  CT of the head performed 08/14/2015 FINDINGS: There is no evidence of acute infarction, mass lesion, or intra- or extra-axial hemorrhage on CT. The recurrent evolving infarct in the left centrum semiovale is again noted. Prominence of the ventricles and sulci reflects mild cortical volume loss. Scattered periventricular and subcortical white matter change likely reflects small vessel ischemic microangiopathy. Cerebellar atrophy is noted. Small chronic lacunar infarcts are noted at the basal ganglia bilaterally. The brainstem and fourth ventricle are within normal limits. The basal ganglia are unremarkable in appearance. The cerebral hemispheres demonstrate grossly normal gray-white differentiation. No mass effect or midline shift is seen. There is no evidence of fracture; visualized osseous structures are unremarkable in  appearance. The orbits are within normal limits. The paranasal sinuses and mastoid air cells are well-aerated. No significant soft tissue abnormalities are seen. IMPRESSION: 1. No acute intracranial pathology seen on CT. 2. Evolving infarct at the left centrum semiovale again noted. 3. Mild cortical volume loss and scattered small vessel ischemic microangiopathy. 4. Small chronic lacunar infarcts at the basal ganglia bilaterally. Electronically Signed   By: Garald Balding M.D.   On: 08/20/2015 17:54   Ct Head Wo Contrast  08/14/2015  CLINICAL DATA:  Follow up stroke.  RIGHT-sided weakness. EXAM: CT HEAD WITHOUT CONTRAST TECHNIQUE: Contiguous axial images were obtained from the base of the skull through the vertex without intravenous contrast. COMPARISON:  Most recent MR 08/12/2015. Most recent CT head 08/12/2015. FINDINGS: Increasingly well defined hypoattenuation in the LEFT centrum semiovale representing the area of restricted diffusion on the recent MR. No definite new areas of acute infarction. No hemorrhagic transformation. Baseline atrophy with small vessel disease. Calvarium intact. No sinus or acute mastoid disease. Vascular calcification. IMPRESSION: Increasingly well-defined hypoattenuation representing acute infarction LEFT MCA territory. No hemorrhagic transformation. Electronically Signed   By: Staci Righter M.D.   On: 08/14/2015 17:14   Ct Head Wo Contrast  08/12/2015  CLINICAL DATA:  Recent stroke, post tPA with resolution of symptoms, readmitted for new RIGHT-sided weakness, slurred speech and facial droop, hypertension, type II diabetes mellitus, RIGHT breast cancer, hypercholesterolemia EXAM: CT HEAD WITHOUT CONTRAST TECHNIQUE: Contiguous axial images were obtained from the base of the skull through the vertex without intravenous contrast. COMPARISON:  08/10/2015 FINDINGS: Generalized atrophy. Normal ventricular morphology. No midline shift or mass effect. Extensive small vessel chronic  ischemic changes of deep cerebral white matter. No intracranial hemorrhage, mass lesion or evidence acute infarction. Again identified subacute nonhemorrhagic infarct anterior LEFT basal ganglia. No extra-axial fluid collections. Extensive atherosclerotic calcifications of internal carotid and vertebral arteries at skullbase. Bones and sinuses unremarkable. IMPRESSION: Atrophy with small vessel chronic ischemic changes of deep cerebral white matter. Subacute nonhemorrhagic infarct at LEFT basal ganglia unchanged. Extensive atherosclerotic calcification. No new intracranial abnormalities. Electronically Signed   By: Lavonia Dana M.D.   On: 08/12/2015 08:26   Ct  Head Wo Contrast  08/10/2015  CLINICAL DATA:  Code stroke. Right-sided weakness with right facial droop. EXAM: CT HEAD WITHOUT CONTRAST TECHNIQUE: Contiguous axial images were obtained from the base of the skull through the vertex without intravenous contrast. COMPARISON:  Head CT 07/29/2015, brain MRI 07/30/2015 FINDINGS: No intracranial hemorrhage. Expected evolution with minimal developing hypodensity in the left basal ganglia at site of acute infarct on MRI, no associated edema. No evidence territorial infarct or progressive acute ischemia. Background atrophy and chronic small vessel ischemia is stable in the interim. No cerebral edema, extra-axial fluid collection, mass effect or midline shift. Atherosclerosis again seen of the skullbase vasculature. Tiny mucous retention cyst right maxillary sinus. Calvarium is intact. IMPRESSION: 1. Expected evolution of the left basal ganglia infarct since prior exam. No interval hemorrhage, mass effect or midline shift. 2. Background chronic small vessel ischemia without CT findings of acute territorial infarct. These results were called by telephone at the time of interpretation on 08/10/2015 at 12:11 am to Dr. Janann Colonel , who verbally acknowledged these results. Electronically Signed   By: Jeb Levering M.D.   On:  08/10/2015 00:12   Mr Virgel Paling Wo Contrast  08/10/2015  CLINICAL DATA:  81 year old female with recent lacunar infarct in the left hemisphere earlier this month status post tPA. Recurrent right side weakness and facial droop. Initial encounter. EXAM: MRI HEAD WITHOUT CONTRAST MRA HEAD WITHOUT CONTRAST TECHNIQUE: Multiplanar, multiecho pulse sequences of the brain and surrounding structures were obtained without intravenous contrast. Angiographic images of the head were obtained using MRA technique without contrast. COMPARISON:  Head CT at 0007 hours today. Brain MRI and head and neck MRA 07/30/2015 FINDINGS: MRI HEAD FINDINGS Linear restricted diffusion extending from the posterior left corona radiata inferiorly toward the putamen and external capsule has faded since 07/30/2015. There is stable curvilinear restricted diffusion in the more anterior corona radiata (series 6, image 18). No new areas of diffusion restriction. Major intracranial vascular flow voids are stable. No interval hemorrhage. No intracranial mass effect. No new intracranial signal abnormality. No mass effect, evidence of mass lesion, ventriculomegaly, or extra-axial collection. Cervicomedullary junction and pituitary are within normal limits. Stable paranasal sinuses, mastoids, visualized internal auditory structures, orbits soft tissues, and scalp soft tissues. Negative visualized cervical spine. Bone marrow signal remains normal. MRA HEAD FINDINGS Stable posterior circulation, high-grade stenosis of the left vertebral artery V4 segment and asymmetrically decreased flow signal at the left PICA origin. Up to moderate proximal basilar artery stenosis. The basilar artery remains patent. SCA and PCAs are stable, mild to moderate left P1 segment stenosis. Posterior communicating arteries are diminutive or absent. Stable antegrade flow signal in the distal cervical ICAs. Stable ICA siphons with high-grade bilateral supraclinoid segment  irregularity. Both ophthalmic artery origins remain patent. Stable carotid termini. MCA and ACA origins remain patent. Stable visualized bilateral ACA branches. Stable visualized right MCA branches with mild mid M1 stenosis. Left MCA M1 segment and bifurcation irregularity and up to mild stenosis appears stable. Visualized left MCA branches are stable. IMPRESSION: 1. Expected evolution of the recent left MCA white matter infarct. No interval hemorrhage. No associated mass effect. 2. No new intracranial abnormality. 3. Stable severe intracranial atherosclerosis on MRA. Moderate to high-grade stenoses of the distal left vertebral artery, both distal ICA siphons, and the proximal basilar artery. Electronically Signed   By: Genevie Ann M.D.   On: 08/10/2015 14:21   Mr Brain Wo Contrast  08/12/2015  CLINICAL DATA:  Right upper and right  lower extremity weakness, asymmetric smile, rightward tongue deviation. Recent left MCA infarct. EXAM: MRI HEAD WITHOUT CONTRAST TECHNIQUE: Multiplanar, multiecho pulse sequences of the brain and surrounding structures were obtained without intravenous contrast. COMPARISON:  08/10/2015 FINDINGS: There is a 1.5 cm focus of restricted diffusion consistent with acute infarct in the left lateral lenticulostriate artery territory extending from the posterior body of the left caudate to the posterior left lentiform nucleus, predominantly involving the posterior corona radiata. This restricted diffusion is new from the MRI of 2 days ago. It is in the same locate as the infarct on the 07/30/2015 brain MRI, although it is more confluent and mildly larger in size. Residual abnormal diffusion signal more anteriorly in the left corona radiata on the prior MRI has further decreased, without residual restricted diffusion in this location. Mild T1 hyperintensity and susceptibility artifact in the left putamen, predominantly posteriorly, may reflect subacute petechial blood products related to the recent  ischemia. There is no parenchymal hematoma. A focus of chronic hemorrhage is again seen in the white matter of the right frontal lobe. There is no mass, midline shift, or extra-axial fluid collection. Moderate generalized cerebral atrophy and moderate chronic small vessel ischemic change in the cerebral white matter are stable. Small, chronic infarcts are again seen in the bilateral basal ganglia, thalami, deep cerebral white matter, and pons. Prior bilateral cataract extraction is noted. Tiny right maxillary sinus mucous retention cysts are again noted. Mastoid air cells are clear. Major intracranial vascular flow voids are preserved. IMPRESSION: 1. 1.5 cm recurrent acute infarct centered in the posterior left corona radiata, new from the MRI of 2 days ago. This is in the same location although is mildly larger than that on the 07/30/2015 MRI. 2. Chronic ischemic changes as above. Electronically Signed   By: Logan Bores M.D.   On: 08/12/2015 09:21   Mr Brain Wo Contrast  08/10/2015  CLINICAL DATA:  81 year old female with recent lacunar infarct in the left hemisphere earlier this month status post tPA. Recurrent right side weakness and facial droop. Initial encounter. EXAM: MRI HEAD WITHOUT CONTRAST MRA HEAD WITHOUT CONTRAST TECHNIQUE: Multiplanar, multiecho pulse sequences of the brain and surrounding structures were obtained without intravenous contrast. Angiographic images of the head were obtained using MRA technique without contrast. COMPARISON:  Head CT at 0007 hours today. Brain MRI and head and neck MRA 07/30/2015 FINDINGS: MRI HEAD FINDINGS Linear restricted diffusion extending from the posterior left corona radiata inferiorly toward the putamen and external capsule has faded since 07/30/2015. There is stable curvilinear restricted diffusion in the more anterior corona radiata (series 6, image 18). No new areas of diffusion restriction. Major intracranial vascular flow voids are stable. No interval  hemorrhage. No intracranial mass effect. No new intracranial signal abnormality. No mass effect, evidence of mass lesion, ventriculomegaly, or extra-axial collection. Cervicomedullary junction and pituitary are within normal limits. Stable paranasal sinuses, mastoids, visualized internal auditory structures, orbits soft tissues, and scalp soft tissues. Negative visualized cervical spine. Bone marrow signal remains normal. MRA HEAD FINDINGS Stable posterior circulation, high-grade stenosis of the left vertebral artery V4 segment and asymmetrically decreased flow signal at the left PICA origin. Up to moderate proximal basilar artery stenosis. The basilar artery remains patent. SCA and PCAs are stable, mild to moderate left P1 segment stenosis. Posterior communicating arteries are diminutive or absent. Stable antegrade flow signal in the distal cervical ICAs. Stable ICA siphons with high-grade bilateral supraclinoid segment irregularity. Both ophthalmic artery origins remain patent. Stable carotid termini.  MCA and ACA origins remain patent. Stable visualized bilateral ACA branches. Stable visualized right MCA branches with mild mid M1 stenosis. Left MCA M1 segment and bifurcation irregularity and up to mild stenosis appears stable. Visualized left MCA branches are stable. IMPRESSION: 1. Expected evolution of the recent left MCA white matter infarct. No interval hemorrhage. No associated mass effect. 2. No new intracranial abnormality. 3. Stable severe intracranial atherosclerosis on MRA. Moderate to high-grade stenoses of the distal left vertebral artery, both distal ICA siphons, and the proximal basilar artery. Electronically Signed   By: Genevie Ann M.D.   On: 08/10/2015 14:21   Dg Chest Port 1 View  08/20/2015  CLINICAL DATA:  81 year old with acute onset of blurred vision, generalized weakness and confusion earlier today while at the nursing home. EXAM: PORTABLE CHEST 1 VIEW COMPARISON:  08/10/2015 and earlier.  FINDINGS: Cardiac silhouette moderately enlarged, unchanged. Linear atelectasis involving the left lung base. Lungs otherwise clear. No localized airspace consolidation. No pleural effusions. No pneumothorax. Normal pulmonary vascularity. IMPRESSION: Stable cardiomegaly. Linear atelectasis involving the left lung base. No acute cardiopulmonary disease otherwise. Electronically Signed   By: Evangeline Dakin M.D.   On: 08/20/2015 17:39   Dg Chest Portable 1 View  08/10/2015  CLINICAL DATA:  Weakness. EXAM: PORTABLE CHEST 1 VIEW COMPARISON:  07/29/2015 FINDINGS: Lung volumes are low. The cardiomediastinal contours are unchanged. Pulmonary vasculature is normal. No consolidation, pleural effusion, or pneumothorax. No acute osseous abnormalities are seen. Remote left rib fracture is unchanged. IMPRESSION: No acute pulmonary process. Electronically Signed   By: Jeb Levering M.D.   On: 08/10/2015 02:27    CBC  Recent Labs Lab 08/29/15 1132 08/31/15 0804  WBC 23.9* 22.6*  HGB 14.4 13.2  HCT 43.1 39.6  PLT PLATELET CLUMPS NOTED ON SMEAR, COUNT APPEARS ADEQUATE 325  MCV 92.5 91.5  MCH 30.9 30.5  MCHC 33.4 33.3  RDW 14.7 14.4  LYMPHSABS 9.8* 9.7*  MONOABS 0.7 1.1*  EOSABS 0.2 0.2  BASOSABS 0.0 0.0    Chemistries   Recent Labs Lab 08/29/15 1132 08/31/15 0804  NA 136 135  K 4.1 3.9  CL 101 100*  CO2 23 21*  GLUCOSE 170* 181*  BUN 17 11  CREATININE 0.91 0.99  CALCIUM 9.9 9.5   ------------------------------------------------------------------------------------------------------------------ estimated creatinine clearance is 43.6 mL/min (by C-G formula based on Cr of 0.99). ------------------------------------------------------------------------------------------------------------------ No results for input(s): HGBA1C in the last 72 hours. ------------------------------------------------------------------------------------------------------------------ No results for input(s): CHOL,  HDL, LDLCALC, TRIG, CHOLHDL, LDLDIRECT in the last 72 hours. ------------------------------------------------------------------------------------------------------------------  Recent Labs  08/30/15 0445  TSH 3.327   ------------------------------------------------------------------------------------------------------------------ No results for input(s): VITAMINB12, FOLATE, FERRITIN, TIBC, IRON, RETICCTPCT in the last 72 hours.  Coagulation profile No results for input(s): INR, PROTIME in the last 168 hours.  No results for input(s): DDIMER in the last 72 hours.  Cardiac Enzymes  Recent Labs Lab 08/29/15 1703 08/29/15 2239 08/30/15 0448  TROPONINI <0.03 <0.03 0.03   ------------------------------------------------------------------------------------------------------------------ Invalid input(s): POCBNP    Letecia Arps D.O. on 09/01/2015 at 12:16 PM  Between 7am to 7pm - Pager - [phone]  After 7pm go to [email or web address] - password [email or web address]  And look for the night coverage person covering for me after hours  [facility] Office  [phone]

## 2015-09-01 NOTE — Progress Notes (Signed)
Pt a/o, no c/o pain, pt has right sided weakness d/t previous CVA, VSS, pt stable, plan is for pt to be d/c in the am

## 2015-09-01 NOTE — Progress Notes (Signed)
Dr. Baltazar Najjar notified of manual B/P 182/70 and patient stated she feels weak and she just does not feel good, Dr. Baltazar Najjar instructed to give Hydralazine, however patient refused medication, she stated she was told by Dr. Mare Ferrari not to take hydrazaline secondary to her B/P dropping to quickly.  No new orders given by Dr. Baltazar Najjar currently, will con't to monitor, pharmacy notified of medication allergy.

## 2015-09-01 NOTE — Progress Notes (Signed)
Patient discussed with Dr. Ree Kida.  DC is anticipated tomorrow if patient is medically stable. DC to [facility] per request of daughter who toured the facility and signed admit papers today.  Daughter is aware of planned d/c tomorrow as is patient.  Per recommendation of Lovell Sheehan, NP- Palliative- will plan palliative care referral at the SNF during "rehab" period- however- due to concerns by MD and NP about patient's medical condition and unsteady blood pressure- it is doubtful that patient will be able to tolerate much Physical Therapy.  MD recommends slow, low level PT at most for patient. Discussed above in detail with Admissions- [facility] at [facility]. Should patient not be able to tolerate PT- will need to change level of care to long term care- Hospice services.  She has discussed this in detail with patient's daughter Molly [name]. CSW also spoke yesterday in detail with daughter information re: Medicaid vs private pay options for nursing home care.  Molly [name]. Molly [name], [place]

## 2015-09-01 NOTE — Progress Notes (Signed)
Patient Name: Molly [name] Date of Encounter: 09/01/2015     Principal Problem:   Syncope and collapse Active Problems:   Breast cancer metastasized to bone [facility])   Type 2 diabetes mellitus with peripheral neuropathy (HCC)   Heart murmur, systolic   CLL (chronic lymphocytic leukemia) (HCC)   CVA (cerebral vascular accident) ([place])   Intracranial carotid stenosis   Orthostatic hypotension   HLD (hyperlipidemia)   Recurrent UTI   Pressure ulcer    SUBJECTIVE  The patient is feeling better today.  Anticipates going to skilled nursing facility soon.  Blood pressure today is satisfactory while lying in bed.  Family is concerned because she has a history of blood pressure dropping when she is upright.  She states she has not been out of bed for several days.  Because of her history of orthostatic blood pressure drop we are allowing permissive hypertension.  She does not tolerate hydralazine.  CURRENT MEDS . amLODipine  10 mg Oral Daily  . anastrozole  1 mg Oral Daily  . atenolol  25 mg Oral TID  . clopidogrel  75 mg Oral Daily  . enoxaparin (LOVENOX) injection  40 mg Subcutaneous Q24H  . ezetimibe  10 mg Oral q1800  . insulin aspart  0-9 Units Subcutaneous TID WC  . insulin glargine  10 Units Subcutaneous Q1200  . irbesartan  300 mg Oral Daily  . levothyroxine  75 mcg Oral QAC breakfast  . polyethylene glycol  17 g Oral Daily  . sodium chloride  3 mL Intravenous Q12H  . sodium chloride  3 mL Intravenous Q12H  . vitamin B-12  500 mcg Oral Q lunch    OBJECTIVE  Filed Vitals:   09/01/15 0400 09/01/15 0413 09/01/15 0534 09/01/15 0810  BP: 191/54 181/62 182/70 169/53  Pulse:    65  Temp:    97.7 F (36.5 C)  TempSrc:    Oral  Resp:      Height:      Weight: 222 lb 4.8 oz (100.835 kg)     SpO2:    99%    Intake/Output Summary (Last 24 hours) at 09/01/15 0956 Last data filed at 08/31/15 2043  Gross per 24 hour  Intake    580 ml  Output      0 ml  Net    580 ml     Filed Weights   08/30/15 0454 08/31/15 0432 09/01/15 0400  Weight: 218 lb 3.2 oz (98.975 kg) 218 lb 8.6 oz (99.129 kg) 222 lb 4.8 oz (100.835 kg)    PHYSICAL EXAM  General: Pleasant, NAD.  Slurred speech from stroke Neuro: Alert and oriented X 3.  Dense right hemiparesis Psych: Normal affect. HEENT:  Normal  Neck: Bilateral carotid bruits. Lungs:  Resp regular and unlabored, CTA. Heart: RRR no s3, s4, grade 2/6 systolic ejection murmur at the base. Abdomen: Soft, non-tender, non-distended, BS + x 4.  Extremities: No clubbing, cyanosis or edema. DP/PT/Radials 2+ and equal bilaterally.  Accessory Clinical Findings  CBC  Recent Labs  08/29/15 1132 08/31/15 0804  WBC 23.9* 22.6*  NEUTROABS 13.2* 11.6*  HGB 14.4 13.2  HCT 43.1 39.6  MCV 92.5 91.5  PLT PLATELET CLUMPS NOTED ON SMEAR, COUNT APPEARS ADEQUATE 629   Basic Metabolic Panel  Recent Labs  08/29/15 1132 08/31/15 0804  NA 136 135  K 4.1 3.9  CL 101 100*  CO2 23 21*  GLUCOSE 170* 181*  BUN 17 11  CREATININE 0.91 0.99  CALCIUM 9.9 9.5   Liver Function Tests No results for input(s): AST, ALT, ALKPHOS, BILITOT, PROT, ALBUMIN in the last 72 hours. No results for input(s): LIPASE, AMYLASE in the last 72 hours. Cardiac Enzymes  Recent Labs  08/29/15 1703 08/29/15 2239 08/30/15 0448  TROPONINI <0.03 <0.03 0.03   BNP Invalid input(s): POCBNP D-Dimer No results for input(s): DDIMER in the last 72 hours. Hemoglobin A1C No results for input(s): HGBA1C in the last 72 hours. Fasting Lipid Panel No results for input(s): CHOL, HDL, LDLCALC, TRIG, CHOLHDL, LDLDIRECT in the last 72 hours. Thyroid Function Tests  Recent Labs  08/30/15 0445  TSH 3.327    TELE  Normal sinus rhythm  ECG    Radiology/Studies  Dg Chest 2 View  08/29/2015  CLINICAL DATA:  Syncopal episode. EXAM: CHEST  2 VIEW COMPARISON:  08/20/2015 FINDINGS: The heart size and mediastinal contours are within normal limits. Both  lungs are clear. No evidence of pneumothorax or pleural effusion . The visualized skeletal structures are unremarkable. IMPRESSION: No active cardiopulmonary disease. Electronically Signed   By: Earle Gell M.D.   On: 08/29/2015 12:55   Ct Head Wo Contrast  08/20/2015  CLINICAL DATA:  Acute onset of generalized weakness, blurred vision and confusion. Initial encounter. EXAM: CT HEAD WITHOUT CONTRAST TECHNIQUE: Contiguous axial images were obtained from the base of the skull through the vertex without intravenous contrast. COMPARISON:  CT of the head performed 08/14/2015 FINDINGS: There is no evidence of acute infarction, mass lesion, or intra- or extra-axial hemorrhage on CT. The recurrent evolving infarct in the left centrum semiovale is again noted. Prominence of the ventricles and sulci reflects mild cortical volume loss. Scattered periventricular and subcortical white matter change likely reflects small vessel ischemic microangiopathy. Cerebellar atrophy is noted. Small chronic lacunar infarcts are noted at the basal ganglia bilaterally. The brainstem and fourth ventricle are within normal limits. The basal ganglia are unremarkable in appearance. The cerebral hemispheres demonstrate grossly normal gray-white differentiation. No mass effect or midline shift is seen. There is no evidence of fracture; visualized osseous structures are unremarkable in appearance. The orbits are within normal limits. The paranasal sinuses and mastoid air cells are well-aerated. No significant soft tissue abnormalities are seen. IMPRESSION: 1. No acute intracranial pathology seen on CT. 2. Evolving infarct at the left centrum semiovale again noted. 3. Mild cortical volume loss and scattered small vessel ischemic microangiopathy. 4. Small chronic lacunar infarcts at the basal ganglia bilaterally. Electronically Signed   By: Garald Balding M.D.   On: 08/20/2015 17:54   Ct Head Wo Contrast  08/14/2015  CLINICAL DATA:  Follow up  stroke.  RIGHT-sided weakness. EXAM: CT HEAD WITHOUT CONTRAST TECHNIQUE: Contiguous axial images were obtained from the base of the skull through the vertex without intravenous contrast. COMPARISON:  Most recent MR 08/12/2015. Most recent CT head 08/12/2015. FINDINGS: Increasingly well defined hypoattenuation in the LEFT centrum semiovale representing the area of restricted diffusion on the recent MR. No definite new areas of acute infarction. No hemorrhagic transformation. Baseline atrophy with small vessel disease. Calvarium intact. No sinus or acute mastoid disease. Vascular calcification. IMPRESSION: Increasingly well-defined hypoattenuation representing acute infarction LEFT MCA territory. No hemorrhagic transformation. Electronically Signed   By: Staci Righter M.D.   On: 08/14/2015 17:14   Ct Head Wo Contrast  08/12/2015  CLINICAL DATA:  Recent stroke, post tPA with resolution of symptoms, readmitted for new RIGHT-sided weakness, slurred speech and facial droop, hypertension, type II diabetes mellitus, RIGHT breast  cancer, hypercholesterolemia EXAM: CT HEAD WITHOUT CONTRAST TECHNIQUE: Contiguous axial images were obtained from the base of the skull through the vertex without intravenous contrast. COMPARISON:  08/10/2015 FINDINGS: Generalized atrophy. Normal ventricular morphology. No midline shift or mass effect. Extensive small vessel chronic ischemic changes of deep cerebral white matter. No intracranial hemorrhage, mass lesion or evidence acute infarction. Again identified subacute nonhemorrhagic infarct anterior LEFT basal ganglia. No extra-axial fluid collections. Extensive atherosclerotic calcifications of internal carotid and vertebral arteries at skullbase. Bones and sinuses unremarkable. IMPRESSION: Atrophy with small vessel chronic ischemic changes of deep cerebral white matter. Subacute nonhemorrhagic infarct at LEFT basal ganglia unchanged. Extensive atherosclerotic calcification. No new  intracranial abnormalities. Electronically Signed   By: Lavonia Dana M.D.   On: 08/12/2015 08:26   Ct Head Wo Contrast  08/10/2015  CLINICAL DATA:  Code stroke. Right-sided weakness with right facial droop. EXAM: CT HEAD WITHOUT CONTRAST TECHNIQUE: Contiguous axial images were obtained from the base of the skull through the vertex without intravenous contrast. COMPARISON:  Head CT 07/29/2015, brain MRI 07/30/2015 FINDINGS: No intracranial hemorrhage. Expected evolution with minimal developing hypodensity in the left basal ganglia at site of acute infarct on MRI, no associated edema. No evidence territorial infarct or progressive acute ischemia. Background atrophy and chronic small vessel ischemia is stable in the interim. No cerebral edema, extra-axial fluid collection, mass effect or midline shift. Atherosclerosis again seen of the skullbase vasculature. Tiny mucous retention cyst right maxillary sinus. Calvarium is intact. IMPRESSION: 1. Expected evolution of the left basal ganglia infarct since prior exam. No interval hemorrhage, mass effect or midline shift. 2. Background chronic small vessel ischemia without CT findings of acute territorial infarct. These results were called by telephone at the time of interpretation on 08/10/2015 at 12:11 am to Dr. Janann Colonel , who verbally acknowledged these results. Electronically Signed   By: Jeb Levering M.D.   On: 08/10/2015 00:12   Mr Virgel Paling Wo Contrast  08/10/2015  CLINICAL DATA:  81 year old female with recent lacunar infarct in the left hemisphere earlier this month status post tPA. Recurrent right side weakness and facial droop. Initial encounter. EXAM: MRI HEAD WITHOUT CONTRAST MRA HEAD WITHOUT CONTRAST TECHNIQUE: Multiplanar, multiecho pulse sequences of the brain and surrounding structures were obtained without intravenous contrast. Angiographic images of the head were obtained using MRA technique without contrast. COMPARISON:  Head CT at 0007 hours today.  Brain MRI and head and neck MRA 07/30/2015 FINDINGS: MRI HEAD FINDINGS Linear restricted diffusion extending from the posterior left corona radiata inferiorly toward the putamen and external capsule has faded since 07/30/2015. There is stable curvilinear restricted diffusion in the more anterior corona radiata (series 6, image 18). No new areas of diffusion restriction. Major intracranial vascular flow voids are stable. No interval hemorrhage. No intracranial mass effect. No new intracranial signal abnormality. No mass effect, evidence of mass lesion, ventriculomegaly, or extra-axial collection. Cervicomedullary junction and pituitary are within normal limits. Stable paranasal sinuses, mastoids, visualized internal auditory structures, orbits soft tissues, and scalp soft tissues. Negative visualized cervical spine. Bone marrow signal remains normal. MRA HEAD FINDINGS Stable posterior circulation, high-grade stenosis of the left vertebral artery V4 segment and asymmetrically decreased flow signal at the left PICA origin. Up to moderate proximal basilar artery stenosis. The basilar artery remains patent. SCA and PCAs are stable, mild to moderate left P1 segment stenosis. Posterior communicating arteries are diminutive or absent. Stable antegrade flow signal in the distal cervical ICAs. Stable ICA siphons with high-grade bilateral  supraclinoid segment irregularity. Both ophthalmic artery origins remain patent. Stable carotid termini. MCA and ACA origins remain patent. Stable visualized bilateral ACA branches. Stable visualized right MCA branches with mild mid M1 stenosis. Left MCA M1 segment and bifurcation irregularity and up to mild stenosis appears stable. Visualized left MCA branches are stable. IMPRESSION: 1. Expected evolution of the recent left MCA white matter infarct. No interval hemorrhage. No associated mass effect. 2. No new intracranial abnormality. 3. Stable severe intracranial atherosclerosis on MRA.  Moderate to high-grade stenoses of the distal left vertebral artery, both distal ICA siphons, and the proximal basilar artery. Electronically Signed   By: Genevie Ann M.D.   On: 08/10/2015 14:21   Mr Brain Wo Contrast  08/12/2015  CLINICAL DATA:  Right upper and right lower extremity weakness, asymmetric smile, rightward tongue deviation. Recent left MCA infarct. EXAM: MRI HEAD WITHOUT CONTRAST TECHNIQUE: Multiplanar, multiecho pulse sequences of the brain and surrounding structures were obtained without intravenous contrast. COMPARISON:  08/10/2015 FINDINGS: There is a 1.5 cm focus of restricted diffusion consistent with acute infarct in the left lateral lenticulostriate artery territory extending from the posterior body of the left caudate to the posterior left lentiform nucleus, predominantly involving the posterior corona radiata. This restricted diffusion is new from the MRI of 2 days ago. It is in the same locate as the infarct on the 07/30/2015 brain MRI, although it is more confluent and mildly larger in size. Residual abnormal diffusion signal more anteriorly in the left corona radiata on the prior MRI has further decreased, without residual restricted diffusion in this location. Mild T1 hyperintensity and susceptibility artifact in the left putamen, predominantly posteriorly, may reflect subacute petechial blood products related to the recent ischemia. There is no parenchymal hematoma. A focus of chronic hemorrhage is again seen in the white matter of the right frontal lobe. There is no mass, midline shift, or extra-axial fluid collection. Moderate generalized cerebral atrophy and moderate chronic small vessel ischemic change in the cerebral white matter are stable. Small, chronic infarcts are again seen in the bilateral basal ganglia, thalami, deep cerebral white matter, and pons. Prior bilateral cataract extraction is noted. Tiny right maxillary sinus mucous retention cysts are again noted. Mastoid air  cells are clear. Major intracranial vascular flow voids are preserved. IMPRESSION: 1. 1.5 cm recurrent acute infarct centered in the posterior left corona radiata, new from the MRI of 2 days ago. This is in the same location although is mildly larger than that on the 07/30/2015 MRI. 2. Chronic ischemic changes as above. Electronically Signed   By: Logan Bores M.D.   On: 08/12/2015 09:21   Mr Brain Wo Contrast  08/10/2015  CLINICAL DATA:  81 year old female with recent lacunar infarct in the left hemisphere earlier this month status post tPA. Recurrent right side weakness and facial droop. Initial encounter. EXAM: MRI HEAD WITHOUT CONTRAST MRA HEAD WITHOUT CONTRAST TECHNIQUE: Multiplanar, multiecho pulse sequences of the brain and surrounding structures were obtained without intravenous contrast. Angiographic images of the head were obtained using MRA technique without contrast. COMPARISON:  Head CT at 0007 hours today. Brain MRI and head and neck MRA 07/30/2015 FINDINGS: MRI HEAD FINDINGS Linear restricted diffusion extending from the posterior left corona radiata inferiorly toward the putamen and external capsule has faded since 07/30/2015. There is stable curvilinear restricted diffusion in the more anterior corona radiata (series 6, image 18). No new areas of diffusion restriction. Major intracranial vascular flow voids are stable. No interval hemorrhage. No intracranial  mass effect. No new intracranial signal abnormality. No mass effect, evidence of mass lesion, ventriculomegaly, or extra-axial collection. Cervicomedullary junction and pituitary are within normal limits. Stable paranasal sinuses, mastoids, visualized internal auditory structures, orbits soft tissues, and scalp soft tissues. Negative visualized cervical spine. Bone marrow signal remains normal. MRA HEAD FINDINGS Stable posterior circulation, high-grade stenosis of the left vertebral artery V4 segment and asymmetrically decreased flow signal  at the left PICA origin. Up to moderate proximal basilar artery stenosis. The basilar artery remains patent. SCA and PCAs are stable, mild to moderate left P1 segment stenosis. Posterior communicating arteries are diminutive or absent. Stable antegrade flow signal in the distal cervical ICAs. Stable ICA siphons with high-grade bilateral supraclinoid segment irregularity. Both ophthalmic artery origins remain patent. Stable carotid termini. MCA and ACA origins remain patent. Stable visualized bilateral ACA branches. Stable visualized right MCA branches with mild mid M1 stenosis. Left MCA M1 segment and bifurcation irregularity and up to mild stenosis appears stable. Visualized left MCA branches are stable. IMPRESSION: 1. Expected evolution of the recent left MCA white matter infarct. No interval hemorrhage. No associated mass effect. 2. No new intracranial abnormality. 3. Stable severe intracranial atherosclerosis on MRA. Moderate to high-grade stenoses of the distal left vertebral artery, both distal ICA siphons, and the proximal basilar artery. Electronically Signed   By: Genevie Ann M.D.   On: 08/10/2015 14:21   Dg Chest Port 1 View  08/20/2015  CLINICAL DATA:  81 year old with acute onset of blurred vision, generalized weakness and confusion earlier today while at the nursing home. EXAM: PORTABLE CHEST 1 VIEW COMPARISON:  08/10/2015 and earlier. FINDINGS: Cardiac silhouette moderately enlarged, unchanged. Linear atelectasis involving the left lung base. Lungs otherwise clear. No localized airspace consolidation. No pleural effusions. No pneumothorax. Normal pulmonary vascularity. IMPRESSION: Stable cardiomegaly. Linear atelectasis involving the left lung base. No acute cardiopulmonary disease otherwise. Electronically Signed   By: Evangeline Dakin M.D.   On: 08/20/2015 17:39   Dg Chest Portable 1 View  08/10/2015  CLINICAL DATA:  Weakness. EXAM: PORTABLE CHEST 1 VIEW COMPARISON:  07/29/2015 FINDINGS: Lung  volumes are low. The cardiomediastinal contours are unchanged. Pulmonary vasculature is normal. No consolidation, pleural effusion, or pneumothorax. No acute osseous abnormalities are seen. Remote left rib fracture is unchanged. IMPRESSION: No acute pulmonary process. Electronically Signed   By: Jeb Levering M.D.   On: 08/10/2015 02:27    ASSESSMENT AND PLAN 1. Chest pain - ruled out for MI. May be due to labile BPs. EKG with sinus brady and chronic RBBB. Suspect continued supportive care in setting of multiple comorbidities is most appropriate.  2. H/o syncope/pre-syncope - related to hypotension in neurology office. BB reduced due to sinus bradycardia. She is not a candidate for PPM because of multiple comorbidities.  3. Labile HTN with intermittent hypotension - this is not a new issue. She was taking amlodipine 10mg , atenolol 50mg  TID, clonidine 0.1mg  BID, irbesartan 300mg  qd, and spironolactone 12.5 mg qd prior to admission. Per Dr. Phoebe Sharps most recent note, recent CVAs most likely due to hypoperfusion 2/2 hypotension. She may need permissive HTN to maintain cerebral perfusion. IM has held spironolactone and clonidine for permissive BP up to 161 systolic which is definitely reasonable in a 81 year old woman who is approaching comfort care. Atenolol is also being reduced. .  4. Sinus bradycardia -resolved with lower dose of atenolol  5. Aortic stenosis - mild by recent echocardiogram.  Recommendation: Would continue current cardiac meds.  Would  allow permissive hypertension up to 180.  Above 180 could give an additional dose of atenolol which she tolerates.  Would attempt to increase activity and get her out of bed and observe blood pressure response.  Anticipate transfer to skilled nursing facility soon.   Signed, Darlin Coco MD

## 2015-09-02 ENCOUNTER — Other Ambulatory Visit: Payer: Self-pay | Admitting: Licensed Clinical Social Worker

## 2015-09-02 DIAGNOSIS — I951 Orthostatic hypotension: Secondary | ICD-10-CM | POA: Diagnosis not present

## 2015-09-02 DIAGNOSIS — Z66 Do not resuscitate: Secondary | ICD-10-CM

## 2015-09-02 DIAGNOSIS — R531 Weakness: Secondary | ICD-10-CM

## 2015-09-02 DIAGNOSIS — C50919 Malignant neoplasm of unspecified site of unspecified female breast: Secondary | ICD-10-CM | POA: Diagnosis not present

## 2015-09-02 DIAGNOSIS — I63032 Cerebral infarction due to thrombosis of left carotid artery: Secondary | ICD-10-CM | POA: Diagnosis not present

## 2015-09-02 DIAGNOSIS — R55 Syncope and collapse: Secondary | ICD-10-CM | POA: Diagnosis not present

## 2015-09-02 LAB — GLUCOSE, CAPILLARY
Glucose-Capillary: 191 mg/dL — ABNORMAL HIGH (ref 65–99)
Glucose-Capillary: 231 mg/dL — ABNORMAL HIGH (ref 65–99)

## 2015-09-02 MED ORDER — CLOPIDOGREL BISULFATE 75 MG PO TABS: 75.0000 mg | ORAL_TABLET | Freq: Every day | ORAL | Status: DC

## 2015-09-02 MED ORDER — CYANOCOBALAMIN 500 MCG PO TABS: 500.0000 ug | ORAL_TABLET | Freq: Every day | ORAL | Status: DC

## 2015-09-02 MED ORDER — ALUM & MAG HYDROXIDE-SIMETH 200-200-20 MG/5ML PO SUSP
30.0000 mL | Freq: Four times a day (QID) | ORAL | Status: AC | PRN
Start: 2015-09-02 — End: ?

## 2015-09-02 MED ORDER — INSULIN GLARGINE 100 UNIT/ML ~~LOC~~ SOLN: 10.0000 [IU] | Freq: Every day | SUBCUTANEOUS | Status: DC

## 2015-09-02 MED ORDER — POLYETHYLENE GLYCOL 3350 17 G PO PACK: 17.0000 g | PACK | Freq: Every day | ORAL | Status: DC | PRN

## 2015-09-02 MED ORDER — ATENOLOL 25 MG PO TABS: 25.0000 mg | ORAL_TABLET | Freq: Three times a day (TID) | ORAL | Status: DC

## 2015-09-02 NOTE — Discharge Summary (Signed)
Physician Discharge Summary  Molly [name]:[id number] DOB: 09/24/1923 DOA: 08/29/2015  PCP: Molly Blackbird, MD  Admit date: 08/29/2015 Discharge date: 09/02/2015  Time spent: 45 minutes  Recommendations for Outpatient Follow-up:  Patient will be discharged to [facility] facility.  Patient to continue physical therapy and occupational therapy as recommended by the facility.  Physical therapy should be tailored to the patient's ability and as blood pressure allows.  Patient will need to follow up with primary care provider within one week of discharge.  Patient should continue medications as prescribed.  Patient should follow a dysphagia 3 diet.   Discharge Diagnoses:  Syncope collapse Labile hypertension with intermittent hypotension Sinus bradycardia Breast cancer with leukocytosis Diabetes mellitus, type II Hyperlipidemia History of CVA/intracranial carotid stenosis Recurrent UTI Pressure ulcer, stage III  Discharge Condition: Stable  Diet recommendation: Dysphagia 3 diet  Filed Weights   08/31/15 0432 09/01/15 0400 09/02/15 0413  Weight: 99.129 kg (218 lb 8.6 oz) 100.835 kg (222 lb 4.8 oz) 97.75 kg (215 lb 8 oz)    History of present illness:  on 08/29/2015 by [name], [place] Molly [name] is a 81 y.o. 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 Phoebe [name] neurology office with chest pain after a syncopal episode. In brief, [name] had a stroke in September 2016 and received TPA. She was readmitted in October with progression of the stroke and while in the hospital had a second stroke. She was found to have bilateral severe carotid stenosis. During the same hospitalization she had a diverticular bleed. She developed very labile blood pressures. Since discharge she has had 2 syncopal episodes at the SNF. She  had a third syncopal episode today in the neurology out patient office. After her syncopal episode she developed chest pain. The patient says she has similar chest pain whenever she takes several pills at the same time. Today's chest pain is different because it has not resolved over the last several hours. It is located just left of her sternum and does not radiate it is dull in nature.  In the ER her white count is 23.9 (this is baseline). Urine is positive for UA. Systolic blood pressure has ranged from 89-187. Pulse rate has ranged from 40-65.  Hospital Course:  Syncope and collapse -Likely secondary to labile blood pressures versus ICA stenosis -PT consulted recommended SNF -Have asked for repeated orthostatics and to have patient OOB  Labile hypertension with intermittent hypotension -It seems to be an ongoing issue -Cardiology consultation appreciated, allow for permissive hypertension -Atenolol dose reduced -Continue amlodipine, irbesartan  Sinus bradycardia -Per cardiology, patient is not pacemaker candidate due to multiple comorbidities -Atenolol reduced  Breast cancer with leukocytosis -Follow up with oncology at discharge  -Continue Arimidex -Patient has chronic leukocytosis  Diabetes mellitus, type 2  -Continue Lantus, insulin sliding scale CBG monitoring  H/o CVA/intracranial carotid stenosis -Continue Plavix  Hyperlipidemia -Continue zetia  Recurrent UTI -Completed course of antibiotics -Urine culture showed multiple species  Pressure ulcer, stage 3 -Sacrum -Wound care consulted  Code Status: DNR  Procedures  None  Consults  Cardiology  Discharge Exam: Filed Vitals:   09/02/15 0913  BP: 165/50  Pulse: 67  Temp: 97.8 F (36.6 C)  Resp: 20   Exam  General: Well developed, well nourished, NAD  HEENT: NCAT, mucous membranes moist.   Cardiovascular: S1 S2 auscultated, 2/6 SEM, RRR  Respiratory: Clear to ausculation  bilaterally  Abdomen: Soft, nontender, nondistended, + bowel sounds  Extremities: warm dry without cyanosis clubbing or edema  Neuro: AAOx3, Right sided hemiparesis/weakness and facial droop (not new)  Psych: Normal affect and demeanor, pleasant  Discharge Instructions      Discharge Instructions    Discharge instructions    Complete by:  As directed   Patient will be discharged to [place] facility.  Patient to continue physical therapy and occupational therapy as recommended by the facility.  Physical therapy should be tailored to the patient's ability and as blood pressure allows.  Patient will need to follow up with primary care provider within one week of discharge.  Patient should continue medications as prescribed.  Patient should follow a dysphagia 3 diet.            Medication List    STOP taking these medications        cephALEXin 500 MG capsule  Commonly known as:  KEFLEX     cloNIDine 0.1 MG tablet  Commonly known as:  CATAPRES     LANTUS SOLOSTAR 100 UNIT/ML Solostar Pen  Generic drug:  Insulin Glargine  Replaced by:  insulin glargine 100 UNIT/ML injection     potassium chloride 10 MEQ tablet  Commonly known as:  KLOR-CON M10     spironolactone 25 MG tablet  Commonly known as:  ALDACTONE      TAKE these medications        ACCU-CHEK SMARTVIEW test strip  Generic drug:  glucose blood     alum & mag hydroxide-simeth 132-440-10 MG/5ML suspension  Commonly known as:  MAALOX/MYLANTA  Take 30 mLs by mouth every 6 (six) hours as needed for indigestion or heartburn (dyspepsia).     amLODipine 10 MG tablet  Commonly known as:  NORVASC  Take 1 tablet (10 mg total) by mouth daily.     anastrozole 1 MG tablet  Commonly known as:  ARIMIDEX  TAKE 1 TABLET BY MOUTH EVERY DAY     atenolol 25 MG tablet  Commonly known as:  TENORMIN  Take 1 tablet (25 mg total) by mouth 3 (three) times daily.     B-12 500 MCG Tabs  Take 500 mcg by mouth daily after  lunch.     cyanocobalamin 500 MCG tablet  Take 1 tablet (500 mcg total) by mouth daily with lunch.     CALCIUM + D PO  Take 1 tablet by mouth daily after lunch.     clopidogrel 75 MG tablet  Commonly known as:  PLAVIX  Take 1 tablet (75 mg total) by mouth daily.     ezetimibe 10 MG tablet  Commonly known as:  ZETIA  Take 1 tablet (10 mg total) by mouth daily.     FERROCITE 324 (106 FE) MG Tabs  Generic drug:  Ferrous Fumarate  TAKE 1 TABLET (106 MG OF IRON TOTAL) BY MOUTH DAILY.     insulin glargine 100 UNIT/ML injection  Commonly known as:  LANTUS  Inject 0.1 mLs (10 Units total) into the skin daily at 12 noon.     irbesartan 300 MG tablet  Commonly known as:  AVAPRO  Take 1 tablet (300 mg total) by mouth daily.     levothyroxine 75 MCG tablet  Commonly known as:  SYNTHROID, LEVOTHROID  Take 75 mcg by mouth daily before breakfast.     polyethylene glycol packet  Commonly known as:  MIRALAX / GLYCOLAX  Take 17 g  by mouth daily.     polyethylene glycol packet  Commonly known as:  MIRALAX / GLYCOLAX  Take 17 g by mouth daily as needed for mild constipation.       Allergies  Allergen Reactions  . Amlodipine Swelling    Currently taking exforge, separate med-Amlodipiine caused lower leg edema  . Contrast Media [Iodinated Diagnostic Agents] Nausea Only and Other (See Comments)    Patient experienced chills, nausea, hypothermic symptoms, also weird feelings  . Hctz [Hydrochlorothiazide] Other (See Comments)    Severe hypotension  . Hydralazine Other (See Comments)    Severe hypotension-suddenly and drastically dropped BP  . Metoprolol Other (See Comments)    Severe hypotension-suddenly and drastically dropped BP, same as hydralazine  . Codeine Other (See Comments)    Makes her feel "crazy."  . Demerol Other (See Comments)    Makes her feel "crazy."  . Librium Other (See Comments)    Makes her feel "crazy."  . Lipitor [Atorvastatin Calcium] Other (See Comments)     Makes her feel "crazy."   Follow-up Information    Follow up with FULP, CAMMIE, MD On 09/09/2015.   Specialty:  Family Medicine   Why:  1:30 PM   Contact information:   [address] [place] [address] [phone]        The results of significant diagnostics from this hospitalization (including imaging, microbiology, ancillary and laboratory) are listed below for reference.    Significant Diagnostic Studies: Dg Chest 2 View  08/29/2015  CLINICAL DATA:  Syncopal episode. EXAM: CHEST  2 VIEW COMPARISON:  08/20/2015 FINDINGS: The heart size and mediastinal contours are within normal limits. Both lungs are clear. No evidence of pneumothorax or pleural effusion . The visualized skeletal structures are unremarkable. IMPRESSION: No active cardiopulmonary disease. Electronically Signed   By: Earle Gell M.D.   On: 08/29/2015 12:55   Ct Head Wo Contrast  08/20/2015  CLINICAL DATA:  Acute onset of generalized weakness, blurred vision and confusion. Initial encounter. EXAM: CT HEAD WITHOUT CONTRAST TECHNIQUE: Contiguous axial images were obtained from the base of the skull through the vertex without intravenous contrast. COMPARISON:  CT of the head performed 08/14/2015 FINDINGS: There is no evidence of acute infarction, mass lesion, or intra- or extra-axial hemorrhage on CT. The recurrent evolving infarct in the left centrum semiovale is again noted. Prominence of the ventricles and sulci reflects mild cortical volume loss. Scattered periventricular and subcortical white matter change likely reflects small vessel ischemic microangiopathy. Cerebellar atrophy is noted. Small chronic lacunar infarcts are noted at the basal ganglia bilaterally. The brainstem and fourth ventricle are within normal limits. The basal ganglia are unremarkable in appearance. The cerebral hemispheres demonstrate grossly normal gray-white differentiation. No mass effect or midline shift is seen. There is no evidence of  fracture; visualized osseous structures are unremarkable in appearance. The orbits are within normal limits. The paranasal sinuses and mastoid air cells are well-aerated. No significant soft tissue abnormalities are seen. IMPRESSION: 1. No acute intracranial pathology seen on CT. 2. Evolving infarct at the left centrum semiovale again noted. 3. Mild cortical volume loss and scattered small vessel ischemic microangiopathy. 4. Small chronic lacunar infarcts at the basal ganglia bilaterally. Electronically Signed   By: Garald Balding M.D.   On: 08/20/2015 17:54   Ct Head Wo Contrast  08/14/2015  CLINICAL DATA:  Follow up stroke.  RIGHT-sided weakness. EXAM: CT HEAD WITHOUT CONTRAST TECHNIQUE: Contiguous axial images were obtained from the base of the  skull through the vertex without intravenous contrast. COMPARISON:  Most recent MR 08/12/2015. Most recent CT head 08/12/2015. FINDINGS: Increasingly well defined hypoattenuation in the LEFT centrum semiovale representing the area of restricted diffusion on the recent MR. No definite new areas of acute infarction. No hemorrhagic transformation. Baseline atrophy with small vessel disease. Calvarium intact. No sinus or acute mastoid disease. Vascular calcification. IMPRESSION: Increasingly well-defined hypoattenuation representing acute infarction LEFT MCA territory. No hemorrhagic transformation. Electronically Signed   By: Staci Righter M.D.   On: 08/14/2015 17:14   Ct Head Wo Contrast  08/12/2015  CLINICAL DATA:  Recent stroke, post tPA with resolution of symptoms, readmitted for new RIGHT-sided weakness, slurred speech and facial droop, hypertension, type II diabetes mellitus, RIGHT breast cancer, hypercholesterolemia EXAM: CT HEAD WITHOUT CONTRAST TECHNIQUE: Contiguous axial images were obtained from the base of the skull through the vertex without intravenous contrast. COMPARISON:  08/10/2015 FINDINGS: Generalized atrophy. Normal ventricular morphology. No  midline shift or mass effect. Extensive small vessel chronic ischemic changes of deep cerebral white matter. No intracranial hemorrhage, mass lesion or evidence acute infarction. Again identified subacute nonhemorrhagic infarct anterior LEFT basal ganglia. No extra-axial fluid collections. Extensive atherosclerotic calcifications of internal carotid and vertebral arteries at skullbase. Bones and sinuses unremarkable. IMPRESSION: Atrophy with small vessel chronic ischemic changes of deep cerebral white matter. Subacute nonhemorrhagic infarct at LEFT basal ganglia unchanged. Extensive atherosclerotic calcification. No new intracranial abnormalities. Electronically Signed   By: Lavonia Dana M.D.   On: 08/12/2015 08:26   Ct Head Wo Contrast  08/10/2015  CLINICAL DATA:  Code stroke. Right-sided weakness with right facial droop. EXAM: CT HEAD WITHOUT CONTRAST TECHNIQUE: Contiguous axial images were obtained from the base of the skull through the vertex without intravenous contrast. COMPARISON:  Head CT 07/29/2015, brain MRI 07/30/2015 FINDINGS: No intracranial hemorrhage. Expected evolution with minimal developing hypodensity in the left basal ganglia at site of acute infarct on MRI, no associated edema. No evidence territorial infarct or progressive acute ischemia. Background atrophy and chronic small vessel ischemia is stable in the interim. No cerebral edema, extra-axial fluid collection, mass effect or midline shift. Atherosclerosis again seen of the skullbase vasculature. Tiny mucous retention cyst right maxillary sinus. Calvarium is intact. IMPRESSION: 1. Expected evolution of the left basal ganglia infarct since prior exam. No interval hemorrhage, mass effect or midline shift. 2. Background chronic small vessel ischemia without CT findings of acute territorial infarct. These results were called by telephone at the time of interpretation on 08/10/2015 at 12:11 am to Dr. Janann Colonel , who verbally acknowledged these  results. Electronically Signed   By: Jeb Levering M.D.   On: 08/10/2015 00:12   Mr Virgel Paling Wo Contrast  08/10/2015  CLINICAL DATA:  81 year old female with recent lacunar infarct in the left hemisphere earlier this month status post tPA. Recurrent right side weakness and facial droop. Initial encounter. EXAM: MRI HEAD WITHOUT CONTRAST MRA HEAD WITHOUT CONTRAST TECHNIQUE: Multiplanar, multiecho pulse sequences of the brain and surrounding structures were obtained without intravenous contrast. Angiographic images of the head were obtained using MRA technique without contrast. COMPARISON:  Head CT at 0007 hours today. Brain MRI and head and neck MRA 07/30/2015 FINDINGS: MRI HEAD FINDINGS Linear restricted diffusion extending from the posterior left corona radiata inferiorly toward the putamen and external capsule has faded since 07/30/2015. There is stable curvilinear restricted diffusion in the more anterior corona radiata (series 6, image 18). No new areas of diffusion restriction. Major intracranial vascular flow voids  are stable. No interval hemorrhage. No intracranial mass effect. No new intracranial signal abnormality. No mass effect, evidence of mass lesion, ventriculomegaly, or extra-axial collection. Cervicomedullary junction and pituitary are within normal limits. Stable paranasal sinuses, mastoids, visualized internal auditory structures, orbits soft tissues, and scalp soft tissues. Negative visualized cervical spine. Bone marrow signal remains normal. MRA HEAD FINDINGS Stable posterior circulation, high-grade stenosis of the left vertebral artery V4 segment and asymmetrically decreased flow signal at the left PICA origin. Up to moderate proximal basilar artery stenosis. The basilar artery remains patent. SCA and PCAs are stable, mild to moderate left P1 segment stenosis. Posterior communicating arteries are diminutive or absent. Stable antegrade flow signal in the distal cervical ICAs. Stable ICA  siphons with high-grade bilateral supraclinoid segment irregularity. Both ophthalmic artery origins remain patent. Stable carotid termini. MCA and ACA origins remain patent. Stable visualized bilateral ACA branches. Stable visualized right MCA branches with mild mid M1 stenosis. Left MCA M1 segment and bifurcation irregularity and up to mild stenosis appears stable. Visualized left MCA branches are stable. IMPRESSION: 1. Expected evolution of the recent left MCA white matter infarct. No interval hemorrhage. No associated mass effect. 2. No new intracranial abnormality. 3. Stable severe intracranial atherosclerosis on MRA. Moderate to high-grade stenoses of the distal left vertebral artery, both distal ICA siphons, and the proximal basilar artery. Electronically Signed   By: Genevie Ann M.D.   On: 08/10/2015 14:21   Mr Brain Wo Contrast  08/12/2015  CLINICAL DATA:  Right upper and right lower extremity weakness, asymmetric smile, rightward tongue deviation. Recent left MCA infarct. EXAM: MRI HEAD WITHOUT CONTRAST TECHNIQUE: Multiplanar, multiecho pulse sequences of the brain and surrounding structures were obtained without intravenous contrast. COMPARISON:  08/10/2015 FINDINGS: There is a 1.5 cm focus of restricted diffusion consistent with acute infarct in the left lateral lenticulostriate artery territory extending from the posterior body of the left caudate to the posterior left lentiform nucleus, predominantly involving the posterior corona radiata. This restricted diffusion is new from the MRI of 2 days ago. It is in the same locate as the infarct on the 07/30/2015 brain MRI, although it is more confluent and mildly larger in size. Residual abnormal diffusion signal more anteriorly in the left corona radiata on the prior MRI has further decreased, without residual restricted diffusion in this location. Mild T1 hyperintensity and susceptibility artifact in the left putamen, predominantly posteriorly, may reflect  subacute petechial blood products related to the recent ischemia. There is no parenchymal hematoma. A focus of chronic hemorrhage is again seen in the white matter of the right frontal lobe. There is no mass, midline shift, or extra-axial fluid collection. Moderate generalized cerebral atrophy and moderate chronic small vessel ischemic change in the cerebral white matter are stable. Small, chronic infarcts are again seen in the bilateral basal ganglia, thalami, deep cerebral white matter, and pons. Prior bilateral cataract extraction is noted. Tiny right maxillary sinus mucous retention cysts are again noted. Mastoid air cells are clear. Major intracranial vascular flow voids are preserved. IMPRESSION: 1. 1.5 cm recurrent acute infarct centered in the posterior left corona radiata, new from the MRI of 2 days ago. This is in the same location although is mildly larger than that on the 07/30/2015 MRI. 2. Chronic ischemic changes as above. Electronically Signed   By: Logan Bores M.D.   On: 08/12/2015 09:21   Mr Brain Wo Contrast  08/10/2015  CLINICAL DATA:  81 year old female with recent lacunar infarct in the left  hemisphere earlier this month status post tPA. Recurrent right side weakness and facial droop. Initial encounter. EXAM: MRI HEAD WITHOUT CONTRAST MRA HEAD WITHOUT CONTRAST TECHNIQUE: Multiplanar, multiecho pulse sequences of the brain and surrounding structures were obtained without intravenous contrast. Angiographic images of the head were obtained using MRA technique without contrast. COMPARISON:  Head CT at 0007 hours today. Brain MRI and head and neck MRA 07/30/2015 FINDINGS: MRI HEAD FINDINGS Linear restricted diffusion extending from the posterior left corona radiata inferiorly toward the putamen and external capsule has faded since 07/30/2015. There is stable curvilinear restricted diffusion in the more anterior corona radiata (series 6, image 18). No new areas of diffusion restriction. Major  intracranial vascular flow voids are stable. No interval hemorrhage. No intracranial mass effect. No new intracranial signal abnormality. No mass effect, evidence of mass lesion, ventriculomegaly, or extra-axial collection. Cervicomedullary junction and pituitary are within normal limits. Stable paranasal sinuses, mastoids, visualized internal auditory structures, orbits soft tissues, and scalp soft tissues. Negative visualized cervical spine. Bone marrow signal remains normal. MRA HEAD FINDINGS Stable posterior circulation, high-grade stenosis of the left vertebral artery V4 segment and asymmetrically decreased flow signal at the left PICA origin. Up to moderate proximal basilar artery stenosis. The basilar artery remains patent. SCA and PCAs are stable, mild to moderate left P1 segment stenosis. Posterior communicating arteries are diminutive or absent. Stable antegrade flow signal in the distal cervical ICAs. Stable ICA siphons with high-grade bilateral supraclinoid segment irregularity. Both ophthalmic artery origins remain patent. Stable carotid termini. MCA and ACA origins remain patent. Stable visualized bilateral ACA branches. Stable visualized right MCA branches with mild mid M1 stenosis. Left MCA M1 segment and bifurcation irregularity and up to mild stenosis appears stable. Visualized left MCA branches are stable. IMPRESSION: 1. Expected evolution of the recent left MCA white matter infarct. No interval hemorrhage. No associated mass effect. 2. No new intracranial abnormality. 3. Stable severe intracranial atherosclerosis on MRA. Moderate to high-grade stenoses of the distal left vertebral artery, both distal ICA siphons, and the proximal basilar artery. Electronically Signed   By: Genevie Ann M.D.   On: 08/10/2015 14:21   Dg Chest Port 1 View  08/20/2015  CLINICAL DATA:  81 year old with acute onset of blurred vision, generalized weakness and confusion earlier today while at the nursing home. EXAM:  PORTABLE CHEST 1 VIEW COMPARISON:  08/10/2015 and earlier. FINDINGS: Cardiac silhouette moderately enlarged, unchanged. Linear atelectasis involving the left lung base. Lungs otherwise clear. No localized airspace consolidation. No pleural effusions. No pneumothorax. Normal pulmonary vascularity. IMPRESSION: Stable cardiomegaly. Linear atelectasis involving the left lung base. No acute cardiopulmonary disease otherwise. Electronically Signed   By: Evangeline Dakin M.D.   On: 08/20/2015 17:39   Dg Chest Portable 1 View  08/10/2015  CLINICAL DATA:  Weakness. EXAM: PORTABLE CHEST 1 VIEW COMPARISON:  07/29/2015 FINDINGS: Lung volumes are low. The cardiomediastinal contours are unchanged. Pulmonary vasculature is normal. No consolidation, pleural effusion, or pneumothorax. No acute osseous abnormalities are seen. Remote left rib fracture is unchanged. IMPRESSION: No acute pulmonary process. Electronically Signed   By: Jeb Levering M.D.   On: 08/10/2015 02:27    Microbiology: Recent Results (from the past 240 hour(s))  Urine culture     Status: None   Collection Time: 08/29/15 12:00 PM  Result Value Ref Range Status   Specimen Description URINE, CLEAN CATCH  Final   Special Requests NONE  Final   Culture MULTIPLE SPECIES PRESENT, SUGGEST RECOLLECTION  Final  Report Status 08/31/2015 FINAL  Final  MRSA PCR Screening     Status: None   Collection Time: 08/29/15  6:44 PM  Result Value Ref Range Status   MRSA by PCR NEGATIVE NEGATIVE Final    Comment:        The GeneXpert MRSA Assay (FDA approved for NASAL specimens only), is one component of a comprehensive MRSA colonization surveillance program. It is not intended to diagnose MRSA infection nor to guide or monitor treatment for MRSA infections.      Labs: Basic Metabolic Panel:  Recent Labs Lab 08/29/15 1132 08/31/15 0804  NA 136 135  K 4.1 3.9  CL 101 100*  CO2 23 21*  GLUCOSE 170* 181*  BUN 17 11  CREATININE 0.91 0.99   CALCIUM 9.9 9.5   Liver Function Tests: No results for input(s): AST, ALT, ALKPHOS, BILITOT, PROT, ALBUMIN in the last 168 hours. No results for input(s): LIPASE, AMYLASE in the last 168 hours. No results for input(s): AMMONIA in the last 168 hours. CBC:  Recent Labs Lab 08/29/15 1132 08/31/15 0804  WBC 23.9* 22.6*  NEUTROABS 13.2* 11.6*  HGB 14.4 13.2  HCT 43.1 39.6  MCV 92.5 91.5  PLT PLATELET CLUMPS NOTED ON SMEAR, COUNT APPEARS ADEQUATE 325   Cardiac Enzymes:  Recent Labs Lab 08/29/15 1703 08/29/15 2239 08/30/15 0448  TROPONINI <0.03 <0.03 0.03   BNP: BNP (last 3 results) No results for input(s): BNP in the last 8760 hours.  ProBNP (last 3 results) No results for input(s): PROBNP in the last 8760 hours.  CBG:  Recent Labs Lab 09/01/15 0641 09/01/15 1133 09/01/15 1645 09/01/15 2257 09/02/15 0613  GLUCAP 200* 219* 199* 151* 191*       Signed:  Deuce Paternoster  [facility] 09/02/2015, 10:18 AM

## 2015-09-02 NOTE — Discharge Instructions (Signed)
Hypotension As your heart beats, it forces blood through your arteries. This force is your blood pressure. If your blood pressure is too low for you to go about your normal activities or to support the organs of your body, you have hypotension. Hypotension is also referred to as low blood pressure. When your blood pressure becomes too low, you may not get enough blood to your brain. As a result, you may feel weak, feel lightheaded, or develop a rapid heart rate. In a more severe case, you may faint. CAUSES Various conditions can cause hypotension. These include:  Blood loss.  Dehydration.  Heart or endocrine problems.  Pregnancy.  Severe infection.  Not having a well-balanced diet filled with needed nutrients.  Severe allergic reactions (anaphylaxis). Some medicines, such as blood pressure medicine or water pills (diuretics), may lower your blood pressure below normal. Sometimes taking too much medicine or taking medicine not as directed can cause hypotension. TREATMENT  Hospitalization is sometimes required for hypotension if fluid or blood replacement is needed, if time is needed for medicines to wear off, or if further monitoring is needed. Treatment might include changing your diet, changing your medicines (including medicines aimed at raising your blood pressure), and use of support stockings. HOME CARE INSTRUCTIONS   Drink enough fluids to keep your urine clear or pale yellow.  Take your medicines as directed by your health care provider.  Get up slowly from reclining or sitting positions. This gives your blood pressure a chance to adjust.  Wear support stockings as directed by your health care provider.  Maintain a healthy diet by including nutritious food, such as fruits, vegetables, nuts, whole grains, and lean meats. SEEK MEDICAL CARE IF:  You have vomiting or diarrhea.  You have a fever for more than 2-3 days.  You feel more thirsty than usual.  You feel weak and  tired. SEEK IMMEDIATE MEDICAL CARE IF:   You have chest pain or a fast or irregular heartbeat.  You have a loss of feeling in some part of your body, or you lose movement in your arms or legs.  You have trouble speaking.  You become sweaty or feel lightheaded.  You faint. MAKE SURE YOU:   Understand these instructions.  Will watch your condition.  Will get help right away if you are not doing well or get worse.   This information is not intended to replace advice given to you by your health care provider. Make sure you discuss any questions you have with your health care provider.   Document Released: 10/29/2005 Document Revised: 08/19/2013 Document Reviewed: 05/01/2013 Elsevier Interactive Patient Education 2016 [employer].  Near-Syncope Near-syncope (commonly known as near fainting) is sudden weakness, dizziness, or feeling like you might pass out. During an episode of near-syncope, you may also develop pale skin, have tunnel vision, or feel sick to your stomach (nauseous). Near-syncope may occur when getting up after sitting or while standing for a long time. It is caused by a sudden decrease in blood flow to the brain. This decrease can result from various causes or triggers, most of which are not serious. However, because near-syncope can sometimes be a sign of something serious, a medical evaluation is required. The specific cause is often not determined. HOME CARE INSTRUCTIONS  Monitor your condition for any changes. The following actions may help to alleviate any discomfort you are experiencing:  Have someone stay with you until you feel stable.  Lie down right away and prop your feet  up if you start feeling like you might faint. Breathe deeply and steadily. Wait until all the symptoms have passed. Most of these episodes last only a few minutes. You may feel tired for several hours.   Drink enough fluids to keep your urine clear or pale yellow.   If you are taking  blood pressure or heart medicine, get up slowly when seated or lying down. Take several minutes to sit and then stand. This can reduce dizziness.  Follow up with your health care provider as directed. SEEK IMMEDIATE MEDICAL CARE IF:   You have a severe headache.   You have unusual pain in the chest, abdomen, or back.   You are bleeding from the mouth or rectum, or you have black or tarry stool.   You have an irregular or very fast heartbeat.   You have repeated fainting or have seizure-like jerking during an episode.   You faint when sitting or lying down.   You have confusion.   You have difficulty walking.   You have severe weakness.   You have vision problems.  MAKE SURE YOU:   Understand these instructions.  Will watch your condition.  Will get help right away if you are not doing well or get worse.   This information is not intended to replace advice given to you by your health care provider. Make sure you discuss any questions you have with your health care provider.   Document Released: 10/29/2005 Document Revised: 11/03/2013 Document Reviewed: 04/03/2013 Elsevier Interactive Patient Education [employer].

## 2015-09-02 NOTE — Progress Notes (Signed)
Discharge instructions discussed with patient's daughter who is currently at bedside. Cardiac monitor discontinued, CCMD notified. Patient being discharged to [facility].

## 2015-09-02 NOTE — Patient Outreach (Signed)
[facility]) Care Management  09/02/2015  LINDZEE GOUGE 1923-10-14 [id number]   Assessment-CSW completed outreach to patient's daughter [name] on 09/02/15. HIPPA verifications received. CSW informed [name] that CSW will be completing case closure as EMMI stroke program has been completed. [name] was receptive. CSW will now complete case closure.  Plan-CSW will send case closure letter to PCP. CSW will inform CMA Lurline Del of case closure through in basket message.  Eula Fried, BSW, MSW, [email or web address] Phone: [phone] Fax: [phone]

## 2015-09-02 NOTE — Progress Notes (Signed)
Patient Name: Molly [name] Date of Encounter: 09/02/2015     Principal Problem:   Syncope and collapse Active Problems:   Breast cancer metastasized to bone [name]   Type 2 diabetes mellitus with peripheral neuropathy (HCC)   Heart murmur, systolic   CLL (chronic lymphocytic leukemia) (HCC)   CVA (cerebral vascular accident) ([place])   Intracranial carotid stenosis   Orthostatic hypotension   HLD (hyperlipidemia)   Recurrent UTI   Pressure ulcer   Palliative care encounter   DNR (do not resuscitate)   Weakness generalized    SUBJECTIVE  Blood pressures have been stable a past 24 hours.  She has had no further syncopal episodes.  Rhythm is stable.  No bradycardic episodes.  Pulse is running in the 60s on atenolol 25 mg 3 times a day.  She will be transferring to [facility] facility later today.  CURRENT MEDS . amLODipine  10 mg Oral Daily  . anastrozole  1 mg Oral Daily  . atenolol  25 mg Oral TID  . clopidogrel  75 mg Oral Daily  . enoxaparin (LOVENOX) injection  40 mg Subcutaneous Q24H  . ezetimibe  10 mg Oral q1800  . insulin aspart  0-9 Units Subcutaneous TID WC  . insulin glargine  10 Units Subcutaneous Q1200  . irbesartan  300 mg Oral Daily  . levothyroxine  75 mcg Oral QAC breakfast  . polyethylene glycol  17 g Oral Daily  . sodium chloride  3 mL Intravenous Q12H  . vitamin B-12  500 mcg Oral Q lunch    OBJECTIVE  Filed Vitals:   09/01/15 1600 09/01/15 2108 09/02/15 0413 09/02/15 0913  BP: 163/68 163/63 177/63 165/50  Pulse: 63 62 63 67  Temp: 97.7 F (36.5 C) 97.8 F (36.6 C) 98.1 F (36.7 C) 97.8 F (36.6 C)  TempSrc: Oral Oral Oral Oral  Resp:  20 20 20   Height:      Weight:   215 lb 8 oz (97.75 kg)   SpO2: 99% 98% 96% 97%    Intake/Output Summary (Last 24 hours) at 09/02/15 1056 Last data filed at 09/02/15 0900  Gross per 24 hour  Intake    310 ml  Output      0 ml  Net    310 ml   Filed Weights   08/31/15 0432  09/01/15 0400 09/02/15 0413  Weight: 218 lb 8.6 oz (99.129 kg) 222 lb 4.8 oz (100.835 kg) 215 lb 8 oz (97.75 kg)    PHYSICAL EXAM  General: Pleasant, NAD.  Slurred speech from prior stroke Neuro: Alert and oriented X 3. Moves all extremities spontaneously. Psych: Normal affect. HEENT:  Normal.  Significant droop of mouth on right side  Neck: Bilateral carotid bruits Lungs:  Resp regular and unlabored, CTA. Heart: RRR no s3, s4, grade 2/6 systolic ejection murmur at the base Abdomen: Soft, non-tender, non-distended, BS + x 4.  Extremities: No clubbing, cyanosis or edema. DP/PT/Radials 2+ and equal bilaterally.  Accessory Clinical Findings  CBC  Recent Labs  08/31/15 0804  WBC 22.6*  NEUTROABS 11.6*  HGB 13.2  HCT 39.6  MCV 91.5  PLT 456   Basic Metabolic Panel  Recent Labs  08/31/15 0804  NA 135  K 3.9  CL 100*  CO2 21*  GLUCOSE 181*  BUN 11  CREATININE 0.99  CALCIUM 9.5   Liver Function Tests No results for input(s): AST, ALT, ALKPHOS, BILITOT, PROT, ALBUMIN in the last 72 hours. No  results for input(s): LIPASE, AMYLASE in the last 72 hours. Cardiac Enzymes No results for input(s): CKTOTAL, CKMB, CKMBINDEX, TROPONINI in the last 72 hours. BNP Invalid input(s): POCBNP D-Dimer No results for input(s): DDIMER in the last 72 hours. Hemoglobin A1C No results for input(s): HGBA1C in the last 72 hours. Fasting Lipid Panel No results for input(s): CHOL, HDL, LDLCALC, TRIG, CHOLHDL, LDLDIRECT in the last 72 hours. Thyroid Function Tests No results for input(s): TSH, T4TOTAL, T3FREE, THYROIDAB in the last 72 hours.  Invalid input(s): FREET3  TELE  Normal sinus rhythm  ECG    Radiology/Studies  Dg Chest 2 View  08/29/2015  CLINICAL DATA:  Syncopal episode. EXAM: CHEST  2 VIEW COMPARISON:  08/20/2015 FINDINGS: The heart size and mediastinal contours are within normal limits. Both lungs are clear. No evidence of pneumothorax or pleural effusion . The  visualized skeletal structures are unremarkable. IMPRESSION: No active cardiopulmonary disease. Electronically Signed   By: Earle Gell M.D.   On: 08/29/2015 12:55   Ct Head Wo Contrast  08/20/2015  CLINICAL DATA:  Acute onset of generalized weakness, blurred vision and confusion. Initial encounter. EXAM: CT HEAD WITHOUT CONTRAST TECHNIQUE: Contiguous axial images were obtained from the base of the skull through the vertex without intravenous contrast. COMPARISON:  CT of the head performed 08/14/2015 FINDINGS: There is no evidence of acute infarction, mass lesion, or intra- or extra-axial hemorrhage on CT. The recurrent evolving infarct in the left centrum semiovale is again noted. Prominence of the ventricles and sulci reflects mild cortical volume loss. Scattered periventricular and subcortical white matter change likely reflects small vessel ischemic microangiopathy. Cerebellar atrophy is noted. Small chronic lacunar infarcts are noted at the basal ganglia bilaterally. The brainstem and fourth ventricle are within normal limits. The basal ganglia are unremarkable in appearance. The cerebral hemispheres demonstrate grossly normal gray-white differentiation. No mass effect or midline shift is seen. There is no evidence of fracture; visualized osseous structures are unremarkable in appearance. The orbits are within normal limits. The paranasal sinuses and mastoid air cells are well-aerated. No significant soft tissue abnormalities are seen. IMPRESSION: 1. No acute intracranial pathology seen on CT. 2. Evolving infarct at the left centrum semiovale again noted. 3. Mild cortical volume loss and scattered small vessel ischemic microangiopathy. 4. Small chronic lacunar infarcts at the basal ganglia bilaterally. Electronically Signed   By: Garald Balding M.D.   On: 08/20/2015 17:54   Ct Head Wo Contrast  08/14/2015  CLINICAL DATA:  Follow up stroke.  RIGHT-sided weakness. EXAM: CT HEAD WITHOUT CONTRAST TECHNIQUE:  Contiguous axial images were obtained from the base of the skull through the vertex without intravenous contrast. COMPARISON:  Most recent MR 08/12/2015. Most recent CT head 08/12/2015. FINDINGS: Increasingly well defined hypoattenuation in the LEFT centrum semiovale representing the area of restricted diffusion on the recent MR. No definite new areas of acute infarction. No hemorrhagic transformation. Baseline atrophy with small vessel disease. Calvarium intact. No sinus or acute mastoid disease. Vascular calcification. IMPRESSION: Increasingly well-defined hypoattenuation representing acute infarction LEFT MCA territory. No hemorrhagic transformation. Electronically Signed   By: Staci Righter M.D.   On: 08/14/2015 17:14   Ct Head Wo Contrast  08/12/2015  CLINICAL DATA:  Recent stroke, post tPA with resolution of symptoms, readmitted for new RIGHT-sided weakness, slurred speech and facial droop, hypertension, type II diabetes mellitus, RIGHT breast cancer, hypercholesterolemia EXAM: CT HEAD WITHOUT CONTRAST TECHNIQUE: Contiguous axial images were obtained from the base of the skull through the vertex  without intravenous contrast. COMPARISON:  08/10/2015 FINDINGS: Generalized atrophy. Normal ventricular morphology. No midline shift or mass effect. Extensive small vessel chronic ischemic changes of deep cerebral white matter. No intracranial hemorrhage, mass lesion or evidence acute infarction. Again identified subacute nonhemorrhagic infarct anterior LEFT basal ganglia. No extra-axial fluid collections. Extensive atherosclerotic calcifications of internal carotid and vertebral arteries at skullbase. Bones and sinuses unremarkable. IMPRESSION: Atrophy with small vessel chronic ischemic changes of deep cerebral white matter. Subacute nonhemorrhagic infarct at LEFT basal ganglia unchanged. Extensive atherosclerotic calcification. No new intracranial abnormalities. Electronically Signed   By: Lavonia Dana M.D.   On:  08/12/2015 08:26   Ct Head Wo Contrast  08/10/2015  CLINICAL DATA:  Code stroke. Right-sided weakness with right facial droop. EXAM: CT HEAD WITHOUT CONTRAST TECHNIQUE: Contiguous axial images were obtained from the base of the skull through the vertex without intravenous contrast. COMPARISON:  Head CT 07/29/2015, brain MRI 07/30/2015 FINDINGS: No intracranial hemorrhage. Expected evolution with minimal developing hypodensity in the left basal ganglia at site of acute infarct on MRI, no associated edema. No evidence territorial infarct or progressive acute ischemia. Background atrophy and chronic small vessel ischemia is stable in the interim. No cerebral edema, extra-axial fluid collection, mass effect or midline shift. Atherosclerosis again seen of the skullbase vasculature. Tiny mucous retention cyst right maxillary sinus. Calvarium is intact. IMPRESSION: 1. Expected evolution of the left basal ganglia infarct since prior exam. No interval hemorrhage, mass effect or midline shift. 2. Background chronic small vessel ischemia without CT findings of acute territorial infarct. These results were called by telephone at the time of interpretation on 08/10/2015 at 12:11 am to Dr. Janann Colonel , who verbally acknowledged these results. Electronically Signed   By: Jeb Levering M.D.   On: 08/10/2015 00:12   Mr Virgel Paling Wo Contrast  08/10/2015  CLINICAL DATA:  81 year old female with recent lacunar infarct in the left hemisphere earlier this month status post tPA. Recurrent right side weakness and facial droop. Initial encounter. EXAM: MRI HEAD WITHOUT CONTRAST MRA HEAD WITHOUT CONTRAST TECHNIQUE: Multiplanar, multiecho pulse sequences of the brain and surrounding structures were obtained without intravenous contrast. Angiographic images of the head were obtained using MRA technique without contrast. COMPARISON:  Head CT at 0007 hours today. Brain MRI and head and neck MRA 07/30/2015 FINDINGS: MRI HEAD FINDINGS Linear  restricted diffusion extending from the posterior left corona radiata inferiorly toward the putamen and external capsule has faded since 07/30/2015. There is stable curvilinear restricted diffusion in the more anterior corona radiata (series 6, image 18). No new areas of diffusion restriction. Major intracranial vascular flow voids are stable. No interval hemorrhage. No intracranial mass effect. No new intracranial signal abnormality. No mass effect, evidence of mass lesion, ventriculomegaly, or extra-axial collection. Cervicomedullary junction and pituitary are within normal limits. Stable paranasal sinuses, mastoids, visualized internal auditory structures, orbits soft tissues, and scalp soft tissues. Negative visualized cervical spine. Bone marrow signal remains normal. MRA HEAD FINDINGS Stable posterior circulation, high-grade stenosis of the left vertebral artery V4 segment and asymmetrically decreased flow signal at the left PICA origin. Up to moderate proximal basilar artery stenosis. The basilar artery remains patent. SCA and PCAs are stable, mild to moderate left P1 segment stenosis. Posterior communicating arteries are diminutive or absent. Stable antegrade flow signal in the distal cervical ICAs. Stable ICA siphons with high-grade bilateral supraclinoid segment irregularity. Both ophthalmic artery origins remain patent. Stable carotid termini. MCA and ACA origins remain patent. Stable visualized bilateral ACA  branches. Stable visualized right MCA branches with mild mid M1 stenosis. Left MCA M1 segment and bifurcation irregularity and up to mild stenosis appears stable. Visualized left MCA branches are stable. IMPRESSION: 1. Expected evolution of the recent left MCA white matter infarct. No interval hemorrhage. No associated mass effect. 2. No new intracranial abnormality. 3. Stable severe intracranial atherosclerosis on MRA. Moderate to high-grade stenoses of the distal left vertebral artery, both distal  ICA siphons, and the proximal basilar artery. Electronically Signed   By: Genevie Ann M.D.   On: 08/10/2015 14:21   Mr Brain Wo Contrast  08/12/2015  CLINICAL DATA:  Right upper and right lower extremity weakness, asymmetric smile, rightward tongue deviation. Recent left MCA infarct. EXAM: MRI HEAD WITHOUT CONTRAST TECHNIQUE: Multiplanar, multiecho pulse sequences of the brain and surrounding structures were obtained without intravenous contrast. COMPARISON:  08/10/2015 FINDINGS: There is a 1.5 cm focus of restricted diffusion consistent with acute infarct in the left lateral lenticulostriate artery territory extending from the posterior body of the left caudate to the posterior left lentiform nucleus, predominantly involving the posterior corona radiata. This restricted diffusion is new from the MRI of 2 days ago. It is in the same locate as the infarct on the 07/30/2015 brain MRI, although it is more confluent and mildly larger in size. Residual abnormal diffusion signal more anteriorly in the left corona radiata on the prior MRI has further decreased, without residual restricted diffusion in this location. Mild T1 hyperintensity and susceptibility artifact in the left putamen, predominantly posteriorly, may reflect subacute petechial blood products related to the recent ischemia. There is no parenchymal hematoma. A focus of chronic hemorrhage is again seen in the white matter of the right frontal lobe. There is no mass, midline shift, or extra-axial fluid collection. Moderate generalized cerebral atrophy and moderate chronic small vessel ischemic change in the cerebral white matter are stable. Small, chronic infarcts are again seen in the bilateral basal ganglia, thalami, deep cerebral white matter, and pons. Prior bilateral cataract extraction is noted. Tiny right maxillary sinus mucous retention cysts are again noted. Mastoid air cells are clear. Major intracranial vascular flow voids are preserved. IMPRESSION:  1. 1.5 cm recurrent acute infarct centered in the posterior left corona radiata, new from the MRI of 2 days ago. This is in the same location although is mildly larger than that on the 07/30/2015 MRI. 2. Chronic ischemic changes as above. Electronically Signed   By: Logan Bores M.D.   On: 08/12/2015 09:21   Mr Brain Wo Contrast  08/10/2015  CLINICAL DATA:  81 year old female with recent lacunar infarct in the left hemisphere earlier this month status post tPA. Recurrent right side weakness and facial droop. Initial encounter. EXAM: MRI HEAD WITHOUT CONTRAST MRA HEAD WITHOUT CONTRAST TECHNIQUE: Multiplanar, multiecho pulse sequences of the brain and surrounding structures were obtained without intravenous contrast. Angiographic images of the head were obtained using MRA technique without contrast. COMPARISON:  Head CT at 0007 hours today. Brain MRI and head and neck MRA 07/30/2015 FINDINGS: MRI HEAD FINDINGS Linear restricted diffusion extending from the posterior left corona radiata inferiorly toward the putamen and external capsule has faded since 07/30/2015. There is stable curvilinear restricted diffusion in the more anterior corona radiata (series 6, image 18). No new areas of diffusion restriction. Major intracranial vascular flow voids are stable. No interval hemorrhage. No intracranial mass effect. No new intracranial signal abnormality. No mass effect, evidence of mass lesion, ventriculomegaly, or extra-axial collection. Cervicomedullary junction and pituitary  are within normal limits. Stable paranasal sinuses, mastoids, visualized internal auditory structures, orbits soft tissues, and scalp soft tissues. Negative visualized cervical spine. Bone marrow signal remains normal. MRA HEAD FINDINGS Stable posterior circulation, high-grade stenosis of the left vertebral artery V4 segment and asymmetrically decreased flow signal at the left PICA origin. Up to moderate proximal basilar artery stenosis. The  basilar artery remains patent. SCA and PCAs are stable, mild to moderate left P1 segment stenosis. Posterior communicating arteries are diminutive or absent. Stable antegrade flow signal in the distal cervical ICAs. Stable ICA siphons with high-grade bilateral supraclinoid segment irregularity. Both ophthalmic artery origins remain patent. Stable carotid termini. MCA and ACA origins remain patent. Stable visualized bilateral ACA branches. Stable visualized right MCA branches with mild mid M1 stenosis. Left MCA M1 segment and bifurcation irregularity and up to mild stenosis appears stable. Visualized left MCA branches are stable. IMPRESSION: 1. Expected evolution of the recent left MCA white matter infarct. No interval hemorrhage. No associated mass effect. 2. No new intracranial abnormality. 3. Stable severe intracranial atherosclerosis on MRA. Moderate to high-grade stenoses of the distal left vertebral artery, both distal ICA siphons, and the proximal basilar artery. Electronically Signed   By: Genevie Ann M.D.   On: 08/10/2015 14:21   Dg Chest Port 1 View  08/20/2015  CLINICAL DATA:  81 year old with acute onset of blurred vision, generalized weakness and confusion earlier today while at the nursing home. EXAM: PORTABLE CHEST 1 VIEW COMPARISON:  08/10/2015 and earlier. FINDINGS: Cardiac silhouette moderately enlarged, unchanged. Linear atelectasis involving the left lung base. Lungs otherwise clear. No localized airspace consolidation. No pleural effusions. No pneumothorax. Normal pulmonary vascularity. IMPRESSION: Stable cardiomegaly. Linear atelectasis involving the left lung base. No acute cardiopulmonary disease otherwise. Electronically Signed   By: Evangeline Dakin M.D.   On: 08/20/2015 17:39   Dg Chest Portable 1 View  08/10/2015  CLINICAL DATA:  Weakness. EXAM: PORTABLE CHEST 1 VIEW COMPARISON:  07/29/2015 FINDINGS: Lung volumes are low. The cardiomediastinal contours are unchanged. Pulmonary  vasculature is normal. No consolidation, pleural effusion, or pneumothorax. No acute osseous abnormalities are seen. Remote left rib fracture is unchanged. IMPRESSION: No acute pulmonary process. Electronically Signed   By: Jeb Levering M.D.   On: 08/10/2015 02:27    ASSESSMENT AND PLAN 1. Chest pain - ruled out for MI.   2. H/o syncope/pre-syncope - related to hypotension in neurology office. BB reduced due to sinus bradycardia. She is not a candidate for PPM because of multiple comorbidities.  3. Labile HTN with intermittent hypotension - improved on current regimen.  4. Sinus bradycardia -resolved with lower dose of atenolol  5. Aortic stenosis - mild by recent echocardiogram.  Okay for discharge from cardiology standpoint.  She has an appointment to see me in early December which she will keep if her daughter thinks that she can make a trip safely.  Signed, Darlin Coco MD

## 2015-09-02 NOTE — Progress Notes (Signed)
Patient taken out of hospital for discharge via [facility] by EMS. Daughter at bedside.

## 2015-09-02 NOTE — Progress Notes (Signed)
Report given to [facility], Therapist, sports at [employer]. Awaiting transport to take patient to facility

## 2015-09-02 NOTE — Progress Notes (Signed)
Speech Language Pathology Treatment: Dysphagia  Patient Details Name: Molly [name] MRN: [id number] DOB: Oct 14, 1923 Today's Date: 09/02/2015 Time: 7573-2256 SLP Time Calculation (min) (ACUTE ONLY): 8 min  Assessment / Plan / Recommendation Clinical Impression  Pt complained of lower back pain; student RN and SLP repositioned to pt's comfort level. Brief observation with Dys 3 texture and thin liquids as pt just completed breakfast. No oral difficulties with small piece cracker or straw sip thin water. During past sessions, pt demonstrated tongue sweep to remove pocketed food. Continue Dys 3 due to baseline right labial weakness and possible decreased sensation and thin liquids. Discharge ST.   HPI Other Pertinent Information: 81 y.o. female, with right-sided hemiplegia from previous stroke, recent diverticular bleed, recurrent UTIs, diabetes mellitus, and bilateral intracranial carotid stenosis and CVA 07/2015, re-admitted October 2016 with progression of stroke and while in hospital had a second stroke. CXR No active cardiopulmonary disease. BSE 08/10/15 recommended regular and thin and downgraded to Dys 3 9/30. EGD 07/08/14 there was a ring/stricture right at the GE junction that was easily passed and was widely patent.   Pertinent Vitals Pain Assessment: No/denies pain  SLP Plan  All goals met;Discharge SLP treatment due to (comment)    Recommendations Diet recommendations: Dysphagia 3 (mechanical soft);Thin liquid Liquids provided via: Cup;Straw Medication Administration: Whole meds with puree Supervision: Intermittent supervision to cue for compensatory strategies;Patient able to self feed Compensations: Slow rate;Small sips/bites;Check for pocketing Postural Changes and/or Swallow Maneuvers: Seated upright 90 degrees              Oral Care Recommendations: Oral care BID Follow up Recommendations: Skilled Nursing facility Plan: All goals met;Discharge SLP treatment due to  (comment)    GO Functional Assessment Tool Used:  (skilled clinical judgement) Functional Limitations: Swallowing Swallow Current Status (H2091): At least 20 percent but less than 40 percent impaired, limited or restricted Swallow Goal Status [phone]): At least 1 percent but less than 20 percent impaired, limited or restricted Swallow Discharge Status [phone]): At least 1 percent but less than 20 percent impaired, limited or restricted   Houston Siren 09/02/2015, 10:04 AM  Orbie Pyo Colvin Caroli.Ed [employer] [phone]

## 2015-09-06 ENCOUNTER — Ambulatory Visit: Payer: Self-pay | Admitting: Neurology

## 2015-09-06 ENCOUNTER — Encounter: Payer: Self-pay | Admitting: Internal Medicine

## 2015-09-06 ENCOUNTER — Non-Acute Institutional Stay (SKILLED_NURSING_FACILITY): Payer: Medicare Other | Admitting: Internal Medicine

## 2015-09-06 DIAGNOSIS — I951 Orthostatic hypotension: Secondary | ICD-10-CM

## 2015-09-06 DIAGNOSIS — E1142 Type 2 diabetes mellitus with diabetic polyneuropathy: Secondary | ICD-10-CM | POA: Diagnosis not present

## 2015-09-06 DIAGNOSIS — L98422 Non-pressure chronic ulcer of back with fat layer exposed: Secondary | ICD-10-CM | POA: Diagnosis not present

## 2015-09-06 DIAGNOSIS — E7800 Pure hypercholesterolemia, unspecified: Secondary | ICD-10-CM | POA: Diagnosis not present

## 2015-09-06 DIAGNOSIS — R001 Bradycardia, unspecified: Secondary | ICD-10-CM

## 2015-09-06 DIAGNOSIS — N390 Urinary tract infection, site not specified: Secondary | ICD-10-CM

## 2015-09-06 DIAGNOSIS — C50911 Malignant neoplasm of unspecified site of right female breast: Secondary | ICD-10-CM

## 2015-09-06 DIAGNOSIS — I63232 Cerebral infarction due to unspecified occlusion or stenosis of left carotid arteries: Secondary | ICD-10-CM | POA: Diagnosis not present

## 2015-09-06 DIAGNOSIS — C7951 Secondary malignant neoplasm of bone: Secondary | ICD-10-CM

## 2015-09-06 DIAGNOSIS — R55 Syncope and collapse: Secondary | ICD-10-CM | POA: Diagnosis not present

## 2015-09-06 DIAGNOSIS — E785 Hyperlipidemia, unspecified: Secondary | ICD-10-CM | POA: Diagnosis not present

## 2015-09-06 NOTE — Progress Notes (Signed)
MRN: [id number] Name: Molly [name]  Sex: female Age: 81 y.o. DOB: 07-Jan-1923  [place] #: [name] Facility/Room:107 Level Of Care: SNF Provider: Inocencio Homes [name] Emergency Contacts: Extended Emergency Contact Information Primary Emergency Contact: Molly [name] Address: [address]          [place] Phone: [phone] Work Phone: [phone] Mobile Phone: [phone] Relation: Daughter  Code Status:   Allergies: Amlodipine; Contrast media; Hctz; Hydralazine; Metoprolol; Codeine; Demerol; Librium; and Lipitor  Chief Complaint  Patient presents with  . New Admit To SNF    HPI: Patient is 81 y.o. 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 In brief, Molly [name] had a stroke in September 2016 and received TPA. She was readmitted in October with progression of the stroke and while in the hospital had a second stroke. She was found to have bilateral severe carotid stenosis. During the same hospitalization she had a diverticular bleed. She developed very labile blood pressures. Since discharge she has had 2 syncopal episodes at the SNF. She had a third syncopal episode today in the neurology out patient office. After her syncopal episode she developed chest pain. In the ER her white count is 23.9 (this is baseline). Urine is positive for UA. Systolic blood pressure has ranged from 89-187. Pulse rate has ranged from 40-65. Pt was admitted from 10/17-21 to evaluate syncopy and hypotension. Neurology and cardiology were consulted with findings as below. Pt is admitted to SNF for generalized weakness and ongoing orthostatic hypotension. While a t SNF pt will also be followed for breast CA, tx with arimidex, DM2, tx with insulin and HLD,  tx with zetia.   Past Medical History  Diagnosis Date  . Labile hypertension   . Aortic stenosis     a. Mild by echo 07/2015.  Marland Kitchen Hypothyroidism   . Anemia   . Hypercholesterolemia   . Heart murmur   . Type II diabetes mellitus (Molly [name])   . Arthritis     "back; knees, hands" (05/11/2015)  . Breast cancer, right breast ([place])     "cells went into the blood and caused her hip to break" (05/11/2015)  . Stroke (cerebrum) (Molly [name])     a. Stroke 07/2015 - received TPA and placed on DAPT; admit c/b diverticular bleed, strokes felt 2/2 hypoperfusion in setting of low BP with severe intracranial vascular disease.  . Intracranial vascular stenosis     a. severe bilateral intracranial vascular stenosis.  Marland Kitchen History of GI diverticular bleed 07/2015    Past Surgical History  Procedure Laterality Date  . Hemiarthroplasty hip Right     "partial replacement"  . Pelvic and para-aortic lymph node dissection    . Tonsillectomy    . Appendectomy    . Cholecystectomy    . US echocardiography  03/30/2009    EF 55-60%  . Cardiovascular stress test  01/28/2008    EF 74%  . Esophagogastroduodenoscopy N/A 07/08/2014    Procedure: ESOPHAGOGASTRODUODENOSCOPY (EGD);  Surgeon: Winfield Cunas., MD;  Location: [facility];  Service: Endoscopy;  Laterality: N/A;  . Colonoscopy N/A 07/10/2014    Procedure: COLONOSCOPY;  Surgeon: Lear Ng, MD;  Location: [facility];  Service: Endoscopy;  Laterality: N/A;  . Total abdominal hysterectomy      w/BSO  . Dilation and curettage of uterus    .  Breast biopsy Right 2009  . Breast lumpectomy Right 2009  . Fracture surgery    . Cataract extraction w/ intraocular lens  implant, bilateral Bilateral       Medication List       This list is accurate as of: 09/06/15 11:59 PM.  Always use your most recent med list.               ACCU-CHEK SMARTVIEW test strip  Generic drug:  glucose blood     alum & mag hydroxide-simeth 200-200-20 MG/5ML suspension   Commonly known as:  MAALOX/MYLANTA  Take 30 mLs by mouth every 6 (six) hours as needed for indigestion or heartburn (dyspepsia).     amLODipine 10 MG tablet  Commonly known as:  NORVASC  Take 1 tablet (10 mg total) by mouth daily.     anastrozole 1 MG tablet  Commonly known as:  ARIMIDEX  TAKE 1 TABLET BY MOUTH EVERY DAY     atenolol 25 MG tablet  Commonly known as:  TENORMIN  Take 1 tablet (25 mg total) by mouth 3 (three) times daily.     B-12 500 MCG Tabs  Take 500 mcg by mouth daily after lunch.     cyanocobalamin 500 MCG tablet  Take 1 tablet (500 mcg total) by mouth daily with lunch.     CALCIUM + [name] PO  Take 1 tablet by mouth daily after lunch.     clopidogrel 75 MG tablet  Commonly known as:  PLAVIX  Take 1 tablet (75 mg total) by mouth daily.     ezetimibe 10 MG tablet  Commonly known as:  ZETIA  Take 1 tablet (10 mg total) by mouth daily.     FERROCITE 324 (106 FE) MG Tabs  Generic drug:  Ferrous Fumarate  TAKE 1 TABLET (106 MG OF IRON TOTAL) BY MOUTH DAILY.     insulin glargine 100 UNIT/ML injection  Commonly known as:  LANTUS  Inject 0.1 mLs (10 Units total) into the skin daily at 12 noon.     irbesartan 300 MG tablet  Commonly known as:  AVAPRO  Take 1 tablet (300 mg total) by mouth daily.     levothyroxine 75 MCG tablet  Commonly known as:  SYNTHROID, LEVOTHROID  Take 75 mcg by mouth daily before breakfast.     polyethylene glycol packet  Commonly known as:  MIRALAX / GLYCOLAX  Take 17 g by mouth daily.     polyethylene glycol packet  Commonly known as:  MIRALAX / GLYCOLAX  Take 17 g by mouth daily as needed for mild constipation.        No orders of the defined types were placed in this encounter.     There is no immunization history on file for this patient.  Social History  Substance Use Topics  . Smoking status: Never Smoker   . Smokeless tobacco: Never Used  . Alcohol Use: No    Family history is + HTN, MI   Review of  Systems  DATA OBTAINED: from patient, nurse, daughter GENERAL:  no fevers, fatigue, appetite changes SKIN: No itching, rash;  Wound is without pain EYES: No eye pain, redness, discharge EARS: No earache, tinnitus, change in hearing NOSE: No congestion, drainage or bleeding  MOUTH/THROAT: No mouth or tooth pain, No sore throat RESPIRATORY: No cough, wheezing, SOB CARDIAC: No chest pain, palpitations, lower extremity edema  GI: No abdominal pain, No N/V/[name] or constipation, No heartburn or reflux  GU: No dysuria,  frequency or urgency, or incontinence  MUSCULOSKELETAL: No unrelieved bone/joint pain NEUROLOGIC: No headache, or new focal weakness; dizzyness on and of during the day PSYCHIATRIC: pt and daughter are anxious  Filed Vitals:   09/06/15 1330  BP: 150/88  Pulse: 64  Temp: 98.1 F (36.7 C)  Resp: 18    SpO2 Readings from Last 1 Encounters:  09/02/15 97%        Physical Exam  GENERAL APPEARANCE: Alert, conversant,  No acute distress.  SKIN: No diaphoresis rash; sacrum not visualized HEAD: Normocephalic, atraumatic  EYES: Conjunctiva/lids clear. Pupils round, reactive. EOMs intact.  EARS: External exam WNL, canals clear. Hearing grossly normal.  NOSE: No deformity or discharge.  MOUTH/THROAT: Lips w/o lesions  RESPIRATORY: Breathing is even, unlabored. Lung sounds are clear   CARDIOVASCULAR: Heart RRR no murmurs, rubs or gallops. No peripheral edema.   GASTROINTESTINAL: Abdomen is soft, non-tender, not distended w/ normal bowel sounds. GENITOURINARY: Bladder non tender, not distended  MUSCULOSKELETAL: No abnormal joints or musculature NEUROLOGIC:  Cranial nerves 2-12 grossly intact; R side facial and ext weakness  PSYCHIATRIC: Mood and affect with anxiety, no behavioral issues  Patient Active Problem List   Diagnosis Date Noted  . Sinus bradycardia 09/07/2015  . Palliative care encounter 09/01/2015  . DNR (do not resuscitate) 09/01/2015  . Weakness generalized  09/01/2015  . Chronic non-pressure ulcer of sacrum extending to fat level ([place]) 08/30/2015  . Syncope and collapse 08/29/2015  . Orthostatic hypotension 08/29/2015  . Cerebrovascular accident (CVA) due to stenosis of left carotid artery ([place]) 08/29/2015  . Essential hypertension 08/29/2015  . HLD (hyperlipidemia) 08/29/2015  . Orthostasis 08/29/2015  . Recurrent UTI 08/29/2015  . Urinary retention 08/18/2015  . Lower GI bleed   . CVA (cerebral vascular accident) ([place]) 08/10/2015  . Abnormal WBC count   . Shock ([place])   . Malignant essential hypertension   . Intracranial carotid stenosis   . CVA (cerebral infarction) 07/29/2015  . Abdominal pain, acute 06/19/2015  . Lactic acidosis 06/19/2015  . CLL (chronic lymphocytic leukemia) ([place]) 06/19/2015  . Abdominal pain 06/19/2015  . Left flank pain 06/19/2015  . Dehydration with hyponatremia 05/11/2015  . Hypothyroidism 05/11/2015  . Aortic stenosis, mild 09/07/2014  . Diverticulosis of colon without hemorrhage 07/11/2014  . Colon polyps 07/10/2014  . GI bleed 07/07/2014  . Syncope 07/07/2014  . HTN (hypertension) 07/07/2014  . NSTEMI (non-ST elevated myocardial infarction) ([place]) 07/07/2014  . Anemia, blood loss 07/06/2014  . Carotid artery bruit 08/28/2013  . Heart murmur, systolic 72/53/6644  . RBBB 02/16/2013  . Type 2 diabetes mellitus with peripheral neuropathy ([place]) 02/16/2013  . Benign hypertensive heart disease without heart failure 08/02/2011  . Pure hypercholesterolemia 08/02/2011  . Asymptomatic PVCs 08/02/2011  . Breast cancer metastasized to bone ([place]) 08/02/2011    CBC    Component Value Date/Time   WBC 22.6* 08/31/2015 0804   WBC 24.8* 02/07/2015 1432   RBC 4.33 08/31/2015 0804   RBC 4.20 02/07/2015 1432   HGB 13.2 08/31/2015 0804   HGB 12.5 02/07/2015 1432   HCT 39.6 08/31/2015 0804   HCT 38.0 02/07/2015 1432   PLT 325 08/31/2015 0804   PLT 336 02/07/2015 1432   MCV 91.5 08/31/2015 0804   MCV 90.5  02/07/2015 1432   LYMPHSABS 9.7* 08/31/2015 0804   LYMPHSABS 13.2* 02/07/2015 1432   MONOABS 1.1* 08/31/2015 0804   MONOABS 1.0* 02/07/2015 1432   EOSABS 0.2 08/31/2015 0804   EOSABS 0.3 02/07/2015 1432  BASOSABS 0.0 08/31/2015 0804   BASOSABS 0.1 02/07/2015 1432    CMP     Component Value Date/Time   NA 135 08/31/2015 0804   NA 136 09/22/2014 1426   K 3.9 08/31/2015 0804   K 3.8 09/22/2014 1426   CL 100* 08/31/2015 0804   CO2 21* 08/31/2015 0804   CO2 21* 09/22/2014 1426   GLUCOSE 181* 08/31/2015 0804   GLUCOSE 308* 09/22/2014 1426   BUN 11 08/31/2015 0804   BUN 11.4 09/22/2014 1426   CREATININE 0.99 08/31/2015 0804   CREATININE 0.9 09/22/2014 1426   CALCIUM 9.5 08/31/2015 0804   CALCIUM 9.5 09/22/2014 1426   PROT 6.3* 08/10/2015 0030   PROT 6.7 09/22/2014 1426   ALBUMIN 3.4* 08/10/2015 0030   ALBUMIN 3.4* 09/22/2014 1426   AST 24 08/10/2015 0030   AST 14 09/22/2014 1426   ALT 22 08/10/2015 0030   ALT 17 09/22/2014 1426   ALKPHOS 80 08/10/2015 0030   ALKPHOS 90 09/22/2014 1426   BILITOT 0.6 08/10/2015 0030   BILITOT 0.23 09/22/2014 1426   GFRNONAA 49* 08/31/2015 0804   GFRAA 56* 08/31/2015 0804    Lab Results  Component Value Date   HGBA1C 7.8* 07/30/2015     Dg Chest 2 View  08/29/2015  CLINICAL DATA:  Syncopal episode. EXAM: CHEST  2 VIEW COMPARISON:  08/20/2015 FINDINGS: The heart size and mediastinal contours are within normal limits. Both lungs are clear. No evidence of pneumothorax or pleural effusion . The visualized skeletal structures are unremarkable. IMPRESSION: No active cardiopulmonary disease. Electronically Signed   By: Earle Gell M.[name].   On: 08/29/2015 12:55    Not all labs, radiology exams or other studies done during hospitalization come through on my EPIC note; however they are reviewed by me.    Assessment and Plan  Syncope and collapse Likely secondary to labile blood pressures versus ICA stenosis -PT consulted recommended  SNF -Have asked for repeated orthostatics and to have patient OOB SNF - OT/PT as tolerated; frank discussion with pt and daughter about daily life, recurrent stroke risk  Orthostatic hypotension -It seems to be an ongoing issue -Cardiology consultation appreciated, allow for permissive hypertension -Atenolol dose reduced SNF - SPB ranging  150-180 when pt is not dizzy;also pt [name]/o feeling light headed with these BP; Continue amlodipine, irbesartan and atenolol   Cerebrovascular accident (CVA) due to stenosis of left carotid artery (HCC) Pt has B stenosis of ICA; SNF - pt and daughter aware of what this means; cont plavix  Sinus bradycardia Per cardiology, patient is not pacemaker candidate due to multiple comorbidities -Atenolol reduced  Breast cancer metastasized to bone ([place]) SNF - Follow up with oncology at discharge  -Continue Arimidex   Type 2 diabetes mellitus with peripheral neuropathy (HCC) SNF - A1c 7.8; Continue Lantus, insulin sliding scale CBG monitoring  Pure hypercholesterolemia SNF - cont zetia  HLD (hyperlipidemia) SNF - cont zetia  Chronic non-pressure ulcer of sacrum extending to fat level ([place]) Stage 3; SNF - wound care to follow   Recurrent UTI Completed course of antibiotics -Urine culture showed multiple species   Time spent > 60 min/> 50% of time with patient was spent reviewing records, labs, tests and studies, counseling and developing plan of care  Hennie Duos, MD

## 2015-09-07 NOTE — Assessment & Plan Note (Signed)
SNF - cont zetia

## 2015-09-07 NOTE — Assessment & Plan Note (Signed)
Per cardiology, patient is not pacemaker candidate due to multiple comorbidities -Atenolol reduced

## 2015-09-07 NOTE — Assessment & Plan Note (Signed)
Stage 3; SNF - wound care to follow

## 2015-09-07 NOTE — Assessment & Plan Note (Signed)
Completed course of antibiotics -Urine culture showed multiple species

## 2015-09-07 NOTE — Assessment & Plan Note (Signed)
Pt has B stenosis of ICA; SNF - pt and daughter aware of what this means; cont plavix

## 2015-09-07 NOTE — Assessment & Plan Note (Signed)
SNF - Follow up with oncology at discharge  -Continue Arimidex

## 2015-09-07 NOTE — Assessment & Plan Note (Signed)
-  It seems to be an ongoing issue -Cardiology consultation appreciated, allow for permissive hypertension -Atenolol dose reduced SNF - SPB ranging  150-180 when pt is not dizzy;also pt d/o feeling light headed with these BP; Continue amlodipine, irbesartan and atenolol

## 2015-09-07 NOTE — Assessment & Plan Note (Signed)
Likely secondary to labile blood pressures versus ICA stenosis -PT consulted recommended SNF -Have asked for repeated orthostatics and to have patient OOB SNF - OT/PT as tolerated; frank discussion with pt and daughter about daily life, recurrent stroke risk

## 2015-09-07 NOTE — Assessment & Plan Note (Signed)
SNF - A1c 7.8; Continue Lantus, insulin sliding scale CBG monitoring

## 2015-09-08 NOTE — Patient Outreach (Signed)
[facility]) Care Management  09/08/2015  DEJANEE THIBEAUX [birth year] [id number]   Notification from Eula Fried, LCSW to close case as goals met with EMMI Stroke transition.  Thanks, Ronnell [place]. [place], [place] Assistant Phone: [phone] Fax: [phone]

## 2015-09-09 NOTE — Clinical Social Work Placement (Signed)
   CLINICAL SOCIAL WORK PLACEMENT  NOTE  Date:  09/02/2015 Patient Details  Name: Molly [name] MRN: [id number] Date of Birth: [birth year]  Clinical Social Work is seeking post-discharge placement for this patient at the [place] level of care (*CSW will initial, date and re-position this form in  chart as items are completed):  Yes   Patient/family provided with [employer] Work Department's list of facilities offering this level of care within the geographic area requested by the patient (or if unable, by the patient's family).  Yes   Patient/family informed of their freedom to choose among providers that offer the needed level of care, that participate in Medicare, Medicaid or managed care program needed by the patient, have an available bed and are willing to accept the patient.  Yes   Patient/family informed of [facility]'s ownership interest in [facility] and [facility], as well as of the fact that they are under no obligation to receive care at these facilities.  PASRR submitted to EDS on       PASRR number received on       Existing PASRR number confirmed on 08/30/15     FL2 transmitted to all facilities in geographic area requested by pt/family on 08/30/15     FL2 transmitted to all facilities within larger geographic area on       Patient informed that his/her managed care company has contracts with or will negotiate with certain facilities, including the following:   [name]     Yes   Patient/family informed of bed offers received.  Patient chooses bed at [facility]     Physician recommends and patient chooses bed at      Patient to be transferred to [facility] on 09/02/15.  Patient to be transferred to facility by Ambulance [facility])     Patient family notified on 09/02/15 of transfer.  Name of family member notified:  Daughter:  [name]     PHYSICIAN Please sign  FL2, Please sign DNR, Please prepare priority discharge summary, including medications, Please prepare prescriptions     Additional Comment: Patient d/c'd to SNF on 09/02/15 for short term rehab.  Daughter [name] agreeable and pleased with d/c; patient is also agreeable.  Nursing notified to call report and d/c summary sent to facility for review.  DC packet prepared.  No further CSW needs identified.  CSW signing off.     _______________________________________________ Williemae Area, LCSW 09/02/2015  1:50 PM

## 2015-09-12 ENCOUNTER — Non-Acute Institutional Stay (SKILLED_NURSING_FACILITY): Payer: Medicare Other | Admitting: Internal Medicine

## 2015-09-12 DIAGNOSIS — N390 Urinary tract infection, site not specified: Secondary | ICD-10-CM

## 2015-09-12 DIAGNOSIS — R339 Retention of urine, unspecified: Secondary | ICD-10-CM | POA: Diagnosis not present

## 2015-09-12 NOTE — Progress Notes (Signed)
Patient ID: WAYNETTE TOWERS, female   DOB: June 08, 1923, 81 y.o.   MRN: [id number] MRN: [id number] Name: Molly [name]  Sex: female Age: 81 y.o. DOB: [birth year]  [facility] [place] #: [facility] Facility/Room:107 Level Of Care: SNF Provider: Wille Celeste Emergency Contacts: Extended Emergency Contact Information Primary Emergency Contact: Lynita Lombard Address: [place]          Raelene Bott of [place] Phone: [phone] Work Phone: [phone] Mobile Phone: [phone] Relation: Daughter  Code Status:   Allergies: Amlodipine; Contrast media; Hctz; Hydralazine; Metoprolol; Codeine; Demerol; Librium; and Lipitor  Chief complaint-acute visit secondy to urinary retention  HPI: Patient is 81 y.o. 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 Phoebe Sharps neurology office with chest pain after a syncopal episode. In brief, Ms. Broussard had a stroke in September 2016 and received TPA. She was readmitted in October with progression of the stroke and while in the hospital had a second stroke. She was found to have bilateral severe carotid stenosis. During the same hospitalization she had a diverticular bleed. She developed very labile blood pressures. Since discharge she has had 2 syncopal episodes at the SNF. She had a third syncopal episode  in the neurology out patient office. After her syncopal episode she developed chest pain. In the ER her white count is 23.9 (this is baseline). Urine is positive for UA. Systolic blood pressure has ranged from 89-187. Pulse rate has ranged from 40-65. Pt was admitted from 10/17-21 to evaluate syncopy and hypotension. Neurology and cardiology were consulted with findings as below. Pt was admitted to SNF for generalized weakness and ongoing orthostatic hypotension. While a t SNF pt  also  followed  for breast CA, tx with arimidex, DM2, tx with insulin and HLD, tx with zetia.  Patient was found to be in urinary retention today with apparently about 1000 mL of retained urine when she was cathed-she did receive a Foley catheter but apparently this was quite uncomfortable and I am following up on this-she really wants it removed saying it really does hurt.   .  Her vital signs are stable she does have some permissive hypertension with systolics frequently in the 170 area I see ranges from the 130s to 180s.     Past Medical History  Diagnosis Date  . Labile hypertension   . Aortic stenosis     a. Mild by echo 07/2015.  Marland Kitchen Hypothyroidism   . Anemia   . Hypercholesterolemia   . Heart murmur   . Type II diabetes mellitus ([place])   . Arthritis     "back; knees, hands" (05/11/2015)  . Breast cancer, right breast ([place])     "cells went into the blood and caused her hip to break" (05/11/2015)  . Stroke (cerebrum) ([place])     a. Stroke 07/2015 - received TPA and placed on DAPT; admit c/b diverticular bleed, strokes felt 2/2 hypoperfusion in setting of low BP with severe intracranial vascular disease.  . Intracranial vascular stenosis     a. severe bilateral intracranial vascular stenosis.  Marland Kitchen History of GI diverticular bleed 07/2015    Past Surgical History  Procedure Laterality Date  . Hemiarthroplasty hip Right     "partial replacement"  . Pelvic and para-aortic lymph node dissection    . Tonsillectomy    . Appendectomy    . Cholecystectomy    . US echocardiography  03/30/2009    EF 55-60%  . Cardiovascular stress test  01/28/2008    EF 74%  . Esophagogastroduodenoscopy N/A 07/08/2014    Procedure: ESOPHAGOGASTRODUODENOSCOPY (EGD);  Surgeon: Winfield Cunas., MD;  Location: [facility];  Service: Endoscopy;  Laterality: N/A;  . Colonoscopy N/A 07/10/2014    Procedure: COLONOSCOPY;  Surgeon: Lear Ng, MD;  Location: [facility];  Service: Endoscopy;  Laterality: N/A;  .  Total abdominal hysterectomy      w/BSO  . Dilation and curettage of uterus    . Breast biopsy Right 2009  . Breast lumpectomy Right 2009  . Fracture surgery    . Cataract extraction w/ intraocular lens  implant, bilateral Bilateral       Medication List       This list is accurate as of: 09/12/15 11:59 PM.  Always use your most recent med list.               ACCU-CHEK SMARTVIEW test strip  Generic drug:  glucose blood     alum & mag hydroxide-simeth 200-200-20 MG/5ML suspension  Commonly known as:  MAALOX/MYLANTA  Take 30 mLs by mouth every 6 (six) hours as needed for indigestion or heartburn (dyspepsia).     amLODipine 10 MG tablet  Commonly known as:  NORVASC  Take 1 tablet (10 mg total) by mouth daily.     anastrozole 1 MG tablet  Commonly known as:  ARIMIDEX  TAKE 1 TABLET BY MOUTH EVERY DAY     atenolol 25 MG tablet  Commonly known as:  TENORMIN  Take 1 tablet (25 mg total) by mouth 3 (three) times daily.     B-12 500 MCG Tabs  Take 500 mcg by mouth daily after lunch.     cyanocobalamin 500 MCG tablet  Take 1 tablet (500 mcg total) by mouth daily with lunch.     CALCIUM + D PO  Take 1 tablet by mouth daily after lunch.     clopidogrel 75 MG tablet  Commonly known as:  PLAVIX  Take 1 tablet (75 mg total) by mouth daily.     ezetimibe 10 MG tablet  Commonly known as:  ZETIA  Take 1 tablet (10 mg total) by mouth daily.     FERROCITE 324 (106 FE) MG Tabs  Generic drug:  Ferrous Fumarate  TAKE 1 TABLET (106 MG OF IRON TOTAL) BY MOUTH DAILY.     insulin glargine 100 UNIT/ML injection  Commonly known as:  LANTUS  Inject 0.1 mLs (10 Units total) into the skin daily at 12 noon.     irbesartan 300 MG tablet  Commonly known as:  AVAPRO  Take 1 tablet (300 mg total) by mouth daily.     levothyroxine 75 MCG tablet  Commonly known as:  SYNTHROID, LEVOTHROID  Take 75 mcg by mouth daily before breakfast.     polyethylene glycol packet  Commonly known as:   MIRALAX / GLYCOLAX  Take 17 g by mouth daily.     polyethylene glycol packet  Commonly known as:  MIRALAX / GLYCOLAX  Take 17 g by mouth daily as needed for mild constipation.        No orders of the defined types were placed in this encounter.     There is no immunization history on file for this patient.  Social History  Substance Use Topics  . Smoking status: Never Smoker   . Smokeless tobacco: Never Used  . Alcohol Use: No  Family history is + HTN, MI   Review of Systems  DATA OBTAINED: from patient, nurse, daughter GENERAL:  no fevers, fatigue, appetite changes SKIN: No itching, rash;  Wound is without pain EYES: No eye pain, redness, discharge EARS: No earache, tinnitus, change in hearing NOSE: No congestion, drainage or bleeding  MOUTH/THROAT: No mouth or tooth pain, No sore throat RESPIRATORY: No cough, wheezing, SOB CARDIAC: No chest pain, palpitations, lower extremity edema  GI: No abdominal pain, No N/V/D or constipation, No heartburn or reflux  GU: Urinary retention as noted above-she is complaining of some pain with this  MUSCULOSKELETAL: No unrelieved bone/joint pain NEUROLOGIC: No headache, or new focal weakness; dizzyness on and of during the day PSYCHIATRIC: pt anxious at times  There were no vitals filed for this visit.  SpO2 Readings from Last 1 Encounters:  09/02/15 97%        Physical Exam She is afebrile pulse of 62 respirations 18 and recent blood pressures 173/69-182/71-135/78 GENERAL APPEARANCE: Alert, conversant,  No acute distress.  SKIN: No diaphoresis rash; sacrum not visualized HEAD: Normocephalic, atraumatic  EYES: Conjunctiva/lids clear. Pupils round, reactive. EOMs intact.   NOSE: No deformity or discharge.  MOUTH/THROAT: Oropharynx clear mucous membranes moist  RESPIRATORY: Breathing is even, unlabored. Lung sounds are clear   CARDIOVASCULAR: Heart RRR no murmurs, rubs or gallops. No peripheral edema.   GASTROINTESTINAL:  Abdomen is soft, non-tender, not distended w/ normal bowel sounds. GENITOURINARY: Foley catheter is in place I do not see any discharge or vaginal bleeding there is tenderness to palpation of the area but I do not note any distention MUSCULOSKELETAL: No abnormal joints or musculature NEUROLOGIC:  Cranial nerves 2-12 grossly intact; R side facial and ext weakness  PSYCHIATRIC: Mood and affect with anxiety, no behavioral issues  Patient Active Problem List   Diagnosis Date Noted  . Hypokalemia 09/15/2015  . Sinus bradycardia 09/07/2015  . Palliative care encounter 09/01/2015  . DNR (do not resuscitate) 09/01/2015  . Weakness generalized 09/01/2015  . Chronic non-pressure ulcer of sacrum extending to fat level ([place]) 08/30/2015  . Syncope and collapse 08/29/2015  . Orthostatic hypotension 08/29/2015  . Cerebrovascular accident (CVA) due to stenosis of left carotid artery ([place]) 08/29/2015  . Essential hypertension 08/29/2015  . HLD (hyperlipidemia) 08/29/2015  . Orthostasis 08/29/2015  . Recurrent UTI 08/29/2015  . Urinary retention 08/18/2015  . Lower GI bleed   . CVA (cerebral vascular accident) ([place]) 08/10/2015  . Abnormal WBC count   . Shock ([place])   . Malignant essential hypertension   . Intracranial carotid stenosis   . CVA (cerebral infarction) 07/29/2015  . Abdominal pain, acute 06/19/2015  . Lactic acidosis 06/19/2015  . CLL (chronic lymphocytic leukemia) ([place]) 06/19/2015  . Abdominal pain 06/19/2015  . Left flank pain 06/19/2015  . Dehydration with hyponatremia 05/11/2015  . Hypothyroidism 05/11/2015  . Aortic stenosis, mild 09/07/2014  . Diverticulosis of colon without hemorrhage 07/11/2014  . Colon polyps 07/10/2014  . GI bleed 07/07/2014  . Syncope 07/07/2014  . HTN (hypertension) 07/07/2014  . NSTEMI (non-ST elevated myocardial infarction) ([place]) 07/07/2014  . Anemia, blood loss 07/06/2014  . Carotid artery bruit 08/28/2013  . Heart murmur, systolic 09/08/2535  .  RBBB 02/16/2013  . Type 2 diabetes mellitus with peripheral neuropathy ([place]) 02/16/2013  . Benign hypertensive heart disease without heart failure 08/02/2011  . Pure hypercholesterolemia 08/02/2011  . Asymptomatic PVCs 08/02/2011  . Breast cancer metastasized to bone ([place]) 08/02/2011    Labs.  08/29/2015.  Sodium 136 potassium 4.1 BUN 17 creatinine 0.91.  WBC 23.9 hemoglobin 14.4  CBC    Component Value Date/Time   WBC 22.6* 08/31/2015 0804   WBC 24.8* 02/07/2015 1432   RBC 4.33 08/31/2015 0804   RBC 4.20 02/07/2015 1432   HGB 13.2 08/31/2015 0804   HGB 12.5 02/07/2015 1432   HCT 39.6 08/31/2015 0804   HCT 38.0 02/07/2015 1432   PLT 325 08/31/2015 0804   PLT 336 02/07/2015 1432   MCV 91.5 08/31/2015 0804   MCV 90.5 02/07/2015 1432   LYMPHSABS 9.7* 08/31/2015 0804   LYMPHSABS 13.2* 02/07/2015 1432   MONOABS 1.1* 08/31/2015 0804   MONOABS 1.0* 02/07/2015 1432   EOSABS 0.2 08/31/2015 0804   EOSABS 0.3 02/07/2015 1432   BASOSABS 0.0 08/31/2015 0804   BASOSABS 0.1 02/07/2015 1432    CMP     Component Value Date/Time   NA 135 08/31/2015 0804   NA 136 09/22/2014 1426   K 3.9 08/31/2015 0804   K 3.8 09/22/2014 1426   CL 100* 08/31/2015 0804   CO2 21* 08/31/2015 0804   CO2 21* 09/22/2014 1426   GLUCOSE 181* 08/31/2015 0804   GLUCOSE 308* 09/22/2014 1426   BUN 11 08/31/2015 0804   BUN 11.4 09/22/2014 1426   CREATININE 0.99 08/31/2015 0804   CREATININE 0.9 09/22/2014 1426   CALCIUM 9.5 08/31/2015 0804   CALCIUM 9.5 09/22/2014 1426   PROT 6.3* 08/10/2015 0030   PROT 6.7 09/22/2014 1426   ALBUMIN 3.4* 08/10/2015 0030   ALBUMIN 3.4* 09/22/2014 1426   AST 24 08/10/2015 0030   AST 14 09/22/2014 1426   ALT 22 08/10/2015 0030   ALT 17 09/22/2014 1426   ALKPHOS 80 08/10/2015 0030   ALKPHOS 90 09/22/2014 1426   BILITOT 0.6 08/10/2015 0030   BILITOT 0.23 09/22/2014 1426   GFRNONAA 49* 08/31/2015 0804   GFRAA 56* 08/31/2015 0804    Lab Results  Component Value  Date   HGBA1C 7.8* 07/30/2015     Dg Chest 2 View  08/29/2015  CLINICAL DATA:  Syncopal episode. EXAM: CHEST  2 VIEW COMPARISON:  08/20/2015 FINDINGS: The heart size and mediastinal contours are within normal limits. Both lungs are clear. No evidence of pneumothorax or pleural effusion . The visualized skeletal structures are unremarkable. IMPRESSION: No active cardiopulmonary disease. Electronically Signed   By: Earle Gell M.D.   On: 08/29/2015 12:55    Not all labs, radiology exams or other studies done during hospitalization come through on my EPIC note; however they are reviewed by me.    Assessment and Plan  Assessment and plan.  #1 urinary retention-patient is not tolerating an indwelling Foley catheter at this time she is having significant discomfort-physical exam is fairly benign but she is quite uncomfortable-will discontinue Foley catheter and doing in and out cath every shift for now-also will obtain a UA C&S to check for any possible infection contributing to this.  This was discussed with nursing and in and out cath is not a long-term solution-again will do this temporarily and monitor.  Hopefully at some point she will be able to void on her own.  She also has updated lab work pending for tomorrow including a CBC and metabolic panel  [id number]     Azul Brumett C,

## 2015-09-15 ENCOUNTER — Encounter: Payer: Self-pay | Admitting: Internal Medicine

## 2015-09-15 ENCOUNTER — Non-Acute Institutional Stay (SKILLED_NURSING_FACILITY): Payer: Medicare Other | Admitting: Internal Medicine

## 2015-09-15 DIAGNOSIS — N390 Urinary tract infection, site not specified: Secondary | ICD-10-CM

## 2015-09-15 DIAGNOSIS — E876 Hypokalemia: Secondary | ICD-10-CM

## 2015-09-15 DIAGNOSIS — R339 Retention of urine, unspecified: Secondary | ICD-10-CM

## 2015-09-15 NOTE — Progress Notes (Signed)
Patient ID: Molly [name], female   DOB: [birth year], 81 y.o.   MRN: [id number] MRN: [id number] Name: Molly [name]  Sex: female Age: 81 y.o. DOB: 1923-06-24  [place] #: Molly [name] Facility/Room:107 Level Of Care: SNF Provider: Wille Celeste Emergency Contacts: Extended Emergency Contact Information Primary Emergency Contact: Molly [name] Address: [place]          [name] Phone: [phone] Work Phone: [phone] Mobile Phone: [phone] Relation: Daughter  Code Status:   Allergies: Amlodipine; Contrast media; Hctz; Hydralazine; Metoprolol; Codeine; Demerol; Librium; and Lipitor  Chief Complaint  Patient presents with  . Acute Visit   follow-up urinary retention-UTI-hypokalemia  HPI: Patient is 81 y.o. female with right-sided hemiplegia from previous stroke, recent diverticular bleed, recurrent UTIs, diabetes mellitus, and bilateral intracranial carotid stenosis--- with recent history CVA.  She recently developed urinary retention and a chronic indwelling Foley catheter was attempted but patient did not tolerate this she had significant pain and we have been doing in and out cath-however per nursing today she is voiding quite freely and regularly and I suspect we can discontinue the in and out past and monitor her urine output which is encouraging.  Lab work done on November 1 shows a low potassium however 3.0-this will have to be supplemented clinically she appears to be stable.  We also did obtain a urine culture which is come back positive for UTI--Raoultella planticola --this is sensitive to Cipro.  Currently D she does not complain of dysuria.      Past Medical History  Diagnosis Date  . Labile hypertension   . Aortic stenosis     a. Mild by echo 07/2015.  Marland Kitchen Hypothyroidism   . Anemia   . Hypercholesterolemia   . Heart murmur   . Type II diabetes mellitus ([place])   . Arthritis     "back; knees, hands"  (05/11/2015)  . Breast cancer, right breast ([place])     "cells went into the blood and caused her hip to break" (05/11/2015)  . Stroke (cerebrum) ([place])     a. Stroke 07/2015 - received TPA and placed on DAPT; admit c/b diverticular bleed, strokes felt 2/2 hypoperfusion in setting of low BP with severe intracranial vascular disease.  . Intracranial vascular stenosis     a. severe bilateral intracranial vascular stenosis.  Marland Kitchen History of GI diverticular bleed 07/2015    Past Surgical History  Procedure Laterality Date  . Hemiarthroplasty hip Right     "partial replacement"  . Pelvic and para-aortic lymph node dissection    . Tonsillectomy    . Appendectomy    . Cholecystectomy    . US echocardiography  03/30/2009    EF 55-60%  . Cardiovascular stress test  01/28/2008    EF 74%  . Esophagogastroduodenoscopy N/A 07/08/2014    Procedure: ESOPHAGOGASTRODUODENOSCOPY (EGD);  Surgeon: Winfield Cunas., MD;  Location: [facility];  Service: Endoscopy;  Laterality: N/A;  . Colonoscopy N/A 07/10/2014    Procedure: COLONOSCOPY;  Surgeon: Lear Ng, MD;  Location: [facility];  Service: Endoscopy;  Laterality: N/A;  . Total abdominal hysterectomy      w/BSO  . Dilation and curettage of uterus    . Breast biopsy Right 2009  . Breast lumpectomy Right 2009  . Fracture surgery    . Cataract extraction w/ intraocular lens  implant, bilateral Bilateral       Medication List       This list is  accurate as of: 09/15/15  1:45 PM.  Always use your most recent med list.               ACCU-CHEK SMARTVIEW test strip  Generic drug:  glucose blood     alum & mag hydroxide-simeth 200-200-20 MG/5ML suspension  Commonly known as:  MAALOX/MYLANTA  Take 30 mLs by mouth every 6 (six) hours as needed for indigestion or heartburn (dyspepsia).     amLODipine 10 MG tablet  Commonly known as:  NORVASC  Take 1 tablet (10 mg total) by mouth daily.     anastrozole 1 MG tablet  Commonly known as:   ARIMIDEX  TAKE 1 TABLET BY MOUTH EVERY DAY     atenolol 25 MG tablet  Commonly known as:  TENORMIN  Take 1 tablet (25 mg total) by mouth 3 (three) times daily.     B-12 500 MCG Tabs  Take 500 mcg by mouth daily after lunch.     cyanocobalamin 500 MCG tablet  Take 1 tablet (500 mcg total) by mouth daily with lunch.     CALCIUM + D PO  Take 1 tablet by mouth daily after lunch.     clopidogrel 75 MG tablet  Commonly known as:  PLAVIX  Take 1 tablet (75 mg total) by mouth daily.     ezetimibe 10 MG tablet  Commonly known as:  ZETIA  Take 1 tablet (10 mg total) by mouth daily.     FERROCITE 324 (106 FE) MG Tabs  Generic drug:  Ferrous Fumarate  TAKE 1 TABLET (106 MG OF IRON TOTAL) BY MOUTH DAILY.     insulin glargine 100 UNIT/ML injection  Commonly known as:  LANTUS  Inject 0.1 mLs (10 Units total) into the skin daily at 12 noon.     irbesartan 300 MG tablet  Commonly known as:  AVAPRO  Take 1 tablet (300 mg total) by mouth daily.     levothyroxine 75 MCG tablet  Commonly known as:  SYNTHROID, LEVOTHROID  Take 75 mcg by mouth daily before breakfast.     polyethylene glycol packet  Commonly known as:  MIRALAX / GLYCOLAX  Take 17 g by mouth daily.     polyethylene glycol packet  Commonly known as:  MIRALAX / GLYCOLAX  Take 17 g by mouth daily as needed for mild constipation.        No orders of the defined types were placed in this encounter.     There is no immunization history on file for this patient.  Social History  Substance Use Topics  . Smoking status: Never Smoker   . Smokeless tobacco: Never Used  . Alcohol Use: No    Family history is + HTN, MI   Review of Systems  DATA OBTAINED: from patient, nurse, daughter GENERAL:  no fevers, fatigue, appetite changes SKIN: No itching, rash;  Wound is without pain EYES: No eye pain, redness, discharge EARS: No earache, tinnitus, change in hearing NOSE: No congestion, drainage or bleeding  MOUTH/THROAT:  No mouth or tooth pain, No sore throat RESPIRATORY: No cough, wheezing, SOB CARDIAC: No chest pain, palpitations, lower extremity edema  GI: No abdominal pain, No N/V/D or constipation, No heartburn or reflux  GU: Does not complaining overtly of dysuria but has had urinary retention which apparently has improved some-urine culture has grown out positive  MUSCULOSKELETAL: No unrelieved bone/joint pain NEUROLOGIC: No headache, or new focal weakness; dizzyness on and of during the day PSYCHIATRIC: pt and daughter  are anxious  Filed Vitals:   09/15/15 1328  BP: 150/84  Pulse: 80  Temp: 97 F (36.1 C)  Resp: 18    SpO2 Readings from Last 1 Encounters:  09/02/15 97%        Physical Exam  GENERAL APPEARANCE: Alert, conversant,  No acute distress.  SKIN: No diaphoresis rash; sacrum not visualized HEAD: Normocephalic, atraumatic  EYES: Conjunctiva/lids clear. Pupils round, reactive. EOMs intact.  EARS: External exam WNL, canals clear. Hearing grossly normal.  NOSE: No deformity or discharge.  MOUTH/THROAT: Lips w/o lesions  RESPIRATORY: Breathing is even, unlabored. Lung sounds are clear   CARDIOVASCULAR: Heart RRR no murmurs, rubs or gallops. Mild  peripheral edema.   GASTROINTESTINAL: Abdomen is soft, non-tender, not distended w/ normal bowel sounds. GENITOURINARY: Some mild suprapubic tenderness to palpation 9 do not note any distention MUSCULOSKELETAL: No abnormal joints or musculature--right side weakness NEUROLOGIC:  Cranial nerves 2-12 grossly intact; R side facial and ext weakness  PSYCHIATRIC: Mood and affect with anxiety, no behavioral issues  Patient Active Problem List   Diagnosis Date Noted  . Hypokalemia 09/15/2015  . Sinus bradycardia 09/07/2015  . Palliative care encounter 09/01/2015  . DNR (do not resuscitate) 09/01/2015  . Weakness generalized 09/01/2015  . Chronic non-pressure ulcer of sacrum extending to fat level ([place]) 08/30/2015  . Syncope and collapse  08/29/2015  . Orthostatic hypotension 08/29/2015  . Cerebrovascular accident (CVA) due to stenosis of left carotid artery ([place]) 08/29/2015  . Essential hypertension 08/29/2015  . HLD (hyperlipidemia) 08/29/2015  . Orthostasis 08/29/2015  . Recurrent UTI 08/29/2015  . Urinary retention 08/18/2015  . Lower GI bleed   . CVA (cerebral vascular accident) ([place]) 08/10/2015  . Abnormal WBC count   . Shock ([place])   . Malignant essential hypertension   . Intracranial carotid stenosis   . CVA (cerebral infarction) 07/29/2015  . Abdominal pain, acute 06/19/2015  . Lactic acidosis 06/19/2015  . CLL (chronic lymphocytic leukemia) ([place]) 06/19/2015  . Abdominal pain 06/19/2015  . Left flank pain 06/19/2015  . Dehydration with hyponatremia 05/11/2015  . Hypothyroidism 05/11/2015  . Aortic stenosis, mild 09/07/2014  . Diverticulosis of colon without hemorrhage 07/11/2014  . Colon polyps 07/10/2014  . GI bleed 07/07/2014  . Syncope 07/07/2014  . HTN (hypertension) 07/07/2014  . NSTEMI (non-ST elevated myocardial infarction) ([place]) 07/07/2014  . Anemia, blood loss 07/06/2014  . Carotid artery bruit 08/28/2013  . Heart murmur, systolic 49/67/5916  . RBBB 02/16/2013  . Type 2 diabetes mellitus with peripheral neuropathy ([place]) 02/16/2013  . Benign hypertensive heart disease without heart failure 08/02/2011  . Pure hypercholesterolemia 08/02/2011  . Asymptomatic PVCs 08/02/2011  . Breast cancer metastasized to bone ([place]) 08/02/2011    Labs.  09/13/2015.  Sodium 137 potassium 3 BUN 18 creatinine 0.8.  WBC 19.4 hemoglobin 12.4 platelets 223  CBC    Component Value Date/Time   WBC 22.6* 08/31/2015 0804   WBC 24.8* 02/07/2015 1432   RBC 4.33 08/31/2015 0804   RBC 4.20 02/07/2015 1432   HGB 13.2 08/31/2015 0804   HGB 12.5 02/07/2015 1432   HCT 39.6 08/31/2015 0804   HCT 38.0 02/07/2015 1432   PLT 325 08/31/2015 0804   PLT 336 02/07/2015 1432   MCV 91.5 08/31/2015 0804   MCV 90.5  02/07/2015 1432   LYMPHSABS 9.7* 08/31/2015 0804   LYMPHSABS 13.2* 02/07/2015 1432   MONOABS 1.1* 08/31/2015 0804   MONOABS 1.0* 02/07/2015 1432   EOSABS 0.2 08/31/2015 0804   EOSABS 0.3  02/07/2015 1432   BASOSABS 0.0 08/31/2015 0804   BASOSABS 0.1 02/07/2015 1432    CMP     Component Value Date/Time   NA 135 08/31/2015 0804   NA 136 09/22/2014 1426   K 3.9 08/31/2015 0804   K 3.8 09/22/2014 1426   CL 100* 08/31/2015 0804   CO2 21* 08/31/2015 0804   CO2 21* 09/22/2014 1426   GLUCOSE 181* 08/31/2015 0804   GLUCOSE 308* 09/22/2014 1426   BUN 11 08/31/2015 0804   BUN 11.4 09/22/2014 1426   CREATININE 0.99 08/31/2015 0804   CREATININE 0.9 09/22/2014 1426   CALCIUM 9.5 08/31/2015 0804   CALCIUM 9.5 09/22/2014 1426   PROT 6.3* 08/10/2015 0030   PROT 6.7 09/22/2014 1426   ALBUMIN 3.4* 08/10/2015 0030   ALBUMIN 3.4* 09/22/2014 1426   AST 24 08/10/2015 0030   AST 14 09/22/2014 1426   ALT 22 08/10/2015 0030   ALT 17 09/22/2014 1426   ALKPHOS 80 08/10/2015 0030   ALKPHOS 90 09/22/2014 1426   BILITOT 0.6 08/10/2015 0030   BILITOT 0.23 09/22/2014 1426   GFRNONAA 49* 08/31/2015 0804   GFRAA 56* 08/31/2015 0804    Lab Results  Component Value Date   HGBA1C 7.8* 07/30/2015     Dg Chest 2 View  08/29/2015  CLINICAL DATA:  Syncopal episode. EXAM: CHEST  2 VIEW COMPARISON:  08/20/2015 FINDINGS: The heart size and mediastinal contours are within normal limits. Both lungs are clear. No evidence of pneumothorax or pleural effusion . The visualized skeletal structures are unremarkable. IMPRESSION: No active cardiopulmonary disease. Electronically Signed   By: Earle Gell M.D.   On: 08/29/2015 12:55    Not all labs, radiology exams or other studies done during hospitalization come through on my EPIC note; however they are reviewed by me.    Assessment and Plan  #1 hypokalemia-most recent potassium 3.0 will give her 40 mEq of potassium today-and tomorrow a.m. and then reduce to 20  mEq a day updated metabolic panel tomorrow and also first laboratory day next week-clinically she appears to be stable.--Also obtain magnesium level  #2 history UTI as noted above will treat her with Cipro 250 mg twice a day for 7 days-.  #3 history urinary retention-this appears to have significantly improved per nursing she is voiding frequently and freely-this will have to be monitored with output monitoring every shift  [id number].        Marquesha Robideau C

## 2015-09-17 ENCOUNTER — Non-Acute Institutional Stay (SKILLED_NURSING_FACILITY): Payer: Medicare Other | Admitting: Internal Medicine

## 2015-09-17 DIAGNOSIS — R21 Rash and other nonspecific skin eruption: Secondary | ICD-10-CM | POA: Diagnosis not present

## 2015-09-17 NOTE — Progress Notes (Signed)
Patient ID: Molly [name], female   DOB: August 02, 1923, 81 y.o.   MRN: [id number]   This is an acute visit.  Level care skilled.  [place].  Chief complaint acute visit secondary to rash under breasts and back.  History of present illness.  Patient is a pleasant 81 year old female with a history of CVA with right-sided weakness as well as diabetes type 2.  She has developed an erythematous rash under her breasts bilaterally as well as a rash on her back-she does complain of some itching and discomfort with this.  Her vital signs are stable.  Family medical social history reviewed per previous progress note on 09/15/2015.  Medications have been reviewed per MAR.  Review of systems.  Review of systems.  In general denies any fever or chills.  Skin as noted above.  Respiratory does not complain of shortness of breath or cough.  Cardiac does not complain of chest pain.  Physical exam.  General this is a pleasant elderly female in no distress resting B in bed.  Skin-she does have an erythematous fairly confluent rash under her breasts bilaterally-she also has a rash covering most of her back this appears to be more circumscribed macular papular rash appears to have somewhat elevated borders-  Assessment and plan.  Dermatitis unspecified expect there may be a fungal component here Will treat with Nizoral cream twice a day until resolved if no resolution notify provider.  [id number]

## 2015-09-20 ENCOUNTER — Encounter: Payer: Self-pay | Admitting: Internal Medicine

## 2015-09-20 ENCOUNTER — Non-Acute Institutional Stay (SKILLED_NURSING_FACILITY): Payer: Medicare Other | Admitting: Internal Medicine

## 2015-09-20 DIAGNOSIS — E876 Hypokalemia: Secondary | ICD-10-CM | POA: Diagnosis not present

## 2015-09-20 DIAGNOSIS — C9110 Chronic lymphocytic leukemia of B-cell type not having achieved remission: Secondary | ICD-10-CM | POA: Diagnosis not present

## 2015-09-20 NOTE — Assessment & Plan Note (Addendum)
K+ was low 11/3, repleted po and routine KCL 20 meq daily started;  On 11/7 K+ 3.0. In the interim pt was tx for UTI with cipro and on 11/4 MgOxide was started for Mg+ of 1.7; will give 40 meq times 2 today and KCl increased to 40 meq daily. Pt's TSH was 10/18 was 3.527. Will order spot urine K+, plasma renin activity (PRA). Pt with poor prognosis from all comorbidities

## 2015-09-20 NOTE — Assessment & Plan Note (Addendum)
SNF blood draw with WBC 19.4; blood smear c/w CLL; pt has elevated WBC's at least back to 07/2015. Baseline WBC 22-24. Palliative was seen during hospitalization with a < 6 month prognosis from all co-morbidities.

## 2015-09-20 NOTE — Assessment & Plan Note (Signed)
!!/  4 Mg+ was 1.7; Mg Oxide 400 mg daily was started.

## 2015-09-20 NOTE — Progress Notes (Addendum)
MRN: [id number] Name: Molly [name]  Sex: female Age: 81 y.o. DOB: 14-Oct-1923  [place] #:  Facility/Room: Level Of Care: SNF Provider: Inocencio Homes D Emergency Contacts: Extended Emergency Contact Information Primary Emergency Contact: Lynita Lombard Address: [place]          [place] Phone: [phone] Work Phone: [phone] Mobile Phone: [phone] Relation: Daughter  Code Status:   Allergies: Amlodipine; Contrast media; Hctz; Hydralazine; Metoprolol; Codeine; Demerol; Librium; and Lipitor  Chief Complaint  Patient presents with  . Acute Visit    HPI: Patient is 81 y.o. female with HTN, AS, hypothyroid, anemia, DM2, HLD who is being seen acutely for potassium of 3.0. This had occurred prior, with repletion both acutely and now daily. Pt is not having any muscle weakness, palpitations or other sx. Pt also with low Mg+ and very elevated WBC.  Past Medical History  Diagnosis Date  . Labile hypertension   . Aortic stenosis     a. Mild by echo 07/2015.  Marland Kitchen Hypothyroidism   . Anemia   . Hypercholesterolemia   . Heart murmur   . Type II diabetes mellitus ([place])   . Arthritis     "back; knees, hands" (05/11/2015)  . Breast cancer, right breast ([place])     "cells went into the blood and caused her hip to break" (05/11/2015)  . Stroke (cerebrum) ([place])     a. Stroke 07/2015 - received TPA and placed on DAPT; admit c/b diverticular bleed, strokes felt 2/2 hypoperfusion in setting of low BP with severe intracranial vascular disease.  . Intracranial vascular stenosis     a. severe bilateral intracranial vascular stenosis.  Marland Kitchen History of GI diverticular bleed 07/2015    Past Surgical History  Procedure Laterality Date  . Hemiarthroplasty hip Right     "partial replacement"  . Pelvic and para-aortic lymph node dissection    . Tonsillectomy    . Appendectomy    . Cholecystectomy    . US echocardiography  03/30/2009    EF 55-60%   . Cardiovascular stress test  01/28/2008    EF 74%  . Esophagogastroduodenoscopy N/A 07/08/2014    Procedure: ESOPHAGOGASTRODUODENOSCOPY (EGD);  Surgeon: Winfield Cunas., MD;  Location[facility];  Service: Endoscopy;  Laterality: N/A;  . Colonoscopy N/A 07/10/2014    Procedure: COLONOSCOPY;  Surgeon: Lear Ng, MD;  Location: [facility];  Service: Endoscopy;  Laterality: N/A;  . Total abdominal hysterectomy      w/BSO  . Dilation and curettage of uterus    . Breast biopsy Right 2009  . Breast lumpectomy Right 2009  . Fracture surgery    . Cataract extraction w/ intraocular lens  implant, bilateral Bilateral       Medication List       This list is accurate as of: 09/20/15  1:56 PM.  Always use your most recent med list.               ACCU-CHEK SMARTVIEW test strip  Generic drug:  glucose blood     alum & mag hydroxide-simeth 200-200-20 MG/5ML suspension  Commonly known as:  MAALOX/MYLANTA  Take 30 mLs by mouth every 6 (six) hours as needed for indigestion or heartburn (dyspepsia).     amLODipine 10 MG tablet  Commonly known as:  NORVASC  Take 1 tablet (10 mg total) by mouth daily.     anastrozole 1 MG tablet  Commonly known as:  ARIMIDEX  TAKE 1 TABLET  BY MOUTH EVERY DAY     atenolol 25 MG tablet  Commonly known as:  TENORMIN  Take 1 tablet (25 mg total) by mouth 3 (three) times daily.     B-12 500 MCG Tabs  Take 500 mcg by mouth daily after lunch.     cyanocobalamin 500 MCG tablet  Take 1 tablet (500 mcg total) by mouth daily with lunch.     CALCIUM + D PO  Take 1 tablet by mouth daily after lunch.     clopidogrel 75 MG tablet  Commonly known as:  PLAVIX  Take 1 tablet (75 mg total) by mouth daily.     ezetimibe 10 MG tablet  Commonly known as:  ZETIA  Take 1 tablet (10 mg total) by mouth daily.     FERROCITE 324 (106 FE) MG Tabs  Generic drug:  Ferrous Fumarate  TAKE 1 TABLET (106 MG OF IRON TOTAL) BY MOUTH DAILY.     insulin  glargine 100 UNIT/ML injection  Commonly known as:  LANTUS  Inject 0.1 mLs (10 Units total) into the skin daily at 12 noon.     irbesartan 300 MG tablet  Commonly known as:  AVAPRO  Take 1 tablet (300 mg total) by mouth daily.     levothyroxine 75 MCG tablet  Commonly known as:  SYNTHROID, LEVOTHROID  Take 75 mcg by mouth daily before breakfast.     polyethylene glycol packet  Commonly known as:  MIRALAX / GLYCOLAX  Take 17 g by mouth daily.     polyethylene glycol packet  Commonly known as:  MIRALAX / GLYCOLAX  Take 17 g by mouth daily as needed for mild constipation.        No orders of the defined types were placed in this encounter.     There is no immunization history on file for this patient.  Social History  Substance Use Topics  . Smoking status: Never Smoker   . Smokeless tobacco: Never Used  . Alcohol Use: No    Review of Systems  DATA OBTAINED: from patient, nurse GENERAL:  no fevers, fatigue, appetite changes SKIN: No itching, rash HEENT: No complaint RESPIRATORY: No cough, wheezing, SOB CARDIAC: No chest pain, palpitations, lower extremity edema  GI: No abdominal pain, No N/V/D or constipation, No heartburn or reflux  GU: No dysuria, frequency or urgency, or incontinence  MUSCULOSKELETAL: No unrelieved bone/joint pain NEUROLOGIC: No headache, dizziness  PSYCHIATRIC: No overt anxiety or sadness  Filed Vitals:   09/20/15 1311  BP: 154/74  Pulse: 71  Temp: 97 F (36.1 C)  Resp: 18    Physical Exam  GENERAL APPEARANCE: Alert, conversant, No acute distress  SKIN: No diaphoresis rash HEENT: Unremarkable RESPIRATORY: Breathing is even, unlabored. Lung sounds are clear   CARDIOVASCULAR: Heart RRR no murmurs, rubs or gallops. No peripheral edema  GASTROINTESTINAL: Abdomen is soft, non-tender, not distended w/ normal bowel sounds.  GENITOURINARY: Bladder non tender, not distended  MUSCULOSKELETAL: No abnormal joints or musculature NEUROLOGIC:  Cranial nerves 2-12 grossly intact; R side weakness PSYCHIATRIC: unhappy affect, no behavioral issues  Patient Active Problem List   Diagnosis Date Noted  . Hypomagnesemia 09/20/2015  . Hypokalemia 09/15/2015  . Sinus bradycardia 09/07/2015  . Palliative care encounter 09/01/2015  . DNR (do not resuscitate) 09/01/2015  . Weakness generalized 09/01/2015  . Chronic non-pressure ulcer of sacrum extending to fat level ([place]) 08/30/2015  . Syncope and collapse 08/29/2015  . Orthostatic hypotension 08/29/2015  . Cerebrovascular accident (CVA) due  to stenosis of left carotid artery ([place]) 08/29/2015  . Essential hypertension 08/29/2015  . HLD (hyperlipidemia) 08/29/2015  . Orthostasis 08/29/2015  . Recurrent UTI 08/29/2015  . Urinary retention 08/18/2015  . Lower GI bleed   . CVA (cerebral vascular accident) ([place]) 08/10/2015  . Abnormal WBC count   . Shock ([place])   . Malignant essential hypertension   . Intracranial carotid stenosis   . CVA (cerebral infarction) 07/29/2015  . Abdominal pain, acute 06/19/2015  . Lactic acidosis 06/19/2015  . CLL (chronic lymphocytic leukemia) ([place]) 06/19/2015  . Abdominal pain 06/19/2015  . Left flank pain 06/19/2015  . Dehydration with hyponatremia 05/11/2015  . Hypothyroidism 05/11/2015  . Aortic stenosis, mild 09/07/2014  . Diverticulosis of colon without hemorrhage 07/11/2014  . Colon polyps 07/10/2014  . GI bleed 07/07/2014  . Syncope 07/07/2014  . HTN (hypertension) 07/07/2014  . NSTEMI (non-ST elevated myocardial infarction) ([place]) 07/07/2014  . Anemia, blood loss 07/06/2014  . Carotid artery bruit 08/28/2013  . Heart murmur, systolic 05/05/7627  . RBBB 02/16/2013  . Type 2 diabetes mellitus with peripheral neuropathy ([place]) 02/16/2013  . Benign hypertensive heart disease without heart failure 08/02/2011  . Pure hypercholesterolemia 08/02/2011  . Asymptomatic PVCs 08/02/2011  . Breast cancer metastasized to bone ([place]) 08/02/2011    CBC     Component Value Date/Time   WBC 22.6* 08/31/2015 0804   WBC 24.8* 02/07/2015 1432   RBC 4.33 08/31/2015 0804   RBC 4.20 02/07/2015 1432   HGB 13.2 08/31/2015 0804   HGB 12.5 02/07/2015 1432   HCT 39.6 08/31/2015 0804   HCT 38.0 02/07/2015 1432   PLT 325 08/31/2015 0804   PLT 336 02/07/2015 1432   MCV 91.5 08/31/2015 0804   MCV 90.5 02/07/2015 1432   LYMPHSABS 9.7* 08/31/2015 0804   LYMPHSABS 13.2* 02/07/2015 1432   MONOABS 1.1* 08/31/2015 0804   MONOABS 1.0* 02/07/2015 1432   EOSABS 0.2 08/31/2015 0804   EOSABS 0.3 02/07/2015 1432   BASOSABS 0.0 08/31/2015 0804   BASOSABS 0.1 02/07/2015 1432    CMP     Component Value Date/Time   NA 135 08/31/2015 0804   NA 136 09/22/2014 1426   K 3.9 08/31/2015 0804   K 3.8 09/22/2014 1426   CL 100* 08/31/2015 0804   CO2 21* 08/31/2015 0804   CO2 21* 09/22/2014 1426   GLUCOSE 181* 08/31/2015 0804   GLUCOSE 308* 09/22/2014 1426   BUN 11 08/31/2015 0804   BUN 11.4 09/22/2014 1426   CREATININE 0.99 08/31/2015 0804   CREATININE 0.9 09/22/2014 1426   CALCIUM 9.5 08/31/2015 0804   CALCIUM 9.5 09/22/2014 1426   PROT 6.3* 08/10/2015 0030   PROT 6.7 09/22/2014 1426   ALBUMIN 3.4* 08/10/2015 0030   ALBUMIN 3.4* 09/22/2014 1426   AST 24 08/10/2015 0030   AST 14 09/22/2014 1426   ALT 22 08/10/2015 0030   ALT 17 09/22/2014 1426   ALKPHOS 80 08/10/2015 0030   ALKPHOS 90 09/22/2014 1426   BILITOT 0.6 08/10/2015 0030   BILITOT 0.23 09/22/2014 1426   GFRNONAA 49* 08/31/2015 0804   GFRAA 56* 08/31/2015 0804    Assessment and Plan  Hypokalemia K+ was low 11/3, repleted po and routine KCL 20 meq daily started;  On 11/7 K+ 3.0. In the interim pt was tx for UTI with cipro and on 11/4 MgOxide was started for Mg+ of 1.7; will give 40 meq times 2 today and KCl increased to 40 meq daily. Pt's TSH was 10/18 was  3.527. Will order spot urine K+, plasma renin activity (PRA). Pt with poor prognosis from all comorbidities  Hypomagnesemia !!/4 Mg+  was 1.7; Mg Oxide 400 mg daily was started.  CLL (chronic lymphocytic leukemia) ([place]) SNF blood draw with WBC 19.4; blood smear c/w CLL; pt has elevated WBC's at least back to 07/2015. Baseline WBC 22-24. Palliative was seen during hospitalization with a < 6 month prognosis from all co-morbidities.  Time spent > 35 min;> 50% of time with patient was spent reviewing records, labs, tests and studies, counseling and developing plan of care  Hennie Duos, MD

## 2015-09-23 ENCOUNTER — Ambulatory Visit: Payer: Medicare Other | Admitting: Cardiology

## 2015-09-27 ENCOUNTER — Non-Acute Institutional Stay (SKILLED_NURSING_FACILITY): Payer: Medicare Other | Admitting: Internal Medicine

## 2015-09-27 ENCOUNTER — Encounter: Payer: Self-pay | Admitting: Internal Medicine

## 2015-09-27 DIAGNOSIS — L98422 Non-pressure chronic ulcer of back with fat layer exposed: Secondary | ICD-10-CM

## 2015-09-27 DIAGNOSIS — I951 Orthostatic hypotension: Secondary | ICD-10-CM

## 2015-09-27 DIAGNOSIS — E1142 Type 2 diabetes mellitus with diabetic polyneuropathy: Secondary | ICD-10-CM | POA: Diagnosis not present

## 2015-09-27 DIAGNOSIS — E876 Hypokalemia: Secondary | ICD-10-CM | POA: Diagnosis not present

## 2015-09-27 DIAGNOSIS — C7951 Secondary malignant neoplasm of bone: Secondary | ICD-10-CM

## 2015-09-27 DIAGNOSIS — N390 Urinary tract infection, site not specified: Secondary | ICD-10-CM | POA: Diagnosis not present

## 2015-09-27 DIAGNOSIS — C50911 Malignant neoplasm of unspecified site of right female breast: Secondary | ICD-10-CM | POA: Diagnosis not present

## 2015-09-27 DIAGNOSIS — R55 Syncope and collapse: Secondary | ICD-10-CM

## 2015-09-27 DIAGNOSIS — R001 Bradycardia, unspecified: Secondary | ICD-10-CM | POA: Diagnosis not present

## 2015-09-27 DIAGNOSIS — I63232 Cerebral infarction due to unspecified occlusion or stenosis of left carotid arteries: Secondary | ICD-10-CM

## 2015-09-27 NOTE — Progress Notes (Signed)
MRN: [id number] Name: Molly [name]  Sex: female Age: 81 y.o. DOB: [birth year]  [place] #: [facility] Facility/Room:107 Level Of Care: SNF Provider: Inocencio Homes D Emergency Contacts: Extended Emergency Contact Information Primary Emergency Contact: Lynita Lombard Address: [place]          [place] Phone: [phone] Work Phone: [phone] Mobile Phone: [phone] Relation: Daughter  Code Status:   Allergies: Amlodipine; Contrast media; Hctz; Hydralazine; Metoprolol; Codeine; Demerol; Librium; and Lipitor  Chief Complaint  Patient presents with  . Discharge Note    HPI: Patient is 81 y.o. 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 Phoebe Sharps neurology office with chest pain after a syncopal episode. In brief, [name] had a stroke in September 2016 and received TPA. She was readmitted in October with progression of the stroke and while in the hospital had a second stroke. She was found to have bilateral severe carotid stenosis. During the same hospitalization she had a diverticular bleed. She developed very labile blood pressures. Since discharge she has had 2 syncopal episodes at the SNF. She had a third syncopal episode today in the neurology out patient office. After her syncopal episode she developed chest pain. In the ER her white count is 23.9 (this is baseline). Urine is positive for UA. Systolic blood pressure has ranged from 89-187. Pulse rate has ranged from 40-65. Pt was admitted from 10/17-21 to evaluate syncopy and hypotension, seen by neuro and cards, no interventions possible, permissive hypertention. Pt is admitted to SNF for generalized weakness and ongoing orthostatic hypotension to work with OT/PT for that. Pt is now ready to be d/c to home.  Past Medical History   Diagnosis Date  . Labile hypertension   . Aortic stenosis     a. Mild by echo 07/2015.  Marland Kitchen Hypothyroidism   . Anemia   . Hypercholesterolemia   . Heart murmur   . Type II diabetes mellitus ([place])   . Arthritis     "back; knees, hands" (05/11/2015)  . Breast cancer, right breast ([place])     "cells went into the blood and caused her hip to break" (05/11/2015)  . Stroke (cerebrum) ([place])     a. Stroke 07/2015 - received TPA and placed on DAPT; admit c/b diverticular bleed, strokes felt 2/2 hypoperfusion in setting of low BP with severe intracranial vascular disease.  . Intracranial vascular stenosis     a. severe bilateral intracranial vascular stenosis.  Marland Kitchen History of GI diverticular bleed 07/2015    Past Surgical History  Procedure Laterality Date  . Hemiarthroplasty hip Right     "partial replacement"  . Pelvic and para-aortic lymph node dissection    . Tonsillectomy    . Appendectomy    . Cholecystectomy    . US echocardiography  03/30/2009    EF 55-60%  . Cardiovascular stress test  01/28/2008    EF 74%  . Esophagogastroduodenoscopy N/A 07/08/2014    Procedure: ESOPHAGOGASTRODUODENOSCOPY (EGD);  Surgeon: Winfield Cunas., MD;  Location: [facility];  Service: Endoscopy;  Laterality: N/A;  . Colonoscopy N/A 07/10/2014    Procedure: COLONOSCOPY;  Surgeon: Lear Ng, MD;  Location: [facility];  Service: Endoscopy;  Laterality: N/A;  . Total abdominal hysterectomy      w/BSO  . Dilation and curettage of uterus    . Breast biopsy Right 2009  . Breast  lumpectomy Right 2009  . Fracture surgery    . Cataract extraction w/ intraocular lens  implant, bilateral Bilateral       Medication List       This list is accurate as of: 09/27/15  2:56 PM.  Always use your most recent med list.               ACCU-CHEK SMARTVIEW test strip  Generic drug:  glucose blood     alum & mag hydroxide-simeth 200-200-20 MG/5ML suspension  Commonly known as:  MAALOX/MYLANTA  Take 30  mLs by mouth every 6 (six) hours as needed for indigestion or heartburn (dyspepsia).     amLODipine 10 MG tablet  Commonly known as:  NORVASC  Take 1 tablet (10 mg total) by mouth daily.     anastrozole 1 MG tablet  Commonly known as:  ARIMIDEX  TAKE 1 TABLET BY MOUTH EVERY DAY     atenolol 25 MG tablet  Commonly known as:  TENORMIN  Take 1 tablet (25 mg total) by mouth 3 (three) times daily.     CALCIUM + D PO  Take 1 tablet by mouth daily after lunch.     clopidogrel 75 MG tablet  Commonly known as:  PLAVIX  Take 1 tablet (75 mg total) by mouth daily.     cyanocobalamin 500 MCG tablet  Take 1 tablet (500 mcg total) by mouth daily with lunch.     ezetimibe 10 MG tablet  Commonly known as:  ZETIA  Take 1 tablet (10 mg total) by mouth daily.     ferrous sulfate 325 (65 FE) MG tablet  Take 325 mg by mouth daily with breakfast.     insulin glargine 100 UNIT/ML injection  Commonly known as:  LANTUS  Inject 0.1 mLs (10 Units total) into the skin daily at 12 noon.     irbesartan 300 MG tablet  Commonly known as:  AVAPRO  Take 1 tablet (300 mg total) by mouth daily.     levothyroxine 75 MCG tablet  Commonly known as:  SYNTHROID, LEVOTHROID  Take 75 mcg by mouth daily before breakfast.     polyethylene glycol packet  Commonly known as:  MIRALAX / GLYCOLAX  Take 17 g by mouth daily as needed for mild constipation.        Meds ordered this encounter  Medications  . ferrous sulfate 325 (65 FE) MG tablet    Sig: Take 325 mg by mouth daily with breakfast.     There is no immunization history on file for this patient.  Social History  Substance Use Topics  . Smoking status: Never Smoker   . Smokeless tobacco: Never Used  . Alcohol Use: No    Filed Vitals:   09/27/15 1422  BP: 141/70  Pulse: 64  Temp: 97.5 F (36.4 C)  Resp: 20    Physical Exam  GENERAL APPEARANCE: Alert, conversant. No acute distress.  HEENT: Unremarkable. RESPIRATORY: Breathing is  even, unlabored. Lung sounds are clear   CARDIOVASCULAR: Heart RRR no murmurs, rubs or gallops. No peripheral edema.  GASTROINTESTINAL: Abdomen is soft, non-tender, not distended w/ normal bowel sounds.  NEUROLOGIC: Cranial nerves 2-12 grossly intact; R side weakness   Patient Active Problem List   Diagnosis Date Noted  . Hypomagnesemia 09/20/2015  . Hypokalemia 09/15/2015  . Sinus bradycardia 09/07/2015  . Palliative care encounter 09/01/2015  . DNR (do not resuscitate) 09/01/2015  . Weakness generalized 09/01/2015  . Chronic non-pressure ulcer of sacrum  extending to fat level ([place]) 08/30/2015  . Syncope and collapse 08/29/2015  . Orthostatic hypotension 08/29/2015  . Cerebrovascular accident (CVA) due to stenosis of left carotid artery ([place]) 08/29/2015  . Essential hypertension 08/29/2015  . HLD (hyperlipidemia) 08/29/2015  . Orthostasis 08/29/2015  . Recurrent UTI 08/29/2015  . Urinary retention 08/18/2015  . Lower GI bleed   . CVA (cerebral vascular accident) ([place]) 08/10/2015  . Abnormal WBC count   . Shock ([place])   . Malignant essential hypertension   . Intracranial carotid stenosis   . CVA (cerebral infarction) 07/29/2015  . Abdominal pain, acute 06/19/2015  . Lactic acidosis 06/19/2015  . CLL (chronic lymphocytic leukemia) ([place]) 06/19/2015  . Abdominal pain 06/19/2015  . Left flank pain 06/19/2015  . Dehydration with hyponatremia 05/11/2015  . Hypothyroidism 05/11/2015  . Aortic stenosis, mild 09/07/2014  . Diverticulosis of colon without hemorrhage 07/11/2014  . Colon polyps 07/10/2014  . GI bleed 07/07/2014  . Syncope 07/07/2014  . HTN (hypertension) 07/07/2014  . NSTEMI (non-ST elevated myocardial infarction) ([place]) 07/07/2014  . Anemia, blood loss 07/06/2014  . Carotid artery bruit 08/28/2013  . Heart murmur, systolic 99991111  . RBBB 02/16/2013  . Type 2 diabetes mellitus with peripheral neuropathy ([place]) 02/16/2013  . Benign hypertensive heart disease  without heart failure 08/02/2011  . Pure hypercholesterolemia 08/02/2011  . Asymptomatic PVCs 08/02/2011  . Breast cancer metastasized to bone ([place]) 08/02/2011    CBC    Component Value Date/Time   WBC 22.6* 08/31/2015 0804   WBC 24.8* 02/07/2015 1432   RBC 4.33 08/31/2015 0804   RBC 4.20 02/07/2015 1432   HGB 13.2 08/31/2015 0804   HGB 12.5 02/07/2015 1432   HCT 39.6 08/31/2015 0804   HCT 38.0 02/07/2015 1432   PLT 325 08/31/2015 0804   PLT 336 02/07/2015 1432   MCV 91.5 08/31/2015 0804   MCV 90.5 02/07/2015 1432   LYMPHSABS 9.7* 08/31/2015 0804   LYMPHSABS 13.2* 02/07/2015 1432   MONOABS 1.1* 08/31/2015 0804   MONOABS 1.0* 02/07/2015 1432   EOSABS 0.2 08/31/2015 0804   EOSABS 0.3 02/07/2015 1432   BASOSABS 0.0 08/31/2015 0804   BASOSABS 0.1 02/07/2015 1432    CMP     Component Value Date/Time   NA 135 08/31/2015 0804   NA 136 09/22/2014 1426   K 3.9 08/31/2015 0804   K 3.8 09/22/2014 1426   CL 100* 08/31/2015 0804   CO2 21* 08/31/2015 0804   CO2 21* 09/22/2014 1426   GLUCOSE 181* 08/31/2015 0804   GLUCOSE 308* 09/22/2014 1426   BUN 11 08/31/2015 0804   BUN 11.4 09/22/2014 1426   CREATININE 0.99 08/31/2015 0804   CREATININE 0.9 09/22/2014 1426   CALCIUM 9.5 08/31/2015 0804   CALCIUM 9.5 09/22/2014 1426   PROT 6.3* 08/10/2015 0030   PROT 6.7 09/22/2014 1426   ALBUMIN 3.4* 08/10/2015 0030   ALBUMIN 3.4* 09/22/2014 1426   AST 24 08/10/2015 0030   AST 14 09/22/2014 1426   ALT 22 08/10/2015 0030   ALT 17 09/22/2014 1426   ALKPHOS 80 08/10/2015 0030   ALKPHOS 90 09/22/2014 1426   BILITOT 0.6 08/10/2015 0030   BILITOT 0.23 09/22/2014 1426   GFRNONAA 49* 08/31/2015 0804   GFRAA 56* 08/31/2015 0804    Assessment and Plan  Pt is d/c to home with HH/OT/PT/nursing/CNA. Hypokalemia is resolved but needs to be followed and sacral wound is healed.. Rx's have been written.  Hennie Duos, MD

## 2015-10-10 ENCOUNTER — Encounter: Payer: Self-pay | Admitting: Internal Medicine

## 2015-10-10 ENCOUNTER — Non-Acute Institutional Stay (SKILLED_NURSING_FACILITY): Payer: Medicare Other | Admitting: Internal Medicine

## 2015-10-10 DIAGNOSIS — I63232 Cerebral infarction due to unspecified occlusion or stenosis of left carotid arteries: Secondary | ICD-10-CM | POA: Diagnosis not present

## 2015-10-10 DIAGNOSIS — E871 Hypo-osmolality and hyponatremia: Secondary | ICD-10-CM

## 2015-10-10 DIAGNOSIS — N390 Urinary tract infection, site not specified: Secondary | ICD-10-CM

## 2015-10-10 NOTE — Progress Notes (Signed)
Patient ID: Molly [name], female   DOB: [birth year], 81 y.o.   MRN: [id number] MRN: [id number] Name: Molly [name]  Sex: female Age: 81 y.o. DOB: 1922-12-13  [place] #: [facility] Facility/Room:107 Level Of Care: SNF Provider: Wille Celeste Emergency Contacts: Extended Emergency Contact Information Primary Emergency Contact: Molly [name] Address: [address]          [place] Phone: [phone] Work Phone: [phone] Mobile Phone: [phone] Relation: Daughter  Code Status:   Allergies: Amlodipine; Contrast media; Hctz; Hydralazine; Metoprolol; Codeine; Demerol; Librium; and Lipitor  Chief Complaint  Patient presents with  . Acute Visit   follow-up UTI pre  discharge  HPI: Patient is 81 y.o. 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 Phoebe Sharps neurology office with chest pain after a syncopal episode. In brief, [name] had a stroke in September 2016 and received TPA. She was readmitted in October with progression of the stroke and while in the hospital had a second stroke. She was found to have bilateral severe carotid stenosis. During the same hospitalization she had a diverticular bleed. She developed very labile blood pressures.  . Pt was admitted from 10/17-21 to evaluate syncopy and hypotension, seen by neuro and cards, no interventions possible, permissive hypertention. Pt was admitted to SNF for generalized weakness and ongoing orthostatic hypotension to work with OT/PT for that. Pt is now ready to be d/c to home Patient had complained of dysuria and urine culture has grown out Escherichia coli I am following up on this.  She has been afebrile vital signs are stable she is being discharged later today.  Past Medical History  Diagnosis Date  . Labile hypertension   . Aortic  stenosis     a. Mild by echo 07/2015.  Marland Kitchen Hypothyroidism   . Anemia   . Hypercholesterolemia   . Heart murmur   . Type II diabetes mellitus ([place])   . Arthritis     "back; knees, hands" (05/11/2015)  . Breast cancer, right breast ([place])     "cells went into the blood and caused her hip to break" (05/11/2015)  . Stroke (cerebrum) ([place])     a. Stroke 07/2015 - received TPA and placed on DAPT; admit c/b diverticular bleed, strokes felt 2/2 hypoperfusion in setting of low BP with severe intracranial vascular disease.  . Intracranial vascular stenosis     a. severe bilateral intracranial vascular stenosis.  Marland Kitchen History of GI diverticular bleed 07/2015    Past Surgical History  Procedure Laterality Date  . Hemiarthroplasty hip Right     "partial replacement"  . Pelvic and para-aortic lymph node dissection    . Tonsillectomy    . Appendectomy    . Cholecystectomy    . US echocardiography  03/30/2009    EF 55-60%  . Cardiovascular stress test  01/28/2008    EF 74%  . Esophagogastroduodenoscopy N/A 07/08/2014    Procedure: ESOPHAGOGASTRODUODENOSCOPY (EGD);  Surgeon: Winfield Cunas., MD;  Location: [facility];  Service: Endoscopy;  Laterality: N/A;  . Colonoscopy N/A 07/10/2014    Procedure: COLONOSCOPY;  Surgeon: Lear Ng, MD;  Location: [facility];  Service: Endoscopy;  Laterality: N/A;  . Total abdominal hysterectomy      w/BSO  . Dilation and curettage of uterus    . Breast biopsy Right 2009  . Breast lumpectomy Right 2009  .  Fracture surgery    . Cataract extraction w/ intraocular lens  implant, bilateral Bilateral       Medication List       This list is accurate as of: 10/10/15 11:59 PM.  Always use your most recent med list.               ACCU-CHEK SMARTVIEW test strip  Generic drug:  glucose blood     alum & mag hydroxide-simeth 200-200-20 MG/5ML suspension  Commonly known as:  MAALOX/MYLANTA  Take 30 mLs by mouth every 6 (six) hours as needed for  indigestion or heartburn (dyspepsia).     amLODipine 10 MG tablet  Commonly known as:  NORVASC  Take 1 tablet (10 mg total) by mouth daily.     anastrozole 1 MG tablet  Commonly known as:  ARIMIDEX  TAKE 1 TABLET BY MOUTH EVERY DAY     atenolol 25 MG tablet  Commonly known as:  TENORMIN  Take 1 tablet (25 mg total) by mouth 3 (three) times daily.     CALCIUM + D PO  Take 1 tablet by mouth daily after lunch.     clopidogrel 75 MG tablet  Commonly known as:  PLAVIX  Take 1 tablet (75 mg total) by mouth daily.     cyanocobalamin 500 MCG tablet  Take 1 tablet (500 mcg total) by mouth daily with lunch.     ezetimibe 10 MG tablet  Commonly known as:  ZETIA  Take 1 tablet (10 mg total) by mouth daily.     ferrous sulfate 325 (65 FE) MG tablet  Take 325 mg by mouth daily with breakfast.     insulin glargine 100 UNIT/ML injection  Commonly known as:  LANTUS  Inject 0.1 mLs (10 Units total) into the skin daily at 12 noon.     irbesartan 300 MG tablet  Commonly known as:  AVAPRO  Take 1 tablet (300 mg total) by mouth daily.     levothyroxine 75 MCG tablet  Commonly known as:  SYNTHROID, LEVOTHROID  Take 75 mcg by mouth daily before breakfast.     nitrofurantoin (macrocrystal-monohydrate) 100 MG capsule  Commonly known as:  MACROBID  Take 100 mg by mouth 2 (two) times daily. Start on 10/10/15 for a seven day course     polyethylene glycol packet  Commonly known as:  MIRALAX / GLYCOLAX  Take 17 g by mouth daily as needed for mild constipation.        Meds ordered this encounter  Medications  . nitrofurantoin, macrocrystal-monohydrate, (MACROBID) 100 MG capsule    Sig: Take 100 mg by mouth 2 (two) times daily. Start on 10/10/15 for a seven day course     There is no immunization history on file for this patient.  Social History  Substance Use Topics  . Smoking status: Never Smoker   . Smokeless tobacco: Never Used  . Alcohol Use: No     Review of  systems.  In general does not complain of fever chills.  Skin does not complain of rashes or itching.  Head ears eyes nose mouth and throat does not complaining of sore throat or nasal discharge or visual changes.  Respiratory does not complain of cough or shortness of breath.  Cardiac denies chest pain.  GI is not complaining of abdominal discomfort diarrhea or nausea.  GU urine culture does show UTI she has complained of dysuria.  Neurologic is status post CVA with right-sided weakness does not complain of headache  as itchiness does have a history of syncopal episodes but is not complaining of that recently.  Musculoskeletal is not complaining of joint pain currently Filed Vitals:   10/10/15 2213  BP: 144/67  Pulse: 75  Temp: 98 F (36.7 C)  Resp: 16    Physical Exam  GENERAL APPEARANCE: Alert, conversant. No acute distress Skin is warm and dry.  HEENT: Unremarkable. RESPIRATORY: Breathing is even, unlabored. Lung sounds are clear   CARDIOVASCULAR: Heart RRR no murmurs, rubs or gallops. No peripheral edema.  GASTROINTESTINAL: Abdomen is soft, non-tender, not distended w/ normal bowel sounds GU does not complain of over suprapubic tenderness with palpation.--Do not appreciate-discharge or drainage  NEUROLOGIC: Cranial nerves 2-12 grossly intact; R side weakness Psych she is alert and oriented pleasant and appropriate   Patient Active Problem List   Diagnosis Date Noted  . Hyponatremia 10/10/2015  . Hypomagnesemia 09/20/2015  . Hypokalemia 09/15/2015  . Sinus bradycardia 09/07/2015  . Palliative care encounter 09/01/2015  . DNR (do not resuscitate) 09/01/2015  . Weakness generalized 09/01/2015  . Chronic non-pressure ulcer of sacrum extending to fat level ([place]) 08/30/2015  . Syncope and collapse 08/29/2015  . Orthostatic hypotension 08/29/2015  . Cerebrovascular accident (CVA) due to stenosis of left carotid artery ([place]) 08/29/2015  . Essential hypertension  08/29/2015  . HLD (hyperlipidemia) 08/29/2015  . Orthostasis 08/29/2015  . Recurrent UTI 08/29/2015  . Urinary retention 08/18/2015  . Lower GI bleed   . CVA (cerebral vascular accident) ([place]) 08/10/2015  . Abnormal WBC count   . Shock ([place])   . Malignant essential hypertension   . Intracranial carotid stenosis   . CVA (cerebral infarction) 07/29/2015  . Abdominal pain, acute 06/19/2015  . Lactic acidosis 06/19/2015  . CLL (chronic lymphocytic leukemia) ([place]) 06/19/2015  . Abdominal pain 06/19/2015  . Left flank pain 06/19/2015  . Dehydration with hyponatremia 05/11/2015  . Hypothyroidism 05/11/2015  . Aortic stenosis, mild 09/07/2014  . Diverticulosis of colon without hemorrhage 07/11/2014  . Colon polyps 07/10/2014  . GI bleed 07/07/2014  . Syncope 07/07/2014  . HTN (hypertension) 07/07/2014  . NSTEMI (non-ST elevated myocardial infarction) ([place]) 07/07/2014  . Anemia, blood loss 07/06/2014  . Carotid artery bruit 08/28/2013  . Heart murmur, systolic 99991111  . RBBB 02/16/2013  . Type 2 diabetes mellitus with peripheral neuropathy ([place]) 02/16/2013  . Benign hypertensive heart disease without heart failure 08/02/2011  . Pure hypercholesterolemia 08/02/2011  . Asymptomatic PVCs 08/02/2011  . Breast cancer metastasized to bone ([place]) 08/02/2011    Labs.  Most recent sodium was 128 potassium 4.9 BUN 16 creatinine 0.8 this was on November 14  CBC    Component Value Date/Time   WBC 22.6* 08/31/2015 0804   WBC 24.8* 02/07/2015 1432   RBC 4.33 08/31/2015 0804   RBC 4.20 02/07/2015 1432   HGB 13.2 08/31/2015 0804   HGB 12.5 02/07/2015 1432   HCT 39.6 08/31/2015 0804   HCT 38.0 02/07/2015 1432   PLT 325 08/31/2015 0804   PLT 336 02/07/2015 1432   MCV 91.5 08/31/2015 0804   MCV 90.5 02/07/2015 1432   LYMPHSABS 9.7* 08/31/2015 0804   LYMPHSABS 13.2* 02/07/2015 1432   MONOABS 1.1* 08/31/2015 0804   MONOABS 1.0* 02/07/2015 1432   EOSABS 0.2 08/31/2015 0804   EOSABS  0.3 02/07/2015 1432   BASOSABS 0.0 08/31/2015 0804   BASOSABS 0.1 02/07/2015 1432    CMP     Component Value Date/Time   NA 135 08/31/2015 0804   NA  136 09/22/2014 1426   K 3.9 08/31/2015 0804   K 3.8 09/22/2014 1426   CL 100* 08/31/2015 0804   CO2 21* 08/31/2015 0804   CO2 21* 09/22/2014 1426   GLUCOSE 181* 08/31/2015 0804   GLUCOSE 308* 09/22/2014 1426   BUN 11 08/31/2015 0804   BUN 11.4 09/22/2014 1426   CREATININE 0.99 08/31/2015 0804   CREATININE 0.9 09/22/2014 1426   CALCIUM 9.5 08/31/2015 0804   CALCIUM 9.5 09/22/2014 1426   PROT 6.3* 08/10/2015 0030   PROT 6.7 09/22/2014 1426   ALBUMIN 3.4* 08/10/2015 0030   ALBUMIN 3.4* 09/22/2014 1426   AST 24 08/10/2015 0030   AST 14 09/22/2014 1426   ALT 22 08/10/2015 0030   ALT 17 09/22/2014 1426   ALKPHOS 80 08/10/2015 0030   ALKPHOS 90 09/22/2014 1426   BILITOT 0.6 08/10/2015 0030   BILITOT 0.23 09/22/2014 1426   GFRNONAA 49* 08/31/2015 0804   GFRAA 56* 08/31/2015 0804    Assessment and Plan  UTI-positive for Escherichia coli it is sensitive to Macrobid this appears to be the by mouth alternative-will treat with 100 mg twice a day for 7 days this will need follow-up at her new facility-pharmacy at new facility may have to adjust for renal function and this was discussed with the transfer service.  #2 history of hyponatremia  this will need expedient follow-up as well by primary care provider at new facility.--Clinically she appears stable    Pt is d/c to [facility] with HH/OT/PT.--with history of CVA and right-sided weakness Hypokalemia is resolved but needs to be followed  As well and sacral wound is healed   [id number] .  Wille Celeste, MD

## 2015-11-10 ENCOUNTER — Other Ambulatory Visit: Payer: Self-pay

## 2015-11-10 NOTE — Patient Outreach (Signed)
Outreach attempt made to daughter, [name], to obtain modified rankin score. Left voicemail requesting callback.

## 2015-11-11 ENCOUNTER — Other Ambulatory Visit: Payer: Self-pay

## 2015-11-11 NOTE — Patient Outreach (Signed)
Modified Rankin survey performed with caregiver, Mardene Celeste, at [employer]. Patient scored a 5.

## 2015-11-11 NOTE — Patient Outreach (Signed)
Outreach attempt made to contact patient's daughter.  HIPAA compliant voicemail was left requesting callback to my cell:  [phone]. Kenney Houseman, [facility] [phone]

## 2015-12-06 ENCOUNTER — Telehealth: Payer: Self-pay | Admitting: Cardiology

## 2015-12-06 NOTE — Telephone Encounter (Signed)
NEw Message  Pt dtr calling to speak w/ RN about pt's zetia. Please call back and discuss.

## 2015-12-06 NOTE — Telephone Encounter (Signed)
Molinda Bailiff LPN working on [employer] for [employer]

## 2015-12-07 NOTE — Telephone Encounter (Signed)
Patient's daughter stated she talked to the nursing home and decided it would be better for Dr. Mare Ferrari to file paperwork for Zetia.

## 2015-12-19 NOTE — Telephone Encounter (Signed)
Spoke with Molinda Bailiff LPN doing PA, received notification that [facility] does not require PA Left message for daughter to call back

## 2015-12-21 ENCOUNTER — Encounter: Payer: Self-pay | Admitting: Cardiology

## 2015-12-21 NOTE — Telephone Encounter (Signed)
Returning your call. °

## 2015-12-23 NOTE — Telephone Encounter (Signed)
Spoke with daughter and information given    Call Documentation      Romana Juniper at 12/21/2015 9:49 AM     Status: Signed       Expand All Collapse All   Returning your call

## 2015-12-23 NOTE — Telephone Encounter (Signed)
This encounter was created in error - please disregard.

## 2015-12-23 NOTE — Telephone Encounter (Signed)
Per daughter she received a letter that Zetia would be covered

## 2016-01-26 ENCOUNTER — Inpatient Hospital Stay (HOSPITAL_COMMUNITY)
Admission: EM | Admit: 2016-01-26 | Discharge: 2016-02-01 | DRG: 193 | Disposition: A | Discharge status: Hospice | Payer: Medicare Other | Attending: Internal Medicine | Admitting: Internal Medicine

## 2016-01-26 ENCOUNTER — Emergency Department (HOSPITAL_COMMUNITY): Payer: Medicare Other

## 2016-01-26 ENCOUNTER — Encounter (HOSPITAL_COMMUNITY): Payer: Self-pay | Admitting: *Deleted

## 2016-01-26 DIAGNOSIS — C50911 Malignant neoplasm of unspecified site of right female breast: Secondary | ICD-10-CM | POA: Diagnosis not present

## 2016-01-26 DIAGNOSIS — Z87440 Personal history of urinary (tract) infections: Secondary | ICD-10-CM

## 2016-01-26 DIAGNOSIS — C50919 Malignant neoplasm of unspecified site of unspecified female breast: Secondary | ICD-10-CM | POA: Diagnosis present

## 2016-01-26 DIAGNOSIS — E038 Other specified hypothyroidism: Secondary | ICD-10-CM

## 2016-01-26 DIAGNOSIS — L89159 Pressure ulcer of sacral region, unspecified stage: Secondary | ICD-10-CM | POA: Diagnosis present

## 2016-01-26 DIAGNOSIS — E1159 Type 2 diabetes mellitus with other circulatory complications: Secondary | ICD-10-CM | POA: Diagnosis present

## 2016-01-26 DIAGNOSIS — L8990 Pressure ulcer of unspecified site, unspecified stage: Secondary | ICD-10-CM | POA: Insufficient documentation

## 2016-01-26 DIAGNOSIS — J9601 Acute respiratory failure with hypoxia: Secondary | ICD-10-CM | POA: Diagnosis present

## 2016-01-26 DIAGNOSIS — R0600 Dyspnea, unspecified: Secondary | ICD-10-CM | POA: Diagnosis not present

## 2016-01-26 DIAGNOSIS — C7951 Secondary malignant neoplasm of bone: Secondary | ICD-10-CM | POA: Diagnosis present

## 2016-01-26 DIAGNOSIS — Z6826 Body mass index (BMI) 26.0-26.9, adult: Secondary | ICD-10-CM | POA: Diagnosis not present

## 2016-01-26 DIAGNOSIS — J9600 Acute respiratory failure, unspecified whether with hypoxia or hypercapnia: Secondary | ICD-10-CM

## 2016-01-26 DIAGNOSIS — Z8249 Family history of ischemic heart disease and other diseases of the circulatory system: Secondary | ICD-10-CM

## 2016-01-26 DIAGNOSIS — I63232 Cerebral infarction due to unspecified occlusion or stenosis of left carotid arteries: Secondary | ICD-10-CM | POA: Diagnosis not present

## 2016-01-26 DIAGNOSIS — E7800 Pure hypercholesterolemia, unspecified: Secondary | ICD-10-CM | POA: Diagnosis present

## 2016-01-26 DIAGNOSIS — B9689 Other specified bacterial agents as the cause of diseases classified elsewhere: Secondary | ICD-10-CM | POA: Diagnosis present

## 2016-01-26 DIAGNOSIS — Z833 Family history of diabetes mellitus: Secondary | ICD-10-CM | POA: Diagnosis not present

## 2016-01-26 DIAGNOSIS — J181 Lobar pneumonia, unspecified organism: Secondary | ICD-10-CM

## 2016-01-26 DIAGNOSIS — Y95 Nosocomial condition: Secondary | ICD-10-CM | POA: Diagnosis present

## 2016-01-26 DIAGNOSIS — J189 Pneumonia, unspecified organism: Secondary | ICD-10-CM | POA: Diagnosis present

## 2016-01-26 DIAGNOSIS — B961 Klebsiella pneumoniae [K. pneumoniae] as the cause of diseases classified elsewhere: Secondary | ICD-10-CM | POA: Diagnosis present

## 2016-01-26 DIAGNOSIS — E1165 Type 2 diabetes mellitus with hyperglycemia: Secondary | ICD-10-CM

## 2016-01-26 DIAGNOSIS — E43 Unspecified severe protein-calorie malnutrition: Secondary | ICD-10-CM | POA: Diagnosis present

## 2016-01-26 DIAGNOSIS — I10 Essential (primary) hypertension: Secondary | ICD-10-CM | POA: Diagnosis present

## 2016-01-26 DIAGNOSIS — Z79811 Long term (current) use of aromatase inhibitors: Secondary | ICD-10-CM

## 2016-01-26 DIAGNOSIS — E1169 Type 2 diabetes mellitus with other specified complication: Secondary | ICD-10-CM | POA: Diagnosis present

## 2016-01-26 DIAGNOSIS — Z66 Do not resuscitate: Secondary | ICD-10-CM | POA: Diagnosis present

## 2016-01-26 DIAGNOSIS — R531 Weakness: Secondary | ICD-10-CM

## 2016-01-26 DIAGNOSIS — E785 Hyperlipidemia, unspecified: Secondary | ICD-10-CM | POA: Diagnosis present

## 2016-01-26 DIAGNOSIS — E039 Hypothyroidism, unspecified: Secondary | ICD-10-CM | POA: Diagnosis present

## 2016-01-26 DIAGNOSIS — Z515 Encounter for palliative care: Secondary | ICD-10-CM | POA: Diagnosis present

## 2016-01-26 DIAGNOSIS — D72829 Elevated white blood cell count, unspecified: Secondary | ICD-10-CM | POA: Diagnosis present

## 2016-01-26 DIAGNOSIS — E1359 Other specified diabetes mellitus with other circulatory complications: Secondary | ICD-10-CM | POA: Diagnosis not present

## 2016-01-26 DIAGNOSIS — Z794 Long term (current) use of insulin: Secondary | ICD-10-CM

## 2016-01-26 DIAGNOSIS — C9110 Chronic lymphocytic leukemia of B-cell type not having achieved remission: Secondary | ICD-10-CM | POA: Diagnosis present

## 2016-01-26 DIAGNOSIS — Z79899 Other long term (current) drug therapy: Secondary | ICD-10-CM

## 2016-01-26 DIAGNOSIS — Z7189 Other specified counseling: Secondary | ICD-10-CM | POA: Insufficient documentation

## 2016-01-26 DIAGNOSIS — R05 Cough: Secondary | ICD-10-CM | POA: Diagnosis not present

## 2016-01-26 DIAGNOSIS — N390 Urinary tract infection, site not specified: Secondary | ICD-10-CM | POA: Diagnosis present

## 2016-01-26 DIAGNOSIS — I69351 Hemiplegia and hemiparesis following cerebral infarction affecting right dominant side: Secondary | ICD-10-CM

## 2016-01-26 DIAGNOSIS — B964 Proteus (mirabilis) (morganii) as the cause of diseases classified elsewhere: Secondary | ICD-10-CM | POA: Diagnosis present

## 2016-01-26 DIAGNOSIS — R079 Chest pain, unspecified: Secondary | ICD-10-CM

## 2016-01-26 LAB — CBC WITH DIFFERENTIAL/PLATELET
BASOS ABS: 0 10*3/uL (ref 0.0–0.1)
BASOS ABS: 0.1 10*3/uL (ref 0.0–0.1)
BASOS PCT: 0 %
Basophils Relative: 0 %
EOS ABS: 0 10*3/uL (ref 0.0–0.7)
EOS ABS: 0 10*3/uL (ref 0.0–0.7)
EOS PCT: 0 %
Eosinophils Relative: 0 %
HCT: 39 % (ref 36.0–46.0)
HCT: 38.6 % (ref 36.0–46.0)
HEMOGLOBIN: 12.7 g/dL (ref 12.0–15.0)
Hemoglobin: 13.1 g/dL (ref 12.0–15.0)
LYMPHS ABS: 14.2 10*3/uL — AB (ref 0.7–4.0)
Lymphocytes Relative: 43 %
Lymphocytes Relative: 39 %
Lymphs Abs: 14.5 10*3/uL — ABNORMAL HIGH (ref 0.7–4.0)
MCH: 30.5 pg (ref 26.0–34.0)
MCH: 30 pg (ref 26.0–34.0)
MCHC: 33.6 g/dL (ref 30.0–36.0)
MCHC: 32.9 g/dL (ref 30.0–36.0)
MCV: 90.7 fL (ref 78.0–100.0)
MCV: 91.3 fL (ref 78.0–100.0)
MONO ABS: 2 10*3/uL — AB (ref 0.1–1.0)
MONO ABS: 1.4 10*3/uL — AB (ref 0.1–1.0)
Monocytes Relative: 6 %
Monocytes Relative: 4 %
NEUTROS ABS: 16.9 10*3/uL — AB (ref 1.7–7.7)
NEUTROS ABS: 21 10*3/uL — AB (ref 1.7–7.7)
Neutrophils Relative %: 51 %
Neutrophils Relative %: 57 %
PLATELETS: 359 10*3/uL (ref 150–400)
Platelets: 353 10*3/uL (ref 150–400)
RBC: 4.3 MIL/uL (ref 3.87–5.11)
RBC: 4.23 MIL/uL (ref 3.87–5.11)
RDW: 13.6 % (ref 11.5–15.5)
RDW: 13.7 % (ref 11.5–15.5)
WBC: 33.1 10*3/uL — ABNORMAL HIGH (ref 4.0–10.5)
WBC: 37 10*3/uL — ABNORMAL HIGH (ref 4.0–10.5)

## 2016-01-26 LAB — COMPREHENSIVE METABOLIC PANEL
ALT: 10 U/L — AB (ref 14–54)
AST: 11 U/L — AB (ref 15–41)
Albumin: 3 g/dL — ABNORMAL LOW (ref 3.5–5.0)
Alkaline Phosphatase: 79 U/L (ref 38–126)
Anion gap: 10 (ref 5–15)
BILIRUBIN TOTAL: 0.8 mg/dL (ref 0.3–1.2)
BUN: 16 mg/dL (ref 6–20)
CO2: 22 mmol/L (ref 22–32)
CREATININE: 0.57 mg/dL (ref 0.44–1.00)
Calcium: 8.8 mg/dL — ABNORMAL LOW (ref 8.9–10.3)
Chloride: 98 mmol/L — ABNORMAL LOW (ref 101–111)
GFR calc Af Amer: >60 mL/min (ref >60)
Glucose, Bld: 197 mg/dL — ABNORMAL HIGH (ref 65–99)
POTASSIUM: 3.7 mmol/L (ref 3.5–5.1)
Sodium: 130 mmol/L — ABNORMAL LOW (ref 135–145)
TOTAL PROTEIN: 6 g/dL — AB (ref 6.5–8.1)

## 2016-01-26 LAB — INFLUENZA PANEL BY PCR (TYPE A & B)
H1N1 flu by pcr: NOT DETECTED
INFLAPCR: NEGATIVE
INFLBPCR: NEGATIVE

## 2016-01-26 LAB — PATHOLOGIST SMEAR REVIEW

## 2016-01-26 LAB — URINALYSIS, ROUTINE W REFLEX MICROSCOPIC
BILIRUBIN URINE: NEGATIVE
Glucose, UA: NEGATIVE mg/dL
HGB URINE DIPSTICK: NEGATIVE
Ketones, ur: NEGATIVE mg/dL
Nitrite: POSITIVE — AB
Protein, ur: 30 mg/dL — AB
SPECIFIC GRAVITY, URINE: 1.021 (ref 1.005–1.030)
pH: 8 (ref 5.0–8.0)

## 2016-01-26 LAB — GLUCOSE, CAPILLARY
Glucose-Capillary: 220 mg/dL — ABNORMAL HIGH (ref 65–99)
Glucose-Capillary: 167 mg/dL — ABNORMAL HIGH (ref 65–99)

## 2016-01-26 LAB — URINE MICROSCOPIC-ADD ON

## 2016-01-26 LAB — BRAIN NATRIURETIC PEPTIDE: B NATRIURETIC PEPTIDE 5: 140 pg/mL — AB (ref 0.0–100.0)

## 2016-01-26 LAB — TROPONIN I

## 2016-01-26 LAB — STREP PNEUMONIAE URINARY ANTIGEN: STREP PNEUMO URINARY ANTIGEN: NEGATIVE

## 2016-01-26 MED ORDER — POLYETHYLENE GLYCOL 3350 17 G PO PACK: 17.0000 g | PACK | Freq: Every day | ORAL | Status: DC | PRN

## 2016-01-26 MED ORDER — INSULIN DETEMIR 100 UNIT/ML ~~LOC~~ SOLN
10.0000 [IU] | Freq: Every day | SUBCUTANEOUS | Status: DC
  Administered 2016-01-26 – 2016-01-30 (×4): 10 [IU] via SUBCUTANEOUS
  Filled 2016-01-26 (×7): qty 0.1

## 2016-01-26 MED ORDER — CLOPIDOGREL BISULFATE 75 MG PO TABS
75.0000 mg | ORAL_TABLET | Freq: Every day | ORAL | Status: DC
  Administered 2016-01-26 – 2016-01-30 (×4): 75 mg via ORAL
  Filled 2016-01-26 (×4): qty 1

## 2016-01-26 MED ORDER — INSULIN GLARGINE 100 UNIT/ML ~~LOC~~ SOLN: 18.0000 [IU] | Freq: Every day | SUBCUTANEOUS | Status: DC

## 2016-01-26 MED ORDER — CEFEPIME HCL 1 G IJ SOLR
1.0000 g | Freq: Once | INTRAMUSCULAR | Status: AC
  Administered 2016-01-26: 1 g via INTRAVENOUS
  Filled 2016-01-26: qty 1

## 2016-01-26 MED ORDER — ALUM & MAG HYDROXIDE-SIMETH 200-200-20 MG/5ML PO SUSP: 30.0000 mL | Freq: Four times a day (QID) | ORAL | Status: DC | PRN

## 2016-01-26 MED ORDER — VANCOMYCIN HCL IN DEXTROSE 1-5 GM/200ML-% IV SOLN
1000.0000 mg | INTRAVENOUS | Status: DC
  Administered 2016-01-27 – 2016-01-29 (×3): 1000 mg via INTRAVENOUS
  Filled 2016-01-26 (×4): qty 200

## 2016-01-26 MED ORDER — EZETIMIBE 10 MG PO TABS
10.0000 mg | ORAL_TABLET | Freq: Every day | ORAL | Status: DC
  Administered 2016-01-26 – 2016-01-28 (×3): 10 mg via ORAL
  Filled 2016-01-26 (×3): qty 1

## 2016-01-26 MED ORDER — SODIUM CHLORIDE 0.9 % IV SOLN
INTRAVENOUS | Status: DC
  Administered 2016-01-26 – 2016-01-27 (×2): via INTRAVENOUS

## 2016-01-26 MED ORDER — ANASTROZOLE 1 MG PO TABS
1.0000 mg | ORAL_TABLET | Freq: Every day | ORAL | Status: DC
  Administered 2016-01-26 – 2016-01-30 (×4): 1 mg via ORAL
  Filled 2016-01-26 (×7): qty 1

## 2016-01-26 MED ORDER — AMLODIPINE BESYLATE 10 MG PO TABS
10.0000 mg | ORAL_TABLET | Freq: Every day | ORAL | Status: DC
  Administered 2016-01-26 – 2016-01-30 (×4): 10 mg via ORAL
  Filled 2016-01-26 (×4): qty 1

## 2016-01-26 MED ORDER — CYANOCOBALAMIN 500 MCG PO TABS
500.0000 ug | ORAL_TABLET | Freq: Every day | ORAL | Status: DC
  Administered 2016-01-26 – 2016-01-28 (×3): 500 ug via ORAL
  Filled 2016-01-26 (×6): qty 1

## 2016-01-26 MED ORDER — INSULIN ASPART 100 UNIT/ML ~~LOC~~ SOLN
0.0000 [IU] | Freq: Three times a day (TID) | SUBCUTANEOUS | Status: DC
  Administered 2016-01-26: 3 [IU] via SUBCUTANEOUS
  Administered 2016-01-27 – 2016-01-28 (×5): 2 [IU] via SUBCUTANEOUS
  Administered 2016-01-28: 1 [IU] via SUBCUTANEOUS
  Administered 2016-01-29 – 2016-01-31 (×5): 2 [IU] via SUBCUTANEOUS
  Administered 2016-02-01: 1 [IU] via SUBCUTANEOUS

## 2016-01-26 MED ORDER — ENSURE ENLIVE PO LIQD
237.0000 mL | Freq: Two times a day (BID) | ORAL | Status: DC
  Administered 2016-01-27: 237 mL via ORAL
  Filled 2016-01-26: qty 237

## 2016-01-26 MED ORDER — DEXTROSE 5 % IV SOLN
1.0000 g | Freq: Two times a day (BID) | INTRAVENOUS | Status: DC
  Administered 2016-01-26 – 2016-01-30 (×9): 1 g via INTRAVENOUS
  Filled 2016-01-26 (×11): qty 1

## 2016-01-26 MED ORDER — GUAIFENESIN-DM 100-10 MG/5ML PO SYRP
5.0000 mL | ORAL_SOLUTION | ORAL | Status: DC | PRN
  Administered 2016-01-27 – 2016-01-30 (×4): 5 mL via ORAL
  Filled 2016-01-26 (×5): qty 10

## 2016-01-26 MED ORDER — VANCOMYCIN HCL 10 G IV SOLR
1250.0000 mg | Freq: Once | INTRAVENOUS | Status: AC
  Administered 2016-01-26: 1250 mg via INTRAVENOUS
  Filled 2016-01-26: qty 1250

## 2016-01-26 MED ORDER — LEVOTHYROXINE SODIUM 75 MCG PO TABS
75.0000 ug | ORAL_TABLET | Freq: Every day | ORAL | Status: DC
Start: 2016-01-27 — End: 2016-02-01
  Administered 2016-01-27 – 2016-02-01 (×5): 75 ug via ORAL
  Filled 2016-01-26 (×2): qty 3
  Filled 2016-01-26: qty 1
  Filled 2016-01-26: qty 3
  Filled 2016-01-26 (×2): qty 1
  Filled 2016-01-26: qty 3
  Filled 2016-01-26 (×3): qty 1
  Filled 2016-01-26 (×2): qty 3
  Filled 2016-01-26: qty 1

## 2016-01-26 MED ORDER — ATENOLOL 25 MG PO TABS
25.0000 mg | ORAL_TABLET | Freq: Three times a day (TID) | ORAL | Status: DC
  Administered 2016-01-26 – 2016-01-28 (×7): 25 mg via ORAL
  Filled 2016-01-26 (×7): qty 1

## 2016-01-26 MED ORDER — SODIUM CHLORIDE 0.9 % IV SOLN: INTRAVENOUS | Status: DC

## 2016-01-26 NOTE — ED Notes (Signed)
Per EMS report: pt from [facility] and has had a cough for about 2 weeks. The facility has been giving pt musinex to help with her respiratory symptoms but they have not improved.  Pt reports abd pain today and "not feeling well."  Pt hx stroke with right sided deficits but a/o x 4. Pt normally bed ridden.

## 2016-01-26 NOTE — H&P (Addendum)
[facility] History and Physical  Molly [name] [id number] DOB: 08/01/1923 DOA: 01/26/2016  Referring physician: ER physician: Dr. Davonna Belling  PCP: Antony Blackbird, MD  Chief Complaint: Cough  HPI:  82 year old female with past medical history of recurrent UTIs, breast cancer with bone metastasis, history of CVA with residual right hemiparesis, diabetes mellitus on insulin, dyslipidemia, hypothyroidism, hypertension who presented from skilled nursing facility where reports of generalized weakness and cough intermittently productive of clear sputum for past 2 weeks prior to this admission. No reports of fevers or chills. No reports of associated chest pain. Patient reports having taken Mucinex in skilled nursing facility but without significant symptomatic relief. No reports of abdominal pain, nausea or vomiting. No diarrhea or constipation. No reports of blood in the stool or urine. No lightheadedness or falls.  In ED, patient was hemodynamically stable, afebrile. White blood cell count was 33.1, normal creatinine, normal troponin level. Chest x-ray showed left lower lobe consolidation consistent with pneumonia. Because patient is from nursing home we started broad-spectrum antibiotics, vancomycin and cefepime for healthcare associated pneumonia. Patient was also found to have UTI as urinalysis showed moderate leukocytes, positive nitrites and many bacteria.   Assessment & Plan    Principal Problem:   Generalized weakness - Likely secondary to acute infection, pneumonia and urinary tract infection - Provide supportive care, antibiotics for acute infectious process - Physical therapy evaluation pending  Active Problems:   HCAP (healthcare-associated pneumonia) / Left lower lobe pneumonia / Leukocytosis - Pneumonia order set placed - Started vancomycin and cefepime - Follow-up respiratory culture results, blood culture results, strep pneumonia, Legionella, influenza and  HIV - Stable respiratory status - Use robitussin every 4-6 hours as needed for cough    UTI (lower urinary tract infection) - Urinalysis showed moderate leukocytes, positive nitrites and many bacteria - Current antibiotics will cover for UTI    Breast cancer metastasized to bone (HCC) - Continue Arimidex     Cerebrovascular accident (CVA) due to stenosis of left carotid artery (HCC) - Stable, with residual right sided hemiparesis - Continue Plavix for secondary stroke prevention    Controlled diabetes mellitus with circulatory complication, with long-term current use of insulin (HCC) - Continue Lantus 18 units daily along with sliding scale insulin    Hypothyroidism - Continue Synthroid    Dyslipidemia associated with type 2 diabetes mellitus (HCC) - Continue zetia  DVT prophylaxis:  - SCDs bilaterally  Radiological Exams on Admission: Dg Chest 2 View 01/26/2016  Question COPD with LEFT lower lobe consolidation consistent with pneumonia. Electronically Signed   By: Lavonia Dana M.D.   On: 01/26/2016 10:00    EKG: 12 lead EKG not done in ED  Code Status: DNR/DNI Family Communication: Plan of care discussed with the patient and her daughter at the bedside Disposition Plan: Admit for further evaluation, medical floor  Leisa Lenz, MD  [facility] Pager [phone]  Time spent in minutes: 75 minutes  Review of Systems:  Constitutional: Negative for fever, chills and malaise/fatigue. Negative for diaphoresis.  HENT: Negative for hearing loss, ear pain, nosebleeds, congestion, sore throat, neck pain, tinnitus and ear discharge.   Eyes: Negative for blurred vision, double vision, photophobia, pain, discharge and redness.  Respiratory: Negative for hemoptysis, sputum production, shortness of breath, wheezing and stridor.   Cardiovascular: Negative for chest pain, palpitations, orthopnea, claudication and leg swelling.  Gastrointestinal: Negative for nausea, vomiting and  abdominal pain. Negative for heartburn, constipation, blood in stool and melena.  Genitourinary: Negative  for dysuria, urgency, frequency, hematuria and flank pain.  Musculoskeletal: Negative for myalgias, back pain, joint pain and falls.  Skin: Negative for itching and rash.  Neurological: Negative for dizziness and positive for weakness. Negative for tingling, tremors, sensory change, speech change, focal weakness, loss of consciousness and headaches.  Endo/Heme/Allergies: Negative for environmental allergies and polydipsia. Does not bruise/bleed easily.  Psychiatric/Behavioral: Negative for suicidal ideas. The patient is not nervous/anxious.      Past Medical History  Diagnosis Date  . Labile hypertension   . Aortic stenosis     a. Mild by echo 07/2015.  Marland Kitchen Hypothyroidism   . Anemia   . Hypercholesterolemia   . Heart murmur   . Type II diabetes mellitus ([place])   . Arthritis     "back; knees, hands" (05/11/2015)  . Breast cancer, right breast ([place])     "cells went into the blood and caused her hip to break" (05/11/2015)  . Stroke (cerebrum) ([place])     a. Stroke 07/2015 - received TPA and placed on DAPT; admit c/b diverticular bleed, strokes felt 2/2 hypoperfusion in setting of low BP with severe intracranial vascular disease.  . Intracranial vascular stenosis     a. severe bilateral intracranial vascular stenosis.  Marland Kitchen History of GI diverticular bleed 07/2015   Past Surgical History  Procedure Laterality Date  . Hemiarthroplasty hip Right     "partial replacement"  . Pelvic and para-aortic lymph node dissection    . Tonsillectomy    . Appendectomy    . Cholecystectomy    . US echocardiography  03/30/2009    EF 55-60%  . Cardiovascular stress test  01/28/2008    EF 74%  . Esophagogastroduodenoscopy N/A 07/08/2014    Procedure: ESOPHAGOGASTRODUODENOSCOPY (EGD);  Surgeon: Winfield Cunas., MD;  Location: [facility];  Service: Endoscopy;  Laterality: N/A;  . Colonoscopy N/A 07/10/2014     Procedure: COLONOSCOPY;  Surgeon: Lear Ng, MD;  Location: [facility];  Service: Endoscopy;  Laterality: N/A;  . Total abdominal hysterectomy      w/BSO  . Dilation and curettage of uterus    . Breast biopsy Right 2009  . Breast lumpectomy Right 2009  . Fracture surgery    . Cataract extraction w/ intraocular lens  implant, bilateral Bilateral    Social History:  reports that she has never smoked. She has never used smokeless tobacco. She reports that she does not drink alcohol or use illicit drugs.  Allergies  Allergen Reactions  . Amlodipine Swelling    Currently taking exforge, separate med-Amlodipiine caused lower leg edema  . Contrast Media [Iodinated Diagnostic Agents] Nausea Only and Other (See Comments)    Patient experienced chills, nausea, hypothermic symptoms, also weird feelings  . Hctz [Hydrochlorothiazide] Other (See Comments)    Severe hypotension  . Hydralazine Other (See Comments)    Severe hypotension-suddenly and drastically dropped BP  . Metoprolol Other (See Comments)    Severe hypotension-suddenly and drastically dropped BP, same as hydralazine  . Codeine Other (See Comments)    Makes her feel "crazy."  . Demerol Other (See Comments)    Makes her feel "crazy."  . Librium Other (See Comments)    Makes her feel "crazy."  . Lipitor [Atorvastatin Calcium] Other (See Comments)    Makes her feel "crazy."    Family History:  Family History  Problem Relation Age of Onset  . Hypertension Mother   . Heart attack Father   . Hypertension Father   .  Diabetes Sister   . Hypertension Sister   . Liver cancer Sister   . Heart attack Brother   . Hypertension Brother   . Diabetes Brother   . Diabetes Sister   . Hypertension Sister      Prior to Admission medications   Medication Sig Start Date End Date Taking? Authorizing Provider  alum & mag hydroxide-simeth (MAALOX/MYLANTA) 200-200-20 MG/5ML suspension Take 30 mLs by mouth every 6 (six) hours  as needed for indigestion or heartburn (dyspepsia). 09/02/15  Yes Maryann Mikhail, DO  amLODipine (NORVASC) 10 MG tablet Take 1 tablet (10 mg total) by mouth daily. 08/19/15  Yes Donzetta Starch, NP  anastrozole (ARIMIDEX) 1 MG tablet TAKE 1 TABLET BY MOUTH EVERY DAY 02/28/15  Yes Ladell Pier, MD  atenolol (TENORMIN) 25 MG tablet Take 1 tablet (25 mg total) by mouth 3 (three) times daily. 09/02/15  Yes Maryann Mikhail, DO  clopidogrel (PLAVIX) 75 MG tablet Take 1 tablet (75 mg total) by mouth daily. 09/02/15  Yes Maryann Mikhail, DO  ezetimibe (ZETIA) 10 MG tablet Take 1 tablet (10 mg total) by mouth daily. 09/07/14  Yes Darlin Coco, MD  ferrous sulfate 325 (65 FE) MG tablet Take 325 mg by mouth daily with breakfast.   Yes Historical Provider, MD  guaiFENesin-dextromethorphan (ROBITUSSIN DM) 100-10 MG/5ML syrup Take 15 mLs by mouth every 4 (four) hours as needed for cough.   Yes Historical Provider, MD  Insulin Detemir (LEVEMIR FLEXTOUCH) 100 UNIT/ML Pen Inject 10 Units into the skin daily. At noon   Yes Historical Provider, MD  irbesartan (AVAPRO) 300 MG tablet Take 1 tablet (300 mg total) by mouth daily. 08/19/15  Yes Donzetta Starch, NP  levothyroxine (SYNTHROID, LEVOTHROID) 75 MCG tablet Take 75 mcg by mouth daily before breakfast.    Yes Historical Provider, MD  NUTRITIONAL SUPPLEMENT LIQD Take 1 Dose by mouth 2 (two) times daily with a meal. [facility] Shake with lunch and dinner   Yes Historical Provider, MD  polyethylene glycol (MIRALAX / GLYCOLAX) packet Take 17 g by mouth daily as needed for mild constipation. 09/02/15  Yes Maryann Mikhail, DO  simethicone (MYLICON) 40 [id number] drops Take 40 mg by mouth 3 (three) times daily as needed for flatulence.   Yes Historical Provider, MD  vitamin B-12 500 MCG tablet Take 1 tablet (500 mcg total) by mouth daily with lunch. 09/02/15  Yes Maryann Mikhail, DO  ACCU-CHEK SMARTVIEW test strip  06/13/15   Historical Provider, MD   Physical Exam: Filed  Vitals:   01/26/16 0834 01/26/16 1041 01/26/16 1100 01/26/16 1102  BP: 176/81  164/67 164/67  Pulse: 76  68 68  Temp: 99.1 F (37.3 C)     TempSrc: Oral     Resp: 20   18  Weight:  69.854 kg (154 lb)    SpO2: 94%  94% 94%    Physical Exam  Constitutional: Appears well-developed and well-nourished. No distress.  HENT: Normocephalic. No tonsillar erythema or exudates Eyes: Conjunctivae are normal. No scleral icterus.  Neck: Normal ROM. Neck supple. No JVD. No tracheal deviation. No thyromegaly.  CVS: RRR, S1/S2 appreciated. Appreciate SEM Pulmonary: Effort and breath sounds normal, no stridor, rhonchi, wheezes, rales.  Abdominal: Soft. BS +,  no distension, tenderness, rebound or guarding.  Musculoskeletal: Normal range of motion. No edema and no tenderness.  Lymphadenopathy: No lymphadenopathy noted, cervical, inguinal. Neuro: Alert. Right-sided hemiparesis Skin: Skin is warm and dry. No rash noted.  No erythema. No pallor.  Psychiatric: Normal mood and affect. Behavior, judgment, thought content normal.   Labs on Admission:  Basic Metabolic Panel:  Recent Labs Lab 01/26/16 0910  NA 130*  K 3.7  CL 98*  CO2 22  GLUCOSE 197*  BUN 16  CREATININE 0.57  CALCIUM 8.8*   Liver Function Tests:  Recent Labs Lab 01/26/16 0910  AST 11*  ALT 10*  ALKPHOS 79  BILITOT 0.8  PROT 6.0*  ALBUMIN 3.0*   No results for input(s): LIPASE, AMYLASE in the last 168 hours. No results for input(s): AMMONIA in the last 168 hours. CBC:  Recent Labs Lab 01/26/16 0910  WBC 33.1*  NEUTROABS 16.9*  HGB 13.1  HCT 39.0  MCV 90.7  PLT 353   Cardiac Enzymes:  Recent Labs Lab 01/26/16 0910  TROPONINI <0.03   BNP: Invalid input(s): POCBNP CBG: No results for input(s): GLUCAP in the last 168 hours.  If 7PM-7AM, please contact night-coverage [email or web address] Password [email or web address] 01/26/2016, 12:10 PM

## 2016-01-26 NOTE — ED Notes (Signed)
Bed: [id number] Expected date:  Expected time:  Means of arrival:  Comments: EMS-cough

## 2016-01-26 NOTE — ED Provider Notes (Signed)
CSN: [id number]     Arrival date & time 01/26/16  [id number] History   First MD Initiated Contact with Patient 01/26/16 [phone]     Chief Complaint  Patient presents with  . Cough  . Abdominal Pain      HPI   Patient presents with concern of cough, weakness. Patient has history of prior stroke, some speech difficulty, but seems to answer questions appropriately. She states that she has felt unwell for at least 2 weeks, worse over the past few days. She cannot specify if she has had fever, but states that she is weak, with persistent cough in spite of multiple medication. She denies chest pain, belly pain. She denies new weakness asymmetrically, does describe generalized symptoms.    10:29 AM Patient's daughter is here.  She adds that the patient has used robitussion and Dimetap w no relief.    Past Medical History  Diagnosis Date  . Labile hypertension   . Aortic stenosis     a. Mild by echo 07/2015.  Marland Kitchen Hypothyroidism   . Anemia   . Hypercholesterolemia   . Heart murmur   . Type II diabetes mellitus ([place])   . Arthritis     "back; knees, hands" (05/11/2015)  . Breast cancer, right breast ([place])     "cells went into the blood and caused her hip to break" (05/11/2015)  . Stroke (cerebrum) ([place])     a. Stroke 07/2015 - received TPA and placed on DAPT; admit c/b diverticular bleed, strokes felt 2/2 hypoperfusion in setting of low BP with severe intracranial vascular disease.  . Intracranial vascular stenosis     a. severe bilateral intracranial vascular stenosis.  Marland Kitchen History of GI diverticular bleed 07/2015   Past Surgical History  Procedure Laterality Date  . Hemiarthroplasty hip Right     "partial replacement"  . Pelvic and para-aortic lymph node dissection    . Tonsillectomy    . Appendectomy    . Cholecystectomy    . US echocardiography  03/30/2009    EF 55-60%  . Cardiovascular stress test  01/28/2008    EF 74%  . Esophagogastroduodenoscopy N/A 07/08/2014    Procedure:  ESOPHAGOGASTRODUODENOSCOPY (EGD);  Surgeon: Winfield Cunas., MD;  Location: [facility];  Service: Endoscopy;  Laterality: N/A;  . Colonoscopy N/A 07/10/2014    Procedure: COLONOSCOPY;  Surgeon: Lear Ng, MD;  Location: [facility];  Service: Endoscopy;  Laterality: N/A;  . Total abdominal hysterectomy      w/BSO  . Dilation and curettage of uterus    . Breast biopsy Right 2009  . Breast lumpectomy Right 2009  . Fracture surgery    . Cataract extraction w/ intraocular lens  implant, bilateral Bilateral    Family History  Problem Relation Age of Onset  . Hypertension Mother   . Heart attack Father   . Hypertension Father   . Diabetes Sister   . Hypertension Sister   . Liver cancer Sister   . Heart attack Brother   . Hypertension Brother   . Diabetes Brother   . Diabetes Sister   . Hypertension Sister    Social History  Substance Use Topics  . Smoking status: Never Smoker   . Smokeless tobacco: Never Used  . Alcohol Use: No   OB History    No data available     Review of Systems  Constitutional:       Per HPI, otherwise negative  HENT:       Per  HPI, otherwise negative  Respiratory:       Per HPI, otherwise negative  Cardiovascular:       Per HPI, otherwise negative  Gastrointestinal: Negative for vomiting.  Endocrine:       Negative aside from HPI  Genitourinary:       Neg aside from HPI   Musculoskeletal:       Per HPI, otherwise negative  Skin: Negative.   Neurological: Positive for facial asymmetry, speech difficulty, weakness and numbness.      Allergies  Amlodipine; Contrast media; Hctz; Hydralazine; Metoprolol; Codeine; Demerol; Librium; and Lipitor  Home Medications   Prior to Admission medications   Medication Sig Start Date End Date Taking? Authorizing Provider  ACCU-CHEK SMARTVIEW test strip  06/13/15   Historical Provider, MD  alum & mag hydroxide-simeth (MAALOX/MYLANTA) 200-200-20 MG/5ML suspension Take 30 mLs by mouth every 6  (six) hours as needed for indigestion or heartburn (dyspepsia). 09/02/15   Maryann Mikhail, DO  amLODipine (NORVASC) 10 MG tablet Take 1 tablet (10 mg total) by mouth daily. 08/19/15   Donzetta Starch, NP  anastrozole (ARIMIDEX) 1 MG tablet TAKE 1 TABLET BY MOUTH EVERY DAY 02/28/15   Ladell Pier, MD  atenolol (TENORMIN) 25 MG tablet Take 1 tablet (25 mg total) by mouth 3 (three) times daily. 09/02/15   Maryann Mikhail, DO  Calcium Carbonate-Vitamin D (CALCIUM + D PO) Take 1 tablet by mouth daily after lunch.     Historical Provider, MD  clopidogrel (PLAVIX) 75 MG tablet Take 1 tablet (75 mg total) by mouth daily. 09/02/15   Maryann Mikhail, DO  ezetimibe (ZETIA) 10 MG tablet Take 1 tablet (10 mg total) by mouth daily. 09/07/14   Darlin Coco, MD  ferrous sulfate 325 (65 FE) MG tablet Take 325 mg by mouth daily with breakfast.    Historical Provider, MD  insulin glargine (LANTUS) 100 UNIT/ML injection Inject 0.1 mLs (10 Units total) into the skin daily at 12 noon. Patient taking differently: Inject 18 Units into the skin daily at 12 noon.  09/02/15   Maryann Mikhail, DO  irbesartan (AVAPRO) 300 MG tablet Take 1 tablet (300 mg total) by mouth daily. 08/19/15   Donzetta Starch, NP  levothyroxine (SYNTHROID, LEVOTHROID) 75 MCG tablet Take 75 mcg by mouth daily before breakfast.     Historical Provider, MD  nitrofurantoin, macrocrystal-monohydrate, (MACROBID) 100 MG capsule Take 100 mg by mouth 2 (two) times daily. Start on 10/10/15 for a seven day course    Historical Provider, MD  polyethylene glycol (MIRALAX / GLYCOLAX) packet Take 17 g by mouth daily as needed for mild constipation. 09/02/15   Maryann Mikhail, DO  vitamin B-12 500 MCG tablet Take 1 tablet (500 mcg total) by mouth daily with lunch. 09/02/15   Maryann Mikhail, DO   BP 176/81 mmHg  Pulse 76  Temp(Src) 99.1 F (37.3 C) (Oral)  Resp 20  SpO2 94% Physical Exam  Constitutional: She is oriented to person, place, and time. She appears  well-developed and well-nourished. No distress.  HENT:  Head: Normocephalic and atraumatic.  Eyes: Conjunctivae and EOM are normal.  Cardiovascular: Normal rate and regular rhythm.   Pulmonary/Chest: No stridor. She has decreased breath sounds. She has no wheezes.  Abdominal: She exhibits no distension. There is no tenderness.  Musculoskeletal: She exhibits no edema.  Neurological: She is alert and oriented to person, place, and time. She displays atrophy. She displays no tremor. A cranial nerve deficit is present. She exhibits abnormal  muscle tone. She displays no seizure activity. Coordination abnormal.  The patient and daughter deny changes, but the patient has a right-sided hemiplegia, right facial weakness  Skin: Skin is warm and dry.  Psychiatric: Her speech is tangential.  Repetitive  Nursing note and vitals reviewed.   ED Course  Procedures (including critical care time) Labs Review Labs Reviewed  COMPREHENSIVE METABOLIC PANEL - Abnormal; Notable for the following:    Sodium 130 (*)    Chloride 98 (*)    Glucose, Bld 197 (*)    Calcium 8.8 (*)    Total Protein 6.0 (*)    Albumin 3.0 (*)    AST 11 (*)    ALT 10 (*)    All other components within normal limits  BRAIN NATRIURETIC PEPTIDE - Abnormal; Notable for the following:    B Natriuretic Peptide 140.0 (*)    All other components within normal limits  CBC WITH DIFFERENTIAL/PLATELET - Abnormal; Notable for the following:    WBC 33.1 (*)    Neutro Abs 16.9 (*)    Lymphs Abs 14.2 (*)    Monocytes Absolute 2.0 (*)    All other components within normal limits  URINALYSIS, ROUTINE W REFLEX MICROSCOPIC (NOT AT [facility]) - Abnormal; Notable for the following:    Color, Urine AMBER (*)    APPearance CLOUDY (*)    Protein, ur 30 (*)    Nitrite POSITIVE (*)    Leukocytes, UA MODERATE (*)    All other components within normal limits  URINE MICROSCOPIC-ADD ON - Abnormal; Notable for the following:    Squamous Epithelial / LPF  0-5 (*)    Bacteria, UA MANY (*)    All other components within normal limits  TROPONIN I    Imaging Review Dg Chest 2 View  01/26/2016  CLINICAL DATA:  Increased cough and weakness over past few days, history hypertension, type II diabetes mellitus, breast cancer, stroke EXAM: CHEST  2 VIEW COMPARISON:  08/29/2015 FINDINGS: Borderline enlargement of cardiac silhouette. Atherosclerotic calcification aorta. Mediastinal contours and pulmonary vascularity normal. LEFT lower lobe consolidation consistent with pneumonia. Lungs otherwise clear though mildly hyperinflated. No pleural effusion or pneumothorax. IMPRESSION: Question COPD with LEFT lower lobe consolidation consistent with pneumonia. Electronically Signed   By: Lavonia Dana M.D.   On: 01/26/2016 10:00   I have personally reviewed and evaluated these images and lab results as part of my medical decision-making.  10:34 AM Results discussed with the patient and her daughter. With concern for both pneumonia and urinary tract infection, antibiotics were started, cefepime, vancomycin as she requires healthcare associated pneumonia medication  MDM  Elderly female presents from her nursing facility with concern for weakness, cough. Here, the patient is then have both pneumonia and urinary tract infection Patient is hemodynamically stable, tolerated initiation of therapy well. However, given concern for age, comorbidities, need for intravenous and about, she was admitted for further evaluation and management.    Carmin Muskrat, MD 01/26/16 [phone]

## 2016-01-26 NOTE — Progress Notes (Signed)
Pharmacy Antibiotic Note  Molly [name] is a 82 y.o. female admitted on 01/26/2016 with pneumonia.  Pharmacy has been consulted for Vancomycin dosing and to renally adjust Cefepime.  Today, 01/26/2016: Temp: afebrile WBC: markedly elevated Renal: SCr wnl; CrCl 45 CG  Plan:  Vancomycin 1250 mg IV now, then 1000 mg IV q24 hr; goal trough 15-20 mcg/mL, for 8 days  Measure vancomycin trough levels at steady state as indicated  Cefepime 1g IV q12 hr     Temp (24hrs), Avg:99.1 F (37.3 C), Min:99.1 F (37.3 C), Max:99.1 F (37.3 C)   Recent Labs Lab 01/26/16 0910  WBC 33.1*  CREATININE 0.57    CrCl cannot be calculated (Unknown ideal weight.).    Allergies  Allergen Reactions  . Amlodipine Swelling    Currently taking exforge, separate med-Amlodipiine caused lower leg edema  . Contrast Media [Iodinated Diagnostic Agents] Nausea Only and Other (See Comments)    Patient experienced chills, nausea, hypothermic symptoms, also weird feelings  . Hctz [Hydrochlorothiazide] Other (See Comments)    Severe hypotension  . Hydralazine Other (See Comments)    Severe hypotension-suddenly and drastically dropped BP  . Metoprolol Other (See Comments)    Severe hypotension-suddenly and drastically dropped BP, same as hydralazine  . Codeine Other (See Comments)    Makes her feel "crazy."  . Demerol Other (See Comments)    Makes her feel "crazy."  . Librium Other (See Comments)    Makes her feel "crazy."  . Lipitor [Atorvastatin Calcium] Other (See Comments)    Makes her feel "crazy."    Antimicrobials this admission: Vancomycin 3/16 >>  Cefepime 3/16 >>   Dose adjustments this admission: ---  Microbiology results: 3/16 BCx: need collect 3/16 Sputum: need collect  3/16 MRSA PCR:  3/16 S. pneumo UAg: IP 3/16 Legionella UAg: IP 3/16 Influenza:  3/16 Resp virus:  Hx pseudomonas (I gent), E coli (R cipro), K pneumo (R amp) in urine  Thank you for allowing pharmacy to be a  part of this patient's care.  Reuel Boom, PharmD, BCPS Pager: [phone] 01/26/2016, 10:38 AM

## 2016-01-27 DIAGNOSIS — L8990 Pressure ulcer of unspecified site, unspecified stage: Secondary | ICD-10-CM | POA: Insufficient documentation

## 2016-01-27 LAB — GLUCOSE, CAPILLARY
GLUCOSE-CAPILLARY: 159 mg/dL — AB (ref 65–99)
GLUCOSE-CAPILLARY: 174 mg/dL — AB (ref 65–99)
Glucose-Capillary: 204 mg/dL — ABNORMAL HIGH (ref 65–99)
Glucose-Capillary: 200 mg/dL — ABNORMAL HIGH (ref 65–99)

## 2016-01-27 LAB — CBC
HEMATOCRIT: 32.6 % — AB (ref 36.0–46.0)
HEMOGLOBIN: 11.2 g/dL — AB (ref 12.0–15.0)
MCH: 29.9 pg (ref 26.0–34.0)
MCHC: 34.4 g/dL (ref 30.0–36.0)
MCV: 87.2 fL (ref 78.0–100.0)
Platelets: 317 10*3/uL (ref 150–400)
RBC: 3.74 MIL/uL — ABNORMAL LOW (ref 3.87–5.11)
RDW: 13.5 % (ref 11.5–15.5)
WBC: 31.7 10*3/uL — AB (ref 4.0–10.5)

## 2016-01-27 LAB — COMPREHENSIVE METABOLIC PANEL
ALT: 7 U/L — AB (ref 14–54)
ANION GAP: 7 (ref 5–15)
AST: 8 U/L — ABNORMAL LOW (ref 15–41)
Albumin: 2.5 g/dL — ABNORMAL LOW (ref 3.5–5.0)
Alkaline Phosphatase: 76 U/L (ref 38–126)
BUN: 19 mg/dL (ref 6–20)
CALCIUM: 8.4 mg/dL — AB (ref 8.9–10.3)
CO2: 21 mmol/L — ABNORMAL LOW (ref 22–32)
Chloride: 103 mmol/L (ref 101–111)
Creatinine, Ser: 0.62 mg/dL (ref 0.44–1.00)
GFR calc non Af Amer: >60 mL/min (ref >60)
Glucose, Bld: 162 mg/dL — ABNORMAL HIGH (ref 65–99)
POTASSIUM: 3.6 mmol/L (ref 3.5–5.1)
SODIUM: 131 mmol/L — AB (ref 135–145)
TOTAL PROTEIN: 5.4 g/dL — AB (ref 6.5–8.1)
Total Bilirubin: 0.6 mg/dL (ref 0.3–1.2)

## 2016-01-27 MED ORDER — GLUCERNA SHAKE PO LIQD
237.0000 mL | Freq: Three times a day (TID) | ORAL | Status: DC
  Administered 2016-01-28: 237 mL via ORAL
  Filled 2016-01-27 (×17): qty 237

## 2016-01-27 MED ORDER — BENZONATATE 100 MG PO CAPS
200.0000 mg | ORAL_CAPSULE | Freq: Two times a day (BID) | ORAL | Status: DC
  Administered 2016-01-27 – 2016-01-30 (×6): 200 mg via ORAL
  Filled 2016-01-27 (×7): qty 2

## 2016-01-27 MED ORDER — DM-GUAIFENESIN ER 30-600 MG PO TB12
1.0000 | ORAL_TABLET | Freq: Two times a day (BID) | ORAL | Status: DC
  Administered 2016-01-27 – 2016-01-28 (×4): 1 via ORAL
  Filled 2016-01-27 (×5): qty 1

## 2016-01-27 NOTE — NC FL2 (Signed)
[place] LEVEL OF CARE SCREENING TOOL     IDENTIFICATION  Patient Name: Molly [name] Birthdate: 1923/10/15 Sex: female Admission Date (Current Location): 01/26/2016  [facility] and [place] Number:  Herbalist and Address:  [facility], [place] [address], [place], [place] [address]      Provider Number: [id number]  Attending Physician Name and Address:  Robbie Lis, MD  Relative Name and Phone Number:       Current Level of Care: Hospital Recommended Level of Care: [place] Prior Approval Number:    Date Approved/Denied:   PASRR Number:    Discharge Plan: SNF    Current Diagnoses: Patient Active Problem List   Diagnosis Date Noted  . Pressure ulcer 01/27/2016  . HCAP (healthcare-associated pneumonia) 01/26/2016  . Leukocytosis 01/26/2016  . Left lower lobe pneumonia 01/26/2016  . UTI (lower urinary tract infection) 01/26/2016  . Controlled diabetes mellitus with circulatory complication, with long-term current use of insulin ([place]) 01/26/2016  . Hypothyroidism 01/26/2016  . Dyslipidemia associated with type 2 diabetes mellitus ([place]) 01/26/2016  . Generalized weakness 01/26/2016  . Cerebrovascular accident (CVA) due to stenosis of left carotid artery ([place]) 08/29/2015  . Essential hypertension 08/29/2015  . Breast cancer metastasized to bone ([place]) 08/02/2011    Orientation RESPIRATION BLADDER Height & Weight     Self, Time, Situation, Place  Normal Incontinent Weight: 154 lb (69.854 kg) Height:     BEHAVIORAL SYMPTOMS/MOOD NEUROLOGICAL BOWEL NUTRITION STATUS  Other (Comment) (no behaviors)   Incontinent Diet (Regular)  AMBULATORY STATUS COMMUNICATION OF NEEDS Skin   Extensive Assist Verbally PU Stage and Appropriate Care, Bruising (Deep Tissue Injury - Purple or maroon localized area of discolored intact skin or blood-filled blister due to damage of underlying soft tissue from pressure and/or  shear.) PU Stage 1 Dressing: No Dressing   PU Stage 3 Dressing: Daily (Full thickness tissue loss. Subcutaneous fat may be visible but bone, tendon or muscle are NOT exposed.)                 Personal Care Assistance Level of Assistance  Bathing, Feeding, Dressing Bathing Assistance: Maximum assistance Feeding assistance: Maximum assistance Dressing Assistance: Maximum assistance     Functional Limitations Info  Sight, Hearing, Speech Sight Info: Impaired Hearing Info: Impaired Speech Info: Adequate    SPECIAL CARE FACTORS FREQUENCY  PT (By licensed PT), OT (By licensed OT)     PT Frequency: requires total +2 assist for rolling in bed."               Contractures Contractures Info: Not present    Additional Factors Info  Allergies, Code Status, Psychotropic, Insulin Sliding Scale, Isolation Precautions Code Status Info: Full Code Allergies Info: Amlodipine, Contrast Media, Hctz, Hydralazine, Metoprolol, Codeine, Demerol, Librium, Lipitor Psychotropic Info: none Insulin Sliding Scale Info: 3x daily Isolation Precautions Info: droplet percautions     Current Medications (01/27/2016):  This is the current hospital active medication list Current Facility-Administered Medications  Medication Dose Route Frequency Provider Last Rate Last Dose  . 0.9 %  sodium chloride infusion   Intravenous Continuous Robbie Lis, MD 75 mL/hr at 01/27/16 0421    . alum & mag hydroxide-simeth (MAALOX/MYLANTA) 200-200-20 MG/5ML suspension 30 mL  30 mL Oral Q6H PRN Robbie Lis, MD      . amLODipine (NORVASC) tablet 10 mg  10 mg Oral Daily Robbie Lis, MD   10 mg at 01/27/16  1055  . anastrozole (ARIMIDEX) tablet 1 mg  1 mg Oral Daily Robbie Lis, MD   1 mg at 01/27/16 1058  . atenolol (TENORMIN) tablet 25 mg  25 mg Oral TID Robbie Lis, MD   25 mg at 01/27/16 1055  . benzonatate (TESSALON) capsule 200 mg  200 mg Oral BID Robbie Lis, MD   200 mg at 01/27/16 1059  . ceFEPIme  (MAXIPIME) 1 g in dextrose 5 % 50 mL IVPB  1 g Intravenous Q12H Drew A Wofford, RPH   1 g at 01/27/16 1100  . clopidogrel (PLAVIX) tablet 75 mg  75 mg Oral Daily Robbie Lis, MD   75 mg at 01/27/16 1056  . cyanocobalamin tablet 500 mcg  500 mcg Oral Daily Robbie Lis, MD   500 mcg at 01/27/16 1057  . dextromethorphan-guaiFENesin (MUCINEX DM) 30-600 MG per 12 hr tablet 1 tablet  1 tablet Oral BID Robbie Lis, MD   1 tablet at 01/27/16 1056  . ezetimibe (ZETIA) tablet 10 mg  10 mg Oral Daily Robbie Lis, MD   10 mg at 01/27/16 1056  . feeding supplement (ENSURE ENLIVE) (ENSURE ENLIVE) liquid 237 mL  237 mL Oral BID BM Robbie Lis, MD   237 mL at 01/27/16 1100  . guaiFENesin-dextromethorphan (ROBITUSSIN DM) 100-10 MG/5ML syrup 5 mL  5 mL Oral Q4H PRN Robbie Lis, MD      . insulin aspart (novoLOG) injection 0-9 Units  0-9 Units Subcutaneous TID WC Robbie Lis, MD   2 Units at 01/27/16 0845  . insulin detemir (LEVEMIR) injection 10 Units  10 Units Subcutaneous Q1200 Robbie Lis, MD   10 Units at 01/26/16 1524  . levothyroxine (SYNTHROID, LEVOTHROID) tablet 75 mcg  75 mcg Oral QAC breakfast Robbie Lis, MD   75 mcg at 01/27/16 0845  . polyethylene glycol (MIRALAX / GLYCOLAX) packet 17 g  17 g Oral Daily PRN Robbie Lis, MD      . vancomycin (VANCOCIN) IVPB 1000 mg/200 mL premix  1,000 mg Intravenous Q24H Polly Cobia, RPH   1,000 mg at 01/27/16 1100     Discharge Medications: Please see discharge summary for a list of discharge medications.  Relevant Imaging Results:  Relevant Lab Results:   Additional Information    Lilly Cove, LCSW

## 2016-01-27 NOTE — Consult Note (Signed)
WOC wound consult note Reason for Consult:Deep Tissue injury to Coccyx.  Present on admission.  Wound type:Deep tissue pressure injury Pressure Ulcer POA: Yes Measurement:0.5 cm x 1 cm maroon discoloration to wound bed surrounded by 2 cm blanchable erythema circumferentially Wound bed: Intact marron discoloration Drainage (amount, consistency, odor) None noted Periwound:Blanchable erythema Dressing procedure/placement/frequency:Silicone border foam to sacrococcygeal area.  Change every 3 days and PRN soilage.  Will not follow at this time.  Please re-consult if needed.  Domenic Moras RN BSN [place] Pager [phone]

## 2016-01-27 NOTE — Progress Notes (Addendum)
Patient ID: Molly [name], female   DOB: 12/03/1922, 82 y.o.   MRN: [id number] [facility] PROGRESS NOTE  JAEYA REGEL [id number] DOB: [birth year] DOA: 01/26/2016 PCP: Antony Blackbird, MD  Brief narrative:    82 year old female with past medical history of recurrent UTIs, breast cancer with bone metastasis, history of CVA with residual right hemiparesis, diabetes mellitus on insulin, dyslipidemia, hypothyroidism, hypertension who presented from skilled nursing facility where reports of generalized weakness and cough intermittently productive of clear sputum for past 2 weeks prior to this admission. Patient reports having taken Mucinex in skilled nursing facility but without significant symptomatic relief. No fevers.  In ED, patient was hemodynamically stable, afebrile. White blood cell count was 33.1, normal creatinine, normal troponin level. Chest x-ray showed left lower lobe consolidation consistent with pneumonia. She was also found to have UTI. She was started on vanco and cefepime.   Assessment/Plan:    Principal Problem:  Generalized weakness - Likely in the setting of pneumonia and UTI - Obtain physical therapy for evaluation of weakness - Continue treatment for pneumonia and UTI with vancomycin and cefepime - She will most likely return to skilled nursing facility once medically stable  Active Problems:  HCAP (healthcare-associated pneumonia) / Left lower lobe pneumonia / Leukocytosis - Influenza negative.  Strep pneumonia negative. HIV is nonreactive - Blood cultures and Legionella are pending - Respiratory virus panel is pending - Continue vancomycin and cefepime until final cultures available - Stable respiratory status   UTI (lower urinary tract infection) - Urinalysis showed moderate leukocytes, positive nitrites and many bacteria - Urine culture is pending - Continue current antibiotics   Breast cancer metastasized to bone (HCC) - Continue Arimidex     Cerebrovascular accident (CVA) due to stenosis of left carotid artery ([facility]) - Stable, with residual right sided hemiparesis - Continue Plavix    Essential hypertension - Continue Norvasc 10 mg daily   Controlled diabetes mellitus with circulatory complication, with long-term current use of insulin ([facility]) - Continue Levemir 10 units daily along with sliding scale insulin   Hypothyroidism - Continue Synthroid   Dyslipidemia associated with type 2 diabetes mellitus (H[facility]) - Continue zetia    Sacral pressure ulcer - Wound care consulted  DVT prophylaxis:  - SCDs bilaterally in hospital    Code Status: DNR/DNI Family Communication: Plan of care discussed with the patient and her daughter at the bedside Disposition Plan: to SNF by 3/20  IV access:  Peripheral IV  Procedures and diagnostic studies:    Dg Chest 2 View 01/26/2016 Question COPD with LEFT lower lobe consolidation consistent with pneumonia. Electronically Signed By: Lavonia Dana M.D. On: 01/26/2016 10:00   Medical Consultants:  None  Other Consultants:  WOC PT  IAnti-Infectives:   Vanco and cefepime 01/26/2016 -->   Leisa Lenz, MD  [facility] Pager [phone]  Time spent in minutes: 25 minutes  If 7PM-7AM, please contact night-coverage [email or web address] Password [email or web address] 01/27/2016, 6:59 AM   LOS: 1 day    HPI/Subjective: No acute overnight events. Patient reports still coughing.  Objective: Filed Vitals:   01/26/16 1102 01/26/16 1328 01/26/16 2131 01/27/16 0505  BP: 164/67 158/56 150/46 158/51  Pulse: 68 70 73 70  Temp:  98 F (36.7 C) 99.2 F (37.3 C) 99.4 F (37.4 C)  TempSrc:  Oral Oral Oral  Resp: 18 18 18 18   Weight:      SpO2: 94% 94% 92% 92%    Intake/Output Summary (Last 24 hours) at 01/27/16 0659 Last  data filed at 01/26/16 1700  Gross per 24 hour  Intake    480 ml  Output      0 ml  Net    480 ml    Exam:   General:  Pt is alert, follows commands  appropriately, not in acute distress  Cardiovascular: Regular rate and rhythm, S1/S2 appreciated, SEM appreciated   Respiratory: Clear to auscultation bilaterally, no wheezing, no crackles, no rhonchi  Abdomen: Soft, non tender, non distended, bowel sounds present  Extremities: No edema, pulses palpable bilaterally  Neuro: Grossly nonfocal  Data Reviewed: Basic Metabolic Panel:  Recent Labs Lab 01/26/16 0910 01/27/16 0343  NA 130* 131*  K 3.7 3.6  CL 98* 103  CO2 22 21*  GLUCOSE 197* 162*  BUN 16 19  CREATININE 0.57 0.62  CALCIUM 8.8* 8.4*   Liver Function Tests:  Recent Labs Lab 01/26/16 0910 01/27/16 0343  AST 11* 8*  ALT 10* 7*  ALKPHOS 79 76  BILITOT 0.8 0.6  PROT 6.0* 5.4*  ALBUMIN 3.0* 2.5*   No results for input(s): LIPASE, AMYLASE in the last 168 hours. No results for input(s): AMMONIA in the last 168 hours. CBC:  Recent Labs Lab 01/26/16 0910 01/26/16 1452 01/27/16 0343  WBC 33.1* 37.0* 31.7*  NEUTROABS 16.9* 21.0*  --   HGB 13.1 12.7 11.2*  HCT 39.0 38.6 32.6*  MCV 90.7 91.3 87.2  PLT 353 359 317   Cardiac Enzymes:  Recent Labs Lab 01/26/16 0910  TROPONINI <0.03   BNP: Invalid input(s): POCBNP CBG:  Recent Labs Lab 01/26/16 1614 01/26/16 2203  GLUCAP 220* 167*    No results found for this or any previous visit (from the past 240 hour(s)).   Scheduled Meds: . amLODipine  10 mg Oral Daily  . anastrozole  1 mg Oral Daily  . atenolol  25 mg Oral TID  . ceFEPime (MAXIPIME) IV  1 g Intravenous Q12H  . clopidogrel  75 mg Oral Daily  . vitamin B-12  500 mcg Oral Daily  . ezetimibe  10 mg Oral Daily  . feeding supplement (ENSURE ENLIVE)  237 mL Oral BID BM  . insulin aspart  0-9 Units Subcutaneous TID WC  . insulin detemir  10 Units Subcutaneous Q1200  . levothyroxine  75 mcg Oral QAC breakfast  . vancomycin  1,000 mg Intravenous Q24H   Continuous Infusions: . sodium chloride 75 mL/hr at 01/27/16 0421

## 2016-01-27 NOTE — Clinical Social Work Note (Addendum)
Clinical Social Work Assessment  Patient Details  Name: Molly [name] MRN: [id number] Date of Birth: 1923/05/30  Date of referral:  01/27/16               Reason for consult:   (from [facility])                Permission sought to share information with:  Case Manager, Customer service manager, Family Supports Permission granted to share information::  Yes, Verbal Permission Granted  Name::        Agency::  [facility]  Relationship::  Daughter   Contact Information:     Housing/Transportation Living arrangements for the past 2 months:  [place] of Information:  Scientist, water quality, Facility Patient Interpreter Needed:  None Criminal Activity/Legal Involvement Pertinent to Current Situation/Hospitalization:  No - Comment as needed Significant Relationships:  Adult Children, Warehouse manager, Other Family Members Lives with:  Facility Resident Do you feel safe going back to the place where you live?  Yes (bed hold) Need for family participation in patient care:  Yes (Comment) (daughter helping making decisions)  Care giving concerns:  No concerns noted at this time.  Patient is a LTC facility patient and daughter involved in care.  Patient will dc back to NH as they completed a bed hold.   Social Worker assessment / plan:   Updated FL2 Completed clinicals to facility. Discussed care with facility. Call placed to daughter, message left. (Spoke with daughter)  Daughter requests more information about Private Duty sitters in assistance with care with pt at facility.  Information given, but dtr reports she is unable financially do pay out of pocket at this time. Return to facility when medically stable for DC  Employment status:  Retired Nurse, adult PT Recommendations:  [place] / Referral to community resources:  Other (Comment Required) (none noted)  Patient/Family's Response to care:   Agreeable to plan as family has already been in contact with facility  Patient/Family's Understanding of and Emotional Response to Diagnosis, Current Treatment, and Prognosis:  Will complete once call returned. Spoke with daughter who reports she is physically exhausted with caring for mother.  Reports she stays with patient during the day at the facility.  She is very understanding of prognosis and treatment. Emotional Assessment Appearance:  Appears stated age Attitude/Demeanor/Rapport:  Other (calm and cooperative) Affect (typically observed):  Accepting, Adaptable Orientation:  Oriented to Self, Oriented to Situation Alcohol / Substance use:  Not Applicable Psych involvement (Current and /or in the community):  No (Comment)  Discharge Needs  Concerns to be addressed:  No discharge needs identified Readmission within the last 30 days:  No Current discharge risk:  None Barriers to Discharge:  No Barriers Identified   Molly Cove, LCSW 01/27/2016, 12:17 PM

## 2016-01-27 NOTE — Progress Notes (Signed)
PT Cancellation Note / Screen  Patient Details Name: Molly [name] MRN: [id number] DOB: 05/03/1923   Cancelled Treatment:    Reason Eval/Treat Not Completed: PT screened, no needs identified, will sign off Per PT evaluation 08/30/15: pt at "her baseline level of function and currently requires total +2 assist for rolling in bed."  Pt is from SNF and will return to SNF.  Pt not appropriate for acute skilled PT at this time.   Mikeria Valin,KATHrine E 01/27/2016, 8:39 AM Carmelia Bake, PT, DPT 01/27/2016 Pager: [phone]

## 2016-01-27 NOTE — Progress Notes (Signed)
Initial Nutrition Assessment  DOCUMENTATION CODES:   Severe malnutrition in context of chronic illness  INTERVENTION:   - Discontinue Ensure Enlive.  - Provide Glucerna Shake po TID, each supplement provides 220 kcals and 10 grams of protein.  - Will continue to monitor for nutritional needs.   NUTRITION DIAGNOSIS:   Malnutrition related to chronic illness as evidenced by percent weight loss, energy intake < or equal to 75% for > or equal to 1 month, mild depletion of body fat.  GOAL:   Patient will meet greater than or equal to 90% of their needs  MONITOR:   PO intake, Supplement acceptance, Labs, Weight trends, Skin  REASON FOR ASSESSMENT:   Malnutrition Screening Tool    ASSESSMENT:   82 year old female with PMH of recurrent UTIs, breast cancer w/ bone metastasis,CVA with residual right hemiparesis, DM on insulin, dyslipidemia, hypothyroidism, HTN who presented from SNF with reports of generalized weakness and cough intermittently productive of clear sputum for past 2 weeks prior to this admission.    Patient very agitated at time of visit.  Patient reports that she has a poor appetite, mostly due to the diet that they have her on at her nursing home.  States that she likes the hospital food much better and has been eating about half of her meals.  States she had an omelette and grits this am, 50% meal completion.  Per chart review, meal completions of 60% for lunch and dinner on 3/16.  Patient denies any nausea or vomiting.    Nutrition Focused Physical Exam was conducted.  Findings include mild fat depletion, no muscle depletion, and no edema.  Patient reports that she used to weigh around 210# but now weighs around 150#.  Chart weight history shows a 61# / 28% weight loss x 5 months, significant for the time frame.  Patient not currently meeting needs which are elevated due to Stage III Pressure Ulcer on sacrum.  Pt amenable to trying Glucerna Shakes TID to help meet  calorie and protein needs.  Medications reviewed.  Labs reviewed: CBGs elevated (159-220), sodium low (131).   Diet Order:  Diet regular Room service appropriate?: Yes; Fluid consistency:: Thin  Skin:  Wound (see comment) (Stage III PU on Sacrum)  Last BM:  3/16  Height:   Ht Readings from Last 1 Encounters:  01/27/16 5\' 4"  (1.626 m)    Weight:   Wt Readings from Last 1 Encounters:  01/27/16 154 lb (69.854 kg)    Ideal Body Weight:  54.5 kg  BMI:  Body mass index is 26.42 kg/(m^2).  Estimated Nutritional Needs:   Kcal:  1800-2000  Protein:  80-90 grams  Fluid:  1.8-2.0 L  EDUCATION NEEDS:   No education needs identified at this time  Veronda Prude, Dietetic Intern Pager: [phone]

## 2016-01-28 LAB — GLUCOSE, CAPILLARY
GLUCOSE-CAPILLARY: 186 mg/dL — AB (ref 65–99)
GLUCOSE-CAPILLARY: 103 mg/dL — AB (ref 65–99)
Glucose-Capillary: 188 mg/dL — ABNORMAL HIGH (ref 65–99)
Glucose-Capillary: 134 mg/dL — ABNORMAL HIGH (ref 65–99)

## 2016-01-28 LAB — HIV ANTIBODY (ROUTINE TESTING W REFLEX): HIV SCREEN 4TH GENERATION: NONREACTIVE

## 2016-01-28 MED ORDER — CHLORHEXIDINE GLUCONATE 0.12 % MT SOLN
15.0000 mL | Freq: Two times a day (BID) | OROMUCOSAL | Status: DC
  Administered 2016-01-28 – 2016-01-29 (×2): 15 mL via OROMUCOSAL
  Filled 2016-01-28 (×4): qty 15

## 2016-01-28 MED ORDER — ONDANSETRON HCL 4 MG/2ML IJ SOLN
4.0000 mg | Freq: Four times a day (QID) | INTRAMUSCULAR | Status: DC | PRN
  Administered 2016-01-28 – 2016-01-30 (×3): 4 mg via INTRAVENOUS
  Filled 2016-01-28 (×2): qty 2

## 2016-01-28 MED ORDER — ONDANSETRON HCL 4 MG/2ML IJ SOLN
INTRAMUSCULAR | Status: AC
  Filled 2016-01-28: qty 2

## 2016-01-28 MED ORDER — CETYLPYRIDINIUM CHLORIDE 0.05 % MT LIQD
7.0000 mL | Freq: Two times a day (BID) | OROMUCOSAL | Status: DC
  Administered 2016-01-30 – 2016-02-01 (×2): 7 mL via OROMUCOSAL

## 2016-01-28 NOTE — Progress Notes (Addendum)
Patient ID: Molly [name], female   DOB: 06/20/1923, 82 y.o.   MRN: [id number] [facility] PROGRESS NOTE  RIMAS ABDO [id number] DOB: 1923-06-28 DOA: 01/26/2016 PCP: Antony Blackbird, MD  Brief narrative:    82 year old female with past medical history of recurrent UTIs, breast cancer with bone metastasis, history of CVA with residual right hemiparesis, diabetes mellitus on insulin, dyslipidemia, hypothyroidism, hypertension who presented from skilled nursing facility where reports of generalized weakness and cough intermittently productive of clear sputum for past 2 weeks prior to this admission. Patient reports having taken Mucinex in skilled nursing facility but without significant symptomatic relief. No fevers.  In ED, patient was hemodynamically stable, afebrile. White blood cell count was 33.1, normal creatinine, normal troponin level. Chest x-ray showed left lower lobe consolidation consistent with pneumonia. She was also found to have UTI. She was started on vanco and cefepime.   Assessment/Plan:    Principal Problem:  Generalized weakness - Likely in the setting of pneumonia and UTI in addition to chronic illness related to history of CVA  - Will return to skilled nursing facility once medically stable  Active Problems:  HCAP (healthcare-associated pneumonia) / Left lower lobe pneumonia / Leukocytosis - Influenza negative.  Strep pneumonia negative. HIV is nonreactive - Blood cultures negative so far - Legionella is pending - Respiratory virus is pending - Other than cough respiratory status is stable. Cough has improved with Tessalon capsules and Robitussin - Continue vancomycin and cefepime until final blood culture and urine culture report is back   UTI (lower urinary tract infection) due to gram-negative rods - Urinalysis showed moderate leukocytes, positive nitrites and many bacteria - Urine culture is growing gram-negative rods, final culture pending -  Continue current antibiotics which will cover for gram-negative rods, specifically cefepime   Breast cancer metastasized to bone (HCC) - Continue Arimidex    Cerebrovascular accident (CVA) due to stenosis of left carotid artery (HCC) - Stable, with residual right sided hemiparesis - Continue Plavix    Essential hypertension - Continue Norvasc 10 mg daily - Continue atenolol 25 mg 3 times daily   Controlled diabetes mellitus with circulatory complication, with long-term current use of insulin (HCC) - Continue Levemir 10 units daily along with sliding scale insulin - CBGs in past 24 hours: 174, 200, 188   Hypothyroidism - Continue Synthroid   Dyslipidemia associated with type 2 diabetes mellitus ([place]) - Continue zetia    Sacral pressure ulcer - Wound care consulted - Patient has deep tissue injury to coccyx. Measurement:0.5 cm x 1 cm maroon discoloration to wound bed surrounded by 2 cm blanchable erythema circumferentially - Dressing procedure/placement/frequency:Silicone border foam to sacrococcygeal area. Change every 3 days and PRN soilage.     Severe protein calorie malnutrition - In the setting of chronic illness - Nutrition was consulted   DVT prophylaxis:  - SCDs bilaterally    Code Status: DNR/DNI Family Communication: Plan of care discussed with the patient and her daughter at the bedside Disposition Plan: to SNF by 3/20  IV access:  Peripheral IV  Procedures and diagnostic studies:    Dg Chest 2 View 01/26/2016 Question COPD with LEFT lower lobe consolidation consistent with pneumonia. Electronically Signed By: Lavonia Dana M.D. On: 01/26/2016 10:00   Medical Consultants:  None  Other Consultants:  WOC PT Nutrition   IAnti-Infectives:   Vanco and cefepime 01/26/2016 -->   Leisa Lenz, MD  [facility] Pager [phone]  Time spent in minutes: 25 minutes  If 7PM-7AM,  please contact night-coverage [email or web address] Password  [facility] 01/28/2016, 9:52 AM   LOS: 2 days    HPI/Subjective: No acute overnight events. Patient reports cough little better.   Objective: Filed Vitals:   01/27/16 1100 01/27/16 1327 01/27/16 2052 01/28/16 0534  BP:  150/50 176/47 181/59  Pulse:  76 72 74  Temp:  97.3 F (36.3 C) 98.4 F (36.9 C) 98.6 F (37 C)  TempSrc:  Oral Oral Oral  Resp:  16 18 18   Height: 5\' 4"  (1.626 m)     Weight: 69.854 kg (154 lb)     SpO2:  94% 93% 92%    Intake/Output Summary (Last 24 hours) at 01/28/16 0952 Last data filed at 01/27/16 1327  Gross per 24 hour  Intake    240 ml  Output      0 ml  Net    240 ml    Exam:   General:  Pt is alert, not in acute distress  Cardiovascular: Rate controlled, S1/S2 (+)  Respiratory: coarse sounds, no wheezing   Abdomen: (+) BS, non tender   Extremities: No swelling, palpable pulses   Neuro: Nonfocal  Data Reviewed: Basic Metabolic Panel:  Recent Labs Lab 01/26/16 0910 01/27/16 0343  NA 130* 131*  K 3.7 3.6  CL 98* 103  CO2 22 21*  GLUCOSE 197* 162*  BUN 16 19  CREATININE 0.57 0.62  CALCIUM 8.8* 8.4*   Liver Function Tests:  Recent Labs Lab 01/26/16 0910 01/27/16 0343  AST 11* 8*  ALT 10* 7*  ALKPHOS 79 76  BILITOT 0.8 0.6  PROT 6.0* 5.4*  ALBUMIN 3.0* 2.5*   No results for input(s): LIPASE, AMYLASE in the last 168 hours. No results for input(s): AMMONIA in the last 168 hours. CBC:  Recent Labs Lab 01/26/16 0910 01/26/16 1452 01/27/16 0343  WBC 33.1* 37.0* 31.7*  NEUTROABS 16.9* 21.0*  --   HGB 13.1 12.7 11.2*  HCT 39.0 38.6 32.6*  MCV 90.7 91.3 87.2  PLT 353 359 317   Cardiac Enzymes:  Recent Labs Lab 01/26/16 0910  TROPONINI <0.03   BNP: Invalid input(s): POCBNP CBG:  Recent Labs Lab 01/27/16 0752 01/27/16 1200 01/27/16 1644 01/27/16 2059 01/28/16 0741  GLUCAP 159* 204* 174* 200* 188*    Recent Results (from the past 240 hour(s))  Culture, blood (routine x 2) Call MD if unable to obtain  prior to antibiotics being given     Status: None (Preliminary result)   Collection Time: 01/26/16  2:52 PM  Result Value Ref Range Status   Specimen Description BLOOD LEFT HAND  Final   Special Requests IN PEDIATRIC BOTTLE 1CC  Final   Culture   Final    NO GROWTH < 24 HOURS Performed at [facility]    Report Status PENDING  Incomplete  Culture, blood (routine x 2) Call MD if unable to obtain prior to antibiotics being given     Status: None (Preliminary result)   Collection Time: 01/26/16  2:52 PM  Result Value Ref Range Status   Specimen Description BLOOD LEFT HAND  Final   Special Requests IN PEDIATRIC BOTTLE .5CC  Final   Culture   Final    NO GROWTH < 24 HOURS Performed at [facility]    Report Status PENDING  Incomplete  Culture, Urine     Status: None (Preliminary result)   Collection Time: 01/26/16  3:25 PM  Result Value Ref Range Status   Specimen Description URINE, CATHETERIZED  Final  Special Requests NONE  Final   Culture   Final    >=100,000 COLONIES/mL GRAM NEGATIVE RODS Performed at [facility]    Report Status PENDING  Incomplete     Scheduled Meds: . amLODipine  10 mg Oral Daily  . anastrozole  1 mg Oral Daily  . atenolol  25 mg Oral TID  . benzonatate  200 mg Oral BID  . ceFEPime (MAXIPIME) IV  1 g Intravenous Q12H  . clopidogrel  75 mg Oral Daily  . vitamin B-12  500 mcg Oral Daily  . dextromethorphan-guaiFENesin  1 tablet Oral BID  . ezetimibe  10 mg Oral Daily  . feeding supplement (GLUCERNA SHAKE)  237 mL Oral TID BM  . insulin aspart  0-9 Units Subcutaneous TID WC  . insulin detemir  10 Units Subcutaneous Q1200  . levothyroxine  75 mcg Oral QAC breakfast  . vancomycin  1,000 mg Intravenous Q24H

## 2016-01-29 ENCOUNTER — Inpatient Hospital Stay (HOSPITAL_COMMUNITY): Payer: Medicare Other

## 2016-01-29 DIAGNOSIS — B961 Klebsiella pneumoniae [K. pneumoniae] as the cause of diseases classified elsewhere: Secondary | ICD-10-CM

## 2016-01-29 DIAGNOSIS — Z7189 Other specified counseling: Secondary | ICD-10-CM

## 2016-01-29 DIAGNOSIS — J189 Pneumonia, unspecified organism: Principal | ICD-10-CM

## 2016-01-29 DIAGNOSIS — Z515 Encounter for palliative care: Secondary | ICD-10-CM

## 2016-01-29 DIAGNOSIS — B964 Proteus (mirabilis) (morganii) as the cause of diseases classified elsewhere: Secondary | ICD-10-CM

## 2016-01-29 DIAGNOSIS — J9601 Acute respiratory failure with hypoxia: Secondary | ICD-10-CM

## 2016-01-29 LAB — GLUCOSE, CAPILLARY
GLUCOSE-CAPILLARY: 182 mg/dL — AB (ref 65–99)
GLUCOSE-CAPILLARY: 124 mg/dL — AB (ref 65–99)
GLUCOSE-CAPILLARY: 138 mg/dL — AB (ref 65–99)
Glucose-Capillary: 152 mg/dL — ABNORMAL HIGH (ref 65–99)
Glucose-Capillary: 128 mg/dL — ABNORMAL HIGH (ref 65–99)

## 2016-01-29 LAB — URINE CULTURE

## 2016-01-29 MED ORDER — DEXTROSE 5 % IV SOLN
250.0000 mg | INTRAVENOUS | Status: DC
  Administered 2016-01-30 – 2016-01-31 (×2): 250 mg via INTRAVENOUS
  Filled 2016-01-29 (×3): qty 250

## 2016-01-29 MED ORDER — IPRATROPIUM-ALBUTEROL 0.5-2.5 (3) MG/3ML IN SOLN: 3.0000 mL | RESPIRATORY_TRACT | Status: DC | PRN

## 2016-01-29 MED ORDER — DEXTROSE 5 % IV SOLN
500.0000 mg | Freq: Once | INTRAVENOUS | Status: AC
  Administered 2016-01-29: 500 mg via INTRAVENOUS
  Filled 2016-01-29: qty 500

## 2016-01-29 MED ORDER — ACETAMINOPHEN 160 MG/5ML PO SOLN
650.0000 mg | Freq: Four times a day (QID) | ORAL | Status: DC | PRN
  Administered 2016-01-29 – 2016-01-30 (×2): 650 mg via ORAL
  Filled 2016-01-29 (×2): qty 20.3

## 2016-01-29 NOTE — Progress Notes (Signed)
Patient's oxygen saturation level was 86% on 2 liters. Oxygen increased to 3 L level increased to 88-89%. Respiratory therapist was called to evaluate patient and she was placed on a short mask 45% but oxygen level fluctuated between 89-0%. Mask was changed to a non-rebreather and level came up to 94%. Will continue to monitor oxygen level and patient's tolerance of mask.

## 2016-01-29 NOTE — Progress Notes (Signed)
Pharmacy Antibiotic Note  Molly [name] is a 82 y.o. female admitted on 01/26/2016 with pneumonia/HCAP.  Pharmacy has been consulted for Vancomycin dosing and to renally-adjust Cefepime.  Today, 01/29/2016: Temp: afebrile WBC: markedly elevated(as of 3/17) Renal: SCr wnl; CrCl 45 CG Klebsiella and Proteus in urine - likely covered by Cefepime.  Plan:  D4 of 7, cont Vancomycin 1g IV q24h; goal trough 15-20 mcg/mL.   D4 of 7, cont Cefepime 1g IV q12h. Measure Vanc trough before dose on 3/21. Follow up renal fxn, culture results, and clinical course.  Height: 5\' 4"  (162.6 cm) Weight: 154 lb (69.854 kg) IBW/kg (Calculated) : 54.7  Temp (24hrs), Avg:98.9 F (37.2 C), Min:98.1 F (36.7 C), Max:99.4 F (37.4 C)   Recent Labs Lab 01/26/16 0910 01/26/16 1452 01/27/16 0343  WBC 33.1* 37.0* 31.7*  CREATININE 0.57  --  0.62    Estimated Creatinine Clearance: 44.9 mL/min (by C-G formula based on Cr of 0.62).    Allergies  Allergen Reactions  . Amlodipine Swelling    Currently taking exforge, separate med-Amlodipiine caused lower leg edema  . Contrast Media [Iodinated Diagnostic Agents] Nausea Only and Other (See Comments)    Patient experienced chills, nausea, hypothermic symptoms, also weird feelings  . Hctz [Hydrochlorothiazide] Other (See Comments)    Severe hypotension  . Hydralazine Other (See Comments)    Severe hypotension-suddenly and drastically dropped BP  . Metoprolol Other (See Comments)    Severe hypotension-suddenly and drastically dropped BP, same as hydralazine  . Codeine Other (See Comments)    Makes her feel "crazy."  . Demerol Other (See Comments)    Makes her feel "crazy."  . Librium Other (See Comments)    Makes her feel "crazy."  . Lipitor [Atorvastatin Calcium] Other (See Comments)    Makes her feel "crazy."    Antimicrobials this admission: 3/16 >> Vanc >>  3/16 >> Cefepime >>   Dose adjustments this admission: ---  Microbiology  results: Hx pseudomonas (I gent), E coli (R cipro), K pneumo (R amp) in urine 3/16 BCx: ngtd 3/16 UCx: Klebsiella - pan-sens except amp. Proteus - pan-sens except nitro. 3/16 sputumCx: ordered 3/16 S. pneumo UAg: neg 3/16 Influenza PCR: neg 3/16 Resp virus: sent  Thank you for allowing pharmacy to be a part of this patient's care.  Romeo Rabon, PharmD, pager [phone]. 01/29/2016,11:44 AM.

## 2016-01-29 NOTE — Consult Note (Signed)
Name: Molly [name] MRN: [id number] DOB: 1923/06/17    ADMISSION DATE:  01/26/2016 CONSULTATION DATE:  01/29/2016  REFERRING MD :  Leisa Lenz, M.D. / Hospitalist  CHIEF COMPLAINT:  Acute Hypoxic Respiratory Failure  BRIEF PATIENT DESCRIPTION: 82 year old female who currently resides at a skilled nursing facility presented with cough productive of a clear phlegm for approximately 2 weeks. Denied any subjective fever, chills, or sweats on admission. Imaging revealed left lower lobe opacity consistent with pneumonia as well as leukocytosis. Empirically started on vancomycin & cefepime.  SIGNIFICANT EVENTS  3/16 - Admit  STUDIES:  Port CXR 3/19:  Personally reviewed by me. Significant worsening of the left lower and mid lung opacities when compared with previous imaging.  MICROBIOLOGY: Blood Ctx x2 3/16>>> Urine Ctx 3/16:  Proteus & Klebsiella  Influenza PCR 3/16:  Negative Respiratory Viral Panel PCR 3/16>>> HIV 3/17:  Negative  Urine Strep Ag 3/16:  Negative   ANTIBIOTICS: Cefepime 3/16>>> Vancomycin 3/16>>>  HISTORY OF PRESENT ILLNESS:  82 year old female currently living at a skilled nursing facility. Presents with a two-week history of cough along with leukocytosis. Started empirically on vancomycin & cefepime. Infectious workup thus far has been negative. Patient reports dyspnea and cough continuing to worsen. She reports cough remains nonproductive. Denies any fever, chills, or sweats. She does report a "tightness" in her chest but denies any frank chest pain. She did have nausea and vomiting yesterday but denies any abdominal pain. She reports poor oral intake. She denies any sore throat or sinus congestion. No recent travel. Patient does have somewhat altered mentation making an accurate history difficult.  PAST MEDICAL HISTORY :  Past Medical History  Diagnosis Date  . Labile hypertension   . Aortic stenosis     a. Mild by echo 07/2015.  Marland Kitchen Hypothyroidism   . Anemia    . Hypercholesterolemia   . Heart murmur   . Type II diabetes mellitus ([place])   . Arthritis     "back; knees, hands" (05/11/2015)  . Breast cancer, right breast ([place])     "cells went into the blood and caused her hip to break" (05/11/2015)  . Stroke (cerebrum) ([place])     a. Stroke 07/2015 - received TPA and placed on DAPT; admit c/b diverticular bleed, strokes felt 2/2 hypoperfusion in setting of low BP with severe intracranial vascular disease.  . Intracranial vascular stenosis     a. severe bilateral intracranial vascular stenosis.  Marland Kitchen History of GI diverticular bleed 07/2015   PAST SURGICAL HISTORY: Past Surgical History  Procedure Laterality Date  . Hemiarthroplasty hip Right     "partial replacement"  . Pelvic and para-aortic lymph node dissection    . Tonsillectomy    . Appendectomy    . Cholecystectomy    . US echocardiography  03/30/2009    EF 55-60%  . Cardiovascular stress test  01/28/2008    EF 74%  . Esophagogastroduodenoscopy N/A 07/08/2014    Procedure: ESOPHAGOGASTRODUODENOSCOPY (EGD);  Surgeon: Winfield Cunas., MD;  Location: [facility];  Service: Endoscopy;  Laterality: N/A;  . Colonoscopy N/A 07/10/2014    Procedure: COLONOSCOPY;  Surgeon: Lear Ng, MD;  Location: [facility];  Service: Endoscopy;  Laterality: N/A;  . Total abdominal hysterectomy      w/BSO  . Dilation and curettage of uterus    . Breast biopsy Right 2009  . Breast lumpectomy Right 2009  . Fracture surgery    . Cataract extraction w/ intraocular lens  implant, bilateral  Bilateral     Prior to Admission medications   Medication Sig Start Date End Date Taking? Authorizing Provider  alum & mag hydroxide-simeth (MAALOX/MYLANTA) 200-200-20 MG/5ML suspension Take 30 mLs by mouth every 6 (six) hours as needed for indigestion or heartburn (dyspepsia). 09/02/15  Yes Maryann Mikhail, DO  amLODipine (NORVASC) 10 MG tablet Take 1 tablet (10 mg total) by mouth daily. 08/19/15  Yes Donzetta Starch,  NP  anastrozole (ARIMIDEX) 1 MG tablet TAKE 1 TABLET BY MOUTH EVERY DAY 02/28/15  Yes Ladell Pier, MD  atenolol (TENORMIN) 25 MG tablet Take 1 tablet (25 mg total) by mouth 3 (three) times daily. 09/02/15  Yes Maryann Mikhail, DO  clopidogrel (PLAVIX) 75 MG tablet Take 1 tablet (75 mg total) by mouth daily. 09/02/15  Yes Maryann Mikhail, DO  ezetimibe (ZETIA) 10 MG tablet Take 1 tablet (10 mg total) by mouth daily. 09/07/14  Yes Darlin Coco, MD  ferrous sulfate 325 (65 FE) MG tablet Take 325 mg by mouth daily with breakfast.   Yes Historical Provider, MD  guaiFENesin-dextromethorphan (ROBITUSSIN DM) 100-10 MG/5ML syrup Take 15 mLs by mouth every 4 (four) hours as needed for cough.   Yes Historical Provider, MD  Insulin Detemir (LEVEMIR FLEXTOUCH) 100 UNIT/ML Pen Inject 10 Units into the skin daily. At noon   Yes Historical Provider, MD  irbesartan (AVAPRO) 300 MG tablet Take 1 tablet (300 mg total) by mouth daily. 08/19/15  Yes Donzetta Starch, NP  levothyroxine (SYNTHROID, LEVOTHROID) 75 MCG tablet Take 75 mcg by mouth daily before breakfast.    Yes Historical Provider, MD  NUTRITIONAL SUPPLEMENT LIQD Take 1 Dose by mouth 2 (two) times daily with a meal. [employer] [facility] Shake with lunch and dinner   Yes Historical Provider, MD  polyethylene glycol (MIRALAX / GLYCOLAX) packet Take 17 g by mouth daily as needed for mild constipation. 09/02/15  Yes Maryann Mikhail, DO  simethicone (MYLICON) 40 [id number] drops Take 40 mg by mouth 3 (three) times daily as needed for flatulence.   Yes Historical Provider, MD  vitamin B-12 500 MCG tablet Take 1 tablet (500 mcg total) by mouth daily with lunch. 09/02/15  Yes Maryann Mikhail, DO  ACCU-CHEK SMARTVIEW test strip  06/13/15   Historical Provider, MD   Allergies  Allergen Reactions  . Amlodipine Swelling    Currently taking exforge, separate med-Amlodipiine caused lower leg edema  . Contrast Media [Iodinated Diagnostic Agents] Nausea Only and Other (See Comments)     Patient experienced chills, nausea, hypothermic symptoms, also weird feelings  . Hctz [Hydrochlorothiazide] Other (See Comments)    Severe hypotension  . Hydralazine Other (See Comments)    Severe hypotension-suddenly and drastically dropped BP  . Metoprolol Other (See Comments)    Severe hypotension-suddenly and drastically dropped BP, same as hydralazine  . Codeine Other (See Comments)    Makes her feel "crazy."  . Demerol Other (See Comments)    Makes her feel "crazy."  . Librium Other (See Comments)    Makes her feel "crazy."  . Lipitor [Atorvastatin Calcium] Other (See Comments)    Makes her feel "crazy."    FAMILY HISTORY:  Family History  Problem Relation Age of Onset  . Hypertension Mother   . Heart attack Father   . Hypertension Father   . Diabetes Sister   . Hypertension Sister   . Liver cancer Sister   . Heart attack Brother   . Hypertension Brother   . Diabetes Brother   . Diabetes  Sister   . Hypertension Sister    SOCIAL HISTORY: Social History  Substance Use Topics  . Smoking status: Never Smoker   . Smokeless tobacco: Never Used  . Alcohol Use: No    REVIEW OF SYSTEMS:  Accurate review of systems cannot be obtained due to altered mentation.  SUBJECTIVE:   VITAL SIGNS: Temp:  [98.1 F (36.7 C)-99.4 F (37.4 C)] 98.1 F (36.7 C) (03/19 0508) Pulse Rate:  [64-73] 64 (03/19 0508) Resp:  [18-20] 18 (03/19 0508) BP: (153-163)/(47-59) 162/47 mmHg (03/19 0508) SpO2:  [86 %-95 %] 95 % (03/19 0650)  PHYSICAL EXAMINATION: General:  Awake. Alert. Daughter at bedside.  Integument:  Warm & dry. No rash on exposed skin.  Lymphatics:  No appreciated cervical or supraclavicular lymphadenoapthy. HEENT:  No scleral injection or icterus. Tacky mucus membranes.  Cardiovascular:  Regular rate. No edema. No appreciable JVD.  Pulmonary:  Decreased breath sounds in left lung base. Mildly increased work of breathing on NRB mask. Abdomen: Soft. Normal bowel  sounds. Nondistended. Grossly nontender. Musculoskeletal:  No joint deformity or effusion appreciated. Weak on her right side. Neurological:  Residual weakness on her right from prior CVA. Periods of altered mentation. Psychiatric:  Mood and affect congruent. Difficult to assess given her altered mentation.    Recent Labs Lab 01/26/16 0910 01/27/16 0343  NA 130* 131*  K 3.7 3.6  CL 98* 103  CO2 22 21*  BUN 16 19  CREATININE 0.57 0.62  GLUCOSE 197* 162*    Recent Labs Lab 01/26/16 0910 01/26/16 1452 01/27/16 0343  HGB 13.1 12.7 11.2*  HCT 39.0 38.6 32.6*  WBC 33.1* 37.0* 31.7*  PLT 353 359 317   Dg Chest Port 1 View  01/29/2016  CLINICAL DATA:  Shortness of breath and chest pain. History of breast carcinoma EXAM: PORTABLE CHEST 1 VIEW COMPARISON:  January 26, 2016 FINDINGS: There is significant increase in consolidation throughout the left lower lobe and portions of the lingula. There is a small left effusion. The right lung is clear. Heart is upper normal in size with pulmonary vascularity within normal limits. There is atherosclerotic calcification in the aorta. No bone lesions evident. IMPRESSION: Extensive airspace consolidation throughout the left lower lobe and portions of the lingula. This finding represents a significant change from 2 days prior. There is a small left effusion. Right lung clear. No change in cardiac silhouette. Electronically Signed   By: Lowella Grip III M.D.   On: 01/29/2016 07:54    ASSESSMENT / PLAN:  82 year old female admitted with acute hypoxic respiratory failure secondary to healthcare associated pneumonia. Patient continues to clinically worsen. The only additional coverage that I would recommend at this time would be atypical coverage and testing for possible Legionella. I had a lengthy discussion with the patient and her daughter at bedside regarding goals of care. The patient was requesting medicine to relieve her shortness of breath. I'm not  sure to what extent she understands that treatment of her dyspnea could potentially worsen her respiratory status but certainly the patient appears miserable.   1. Acute hypoxic respiratory failure: Secondary to severe healthcare associated pneumonia. Recommend maintaining a negative fluid balance as well as renal function and blood pressure allow. 2. Severe HCAP: Agree with continuing on vancomycin & cefepime for now. Initiating treatment with azithromycin. Checking urine Legionella antigen. 3. Goals of care: Both patient and daughter willing to speak with palliative medicine. To that and I have contacted their service and requested a consultation.  Sonia Baller Ashok Cordia, M.D. [facility] Pager:  [phone] After 3pm or if no response, call [phone] 01/29/2016, 12:36 PM

## 2016-01-29 NOTE — Progress Notes (Addendum)
Patient ID: Molly [name], female   DOB: [birth year], 82 y.o.   MRN: [id number] [facility] PROGRESS NOTE  Molly [name] [id number] DOB: 02-28-1923 DOA: 01/26/2016 PCP: Antony Blackbird, MD  Brief narrative:    82 year old female with past medical history of recurrent UTIs, breast cancer with bone metastasis, history of CVA with residual right hemiparesis, diabetes mellitus on insulin, dyslipidemia, hypothyroidism, hypertension who presented from skilled nursing facility where reports of generalized weakness and cough intermittently productive of clear sputum for past 2 weeks prior to this admission. Patient reports having taken Mucinex in skilled nursing facility but without significant symptomatic relief. No fevers.   In ED, patient was hemodynamically stable, afebrile. White blood cell count was 33.1, normal creatinine, normal troponin level. Chest x-ray showed left lower lobe consolidation consistent with pneumonia. She was also found to have UTI. Her urine culture is growing Proteus and Klebsiella. She was started on vanco and cefepime.  Barrier to discharge: Ongoing respiratory distress. This morning she requires Ventimask to keep oxygen saturation above 90%. We consulted pulmonary for input.   Assessment/Plan:    Principal Problem:  Generalized weakness - Likely in the setting of pneumonia and UTI in addition to chronic illness related to history of CVA and residual right-sided hemiparesis - PT therapy eval if pt able to participate   Active Problems:  Acute respiratory failure with hypoxia  / HCAP (healthcare-associated pneumonia) / Left lower lobe pneumonia / Leukocytosis - Chest x-ray this morning shows worsening degree of consolidation along the left lower lung and lingular region  - She is having more trouble keeping up oxygen saturation above 90%. She is currently on Ventimask. - Her hypoxia is likely because of inability to get rid of secretions. We placed order  for mucinex, Robitussin, Tessalon capsules none of which helped with clearing up secretions. - She also has DuoNeb every 4 hours as needed  - She is on vancomycin and Zosyn - Influenza negative.  Strep pneumonia negative. HIV is nonreactive - Blood cultures negative so far - Appreciate input from pulmonary    UTI (lower urinary tract infection) due to Proteus mirabilis and Klebsiella pneumonia  - Urinalysis showed moderate leukocytes, positive nitrites and many bacteria - Urine culture is growing Proteus mirabilis and Klebsiella pneumonia species - Continue current antibiotics   Breast cancer metastasized to bone (HCC) - Continue Arimidex    Cerebrovascular accident (CVA) due to stenosis of left carotid artery (HCC) - Stable, with residual right sided hemiparesis - Continue Plavix    Essential hypertension - Continue Norvasc 10 mg daily - Will stop atenolol because of ongoing respiratory distress.   Controlled diabetes mellitus with circulatory complication, with long-term current use of insulin (HCC) - Continue Levemir 10 units daily along with sliding scale insulin   Hypothyroidism - Continue Synthroid   Dyslipidemia associated with type 2 diabetes mellitus ([place]) - Continue zetia    Sacral pressure ulcer - Wound care assessment done - Patient has deep tissue injury to coccyx. Measurement:0.5 cm x 1 cm maroon discoloration to wound bed surrounded by 2 cm blanchable erythema circumferentially - Dressing procedure/placement/frequency:Silicone border foam to sacrococcygeal area. Change every 3 days and PRN soilage.     Severe protein calorie malnutrition - In the setting of chronic illness - Seen by dietician    DVT prophylaxis:  - SCDs bilaterally in hospital    Code Status: DNR/DNI Family Communication: Plan of care discussed with the patient and her daughter at the bedside Disposition Plan: unable  to determine at this point, patient is in more respiratory  distress over last 24-48 hours.  IV access:  Peripheral IV  Procedures and diagnostic studies:    Dg Chest 2 View 01/26/2016 Question COPD with LEFT lower lobe consolidation consistent with pneumonia.   Medical Consultants:  PCCM  Other Consultants:  WOC PT Nutrition   IAnti-Infectives:   Vanco and cefepime 01/26/2016 -->   Leisa Lenz, MD  [facility] Pager [phone]  Time spent in minutes: 25 minutes  If 7PM-7AM, please contact night-coverage [email or web address] Password [facility] 01/29/2016, 9:56 AM   LOS: 3 days    HPI/Subjective: No acute overnight events. Patient reports feeling short of breath.  Objective: Filed Vitals:   01/28/16 1659 01/28/16 2031 01/29/16 0508 01/29/16 0650  BP: 161/57 163/59 162/47   Pulse: 66 70 64   Temp: 98.7 F (37.1 C) 99.4 F (37.4 C) 98.1 F (36.7 C)   TempSrc: Axillary Axillary Oral   Resp: 18 20 18    Height:      Weight:      SpO2: 86% 90% 87% 95%    Intake/Output Summary (Last 24 hours) at 01/29/16 0956 Last data filed at 01/29/16 [id number]  Gross per 24 hour  Intake  299.1 ml  Output      0 ml  Net  299.1 ml    Exam:   General:  Pt is alert, distress due to shortness of breath  Cardiovascular: RRR, appreciate S1,S2, SEM appreciated   Respiratory: coarse sounds, wheezing in upper lung lobes   Abdomen: non tender abd, (+) BS  Extremities: No edema, palpable pulses   Neuro: No focal deficits   Data Reviewed: Basic Metabolic Panel:  Recent Labs Lab 01/26/16 0910 01/27/16 0343  NA 130* 131*  K 3.7 3.6  CL 98* 103  CO2 22 21*  GLUCOSE 197* 162*  BUN 16 19  CREATININE 0.57 0.62  CALCIUM 8.8* 8.4*   Liver Function Tests:  Recent Labs Lab 01/26/16 0910 01/27/16 0343  AST 11* 8*  ALT 10* 7*  ALKPHOS 79 76  BILITOT 0.8 0.6  PROT 6.0* 5.4*  ALBUMIN 3.0* 2.5*   No results for input(s): LIPASE, AMYLASE in the last 168 hours. No results for input(s): AMMONIA in the last 168 hours. CBC:  Recent  Labs Lab 01/26/16 0910 01/26/16 1452 01/27/16 0343  WBC 33.1* 37.0* 31.7*  NEUTROABS 16.9* 21.0*  --   HGB 13.1 12.7 11.2*  HCT 39.0 38.6 32.6*  MCV 90.7 91.3 87.2  PLT 353 359 317   Cardiac Enzymes:  Recent Labs Lab 01/26/16 0910  TROPONINI <0.03   BNP: Invalid input(s): POCBNP CBG:  Recent Labs Lab 01/28/16 1156 01/28/16 1637 01/28/16 2234 01/29/16 0136 01/29/16 0801  GLUCAP 186* 134* 103* 124* 138*    Recent Results (from the past 240 hour(s))  Culture, blood (routine x 2) Call MD if unable to obtain prior to antibiotics being given     Status: None (Preliminary result)   Collection Time: 01/26/16  2:52 PM  Result Value Ref Range Status   Specimen Description BLOOD LEFT HAND  Final   Special Requests IN PEDIATRIC BOTTLE 1CC  Final   Culture   Final    NO GROWTH 2 DAYS Performed at [facility]    Report Status PENDING  Incomplete  Culture, blood (routine x 2) Call MD if unable to obtain prior to antibiotics being given     Status: None (Preliminary result)   Collection Time: 01/26/16  2:52  PM  Result Value Ref Range Status   Specimen Description BLOOD LEFT HAND  Final   Special Requests IN PEDIATRIC BOTTLE .5CC  Final   Culture   Final    NO GROWTH 2 DAYS Performed at [facility]    Report Status PENDING  Incomplete  Culture, Urine     Status: None   Collection Time: 01/26/16  3:25 PM  Result Value Ref Range Status   Specimen Description URINE, CATHETERIZED  Final   Special Requests NONE  Final   Culture   Final    >=100,000 COLONIES/mL PROTEUS MIRABILIS >=100,000 COLONIES/mL KLEBSIELLA PNEUMONIAE Performed at [facility]    Report Status 01/29/2016 FINAL  Final   Organism ID, Bacteria PROTEUS MIRABILIS  Final   Organism ID, Bacteria KLEBSIELLA PNEUMONIAE  Final      Susceptibility   Klebsiella pneumoniae - MIC*    AMPICILLIN 16 RESISTANT Resistant     CEFAZOLIN <=4 SENSITIVE Sensitive     CEFTRIAXONE <=1 SENSITIVE  Sensitive     CIPROFLOXACIN <=0.25 SENSITIVE Sensitive     GENTAMICIN <=1 SENSITIVE Sensitive     IMIPENEM <=0.25 SENSITIVE Sensitive     NITROFURANTOIN 32 SENSITIVE Sensitive     TRIMETH/SULFA <=20 SENSITIVE Sensitive     AMPICILLIN/SULBACTAM 4 SENSITIVE Sensitive     PIP/TAZO <=4 SENSITIVE Sensitive     * >=100,000 COLONIES/mL KLEBSIELLA PNEUMONIAE   Proteus mirabilis - MIC*    AMPICILLIN <=2 SENSITIVE Sensitive     CEFAZOLIN <=4 SENSITIVE Sensitive     CEFTRIAXONE <=1 SENSITIVE Sensitive     CIPROFLOXACIN <=0.25 SENSITIVE Sensitive     GENTAMICIN <=1 SENSITIVE Sensitive     IMIPENEM 1 SENSITIVE Sensitive     NITROFURANTOIN 256 RESISTANT Resistant     TRIMETH/SULFA <=20 SENSITIVE Sensitive     AMPICILLIN/SULBACTAM <=2 SENSITIVE Sensitive     PIP/TAZO <=4 SENSITIVE Sensitive     * >=100,000 COLONIES/mL PROTEUS MIRABILIS     Scheduled Meds: . amLODipine  10 mg Oral Daily  . anastrozole  1 mg Oral Daily  . antiseptic oral rinse  7 mL Mouth Rinse q12n4p  . atenolol  25 mg Oral TID  . benzonatate  200 mg Oral BID  . ceFEPime (MAXIPIME) IV  1 g Intravenous Q12H  . chlorhexidine  15 mL Mouth Rinse BID  . clopidogrel  75 mg Oral Daily  . vitamin B-12  500 mcg Oral Daily  . dextromethorphan-guaiFENesin  1 tablet Oral BID  . ezetimibe  10 mg Oral Daily  . feeding supplement (GLUCERNA SHAKE)  237 mL Oral TID BM  . insulin aspart  0-9 Units Subcutaneous TID WC  . insulin detemir  10 Units Subcutaneous Q1200  . levothyroxine  75 mcg Oral QAC breakfast  . vancomycin  1,000 mg Intravenous Q24H

## 2016-01-30 ENCOUNTER — Inpatient Hospital Stay (HOSPITAL_COMMUNITY): Payer: Medicare Other

## 2016-01-30 ENCOUNTER — Encounter (HOSPITAL_COMMUNITY): Payer: Self-pay | Admitting: Radiology

## 2016-01-30 DIAGNOSIS — J9600 Acute respiratory failure, unspecified whether with hypoxia or hypercapnia: Secondary | ICD-10-CM

## 2016-01-30 DIAGNOSIS — Z7189 Other specified counseling: Secondary | ICD-10-CM | POA: Insufficient documentation

## 2016-01-30 DIAGNOSIS — R0600 Dyspnea, unspecified: Secondary | ICD-10-CM | POA: Insufficient documentation

## 2016-01-30 DIAGNOSIS — Z515 Encounter for palliative care: Secondary | ICD-10-CM | POA: Insufficient documentation

## 2016-01-30 LAB — EXPECTORATED SPUTUM ASSESSMENT W GRAM STAIN, RFLX TO RESP C

## 2016-01-30 LAB — GLUCOSE, CAPILLARY
GLUCOSE-CAPILLARY: 153 mg/dL — AB (ref 65–99)
GLUCOSE-CAPILLARY: 155 mg/dL — AB (ref 65–99)
GLUCOSE-CAPILLARY: 151 mg/dL — AB (ref 65–99)
GLUCOSE-CAPILLARY: 128 mg/dL — AB (ref 65–99)

## 2016-01-30 LAB — EXPECTORATED SPUTUM ASSESSMENT W REFEX TO RESP CULTURE

## 2016-01-30 LAB — CREATININE, SERUM
CREATININE: 0.53 mg/dL (ref 0.44–1.00)
GFR calc Af Amer: >60 mL/min (ref >60)
GFR calc non Af Amer: >60 mL/min (ref >60)

## 2016-01-30 LAB — VANCOMYCIN, TROUGH: Vancomycin Tr: 11 ug/mL (ref 10.0–20.0)

## 2016-01-30 MED ORDER — VANCOMYCIN HCL 10 G IV SOLR
1250.0000 mg | INTRAVENOUS | Status: DC
  Administered 2016-01-30: 1250 mg via INTRAVENOUS
  Filled 2016-01-30 (×2): qty 1250

## 2016-01-30 MED ORDER — ACETAMINOPHEN 325 MG PO TABS: 650.0000 mg | ORAL_TABLET | Freq: Four times a day (QID) | ORAL | Status: DC | PRN

## 2016-01-30 MED ORDER — VANCOMYCIN HCL 10 G IV SOLR
1250.0000 mg | INTRAVENOUS | Status: DC
  Filled 2016-01-30: qty 1250

## 2016-01-30 NOTE — Care Management Important Message (Addendum)
Important Message  Patient Details IM Letter given to Nora/Case Manager to present to Patient Name: SHERRYN NEEB MRN: [id number] Date of Birth: September 14, 1923   Medicare Important Message Given:  Yes    Camillo Flaming 01/30/2016, 10:39 AMImportant Message  Patient Details  Name: CHADSITY SOWDER MRN: [id number] Date of Birth: 1922-12-20   Medicare Important Message Given:  Yes    Camillo Flaming 01/30/2016, 10:39 AM

## 2016-01-30 NOTE — Progress Notes (Signed)
Daily Progress Note   Patient Name: Molly [name]       Date: 01/30/2016 DOB: [birth year]  Age: 82 y.o. MRN#: [id number] Attending Physician: Robbie Lis, MD Primary Care Physician: Antony Blackbird, MD Admit Date: 01/26/2016  Reason for Consultation/Follow-up: Establishing goals of care  Subjective: Reports that she continues to have chest ache.  States that she had some improvement with tylenol.   She is adamant that she does not want trial of opioids for her pain or SOB.    I discussed plan with her daughter and patient, and are in agreement with continuing current therapies for another 24 hours, await results of CT, and reassess situation tomorrow.  She asked again about hospice and I told her that this is something that our team would be happy to review again with her tomorrow.  Length of Stay: 4 days  Current Medications: Scheduled Meds:  . amLODipine  10 mg Oral Daily  . anastrozole  1 mg Oral Daily  . antiseptic oral rinse  7 mL Mouth Rinse q12n4p  . azithromycin  250 mg Intravenous Q24H  . benzonatate  200 mg Oral BID  . ceFEPime (MAXIPIME) IV  1 g Intravenous Q12H  . chlorhexidine  15 mL Mouth Rinse BID  . clopidogrel  75 mg Oral Daily  . vitamin B-12  500 mcg Oral Daily  . dextromethorphan-guaiFENesin  1 tablet Oral BID  . ezetimibe  10 mg Oral Daily  . feeding supplement (GLUCERNA SHAKE)  237 mL Oral TID BM  . insulin aspart  0-9 Units Subcutaneous TID WC  . insulin detemir  10 Units Subcutaneous Q1200  . levothyroxine  75 mcg Oral QAC breakfast  . vancomycin  1,250 mg Intravenous Q24H    Continuous Infusions:    PRN Meds: acetaminophen, alum & mag hydroxide-simeth, guaiFENesin-dextromethorphan, ipratropium-albuterol, ondansetron (ZOFRAN) IV, polyethylene  glycol  Physical Exam: Physical Exam      General: Awake. Alert. Ill-appearing elderly female. Daughter  And sister at bedside.  Integument: Warm & dry.  HEENT: No scleral injection or icterus. Tacky mucus membranes.  Cardiovascular: Regular rate. No edema. No appreciable JVD.  Pulmonary: Decreased breath sounds in left base. Mild increase in work of breathing on NRB mask. Abdomen: Soft. Normal bowel sounds. Nondistended. Grossly nontender. Musculoskeletal: No joint deformity or effusion appreciated.  Weak on her right side. Neurological: Residual weakness on her right from prior CVA. Periods of altered mentation. Psychiatric: Difficult to assess given her altered mentation, but she is more awake on exam today.          Vital Signs: BP 149/44 mmHg  Pulse 75  Temp(Src) 99.7 F (37.6 C) (Axillary)  Resp 20  Ht 5\' 4"  (1.626 m)  Wt 69.854 kg (154 lb)  BMI 26.42 kg/m2  SpO2 95% SpO2: SpO2: 95 % O2 Device: O2 Device: NRB O2 Flow Rate: O2 Flow Rate (L/min): 15 L/min  Intake/output summary:  Intake/Output Summary (Last 24 hours) at 01/30/16 1610 Last data filed at 01/30/16 1300  Gross per 24 hour  Intake  734.8 ml  Output    850 ml  Net -115.2 ml   LBM: Last BM Date: 01/29/16 Baseline Weight: Weight: 69.854 kg (154 lb) Most recent weight: Weight: 69.854 kg (154 lb)       Palliative Assessment/Data: Flowsheet Rows        Most Recent Value   Intake Tab    Referral Department  Critical care   Unit at Time of Referral  Oncology Unit   Palliative Care Primary Diagnosis  Sepsis/Infectious Disease   Date Notified  01/29/16   Palliative Care Type  New Palliative care   Date of Admission  01/26/16   Date first seen by Palliative Care  01/30/16   # of days Palliative referral response time  1 Day(s)   # of days IP prior to Palliative referral  3   Clinical Assessment    Palliative Performance Scale Score  40%   Pain Max last 24 hours  4   Pain Min Last 24 hours  0    Dyspnea Max Last 24 Hours  10   Dyspnea Min Last 24 hours  4   Psychosocial & Spiritual Assessment    Palliative Care Outcomes    Patient/Family meeting held?  Yes   Who was at the meeting?  Patient, daughter   Palliative Care Outcomes  Provided psychosocial or spiritual support, Clarified goals of care      Additional Data Reviewed: CBC    Component Value Date/Time   WBC 31.7* 01/27/2016 0343   WBC 24.8* 02/07/2015 1432   RBC 3.74* 01/27/2016 0343   RBC 4.20 02/07/2015 1432   HGB 11.2* 01/27/2016 0343   HGB 12.5 02/07/2015 1432   HCT 32.6* 01/27/2016 0343   HCT 38.0 02/07/2015 1432   PLT 317 01/27/2016 0343   PLT 336 02/07/2015 1432   MCV 87.2 01/27/2016 0343   MCV 90.5 02/07/2015 1432   MCH 29.9 01/27/2016 0343   MCH 29.8 02/07/2015 1432   MCHC 34.4 01/27/2016 0343   MCHC 32.9 02/07/2015 1432   RDW 13.5 01/27/2016 0343   RDW 14.0 02/07/2015 1432   LYMPHSABS 14.5* 01/26/2016 1452   LYMPHSABS 13.2* 02/07/2015 1432   MONOABS 1.4* 01/26/2016 1452   MONOABS 1.0* 02/07/2015 1432   EOSABS 0.0 01/26/2016 1452   EOSABS 0.3 02/07/2015 1432   BASOSABS 0.1 01/26/2016 1452   BASOSABS 0.1 02/07/2015 1432    CMP     Component Value Date/Time   NA 131* 01/27/2016 0343   NA 136 09/22/2014 1426   K 3.6 01/27/2016 0343   K 3.8 09/22/2014 1426   CL 103 01/27/2016 0343   CO2 21* 01/27/2016 0343   CO2 21* 09/22/2014 1426   GLUCOSE 162* 01/27/2016 0343   GLUCOSE 308* 09/22/2014 1426   BUN  19 01/27/2016 0343   BUN 11.4 09/22/2014 1426   CREATININE 0.53 01/30/2016 0937   CREATININE 0.9 09/22/2014 1426   CALCIUM 8.4* 01/27/2016 0343   CALCIUM 9.5 09/22/2014 1426   PROT 5.4* 01/27/2016 0343   PROT 6.7 09/22/2014 1426   ALBUMIN 2.5* 01/27/2016 0343   ALBUMIN 3.4* 09/22/2014 1426   AST 8* 01/27/2016 0343   AST 14 09/22/2014 1426   ALT 7* 01/27/2016 0343   ALT 17 09/22/2014 1426   ALKPHOS 76 01/27/2016 0343   ALKPHOS 90 09/22/2014 1426   BILITOT 0.6 01/27/2016 0343    BILITOT 0.23 09/22/2014 1426   GFRNONAA >60 01/30/2016 0937   GFRAA >60 01/30/2016 0937       Problem List:  Patient Active Problem List   Diagnosis Date Noted  . Palliative care encounter   . Goals of care, counseling/discussion   . Acute respiratory failure ([place])   . Pressure ulcer 01/27/2016  . HCAP (healthcare-associated pneumonia) 01/26/2016  . Leukocytosis 01/26/2016  . Left lower lobe pneumonia 01/26/2016  . UTI (lower urinary tract infection) 01/26/2016  . Controlled diabetes mellitus with circulatory complication, with long-term current use of insulin ([place]) 01/26/2016  . Hypothyroidism 01/26/2016  . Dyslipidemia associated with type 2 diabetes mellitus ([place]) 01/26/2016  . Generalized weakness 01/26/2016  . Cerebrovascular accident (CVA) due to stenosis of left carotid artery ([place]) 08/29/2015  . Essential hypertension 08/29/2015  . Breast cancer metastasized to bone [facility]) 08/02/2011     Palliative Care Assessment & Plan    1.Code Status:  DNR    Code Status Orders        Start     Ordered   01/28/16 1832  Do not attempt resuscitation (DNR)   Continuous    Question Answer Comment  In the event of cardiac or respiratory ARREST Do not call a "code blue"   In the event of cardiac or respiratory ARREST Do not perform Intubation, CPR, defibrillation or ACLS   In the event of cardiac or respiratory ARREST Use medication by any route, position, wound care, and other measures to relive pain and suffering. May use oxygen, suction and manual treatment of airway obstruction as needed for comfort.      01/28/16 1832    Code Status History    Date Active Date Inactive Code Status Order ID Comments User Context   01/26/2016 12:07 PM 01/28/2016  6:32 PM Full Code [id number]  Robbie Lis, MD ED   01/26/2016 12:02 PM 01/26/2016 12:07 PM DNR [id number]  Robbie Lis, MD ED   08/29/2015  4:38 PM 09/02/2015  4:50 PM DNR [id number]  Melton Alar, PA-C Inpatient   08/15/2015   2:58 PM 08/18/2015  8:44 PM DNR [id number]  Melton Alar, PA-C Inpatient   08/10/2015  3:21 AM 08/15/2015  2:58 PM Full Code [id number]  Phillips Grout, MD Inpatient   07/29/2015  1:21 PM 08/02/2015  8:55 PM Full Code [id number]  Marliss Coots, PA-C Inpatient   06/19/2015  5:58 PM 06/21/2015  2:52 PM Full Code [id number]  Kathie Dike, MD ED   05/11/2015  4:11 PM 05/14/2015  4:22 PM Full Code [id number]  Samella Parr, NP Inpatient   07/07/2014 12:14 AM 07/11/2014  3:06 PM Full Code [id number]  Rise Patience, MD Inpatient       2. Goals of Care/Additional Recommendations: Plan to continue with current therapies for another 24 hours to see if she responds.  She also had CT scan  completed and would like to discuss results with pulmonary team. -  If she is not improving with current interventions, we discussed option of transition to hospice and I think would be a good candidate for residential hospice support for end-of-life care.   Psycho-social Needs: Education on Hospice  3. Symptom Management:      1.SOB: I still believe that she would benefit from addition of morphine for use as needed.  She again reports that this is not something that she would want to try.  She also declined to consider trial of a bedside fan, which has been shown to potentially improve sensation of dyspnea through stimulation of the trigeminal nerve.  2. Pain: reports ache in ribs.  She has declined to try morphine.  Plan for tylenol.  4. Palliative Prophylaxis:   Aspiration, Bowel Regimen, Delirium Protocol and Frequent Pain Assessment  5. Prognosis: Unable to determine.  If her clinical condition continues to decline despite current therapies, I feel that her prognosis would likely be a matter of days. If decision made to focus on comfort, I believe she will have continued symptoms that need to be managed aggressively. She is high risk for acute decompensation. I think that she would be well served by placement at  residential hospice facility for end-of-life care if comfort care approach comes a plan of care.   6. Discharge Planning:  To be determined   Care plan was discussed with patient, daughter  Thank you for allowing the Palliative Medicine Team to assist in the care of this patient.   Time In: [id number] Time Out: 1525 Total Time 30 Prolonged Time Billed  No        Zoee Heeney Domingo Cocking, MD  01/30/2016, 4:10 PM  Please contact Palliative Medicine Team phone at [phone] for questions and concerns.

## 2016-01-30 NOTE — Progress Notes (Signed)
Pharmacy Antibiotic Note  Molly [name] is a 82 y.o. female admitted on 01/26/2016 with pneumonia/HCAP.  Pharmacy has been consulted for Vancomycin dosing and to renally-adjust Cefepime.  Today, 01/30/2016: Temp: 99.1 WBC: markedly elevated(as of 3/17) Renal: SCr wnl and stable Klebsiella and Proteus in urine - likely covered by Cefepime.  Plan:  MD continuing current IV antibiotic regimen due to worsening CXR 3/19 and high O2 requirements  D5 of 7, cont Vancomycin 1g IV q24h; trough < goal 15-20 mcg/mL - increase 1250mg  IV q24h starting today  D5 of 7, cont Cefepime 1g IV q12h.  D2 of Zithromax per CCM Follow up renal fxn, final blood culture results, and clinical course.  Height: 5\' 4"  (162.6 cm) Weight: 154 lb (69.854 kg) IBW/kg (Calculated) : 54.7  Temp (24hrs), Avg:98.5 F (36.9 C), Min:98.1 F (36.7 C), Max:99.1 F (37.3 C)   Recent Labs Lab 01/26/16 0910 01/26/16 1452 01/27/16 0343 01/30/16 0937  WBC 33.1* 37.0* 31.7*  --   CREATININE 0.57  --  0.62 0.53  VANCOTROUGH  --   --   --  11    Estimated Creatinine Clearance: 44.9 mL/min (by C-G formula based on Cr of 0.53).    Allergies  Allergen Reactions  . Amlodipine Swelling    Currently taking exforge, separate med-Amlodipiine caused lower leg edema  . Contrast Media [Iodinated Diagnostic Agents] Nausea Only and Other (See Comments)    Patient experienced chills, nausea, hypothermic symptoms, also weird feelings  . Hctz [Hydrochlorothiazide] Other (See Comments)    Severe hypotension  . Hydralazine Other (See Comments)    Severe hypotension-suddenly and drastically dropped BP  . Metoprolol Other (See Comments)    Severe hypotension-suddenly and drastically dropped BP, same as hydralazine  . Codeine Other (See Comments)    Makes her feel "crazy."  . Demerol Other (See Comments)    Makes her feel "crazy."  . Librium Other (See Comments)    Makes her feel "crazy."  . Lipitor [Atorvastatin Calcium]  Other (See Comments)    Makes her feel "crazy."    Antimicrobials this admission: 3/16 >> Vanc >>  3/16 >> Cefepime >>  3/19 >> Azithromycin >>   Dose adjustments this admission: 3/20 VT at 1000 = 11 before 5th total dose, increase 1250mg  q24h  Microbiology results: Hx pseudomonas (I gent), E coli (R cipro), K pneumo (R amp) in urine 3/16 BCx: ngtd 3/16 UCx: Klebsiella - pan-sens except amp. Proteus - pan-sens except nitro. 3/16 sputum Cx: ordered 3/16 S. pneumo UAg: neg 3/16 Influenza PCR: neg 3/16 Resp virus: sent 3/19 Legionella Ur Ag: IP  Thank you for allowing pharmacy to be a part of this patient's care.  Peggyann Juba, PharmD, BCPS Pager: [phone] 01/30/2016,10:29 AM.

## 2016-01-30 NOTE — Progress Notes (Addendum)
Name: MARLESE BRENDER MRN: [id number] DOB: [birth year]    ADMISSION DATE:  01/26/2016 CONSULTATION DATE:  01/29/2016  REFERRING MD :  Leisa Lenz, M.D. / Hospitalist  CHIEF COMPLAINT:  Acute Hypoxic Respiratory Failure  BRIEF PATIENT DESCRIPTION:   82 year old female currently living at a skilled nursing facility. Presents with a two-week history of cough along with leukocytosis. Started empirically on vancomycin & cefepime. Infectious workup thus far has been negative. Patient reports dyspnea and cough continuing to worsen. She reports cough remains nonproductive. Denies any fever, chills, or sweats. She does report a "tightness" in her chest but denies any frank chest pain. She did have nausea and vomiting yesterday but denies any abdominal pain. She reports poor oral intake. She denies any sore throat or sinus congestion. No recent travel. Patient does have somewhat altered mentation making an accurate history difficult.   STUDIES:  Port CXR 3/19:  Personally reviewed by me. Significant worsening of the left lower and mid lung opacities when compared with previous imaging.  MICROBIOLOGY: Blood Ctx x2 3/16>>> Urine Ctx 3/16:  Proteus & Klebsiella  Influenza PCR 3/16:  Negative Respiratory Viral Panel PCR 3/16>>> HIV 3/17:  Negative  Urine Strep Ag 3/16:  Negative   ANTIBIOTICS: Cefepime 3/16>>> Vancomycin 3/16>>> Azithro 3/19 >>     SIGNIFICANT EVENTS  3/16 - Admit  SUBJECTIVE:   01/30/16 - RN reports that yesterday o2 needs went upt to FM 13L [place] and today pulse ox 92% on that. This is new and worsening hypxoemia for patient since admit. SHe is fully oriented and reports dyspnea, congestion, chronic back ache and fatigue. Frustrated she is ill. ECHO sept 2016 - ef 65%  Pall care notes -reviewed - DNR, DNI but continue current Rx  VITAL SIGNS: Temp:  [98.1 F (36.7 C)-99.1 F (37.3 C)] 99.1 F (37.3 C) (03/20 0600) Pulse Rate:  [57-79] 79 (03/20 0600) Resp:  [18-20]  20 (03/20 0600) BP: (151-164)/(54-67) 152/61 mmHg (03/20 0600) SpO2:  [90 %-95 %] 90 % (03/20 0600)  PHYSICAL EXAMINATION: General:  Eldderly female. No distress. Face mask o2 on Integument:  Warm & dry. No rash on exposed skin.  Lymphatics:  No appreciated cervical or supraclavicular lymphadenoapthy. HEENT:  No scleral injection or icterus. Tacky mucus membranes.  Cardiovascular:  Regular rate. No edema. No appreciable JVD.  Pulmonary:  Decreased breath sounds in left lung base. Mildly increased work of breathing on NRB mask. Abdomen: Soft. Normal bowel sounds. Nondistended. Grossly nontender. Musculoskeletal:  No joint deformity or effusion appreciated. Weak on her right side. Neurological:  Residual weakness on her right from prior CVA. Oritned x 3 now Psychiatric:  Pleasant    PULMONARY No results for input(s): PHART, PCO2ART, PO2ART, HCO3, TCO2, O2SAT in the last 168 hours.  Invalid input(s): PCO2, PO2  CBC  Recent Labs Lab 01/26/16 0910 01/26/16 1452 01/27/16 0343  HGB 13.1 12.7 11.2*  HCT 39.0 38.6 32.6*  WBC 33.1* 37.0* 31.7*  PLT 353 359 317    COAGULATION No results for input(s): INR in the last 168 hours.  CARDIAC   Recent Labs Lab 01/26/16 0910  TROPONINI <0.03   No results for input(s): PROBNP in the last 168 hours.   CHEMISTRY  Recent Labs Lab 01/26/16 0910 01/27/16 0343 01/30/16 0937  NA 130* 131*  --   K 3.7 3.6  --   CL 98* 103  --   CO2 22 21*  --   GLUCOSE 197* 162*  --   BUN 16  19  --   CREATININE 0.57 0.62 0.53  CALCIUM 8.8* 8.4*  --    Estimated Creatinine Clearance: 44.9 mL/min (by C-G formula based on Cr of 0.53).   LIVER  Recent Labs Lab 01/26/16 0910 01/27/16 0343  AST 11* 8*  ALT 10* 7*  ALKPHOS 79 76  BILITOT 0.8 0.6  PROT 6.0* 5.4*  ALBUMIN 3.0* 2.5*     INFECTIOUS No results for input(s): LATICACIDVEN, PROCALCITON in the last 168 hours.   ENDOCRINE CBG (last 3)   Recent Labs  01/29/16 1239  01/29/16 2123 01/30/16 0756  GLUCAP 152* 128* 153*         IMAGING x48h  - image(s) personally visualized  -   highlighted in bold Dg Chest Port 1 View  01/29/2016  CLINICAL DATA:  Shortness of breath and chest pain. History of breast carcinoma EXAM: PORTABLE CHEST 1 VIEW COMPARISON:  January 26, 2016 FINDINGS: There is significant increase in consolidation throughout the left lower lobe and portions of the lingula. There is a small left effusion. The right lung is clear. Heart is upper normal in size with pulmonary vascularity within normal limits. There is atherosclerotic calcification in the aorta. No bone lesions evident. IMPRESSION: Extensive airspace consolidation throughout the left lower lobe and portions of the lingula. This finding represents a significant change from 2 days prior. There is a small left effusion. Right lung clear. No change in cardiac silhouette. Electronically Signed   By: Lowella Grip III M.D.   On: 01/29/2016 07:54       ASSESSMENT / PLAN:   01/30/16 82 year old female admitted with acute hypoxic respiratory failure secondary to healthcare associated pneumonia. Patient continues to clinically worsen  PLAN - get CT chest wo contrast - assess for anypotential causes to help facilitate improve hypxoemia  - otherwise continue broad antibiptics + supportive care and hope for the best - at age 89, with her medical problems risk of mortality higher esp with delirium  Reports but normal renal function, normal BP, floor admission portend that she has chance for recovery albeit slow - check mag, phos and urine legionella  D/w RN at beside     Dr. Brand Males, M.D., [facility].C.P Pulmonary and Critical Care Medicine Staff Physician [place] Pager: [phone], If no answer or between  15:00h - 7:00h: call [phone]  01/30/2016 10:35 AM

## 2016-01-30 NOTE — Progress Notes (Addendum)
Patient ID: Molly [name], female   DOB: [birth year], 82 y.o.   MRN: [id number] [facility] PROGRESS NOTE  Molly [name] [id number] DOB: 1923-06-09 DOA: 01/26/2016 PCP: Antony Blackbird, MD  Brief narrative:    82 year old female with past medical history of recurrent UTIs, breast cancer with bone metastasis, history of CVA with residual right hemiparesis, diabetes mellitus on insulin, dyslipidemia, hypothyroidism, hypertension who presented from skilled nursing facility where reports of generalized weakness and cough intermittently productive of clear sputum for past 2 weeks prior to this admission. Patient reports having taken Mucinex in skilled nursing facility but without significant symptomatic relief. No fevers.   In ED, patient was hemodynamically stable, afebrile. White blood cell count was 33.1, normal creatinine, normal troponin level. Chest x-ray showed left lower lobe consolidation consistent with pneumonia. She was also found to have UTI. Her urine culture is growing Proteus and Klebsiella. She was started on vanco and cefepime.  Barrier to discharge: Palliative care consulted for goals of care. Anticipate discharge to res hosp if no further improvement.   Assessment/Plan:    Principal Problem:  Generalized weakness - Likely in the setting of pneumonia and UTI in addition to chronic illness related to history of CVA and residual right-sided hemiparesis - Palliative care consulted for goals of care  Active Problems:  Acute respiratory failure with hypoxia  / HCAP (healthcare-associated pneumonia) / Left lower lobe pneumonia / Leukocytosis - Chest x-ray 3/19 showed worsening degree of consolidation along the left lower lung and lingular region  - Cough has not improved with Robitussin, Mucinex, Tessalon - She is currently on Ventimask to keep oxygen saturation above 90%- - patient remains on broad-spectrum antibiotics, cefepime and vancomycin. Per pulmonary, added  azithromycin 01/29/2016. - continue current nebulizer treatments - Influenza negative.  Strep pneumonia negative. HIV is nonreactive - Blood cultures negative so far   UTI (lower urinary tract infection) due to Proteus mirabilis and Klebsiella pneumonia  - Urinalysis showed moderate leukocytes, positive nitrites and many bacteria - Urine culture is growing Proteus mirabilis and Klebsiella pneumonia species - Current antibiotics will cover for this   Breast cancer metastasized to bone [facility]) - Continue Arimidex    Cerebrovascular accident (CVA) due to stenosis of left carotid artery (HCC) - Residual right sided hemiparesis - Continue Plavix    Essential hypertension - Continue Norvasc 10 mg daily - Blood pressure 152/61 - Stopped atenolol 01/29/2016 because of respiratory distress   Controlled diabetes mellitus with circulatory complication, with long-term current use of insulin (HCC) - Continue Levemir 10 units daily along with sliding scale insulin - CBGs in past 24 hours: 152, 128, 153   Hypothyroidism - Continue Synthroid   Dyslipidemia associated with type 2 diabetes mellitus ([place]) - Continue zetia    Sacral pressure ulcer - Wound care assessment done - Patient has deep tissue injury to coccyx. Measurement:0.5 cm x 1 cm maroon discoloration to wound bed surrounded by 2 cm blanchable erythema circumferentially - Dressing procedure/placement/frequency:Silicone border foam to sacrococcygeal area. Change every 3 days and PRN soilage.     Severe protein calorie malnutrition - In the setting of chronic illness - Diet as tolerated  DVT prophylaxis:  - SCDs bilaterally    Code Status: DNR/DNI Family Communication: Plan of care discussed with the patient and her daughter at the bedside Disposition Plan: unable to determine at this point, PCT following   IV access:  Peripheral IV  Procedures and diagnostic studies:    Dg Chest 2 View 01/26/2016  Question COPD with  LEFT lower lobe consolidation consistent with pneumonia.   Medical Consultants:  PCCM Palliative care   Other Consultants:  WOC PT Nutrition   IAnti-Infectives:   Vanco and cefepime 01/26/2016 --> Azithromycin 01/29/2016 -->  Leisa Lenz, MD  [facility] Pager [phone]  Time spent in minutes: 25 minutes  If 7PM-7AM, please contact night-coverage [email or web address] Password [email or web address] 01/30/2016, 9:44 AM   LOS: 4 days    HPI/Subjective: No acute overnight events. Patient reports she cant breath through her nose.   Objective: Filed Vitals:   01/29/16 0650 01/29/16 1422 01/29/16 2131 01/30/16 0600  BP:  151/54 164/67 152/61  Pulse:  57 67 79  Temp:  98.4 F (36.9 C) 98.1 F (36.7 C) 99.1 F (37.3 C)  TempSrc:  Axillary Axillary Axillary  Resp:  18 20 20   Height:      Weight:      SpO2: 95% 95% 93% 90%    Intake/Output Summary (Last 24 hours) at 01/30/16 0944 Last data filed at 01/30/16 [id number]  Gross per 24 hour  Intake  734.8 ml  Output    690 ml  Net   44.8 ml    Exam:   General:  Pt is alert, mild distress  Cardiovascular: RRR, S1,S2 (+), SEM appreciated   Respiratory: coarse , no wheezing   Abdomen: (+) BS, non tender   Extremities: trace LE edema, palpable pulses   Neuro: Non focal   Data Reviewed: Basic Metabolic Panel:  Recent Labs Lab 01/26/16 0910 01/27/16 0343  NA 130* 131*  K 3.7 3.6  CL 98* 103  CO2 22 21*  GLUCOSE 197* 162*  BUN 16 19  CREATININE 0.57 0.62  CALCIUM 8.8* 8.4*   Liver Function Tests:  Recent Labs Lab 01/26/16 0910 01/27/16 0343  AST 11* 8*  ALT 10* 7*  ALKPHOS 79 76  BILITOT 0.8 0.6  PROT 6.0* 5.4*  ALBUMIN 3.0* 2.5*   No results for input(s): LIPASE, AMYLASE in the last 168 hours. No results for input(s): AMMONIA in the last 168 hours. CBC:  Recent Labs Lab 01/26/16 0910 01/26/16 1452 01/27/16 0343  WBC 33.1* 37.0* 31.7*  NEUTROABS 16.9* 21.0*  --   HGB 13.1 12.7 11.2*  HCT 39.0 38.6 32.6*   MCV 90.7 91.3 87.2  PLT 353 359 317   Cardiac Enzymes:  Recent Labs Lab 01/26/16 0910  TROPONINI <0.03   BNP: Invalid input(s): POCBNP CBG:  Recent Labs Lab 01/29/16 0136 01/29/16 0801 01/29/16 1239 01/29/16 2123 01/30/16 0756  GLUCAP 124* 138* 152* 128* 153*    Recent Results (from the past 240 hour(s))  Culture, blood (routine x 2) Call MD if unable to obtain prior to antibiotics being given     Status: None (Preliminary result)   Collection Time: 01/26/16  2:52 PM  Result Value Ref Range Status   Specimen Description BLOOD LEFT HAND  Final   Special Requests IN PEDIATRIC BOTTLE 1CC  Final   Culture   Final    NO GROWTH 3 DAYS Performed at [facility]    Report Status PENDING  Incomplete  Culture, blood (routine x 2) Call MD if unable to obtain prior to antibiotics being given     Status: None (Preliminary result)   Collection Time: 01/26/16  2:52 PM  Result Value Ref Range Status   Specimen Description BLOOD LEFT HAND  Final   Special Requests IN PEDIATRIC BOTTLE .[facility]  Final   Culture   Final  NO GROWTH 3 DAYS Performed at [facility]    Report Status PENDING  Incomplete  Culture, Urine     Status: None   Collection Time: 01/26/16  3:25 PM  Result Value Ref Range Status   Specimen Description URINE, CATHETERIZED  Final   Special Requests NONE  Final   Culture   Final    >=100,000 COLONIES/mL PROTEUS MIRABILIS >=100,000 COLONIES/mL KLEBSIELLA PNEUMONIAE Performed at [facility]    Report Status 01/29/2016 FINAL  Final   Organism ID, Bacteria PROTEUS MIRABILIS  Final   Organism ID, Bacteria KLEBSIELLA PNEUMONIAE  Final      Susceptibility   Klebsiella pneumoniae - MIC*    AMPICILLIN 16 RESISTANT Resistant     CEFAZOLIN <=4 SENSITIVE Sensitive     CEFTRIAXONE <=1 SENSITIVE Sensitive     CIPROFLOXACIN <=0.25 SENSITIVE Sensitive     GENTAMICIN <=1 SENSITIVE Sensitive     IMIPENEM <=0.25 SENSITIVE Sensitive     NITROFURANTOIN  32 SENSITIVE Sensitive     TRIMETH/SULFA <=20 SENSITIVE Sensitive     AMPICILLIN/SULBACTAM 4 SENSITIVE Sensitive     PIP/TAZO <=4 SENSITIVE Sensitive     * >=100,000 COLONIES/mL KLEBSIELLA PNEUMONIAE   Proteus mirabilis - MIC*    AMPICILLIN <=2 SENSITIVE Sensitive     CEFAZOLIN <=4 SENSITIVE Sensitive     CEFTRIAXONE <=1 SENSITIVE Sensitive     CIPROFLOXACIN <=0.25 SENSITIVE Sensitive     GENTAMICIN <=1 SENSITIVE Sensitive     IMIPENEM 1 SENSITIVE Sensitive     NITROFURANTOIN 256 RESISTANT Resistant     TRIMETH/SULFA <=20 SENSITIVE Sensitive     AMPICILLIN/SULBACTAM <=2 SENSITIVE Sensitive     PIP/TAZO <=4 SENSITIVE Sensitive     * >=100,000 COLONIES/mL PROTEUS MIRABILIS     Scheduled Meds: . amLODipine  10 mg Oral Daily  . anastrozole  1 mg Oral Daily  . antiseptic oral rinse  7 mL Mouth Rinse q12n4p  . azithromycin  250 mg Intravenous Q24H  . benzonatate  200 mg Oral BID  . ceFEPime (MAXIPIME) IV  1 g Intravenous Q12H  . chlorhexidine  15 mL Mouth Rinse BID  . clopidogrel  75 mg Oral Daily  . vitamin B-12  500 mcg Oral Daily  . dextromethorphan-guaiFENesin  1 tablet Oral BID  . ezetimibe  10 mg Oral Daily  . feeding supplement (GLUCERNA SHAKE)  237 mL Oral TID BM  . insulin aspart  0-9 Units Subcutaneous TID WC  . insulin detemir  10 Units Subcutaneous Q1200  . levothyroxine  75 mcg Oral QAC breakfast  . vancomycin  1,000 mg Intravenous Q24H

## 2016-01-30 NOTE — Consult Note (Signed)
Consultation Note Date: 01/30/2016   Patient Name: Molly [name]  DOB: [birth year]  MRN: [id number]  Age / Sex: 82 y.o., female  PCP: Antony Blackbird, MD Referring Physician: Robbie Lis, MD  Reason for Consultation: Establishing goals of care  Clinical Assessment/Narrative: 82 year old female with past medical history of recurrent UTIs, breast cancer with bony metastases, history of CVA with residual right hemiparesis, diabetes, dyslipidemia, hypothyroidism, hypertension who currently resides at a skilled nursing facility presented with cough productive of a clear phlegm for approximately 2 weeks. Denied any subjective fever, chills, or sweats on admission. Imaging revealed left lower lobe opacity consistent with pneumonia as well as leukocytosis. Empirically started on vancomycin & cefepime. She's had clinical decline over the past 48 hours pulmonary was consulted to see her as well. Plan for addition of azithromycin. Palliative was consulted for goals of care.  I met today with Molly [name] and her daughter. She reports the most important things to her her feeling better and her family.  Her daughter reports physicians have been doing a good job talking with her and explaining things.  She understands that her mother has continued to worsen and is now requiring Ventimask to keep oxygen saturation above 90%. She also understands that there are limited additional interventions that are likely to be beneficial.  Contacts/Participants in Discussion: Patient and her daughter Primary Decision Maker: Patient   Relationship to Patient self  HCPOA: None on chart   SUMMARY OF RECOMMENDATIONS - Patient is DO NOT RESUSCITATE. - Plan to continue with current therapies for another 24-48 hours to see if she responds. - We discussed what pathways forward look like including continued care versus shift to comfort care she does not  respond to new interventions including addition of azithromycin. She and her daughter both want to continue with current therapies and reassess her clinical situation over the next day or so. If she is not improving with current interventions, we discussed option of transition to hospice and I think should be a good candidate for residential hospice support for end-of-life care. - I spoke with him at length about dyspnea and the use of opioids for relief.  Her daughter reports that she has bad reactions to opioids and she and the patient do not want to do trial of them at this time. Will continue to assess this with patient and her family tomorrow.  Code Status/Advance Care Planning: DNR    Code Status Orders        Start     Ordered   01/28/16 1832  Do not attempt resuscitation (DNR)   Continuous    Question Answer Comment  In the event of cardiac or respiratory ARREST Do not call a "code blue"   In the event of cardiac or respiratory ARREST Do not perform Intubation, CPR, defibrillation or ACLS   In the event of cardiac or respiratory ARREST Use medication by any route, position, wound care, and other measures to relive pain and suffering. May use oxygen, suction and manual treatment of airway obstruction as needed for comfort.      01/28/16 1832    Code Status History    Date Active Date Inactive Code Status Order ID Comments User Context   01/26/2016 12:07 PM 01/28/2016  6:32 PM Full Code [id number]  Robbie Lis, MD ED   01/26/2016 12:02 PM 01/26/2016 12:07 PM DNR [id number]  Robbie Lis, MD ED   08/29/2015  4:38 PM 09/02/2015  4:50 PM DNR [id number]  Melton Alar, PA-C Inpatient   08/15/2015  2:58 PM 08/18/2015  8:44 PM DNR [id number]  Melton Alar, PA-C Inpatient   08/10/2015  3:21 AM 08/15/2015  2:58 PM Full Code [id number]  Phillips Grout, MD Inpatient   07/29/2015  1:21 PM 08/02/2015  8:55 PM Full Code [id number]  Marliss Coots, PA-C Inpatient   06/19/2015  5:58 PM 06/21/2015  2:52 PM Full  Code [id number]  Kathie Dike, MD ED   05/11/2015  4:11 PM 05/14/2015  4:22 PM Full Code [id number]  Samella Parr, NP Inpatient   07/07/2014 12:14 AM 07/11/2014  3:06 PM Full Code [id number]  Rise Patience, MD Inpatient     Symptom Management:   I think she may benefit from addition of low-dose opioid for symptomatic management dyspnea. She and her daughter reports not being interested in this intervention at this time.  "It makes me crazy."  Palliative Prophylaxis:   Aspiration, Bowel Regimen, Delirium Protocol and Frequent Pain Assessment  Psycho-social/Spiritual:  Support System: [place] Desire for further Chaplaincy support: Did not address this visit Additional Recommendations: Education on Hospice  Prognosis: Unable to determine. If her clinical condition continues to decline despite current therapies, I feel that her prognosis would likely be a matter of days.  If decision made to focus on comfort, I believe she will have continued symptoms that need to be managed aggressively. She is high risk for acute decompensation. I think that she would be well served by placement at residential hospice facility for end-of-life care if comfort care approach comes a plan of care.  Discharge Planning: To be determined   Chief Complaint/ Primary Diagnoses: Present on Admission:  . HCAP (healthcare-associated pneumonia) . Breast cancer metastasized to bone ([place]) . Cerebrovascular accident (CVA) due to stenosis of left carotid artery ([place]) . Essential hypertension . Leukocytosis . Left lower lobe pneumonia . UTI (lower urinary tract infection) . Hypothyroidism . Dyslipidemia associated with type 2 diabetes mellitus ([place])  I have reviewed the medical record, interviewed the patient and family, and examined the patient. The following aspects are pertinent.  Past Medical History  Diagnosis Date  . Labile hypertension   . Aortic stenosis     a. Mild by echo 07/2015.  Marland Kitchen Hypothyroidism   .  Anemia   . Hypercholesterolemia   . Heart murmur   . Type II diabetes mellitus ([place])   . Arthritis     "back; knees, hands" (05/11/2015)  . Breast cancer, right breast ([place])     "cells went into the blood and caused her hip to break" (05/11/2015)  . Stroke (cerebrum) ([place])     a. Stroke 07/2015 - received TPA and placed on DAPT; admit c/b diverticular bleed, strokes felt 2/2 hypoperfusion in setting of low BP with severe intracranial vascular disease.  . Intracranial vascular stenosis     a. severe bilateral intracranial vascular stenosis.  Marland Kitchen History of GI diverticular bleed 07/2015   Social History   Social History  . Marital Status: Widowed    Spouse Name: N/A  . Number of Children: N/A  . Years of Education: N/A   Social History Main Topics  . Smoking status: Never Smoker   . Smokeless tobacco: Never Used  . Alcohol Use: No  . Drug Use: No  . Sexual Activity: Not Asked   Other Topics Concern  . None   Social History Narrative   Family History  Problem Relation Age of Onset  . Hypertension Mother   .  Heart attack Father   . Hypertension Father   . Diabetes Sister   . Hypertension Sister   . Liver cancer Sister   . Heart attack Brother   . Hypertension Brother   . Diabetes Brother   . Diabetes Sister   . Hypertension Sister    Scheduled Meds: . amLODipine  10 mg Oral Daily  . anastrozole  1 mg Oral Daily  . antiseptic oral rinse  7 mL Mouth Rinse q12n4p  . azithromycin  250 mg Intravenous Q24H  . benzonatate  200 mg Oral BID  . ceFEPime (MAXIPIME) IV  1 g Intravenous Q12H  . chlorhexidine  15 mL Mouth Rinse BID  . clopidogrel  75 mg Oral Daily  . vitamin B-12  500 mcg Oral Daily  . dextromethorphan-guaiFENesin  1 tablet Oral BID  . ezetimibe  10 mg Oral Daily  . feeding supplement (GLUCERNA SHAKE)  237 mL Oral TID BM  . insulin aspart  0-9 Units Subcutaneous TID WC  . insulin detemir  10 Units Subcutaneous Q1200  . levothyroxine  75 mcg Oral QAC breakfast    . vancomycin  1,000 mg Intravenous Q24H   Continuous Infusions:  PRN Meds:.acetaminophen (TYLENOL) oral liquid 160 mg/5 mL, alum & mag hydroxide-simeth, guaiFENesin-dextromethorphan, ipratropium-albuterol, ondansetron (ZOFRAN) IV, polyethylene glycol Medications Prior to Admission:  Prior to Admission medications   Medication Sig Start Date End Date Taking? Authorizing Provider  alum & mag hydroxide-simeth (MAALOX/MYLANTA) 200-200-20 MG/5ML suspension Take 30 mLs by mouth every 6 (six) hours as needed for indigestion or heartburn (dyspepsia). 09/02/15  Yes Maryann Mikhail, DO  amLODipine (NORVASC) 10 MG tablet Take 1 tablet (10 mg total) by mouth daily. 08/19/15  Yes Donzetta Starch, NP  anastrozole (ARIMIDEX) 1 MG tablet TAKE 1 TABLET BY MOUTH EVERY DAY 02/28/15  Yes Ladell Pier, MD  atenolol (TENORMIN) 25 MG tablet Take 1 tablet (25 mg total) by mouth 3 (three) times daily. 09/02/15  Yes Maryann Mikhail, DO  clopidogrel (PLAVIX) 75 MG tablet Take 1 tablet (75 mg total) by mouth daily. 09/02/15  Yes Maryann Mikhail, DO  ezetimibe (ZETIA) 10 MG tablet Take 1 tablet (10 mg total) by mouth daily. 09/07/14  Yes Darlin Coco, MD  ferrous sulfate 325 (65 FE) MG tablet Take 325 mg by mouth daily with breakfast.   Yes Historical Provider, MD  guaiFENesin-dextromethorphan (ROBITUSSIN DM) 100-10 MG/5ML syrup Take 15 mLs by mouth every 4 (four) hours as needed for cough.   Yes Historical Provider, MD  Insulin Detemir (LEVEMIR FLEXTOUCH) 100 UNIT/ML Pen Inject 10 Units into the skin daily. At noon   Yes Historical Provider, MD  irbesartan (AVAPRO) 300 MG tablet Take 1 tablet (300 mg total) by mouth daily. 08/19/15  Yes Donzetta Starch, NP  levothyroxine (SYNTHROID, LEVOTHROID) 75 MCG tablet Take 75 mcg by mouth daily before breakfast.    Yes Historical Provider, MD  NUTRITIONAL SUPPLEMENT LIQD Take 1 Dose by mouth 2 (two) times daily with a meal. [facility] Shake with lunch and dinner   Yes Historical  Provider, MD  polyethylene glycol (MIRALAX / GLYCOLAX) packet Take 17 g by mouth daily as needed for mild constipation. 09/02/15  Yes Maryann Mikhail, DO  simethicone (MYLICON) 40 TK/1.6WF drops Take 40 mg by mouth 3 (three) times daily as needed for flatulence.   Yes Historical Provider, MD  vitamin B-12 500 MCG tablet Take 1 tablet (500 mcg total) by mouth daily with lunch. 09/02/15  Yes Cristal Ford, DO  ACCU-CHEK SMARTVIEW test strip  06/13/15   Historical Provider, MD   Allergies  Allergen Reactions  . Amlodipine Swelling    Currently taking exforge, separate med-Amlodipiine caused lower leg edema  . Contrast Media [Iodinated Diagnostic Agents] Nausea Only and Other (See Comments)    Patient experienced chills, nausea, hypothermic symptoms, also weird feelings  . Hctz [Hydrochlorothiazide] Other (See Comments)    Severe hypotension  . Hydralazine Other (See Comments)    Severe hypotension-suddenly and drastically dropped BP  . Metoprolol Other (See Comments)    Severe hypotension-suddenly and drastically dropped BP, same as hydralazine  . Codeine Other (See Comments)    Makes her feel "crazy."  . Demerol Other (See Comments)    Makes her feel "crazy."  . Librium Other (See Comments)    Makes her feel "crazy."  . Lipitor [Atorvastatin Calcium] Other (See Comments)    Makes her feel "crazy."    Review of Systems  Constitutional: Positive for appetite change and fatigue.  Respiratory: Positive for cough, shortness of breath and wheezing.   Musculoskeletal: Positive for back pain.  Psychiatric/Behavioral: Positive for confusion.    Physical Exam General: Awake. Alert. Ill-appearing elderly female. Daughter at bedside.  Integument: Warm & dry.  HEENT: No scleral injection or icterus. Tacky mucus membranes.  Cardiovascular: Regular rate. No edema. No appreciable JVD.  Pulmonary: Decreased breath sounds in left base. Mild increase in work of breathing on NRB  mask. Abdomen: Soft. Normal bowel sounds. Nondistended. Grossly nontender. Musculoskeletal: No joint deformity or effusion appreciated. Weak on her right side. Neurological: Residual weakness on her right from prior CVA. Periods of altered mentation. Psychiatric: Difficult to assess given her altered mentation.   Vital Signs: BP 152/61 mmHg  Pulse 79  Temp(Src) 99.1 F (37.3 C) (Axillary)  Resp 20  Ht _0  (1.626 m)  Wt 69.854 kg (154 lb)  BMI 26.42 kg/m2  SpO2 90%  SpO2: SpO2: 90 % O2 Device:SpO2: 90 % O2 Flow Rate: .O2 Flow Rate (L/min): 15 L/min  IO: Intake/output summary:  Intake/Output Summary (Last 24 hours) at 01/30/16 0741 Last data filed at 01/30/16 4174  Gross per 24 hour  Intake  734.8 ml  Output    690 ml  Net   44.8 ml    LBM: Last BM Date: 01/29/16 Baseline Weight: Weight: 69.854 kg (154 lb) Most recent weight: Weight: 69.854 kg (154 lb)      Palliative Assessment/Data:  Flowsheet Rows        Most Recent Value   Intake Tab    Referral Department  Critical care   Unit at Time of Referral  Oncology Unit   Palliative Care Primary Diagnosis  Sepsis/Infectious Disease   Date Notified  01/29/16   Palliative Care Type  New Palliative care   Date of Admission  01/26/16   Date first seen by Palliative Care  01/30/16   # of days Palliative referral response time  1 Day(s)   # of days IP prior to Palliative referral  3   Clinical Assessment    Palliative Performance Scale Score  40%   Pain Max last 24 hours  4   Pain Min Last 24 hours  0   Dyspnea Max Last 24 Hours  10   Dyspnea Min Last 24 hours  4   Psychosocial & Spiritual Assessment    Palliative Care Outcomes    Patient/Family meeting held?  Yes   Who was at the meeting?  Patient, daughter  Palliative Care Outcomes  Provided psychosocial or spiritual support, Clarified goals of care      Additional Data Reviewed:  CBC:    Component Value Date/Time   WBC 31.7* 01/27/2016 0343   WBC 24.8*  02/07/2015 1432   HGB 11.2* 01/27/2016 0343   HGB 12.5 02/07/2015 1432   HCT 32.6* 01/27/2016 0343   HCT 38.0 02/07/2015 1432   PLT 317 01/27/2016 0343   PLT 336 02/07/2015 1432   MCV 87.2 01/27/2016 0343   MCV 90.5 02/07/2015 1432   NEUTROABS 21.0* 01/26/2016 1452   NEUTROABS 10.3* 02/07/2015 1432   LYMPHSABS 14.5* 01/26/2016 1452   LYMPHSABS 13.2* 02/07/2015 1432   MONOABS 1.4* 01/26/2016 1452   MONOABS 1.0* 02/07/2015 1432   EOSABS 0.0 01/26/2016 1452   EOSABS 0.3 02/07/2015 1432   BASOSABS 0.1 01/26/2016 1452   BASOSABS 0.1 02/07/2015 1432   Comprehensive Metabolic Panel:    Component Value Date/Time   NA 131* 01/27/2016 0343   NA 136 09/22/2014 1426   K 3.6 01/27/2016 0343   K 3.8 09/22/2014 1426   CL 103 01/27/2016 0343   CO2 21* 01/27/2016 0343   CO2 21* 09/22/2014 1426   BUN 19 01/27/2016 0343   BUN 11.4 09/22/2014 1426   CREATININE 0.62 01/27/2016 0343   CREATININE 0.9 09/22/2014 1426   GLUCOSE 162* 01/27/2016 0343   GLUCOSE 308* 09/22/2014 1426   CALCIUM 8.4* 01/27/2016 0343   CALCIUM 9.5 09/22/2014 1426   AST 8* 01/27/2016 0343   AST 14 09/22/2014 1426   ALT 7* 01/27/2016 0343   ALT 17 09/22/2014 1426   ALKPHOS 76 01/27/2016 0343   ALKPHOS 90 09/22/2014 1426   BILITOT 0.6 01/27/2016 0343   BILITOT 0.23 09/22/2014 1426   PROT 5.4* 01/27/2016 0343   PROT 6.7 09/22/2014 1426   ALBUMIN 2.5* 01/27/2016 0343   ALBUMIN 3.4* 09/22/2014 1426     Time In: 1840 Time Out: 1955 Time Total: 75 Greater than 50%  of this time was spent counseling and coordinating care related to the above assessment and plan.  Signed by: Micheline Rough, MD  Micheline Rough, MD  01/30/2016, 7:41 AM  Please contact Palliative Medicine Team phone at [phone] for questions and concerns.

## 2016-01-31 DIAGNOSIS — C7951 Secondary malignant neoplasm of bone: Secondary | ICD-10-CM

## 2016-01-31 DIAGNOSIS — C50911 Malignant neoplasm of unspecified site of right female breast: Secondary | ICD-10-CM

## 2016-01-31 DIAGNOSIS — R531 Weakness: Secondary | ICD-10-CM

## 2016-01-31 DIAGNOSIS — Z79811 Long term (current) use of aromatase inhibitors: Secondary | ICD-10-CM

## 2016-01-31 DIAGNOSIS — E1359 Other specified diabetes mellitus with other circulatory complications: Secondary | ICD-10-CM

## 2016-01-31 LAB — CULTURE, BLOOD (ROUTINE X 2)
CULTURE: NO GROWTH
CULTURE: NO GROWTH

## 2016-01-31 LAB — GLUCOSE, CAPILLARY
GLUCOSE-CAPILLARY: 154 mg/dL — AB (ref 65–99)
Glucose-Capillary: 154 mg/dL — ABNORMAL HIGH (ref 65–99)
Glucose-Capillary: 123 mg/dL — ABNORMAL HIGH (ref 65–99)
Glucose-Capillary: 128 mg/dL — ABNORMAL HIGH (ref 65–99)

## 2016-01-31 LAB — LEGIONELLA PNEUMOPHILA SEROGP 1 UR AG
L. pneumophila Serogp 1 Ur Ag: NEGATIVE
L. pneumophila Serogp 1 Ur Ag: NEGATIVE

## 2016-01-31 LAB — PHOSPHORUS: PHOSPHORUS: 2.6 mg/dL (ref 2.5–4.6)

## 2016-01-31 LAB — MAGNESIUM: Magnesium: 1.5 mg/dL — ABNORMAL LOW (ref 1.7–2.4)

## 2016-01-31 MED ORDER — SALINE SPRAY 0.65 % NA SOLN
1.0000 | Freq: Two times a day (BID) | NASAL | Status: DC
  Administered 2016-01-31 – 2016-02-01 (×2): 1 via NASAL
  Filled 2016-01-31: qty 44

## 2016-01-31 MED ORDER — IPRATROPIUM-ALBUTEROL 0.5-2.5 (3) MG/3ML IN SOLN: 3.0000 mL | RESPIRATORY_TRACT | Status: DC | PRN

## 2016-01-31 MED ORDER — DM-GUAIFENESIN ER 30-600 MG PO TB12: 1.0000 | ORAL_TABLET | Freq: Two times a day (BID) | ORAL | Status: DC | PRN

## 2016-01-31 MED ORDER — VITAMINS A & D EX OINT
TOPICAL_OINTMENT | CUTANEOUS | Status: AC
  Filled 2016-01-31: qty 5

## 2016-01-31 MED ORDER — MORPHINE SULFATE (PF) 2 MG/ML IV SOLN
1.0000 mg | INTRAVENOUS | Status: DC | PRN
  Administered 2016-01-31 – 2016-02-01 (×3): 1 mg via INTRAVENOUS
  Filled 2016-01-31 (×3): qty 1

## 2016-01-31 MED ORDER — CHLORHEXIDINE GLUCONATE 0.12 % MT SOLN: 15.0000 mL | Freq: Two times a day (BID) | OROMUCOSAL | Status: AC

## 2016-01-31 MED ORDER — GLUCERNA SHAKE PO LIQD: 237.0000 mL | Freq: Three times a day (TID) | ORAL | Status: AC

## 2016-01-31 MED ORDER — INSULIN ASPART 100 UNIT/ML ~~LOC~~ SOLN: 0.0000 [IU] | Freq: Three times a day (TID) | SUBCUTANEOUS | Status: DC

## 2016-01-31 MED ORDER — CETYLPYRIDINIUM CHLORIDE 0.05 % MT LIQD: 7.0000 mL | Freq: Two times a day (BID) | OROMUCOSAL | Status: AC

## 2016-01-31 MED ORDER — MORPHINE SULFATE (PF) 2 MG/ML IV SOLN
INTRAVENOUS | Status: AC
  Filled 2016-01-31: qty 1

## 2016-01-31 NOTE — Progress Notes (Signed)
Name: Molly [name] MRN: [id number] DOB: Jun 21, 1923    ADMISSION DATE:  01/26/2016 CONSULTATION DATE:  01/29/2016  REFERRING MD :  Leisa Lenz, M.D. / Hospitalist  CHIEF COMPLAINT:  Acute Hypoxic Respiratory Failure  BRIEF PATIENT DESCRIPTION:   82 year old female currently living at a skilled nursing facility. Presents with a two-week history of cough along with leukocytosis. Started empirically on vancomycin & cefepime. Infectious workup thus far has been negative. Patient reports dyspnea and cough continuing to worsen. She reports cough remains nonproductive. Denies any fever, chills, or sweats. She does report a "tightness" in her chest but denies any frank chest pain. She did have nausea and vomiting yesterday but denies any abdominal pain. She reports poor oral intake. She denies any sore throat or sinus congestion. No recent travel. Patient does have somewhat altered mentation making an accurate history difficult.   STUDIES:  Port CXR 3/19:  Personally reviewed by me. Significant worsening of the left lower and mid lung opacities when compared with previous imaging.  MICROBIOLOGY: Blood Ctx x2 3/16>>> Urine Ctx 3/16:  Proteus & Klebsiella  Influenza PCR 3/16:  Negative Respiratory Viral Panel PCR 3/16>>> HIV 3/17:  Negative  Urine Strep Ag 3/16:  Negative   ANTIBIOTICS: Cefepime 3/16>>> Vancomycin 3/16>>> Azithro 3/19 >>     SIGNIFICANT EVENTS  3/16 - Admit  SUBJECTIVE:  SOB. Inc WOB over last 24 hrs.   VITAL SIGNS: Temp:  [98.5 F (36.9 C)-99.7 F (37.6 C)] 98.5 F (36.9 C) (03/21 0554) Pulse Rate:  [69-75] 69 (03/21 0554) Resp:  [18-20] 18 (03/21 0554) BP: (149-169)/(44-63) 158/63 mmHg (03/21 0554) SpO2:  [92 %-95 %] 92 % (03/21 0554)  PHYSICAL EXAMINATION: General:  Eldderly female. resp distress. On NRB mask Integument:  Warm & dry. No rash on exposed skin.  Lymphatics:  No appreciated cervical or supraclavicular lymphadenoapthy. HEENT:  No  scleral injection or icterus. Tacky mucus membranes.  Cardiovascular:  Regular rate. No edema. No appreciable JVD.  Pulmonary:  Decreased breath sounds in left lung with egophony. Mildly increased work of breathing on NRB mask. Abdomen: Soft. Normal bowel sounds. Nondistended. Grossly nontender. Musculoskeletal:  No joint deformity or effusion appreciated. Weak on her right side. Neurological:  Residual weakness on her right from prior CVA. Oritned x 3 now Psychiatric:  Pleasant    PULMONARY No results for input(s): PHART, PCO2ART, PO2ART, HCO3, TCO2, O2SAT in the last 168 hours.  Invalid input(s): PCO2, PO2  CBC  Recent Labs Lab 01/26/16 0910 01/26/16 1452 01/27/16 0343  HGB 13.1 12.7 11.2*  HCT 39.0 38.6 32.6*  WBC 33.1* 37.0* 31.7*  PLT 353 359 317    COAGULATION No results for input(s): INR in the last 168 hours.  CARDIAC    Recent Labs Lab 01/26/16 0910  TROPONINI <0.03   No results for input(s): PROBNP in the last 168 hours.   CHEMISTRY  Recent Labs Lab 01/26/16 0910 01/27/16 0343 01/30/16 0937 01/31/16 0401  NA 130* 131*  --   --   K 3.7 3.6  --   --   CL 98* 103  --   --   CO2 22 21*  --   --   GLUCOSE 197* 162*  --   --   BUN 16 19  --   --   CREATININE 0.57 0.62 0.53  --   CALCIUM 8.8* 8.4*  --   --   MG  --   --   --  1.5*  PHOS  --   --   --  2.6   Estimated Creatinine Clearance: 44.9 mL/min (by C-G formula based on Cr of 0.53).   LIVER  Recent Labs Lab 01/26/16 0910 01/27/16 0343  AST 11* 8*  ALT 10* 7*  ALKPHOS 79 76  BILITOT 0.8 0.6  PROT 6.0* 5.4*  ALBUMIN 3.0* 2.5*     INFECTIOUS No results for input(s): LATICACIDVEN, PROCALCITON in the last 168 hours.   ENDOCRINE CBG (last 3)   Recent Labs  01/30/16 1808 01/30/16 2055 01/31/16 0802  GLUCAP 151* 128* 154*         IMAGING x48h  - image(s) personally visualized  -   highlighted in bold Ct Chest Wo Contrast  01/30/2016  CLINICAL DATA:  Metastatic breast  cancer. Inpatient. Acute respiratory failure on antibiotic therapy for pneumonia. Persistent cough. Leukocytosis. Worsening left lung opacities on chest radiograph . EXAM: CT CHEST WITHOUT CONTRAST TECHNIQUE: Multidetector CT imaging of the chest was performed following the standard protocol without IV contrast. COMPARISON:  Chest radiograph from 1 day prior. No prior chest CT. 07/22/2015 CT abdomen. FINDINGS: Mediastinum/Nodes: Top-normal heart size . Mild pericardial fluid/thickening. Extensive coronary atherosclerosis. Great vessels are normal in course and caliber. There is a large hypodense 5.4 x 3.5 cm right thyroid lobe nodule (series 2/image 4), which displaces the trachea to the left and mildly narrows the tracheal lumen at the level of the thyroid. Normal esophagus. No right axillary lymphadenopathy. There are multiple mildly enlarged left axillary, left subpectoral and left retro scapular lymph nodes, largest a 1.0 cm left retropectoral node (series 2/image 4), a 1.0 cm left axillary node (series 2/ image 12) and a 1.0 cm left retroscapular node (series 2/image 14) . Multiple mildly enlarged bilateral paratracheal nodes, largest a 1.3 cm right lower paratracheal node (series 2/ image 19). Mildly-to-moderately enlarged 1.8 cm subcarinal node (series 2/ image 24). The hila are poorly visualized on this noncontrast study. Lungs/Pleura: No pneumothorax. Trace layering right pleural effusion. Small to moderate left pleural effusion, which accumulates predominantly within the left major fissure and posterior apical left pleural space, which could indicate loculation. Mild dependent right lower lobe atelectasis. Near complete consolidation with air bronchograms in the lingula with lesser patchy consolidation and ground-glass opacity in the more superior left upper lobe. Complete consolidation with air bronchograms and some volume loss in the left lower lobe. Upper abdomen: Peripherally calcified 1.4 cm hypodense  structure near the porta hepatis may represent an aneurysm, possibly from the hepatic artery, unchanged since 07/22/2015 CT. Musculoskeletal: No aggressive appearing focal osseous lesions. Moderate degenerative changes in the thoracic spine. 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 IMPRESSION: 1. Complete left lower lobe consolidation with air bronchograms and some volume loss. Near complete lingular consolidation with air bronchograms. Patchy consolidation and ground-glass opacity in the more superior left upper lobe. Findings are most consistent with a multilobar pneumonia, with an underlying pulmonary nodule or lung mass not excluded. 2. Small to moderate left pleural effusion accumulating predominantly within the left major fissure and posterior apical left pleural space, which could indicate loculation. Trace layering right pleural effusion. 3. Large 5.4 cm right thyroid lobe nodule, which displaces the trachea to the left and mildly narrows the tracheal lumen. 4. Nonspecific left axillary, left retroscapular, left subpectoral and mediastinal lymphadenopathy. 5. Mild pericardial fluid/thickening. 6. Extensive coronary atherosclerosis. 7. 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 Electronically Signed   By: Ilona Sorrel M.D.   On: 01/30/2016 14:06  ASSESSMENT / PLAN:   82 year old female admitted with acute hypoxic respiratory failure secondary to healthcare associated pneumonia. Patient continues to clinically worsen. Chest CT scan with multilobar PNA on the Left.  Pt continues to worsen on NRBM. Has resp distress. She had extensive discussion with primary MD and Palliative MD and Oncologist. It was decided that pt did not want anything more and she wanted to be comfort care and wanted to go to [facility].   Cont o2 for now. Cont Abx. PCCM will sign off. Call back if with questions.    Anticipate d/c today.   Monica Becton, MD 01/31/2016, 11:23 AM [facility] Pager [phone] After 3 pm or if no answer, call [phone]

## 2016-01-31 NOTE — Discharge Instructions (Signed)
Hospice °Hospice is a service that is designed to provide people who are terminally ill and their families with medical, spiritual, and psychological support. Its aim is to improve your quality of life by keeping you as alert and comfortable as possible. Hospice is performed by a team of health care professionals and volunteers who: °· Help keep you comfortable. Hospice can be provided in your home or in a homelike setting. The hospice staff works with your family and friends to help meet your needs. You will enjoy the support of loved ones by receiving much of your basic care from family and friends. °· Provide pain relief and manage your symptoms. The staff supply all necessary medicines and equipment. °· Provide companionship when you are alone. °· Allow you and your family to rest. They may do light housekeeping, prepare meals, and run errands. °· Provide counseling. They will make sure your emotional, spiritual, and social needs and those of your family are being met. °· Provide spiritual care. Spiritual care is individualized to meet your needs and your family's needs. It may involve helping you look at what death means to you, say goodbye, or perform a specific religious ceremony or ritual. °Hospice teams often include: °· A nurse. °· A doctor. °· Social workers. °· Religious leaders (such as a chaplain). °· Trained volunteers. °WHEN SHOULD HOSPICE CARE BEGIN? °Most people who use hospice are believed to have fewer than 6 months to live. Your family and health care providers can help you decide when hospice services should begin. If your condition improves, you may discontinue the program. °WHAT SHOULD I CONSIDER BEFORE SELECTING A PROGRAM? °Most hospice programs are run by nonprofit, independent organizations. Some are affiliated with hospitals, nursing homes, or home health care agencies. Hospice programs can take place in the home or at a hospice center, hospital, or skilled nursing facility. When choosing  a hospice program, ask the following questions: °· What services are available to me? °· What services are offered to my loved ones? °· How involved are my loved ones? °· How involved is my health care provider? °· Who makes up the hospice care team? How are they trained or screened? °· How will my pain and symptoms be managed? °· If my circumstances change, can the services be provided in a different setting, such as my home or in the hospital? °· Is the program reviewed and licensed by the state or certified in some other way? °WHERE CAN I LEARN MORE ABOUT HOSPICE? °You can learn about existing hospice programs in your area from your health care providers. You can also read more about hospice online. The websites of the following organizations contain helpful information: °· The National Hospice and Palliative Care Organization (NHPCO). °· The Hospice Association of America (HAA). °· The Hospice Education Institute. °· The American Cancer Society (ACS). °· Hospice Net. °  °This information is not intended to replace advice given to you by your health care provider. Make sure you discuss any questions you have with your health care provider. °  °Document Released: 02/15/2004 Document Revised: 11/03/2013 Document Reviewed: 09/08/2013 °Elsevier Interactive Patient Education ©2016 [employer]. ° °

## 2016-01-31 NOTE — Progress Notes (Signed)
RN called to room by daughter with patient reporting she was having difficulty breathing.  O2 sat 92-95% on NRB which is the same it has been.  Discussed use of Morphine to reduce work of breathing which initially patient declined because she did not want to be sedate.  We discussed at least a trial of MS to see how this effects her as she has not tried it.  Patient agreed to try it.  Text sent to primary MD requesting Morphine for dyspnea.

## 2016-01-31 NOTE — Progress Notes (Signed)
IP PROGRESS NOTE  Subjective:   Molly [name] is well-known to me with a history of bone only metastatic breast cancer and CLL. She has been residing at a skilled nursing facility after suffering a stroke in September 2016. She was admitted with a productive cough 01/26/2016. She was diagnosed with left lung pneumonia. She has developed progressive respiratory failure during this admission with a CT 01/30/2016 confirming multilobar pneumonia.  Ms. Moroyoqui has been evaluated by the critical care service, internal medicine, and palliative medicine. I was asked to see her by the palliative medicine service.  She complains of "hurting "all over. She is hungry this morning. She has decided to pursue Hospice care and would like to be transferred to [facility].  Objective: Vital signs in last 24 hours: Blood pressure 160/53, pulse 74, temperature 97.5 F (36.4 C), temperature source Axillary, resp. rate 20, height 5\' 4"  (1.626 m), weight 154 lb (69.854 kg), SpO2 94 %.  Intake/Output from previous day: 03/20 0701 - 03/21 0700 In: 355 [P.O.:355] Out: 900 [Urine:900]  Physical Exam:  HEENT: No thrush Lungs: Diminished breath sounds throughout the left chest, no respiratory distress Cardiac: Regular rate and rhythm Abdomen: No hepatosplenomegaly Extremities: No leg edema Neurologic: Alert, follows commands, she moves the right foot, otherwise not moving the right side. Breast: Status post right lumpectomy. No evidence for local tumor recurrence. Lymph nodes: No cervical, supraclavicular, or right axillary nodes. Soft mobile 1 cm left axillary node    Lab Results: No results for input(s): WBC, HGB, HCT, PLT in the last 72 hours.  BMET  Recent Labs  01/30/16 0937  CREATININE 0.53    Studies/Results: Ct Chest Wo Contrast  01/30/2016  CLINICAL DATA:  Metastatic breast cancer. Inpatient. Acute respiratory failure on antibiotic therapy for pneumonia. Persistent cough. Leukocytosis.  Worsening left lung opacities on chest radiograph . EXAM: CT CHEST WITHOUT CONTRAST TECHNIQUE: Multidetector CT imaging of the chest was performed following the standard protocol without IV contrast. COMPARISON:  Chest radiograph from 1 day prior. No prior chest CT. 07/22/2015 CT abdomen. FINDINGS: Mediastinum/Nodes: Top-normal heart size . Mild pericardial fluid/thickening. Extensive coronary atherosclerosis. Great vessels are normal in course and caliber. There is a large hypodense 5.4 x 3.5 cm right thyroid lobe nodule (series 2/image 4), which displaces the trachea to the left and mildly narrows the tracheal lumen at the level of the thyroid. Normal esophagus. No right axillary lymphadenopathy. There are multiple mildly enlarged left axillary, left subpectoral and left retro scapular lymph nodes, largest a 1.0 cm left retropectoral node (series 2/image 4), a 1.0 cm left axillary node (series 2/ image 12) and a 1.0 cm left retroscapular node (series 2/image 14) . Multiple mildly enlarged bilateral paratracheal nodes, largest a 1.3 cm right lower paratracheal node (series 2/ image 19). Mildly-to-moderately enlarged 1.8 cm subcarinal node (series 2/ image 24). The hila are poorly visualized on this noncontrast study. Lungs/Pleura: No pneumothorax. Trace layering right pleural effusion. Small to moderate left pleural effusion, which accumulates predominantly within the left major fissure and posterior apical left pleural space, which could indicate loculation. Mild dependent right lower lobe atelectasis. Near complete consolidation with air bronchograms in the lingula with lesser patchy consolidation and ground-glass opacity in the more superior left upper lobe. Complete consolidation with air bronchograms and some volume loss in the left lower lobe. Upper abdomen: Peripherally calcified 1.4 cm hypodense structure near the porta hepatis may represent an aneurysm, possibly from the hepatic artery, unchanged since  07/22/2015 CT. Musculoskeletal:  No aggressive appearing focal osseous lesions. Moderate degenerative changes in the thoracic spine. 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 IMPRESSION: 1. Complete left lower lobe consolidation with air bronchograms and some volume loss. Near complete lingular consolidation with air bronchograms. Patchy consolidation and ground-glass opacity in the more superior left upper lobe. Findings are most consistent with a multilobar pneumonia, with an underlying pulmonary nodule or lung mass not excluded. 2. Small to moderate left pleural effusion accumulating predominantly within the left major fissure and posterior apical left pleural space, which could indicate loculation. Trace layering right pleural effusion. 3. Large 5.4 cm right thyroid lobe nodule, which displaces the trachea to the left and mildly narrows the tracheal lumen. 4. Nonspecific left axillary, left retroscapular, left subpectoral and mediastinal lymphadenopathy. 5. Mild pericardial fluid/thickening. 6. Extensive coronary atherosclerosis. 7. 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 Electronically Signed   By: Ilona Sorrel M.D.   On: 01/30/2016 14:06    Medications: I have reviewed the patient's current medications.  Assessment/Plan:  1. Metastatic breast cancer-bone only metastatic disease, maintained on Arimidex since December 2011 2. Chronic lymphocytic leukemia-early stage 3. 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 Admission with pneumonia/respiratory failure March 2017  Ms. Coney is admitted with pneumonia. She has developed respiratory failure. Ms. Maund had a poor quality of life prior to this hospital admission. I discussed the situation with Ms. Schlegelmilch and her daughter. She would like to be transferred to [facility] for comfort care measures. It is possible  the left lung consolidation is related to progression of breast cancer, but I think this is unlikely. The small lymph nodes noted on the CT 01/30/2016 are most likely related to CLL.  I agree with the plan for comfort care. I will plan to follow her at [facility] at discharge.       LOS: 5 days   Betsy Coder, MD   01/31/2016, 4:11 PM

## 2016-01-31 NOTE — Discharge Summary (Signed)
Physician Discharge Summary  Molly [name] [id number] DOB: 1922-12-05 DOA: 01/26/2016  PCP: Molly Blackbird, MD  Admit date: 01/26/2016 Discharge date: 01/31/2016  Recommendations for Outpatient Follow-up:  1. Per family request, comfort care and residential hospice placement  Discharge Diagnoses:  Principal Problem:   Generalized weakness Active Problems:   HCAP (healthcare-associated pneumonia)   Leukocytosis   Left lower lobe pneumonia   UTI (lower urinary tract infection)   Breast cancer metastasized to bone [name]   Cerebrovascular accident (CVA) due to stenosis of left carotid artery ([name])   Essential hypertension   Controlled diabetes mellitus with circulatory complication, with long-term current use of insulin (HCC)   Hypothyroidism   Dyslipidemia associated with type 2 diabetes mellitus ([place])   Pressure ulcer   Palliative care encounter   Goals of care, counseling/discussion   Acute respiratory failure ([name])   Dyspnea    Discharge Condition: stable   Diet recommendation: as tolerated   History of present illness:  82 year old female with past medical history of recurrent UTIs, breast cancer with bone metastasis, history of CVA with residual right hemiparesis, diabetes mellitus on insulin, dyslipidemia, hypothyroidism, hypertension who presented from skilled nursing facility where reports of generalized weakness and cough intermittently productive of clear sputum for past 2 weeks prior to this admission. Patient reports having taken Mucinex in skilled nursing facility but without significant symptomatic relief. No fevers.   In ED, patient was hemodynamically stable, afebrile. White blood cell count was 33.1, normal creatinine, normal troponin level. Chest x-ray showed left lower lobe consolidation consistent with pneumonia. She was also found to have UTI. Her urine culture is growing Proteus and Klebsiella. She was started on vanco and cefepime.  I spoke with the  patient and her daughter at the bedside extensively this morning about goals of care. Patient wants to go to be complaints, she is ready for comfort care. She does not want any antibiotics at this point. Even Ventimask has become a burdensome to her and she wants as simplest possible regimen as it can be. Her daughter to reports she is exhausted watching her mother going through this and she is ready for full comfort care.  Hospital Course:    Assessment/Plan:    Principal Problem:  Generalized weakness - Likely in the setting of pneumonia and UTI in addition to chronic illness related to history of CVA and residual right-sided hemiparesis - Comfort care and plan to discharge to residential hospice  Active Problems:  Acute respiratory failure with hypoxia / HCAP (healthcare-associated pneumonia) / Left lower lobe pneumonia / Leukocytosis - Chest x-ray 3/19 showed worsening degree of consolidation along the left lower lung and lingular region  - Stop all antibiotics today per family request, per patient's request - We will continue cough medication, Robitussin as needed - Continue bronchodilators as needed - Influenza negative. Strep pneumonia negative. HIV is nonreactive - Blood cultures negative so far   UTI (lower urinary tract infection) due to Proteus mirabilis and Klebsiella pneumonia  - Urinalysis showed moderate leukocytes, positive nitrites and many bacteria - Urine culture is growing Proteus mirabilis and Klebsiella pneumonia species - No further antibiotics as patient and her daughter prefer comfort care and minimizing medication burden   Breast cancer metastasized to bone [name] - As noted above, stop pretty much all medications including Arimidex    Cerebrovascular accident (CVA) due to stenosis of left carotid artery (HCC) - Residual right sided hemiparesis - Stop the Plavix   Essential hypertension - Stop blood  pressure medications. Family aware and prefers  we minimize medication burden   Controlled diabetes mellitus with circulatory complication, with long-term current use of insulin (HCC) - Use sliding scale insulin on discharge   Hypothyroidism - Stop Synthroid, focus on comfort   Dyslipidemia associated with type 2 diabetes mellitus ([name]) - Stop zetia, focus on comfort   Sacral pressure ulcer - Wound care assessment done - Patient has deep tissue injury to coccyx. Measurement:0.5 cm x 1 cm maroon discoloration to wound bed surrounded by 2 cm blanchable erythema circumferentially - Dressing procedure/placement/frequency:Silicone border foam to sacrococcygeal area. Change every 3 days and PRN soilage.    Severe protein calorie malnutrition - In the setting of chronic illness - Diet as tolerated  DVT prophylaxis:  - SCDs bilaterally    Code Status: DNR/DNI Family Communication: Plan of care discussed with the patient and her daughter at the bedside   IV access:  Peripheral IV  Procedures and diagnostic studies:   Dg Chest 2 View 01/26/2016 Question COPD with LEFT lower lobe consolidation consistent with pneumonia.   Medical Consultants:  PCCM Palliative care   Other Consultants:  WOC PT Nutrition  IAnti-Infectives:   Vanco and cefepime 01/26/2016 --> 01/31/16 Azithromycin 01/29/2016 --> 01/31/16  Signed:  Leisa Lenz, MD  [facility] 01/31/2016, 9:42 AM  Pager #: [phone]  Time spent in minutes: more than 30 minutes   Discharge Exam: Filed Vitals:   01/31/16 0100 01/31/16 0554  BP: 169/57 158/63  Pulse:  69  Temp:  98.5 F (36.9 C)  Resp:  18   Filed Vitals:   01/30/16 0600 01/30/16 1404 01/31/16 0100 01/31/16 0554  BP: 152/61 149/44 169/57 158/63  Pulse: 79 75  69  Temp: 99.1 F (37.3 C) 99.7 F (37.6 C)  98.5 F (36.9 C)  TempSrc: Axillary Axillary  Axillary  Resp: 20 20  18   Height:      Weight:      SpO2: 90% 95%  92%    General: Pt is alert, not in acute  distress Cardiovascular: Regular rate and rhythm, S1/S2 +, no murmurs Respiratory: Diminished on left, no wheezing Abdominal: Soft, non tender, non distended, bowel sounds +, no guarding Extremities: Trace lower extremity pitting edema, no cyanosis, pulses palpable bilaterally DP and PT Neuro: Grossly nonfocal  Discharge Instructions  Discharge Instructions    Call MD for:  difficulty breathing, headache or visual disturbances    Complete by:  As directed      Call MD for:  persistant dizziness or light-headedness    Complete by:  As directed      Call MD for:  persistant nausea and vomiting    Complete by:  As directed      Call MD for:  severe uncontrolled pain    Complete by:  As directed      Diet - low sodium heart healthy    Complete by:  As directed      Increase activity slowly    Complete by:  As directed             Medication List    STOP taking these medications        ACCU-CHEK SMARTVIEW test strip  Generic drug:  glucose blood     amLODipine 10 MG tablet  Commonly known as:  NORVASC     anastrozole 1 MG tablet  Commonly known as:  ARIMIDEX     atenolol 25 MG tablet  Commonly known as:  TENORMIN  clopidogrel 75 MG tablet  Commonly known as:  PLAVIX     cyanocobalamin 500 MCG tablet     ezetimibe 10 MG tablet  Commonly known as:  ZETIA     ferrous sulfate 325 (65 FE) MG tablet     irbesartan 300 MG tablet  Commonly known as:  AVAPRO     LEVEMIR FLEXTOUCH 100 UNIT/ML Pen  Generic drug:  Insulin Detemir     levothyroxine 75 MCG tablet  Commonly known as:  SYNTHROID, LEVOTHROID     polyethylene glycol packet  Commonly known as:  MIRALAX / GLYCOLAX     simethicone 40 MG/0.6ML drops  Commonly known as:  MYLICON      TAKE these medications        alum & mag hydroxide-simeth 200-200-20 MG/5ML suspension  Commonly known as:  MAALOX/MYLANTA  Take 30 mLs by mouth every 6 (six) hours as needed for indigestion or heartburn (dyspepsia).      antiseptic oral rinse 0.05 % Liqd solution  Commonly known as:  CPC / CETYLPYRIDINIUM CHLORIDE 0.05%  7 mLs by Mouth Rinse route 2 times daily at 12 noon and 4 pm.     chlorhexidine 0.12 % solution  Commonly known as:  PERIDEX  15 mLs by Mouth Rinse route 2 (two) times daily.     feeding supplement (GLUCERNA SHAKE) Liqd  Take 237 mLs by mouth 3 (three) times daily between meals.     guaiFENesin-dextromethorphan 100-10 MG/5ML syrup  Commonly known as:  ROBITUSSIN DM  Take 15 mLs by mouth every 4 (four) hours as needed for cough.     dextromethorphan-guaiFENesin 30-600 MG 12hr tablet  Commonly known as:  MUCINEX DM  Take 1 tablet by mouth 2 (two) times daily as needed for cough.     insulin aspart 100 UNIT/ML injection  Commonly known as:  novoLOG  Inject 0-9 Units into the skin 3 (three) times daily with meals.     ipratropium-albuterol 0.5-2.5 (3) MG/3ML Soln  Commonly known as:  DUONEB  Take 3 mLs by nebulization every 2 (two) hours as needed.           Follow-up Information    Follow up with FULP, CAMMIE, MD. Schedule an appointment as soon as possible for a visit in 1 week.   Specialty:  Family Medicine   Why:  Follow up appt after recent hospitalization   Contact information:   [address] [place] [address] [phone]        The results of significant diagnostics from this hospitalization (including imaging, microbiology, ancillary and laboratory) are listed below for reference.    Significant Diagnostic Studies: Dg Chest 2 View  01/26/2016  CLINICAL DATA:  Increased cough and weakness over past few days, history hypertension, type II diabetes mellitus, breast cancer, stroke EXAM: CHEST  2 VIEW COMPARISON:  08/29/2015 FINDINGS: Borderline enlargement of cardiac silhouette. Atherosclerotic calcification aorta. Mediastinal contours and pulmonary vascularity normal. LEFT lower lobe consolidation consistent with pneumonia. Lungs otherwise clear though mildly  hyperinflated. No pleural effusion or pneumothorax. IMPRESSION: Question COPD with LEFT lower lobe consolidation consistent with pneumonia. Electronically Signed   By: Lavonia Dana M.D.   On: 01/26/2016 10:00   Ct Chest Wo Contrast  01/30/2016  CLINICAL DATA:  Metastatic breast cancer. Inpatient. Acute respiratory failure on antibiotic therapy for pneumonia. Persistent cough. Leukocytosis. Worsening left lung opacities on chest radiograph . EXAM: CT CHEST WITHOUT CONTRAST TECHNIQUE: Multidetector CT imaging of the chest was performed following the  standard protocol without IV contrast. COMPARISON:  Chest radiograph from 1 day prior. No prior chest CT. 07/22/2015 CT abdomen. FINDINGS: Mediastinum/Nodes: Top-normal heart size . Mild pericardial fluid/thickening. Extensive coronary atherosclerosis. Great vessels are normal in course and caliber. There is a large hypodense 5.4 x 3.5 cm right thyroid lobe nodule (series 2/image 4), which displaces the trachea to the left and mildly narrows the tracheal lumen at the level of the thyroid. Normal esophagus. No right axillary lymphadenopathy. There are multiple mildly enlarged left axillary, left subpectoral and left retro scapular lymph nodes, largest a 1.0 cm left retropectoral node (series 2/image 4), a 1.0 cm left axillary node (series 2/ image 12) and a 1.0 cm left retroscapular node (series 2/image 14) . Multiple mildly enlarged bilateral paratracheal nodes, largest a 1.3 cm right lower paratracheal node (series 2/ image 19). Mildly-to-moderately enlarged 1.8 cm subcarinal node (series 2/ image 24). The hila are poorly visualized on this noncontrast study. Lungs/Pleura: No pneumothorax. Trace layering right pleural effusion. Small to moderate left pleural effusion, which accumulates predominantly within the left major fissure and posterior apical left pleural space, which could indicate loculation. Mild dependent right lower lobe atelectasis. Near complete  consolidation with air bronchograms in the lingula with lesser patchy consolidation and ground-glass opacity in the more superior left upper lobe. Complete consolidation with air bronchograms and some volume loss in the left lower lobe. Upper abdomen: Peripherally calcified 1.4 cm hypodense structure near the porta hepatis may represent an aneurysm, possibly from the hepatic artery, unchanged since 07/22/2015 CT. Musculoskeletal: No aggressive appearing focal osseous lesions. Moderate degenerative changes in the thoracic spine. 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 IMPRESSION: 1. Complete left lower lobe consolidation with air bronchograms and some volume loss. Near complete lingular consolidation with air bronchograms. Patchy consolidation and ground-glass opacity in the more superior left upper lobe. Findings are most consistent with a multilobar pneumonia, with an underlying pulmonary nodule or lung mass not excluded. 2. Small to moderate left pleural effusion accumulating predominantly within the left major fissure and posterior apical left pleural space, which could indicate loculation. Trace layering right pleural effusion. 3. Large 5.4 cm right thyroid lobe nodule, which displaces the trachea to the left and mildly narrows the tracheal lumen. 4. Nonspecific left axillary, left retroscapular, left subpectoral and mediastinal lymphadenopathy. 5. Mild pericardial fluid/thickening. 6. Extensive coronary atherosclerosis. 7. 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 Electronically Signed   By: Ilona Sorrel M.D.   On: 01/30/2016 14:06   Dg Chest Port 1 View  01/29/2016  CLINICAL DATA:  Shortness of breath and chest pain. History of breast carcinoma EXAM: PORTABLE CHEST 1 VIEW COMPARISON:  January 26, 2016 FINDINGS: There is significant increase in consolidation throughout the left lower lobe and portions  of the lingula. There is a small left effusion. The right lung is clear. Heart is upper normal in size with pulmonary vascularity within normal limits. There is atherosclerotic calcification in the aorta. No bone lesions evident. IMPRESSION: Extensive airspace consolidation throughout the left lower lobe and portions of the lingula. This finding represents a significant change from 2 days prior. There is a small left effusion. Right lung clear. No change in cardiac silhouette. Electronically Signed   By: Lowella Grip III M.D.   On: 01/29/2016 07:54    Microbiology: Recent Results (from the past 240 hour(s))  Culture, blood (routine x 2) Call MD if unable to obtain prior  to antibiotics being given     Status: None (Preliminary result)   Collection Time: 01/26/16  2:52 PM  Result Value Ref Range Status   Specimen Description BLOOD LEFT HAND  Final   Special Requests IN PEDIATRIC BOTTLE 1CC  Final   Culture   Final    NO GROWTH 4 DAYS Performed at [facility]    Report Status PENDING  Incomplete  Culture, blood (routine x 2) Call MD if unable to obtain prior to antibiotics being given     Status: None (Preliminary result)   Collection Time: 01/26/16  2:52 PM  Result Value Ref Range Status   Specimen Description BLOOD LEFT HAND  Final   Special Requests IN PEDIATRIC BOTTLE .5CC  Final   Culture   Final    NO GROWTH 4 DAYS Performed at [facility]    Report Status PENDING  Incomplete  Culture, Urine     Status: None   Collection Time: 01/26/16  3:25 PM  Result Value Ref Range Status   Specimen Description URINE, CATHETERIZED  Final   Special Requests NONE  Final   Culture   Final    >=100,000 COLONIES/mL PROTEUS MIRABILIS >=100,000 COLONIES/mL KLEBSIELLA PNEUMONIAE Performed at [facility]    Report Status 01/29/2016 FINAL  Final   Organism ID, Bacteria PROTEUS MIRABILIS  Final   Organism ID, Bacteria KLEBSIELLA PNEUMONIAE  Final      Susceptibility    Klebsiella pneumoniae - MIC*    AMPICILLIN 16 RESISTANT Resistant     CEFAZOLIN <=4 SENSITIVE Sensitive     CEFTRIAXONE <=1 SENSITIVE Sensitive     CIPROFLOXACIN <=0.25 SENSITIVE Sensitive     GENTAMICIN <=1 SENSITIVE Sensitive     IMIPENEM <=0.25 SENSITIVE Sensitive     NITROFURANTOIN 32 SENSITIVE Sensitive     TRIMETH/SULFA <=20 SENSITIVE Sensitive     AMPICILLIN/SULBACTAM 4 SENSITIVE Sensitive     PIP/TAZO <=4 SENSITIVE Sensitive     * >=100,000 COLONIES/mL KLEBSIELLA PNEUMONIAE   Proteus mirabilis - MIC*    AMPICILLIN <=2 SENSITIVE Sensitive     CEFAZOLIN <=4 SENSITIVE Sensitive     CEFTRIAXONE <=1 SENSITIVE Sensitive     CIPROFLOXACIN <=0.25 SENSITIVE Sensitive     GENTAMICIN <=1 SENSITIVE Sensitive     IMIPENEM 1 SENSITIVE Sensitive     NITROFURANTOIN 256 RESISTANT Resistant     TRIMETH/SULFA <=20 SENSITIVE Sensitive     AMPICILLIN/SULBACTAM <=2 SENSITIVE Sensitive     PIP/TAZO <=4 SENSITIVE Sensitive     * >=100,000 COLONIES/mL PROTEUS MIRABILIS  Culture, sputum-assessment     Status: None   Collection Time: 01/30/16 12:30 PM  Result Value Ref Range Status   Specimen Description SPUTUM  Final   Special Requests NONE  Final   Sputum evaluation   Final    THIS SPECIMEN IS ACCEPTABLE. RESPIRATORY CULTURE REPORT TO FOLLOW.   Report Status 01/30/2016 FINAL  Final  Culture, respiratory (NON-Expectorated)     Status: None (Preliminary result)   Collection Time: 01/30/16 12:30 PM  Result Value Ref Range Status   Specimen Description SPUTUM  Final   Special Requests NONE  Final   Gram Stain   Final    ABUNDANT WBC PRESENT,BOTH PMN AND MONONUCLEAR RARE SQUAMOUS EPITHELIAL CELLS PRESENT NO ORGANISMS SEEN THIS SPECIMEN IS ACCEPTABLE FOR SPUTUM CULTURE Performed at [facility]    Culture PENDING  Incomplete   Report Status PENDING  Incomplete     Labs: Basic Metabolic Panel:  Recent Labs  Lab 01/26/16 0910 01/27/16 0343 01/30/16 0937 01/31/16 0401  NA 130*  131*  --   --   K 3.7 3.6  --   --   CL 98* 103  --   --   CO2 22 21*  --   --   GLUCOSE 197* 162*  --   --   BUN 16 19  --   --   CREATININE 0.57 0.62 0.53  --   CALCIUM 8.8* 8.4*  --   --   MG  --   --   --  1.5*  PHOS  --   --   --  2.6   Liver Function Tests:  Recent Labs Lab 01/26/16 0910 01/27/16 0343  AST 11* 8*  ALT 10* 7*  ALKPHOS 79 76  BILITOT 0.8 0.6  PROT 6.0* 5.4*  ALBUMIN 3.0* 2.5*   No results for input(s): LIPASE, AMYLASE in the last 168 hours. No results for input(s): AMMONIA in the last 168 hours. CBC:  Recent Labs Lab 01/26/16 0910 01/26/16 1452 01/27/16 0343  WBC 33.1* 37.0* 31.7*  NEUTROABS 16.9* 21.0*  --   HGB 13.1 12.7 11.2*  HCT 39.0 38.6 32.6*  MCV 90.7 91.3 87.2  PLT 353 359 317   Cardiac Enzymes:  Recent Labs Lab 01/26/16 0910  TROPONINI <0.03   BNP: BNP (last 3 results)  Recent Labs  01/26/16 0910  BNP 140.0*    ProBNP (last 3 results) No results for input(s): PROBNP in the last 8760 hours.  CBG:  Recent Labs Lab 01/30/16 0756 01/30/16 1252 01/30/16 1808 01/30/16 2055 01/31/16 0802  GLUCAP 153* 155* 151* 128* 154*

## 2016-01-31 NOTE — Progress Notes (Signed)
CSW received consult for residential hospice.   CSW spoke with pt daughter, [name]. Pt daughter expressed that she is interested in [place] per her discussion with Dr. Benay Spice and Dr. Charlies Silvers. CSW discussed referral process and pt daughter expressed understanding.   CSW contacted [employer], Erling Conte and made referral. [facility] will process referral and notify CSW of availability.  CSW to continue to follow for residential hospice placement.  Alison Murray, MSW, [place] [facility] [phone]

## 2016-02-01 LAB — RESPIRATORY VIRUS PANEL
Adenovirus: NEGATIVE
INFLUENZA A: POSITIVE — AB
Influenza B: NEGATIVE
Metapneumovirus: NEGATIVE
PARAINFLUENZA 1 A: NEGATIVE
PARAINFLUENZA 2 A: NEGATIVE
Parainfluenza 3: NEGATIVE
RESPIRATORY SYNCYTIAL VIRUS B: NEGATIVE
Respiratory Syncytial Virus A: NEGATIVE
Rhinovirus: NEGATIVE

## 2016-02-01 LAB — CULTURE, RESPIRATORY W GRAM STAIN

## 2016-02-01 LAB — GLUCOSE, CAPILLARY
GLUCOSE-CAPILLARY: 134 mg/dL — AB (ref 65–99)
GLUCOSE-CAPILLARY: 119 mg/dL — AB (ref 65–99)

## 2016-02-01 LAB — CULTURE, RESPIRATORY: CULTURE: NO GROWTH

## 2016-02-01 MED ORDER — MORPHINE SULFATE (CONCENTRATE) 10 MG /0.5 ML PO SOLN: 5.0000 mg | ORAL | Status: AC | PRN

## 2016-02-01 MED ORDER — INSULIN ASPART 100 UNIT/ML ~~LOC~~ SOLN: 0.0000 [IU] | Freq: Three times a day (TID) | SUBCUTANEOUS | Status: AC

## 2016-02-01 MED ORDER — IPRATROPIUM-ALBUTEROL 0.5-2.5 (3) MG/3ML IN SOLN: 3.0000 mL | RESPIRATORY_TRACT | Status: AC | PRN

## 2016-02-01 NOTE — Progress Notes (Signed)
CSW received notification from [facility], Erling Conte that pt has bed available at [facility] today. Pt daughter aware and admission paperwork completed.   CSW discussed with pt daughter at bedside and clarified questions and concerns. CSW notified pt daughter that CSW will notify Ritta Slot that pt will not be returning. Pt daughter expressed appreciation. Pt daughter anxious for pt to get to [facility].   CSW facilitated pt discharge needs including contacting facility, faxing pt discharge information, discussed with pt daughter at bedside, providing RN phone number to call report, and arranging ambulance transport for pt to [facility] [place].   No further social work needs identified at this time.  CSW signing off.   Alison Murray, MSW, [place] [facility] [phone]

## 2016-02-01 NOTE — Progress Notes (Signed)
82 year old female with past medical history of recurrent UTIs, breast cancer with bone metastasis, history of CVA with residual right hemiparesis, diabetes mellitus on insulin, dyslipidemia, hypothyroidism, hypertension who presented from skilled nursing facility with multilobar PNA. GOC outlined to focus on comfort care, patient to go to hospice facility today  Patient seen and examined Discussed with daughter present in the room Patient appears comfortable BP 167/57 mmHg  Pulse 73  Temp(Src) 98.4 F (36.9 C) (Axillary)  Resp 20  Ht 5\' 4"  (1.626 m)  Wt 69.854 kg (154 lb)  BMI 26.42 kg/m2  SpO2 93% NAD Mild dyspnea evident S1 S2 Abdomen soft No edema Awake PLAN: To go to [facility] today, discussed with CSW, patient has a bed at hospice today.  DNR Continue comfort measures 20 minutes spent.

## 2016-02-01 NOTE — Progress Notes (Signed)
Tech told RN morning b/p- RN spoke with daughter about rechecking and maybe administering something to help bring blood pressure edown. Daughter relied that it was not necessary because her mother's pressures stay high and she rather it high than low. Daughter educated about pressure.

## 2016-02-01 NOTE — Progress Notes (Signed)
Chart briefly reviewed. Interviewed and examined patient in room with patient's daughter at bedside. Discussed at length with daughter Ms. Lynita Lombard who understands and is fully agreeable to discharge to be [facility] for full comfort care. Palliative care M.D. has also seen patient today. Updated discharge summary with new medications. Discussed with clinical Education officer, museum. DC to [facility].  Vernell Leep, MD, FACP, FHM. [facility] Pager [phone]  If 7PM-7AM, please contact night-coverage [email or web address] Password [facility] 02/01/2016, 12:04 PM

## 2016-02-01 NOTE — Progress Notes (Signed)
Nutrition Brief Note  Chart reviewed. Pt now transitioning to comfort care.  No further nutrition interventions warranted at this time.  Please re-consult as needed.   Kimbley Sprague, MS, RD, LDN Pager: [phone] After Hours Pager: [phone]    

## 2016-02-01 NOTE — Progress Notes (Signed)
RN and tech covered to turn patient to reduce pressure on her bottom but patient refused to be turned.

## 2016-02-01 NOTE — Discharge Summary (Signed)
Physician Discharge Summary  Molly [name] ZDG:[id number] DOB: 02-10-1923 DOA: 01/26/2016  PCP: Antony Blackbird, MD  Admit date: 01/26/2016 Discharge date: 02/01/2016  Recommendations for Outpatient Follow-up:  1. Patient being discharged to residential hospice at [facility].  Discharge Diagnoses:  Principal Problem:   Generalized weakness Active Problems:   Breast cancer metastasized to bone [facility])   Cerebrovascular accident (CVA) due to stenosis of left carotid artery (HCC)   Essential hypertension   HCAP (healthcare-associated pneumonia)   Leukocytosis   Left lower lobe pneumonia   UTI (lower urinary tract infection)   Controlled diabetes mellitus with circulatory complication, with long-term current use of insulin (HCC)   Hypothyroidism   Dyslipidemia associated with type 2 diabetes mellitus (HCC)   Pressure ulcer   Palliative care encounter   Goals of care, counseling/discussion   Acute respiratory failure ([place])   Dyspnea    Discharge Condition: stable   Diet recommendation: as tolerated   History of present illness:  82 year old female with past medical history of recurrent UTIs, breast cancer with bone metastasis, history of CVA with residual right hemiparesis, diabetes mellitus on insulin, dyslipidemia, hypothyroidism, hypertension who presented from skilled nursing facility where reports of generalized weakness and cough intermittently productive of clear sputum for past 2 weeks prior to this admission. Patient reports having taken Mucinex in skilled nursing facility but without significant symptomatic relief. No fevers.   In ED, patient was hemodynamically stable, afebrile. White blood cell count was 33.1, normal creatinine, normal troponin level. Chest x-ray showed left lower lobe consolidation consistent with pneumonia. She was also found to have UTI. Her urine culture is growing Proteus and Klebsiella. She was started on vanco and cefepime.  Dr. Charlies Silvers spoke  with the patient and her daughter at the bedside extensively yesterday morning about goals of care. Patient opted to change course of care to complete comfort care. She does not want any antibiotics at this point. Even Ventimask has become a burdensome to her and she wants as simplest possible regimen as it can be. Her daughter reported that she is exhausted watching her mother going through this and she is ready for full comfort care.  Hospital Course:    Assessment/Plan:    Principal Problem:  Generalized weakness - Likely in the setting of pneumonia and UTI in addition to chronic illness related to history of CVA and residual right-sided hemiparesis - Comfort care and plan to discharge to residential hospice  Active Problems:  Acute respiratory failure with hypoxia / HCAP (healthcare-associated pneumonia) / Left lower lobe pneumonia / Leukocytosis - Chest x-ray 3/19 showed worsening degree of consolidation along the left lower lung and lingular region  - Stopped all antibiotics per family request, per patient's request - We will continue cough medication, Robitussin as needed - Continue bronchodilators as needed - Influenza negative. Strep pneumonia negative. HIV is nonreactive - Blood cultures negative so far   UTI (lower urinary tract infection) due to Proteus mirabilis and Klebsiella pneumonia  - Urinalysis showed moderate leukocytes, positive nitrites and many bacteria - Urine culture is growing Proteus mirabilis and Klebsiella pneumonia species - No further antibiotics as patient and her daughter prefer comfort care and minimizing medication burden   Breast cancer metastasized to bone [facility]) - As noted above, stop pretty much all medications including Arimidex    Cerebrovascular accident (CVA) due to stenosis of left carotid artery (HCC) - Residual right sided hemiparesis - Stop the Plavix   Essential hypertension - Stop blood pressure medications.  Family aware  and prefers we minimize medication burden   Controlled diabetes mellitus with circulatory complication, with long-term current use of insulin (HCC) - Use sliding scale insulin on discharge   Hypothyroidism - Stop Synthroid, focus on comfort   Dyslipidemia associated with type 2 diabetes mellitus ([place]) - Stop zetia, focus on comfort   Sacral pressure ulcer - Wound care assessment done - Patient has deep tissue injury to coccyx. Measurement:0.5 cm x 1 cm maroon discoloration to wound bed surrounded by 2 cm blanchable erythema circumferentially - Dressing procedure/placement/frequency:Silicone border foam to sacrococcygeal area. Change every 3 days and PRN soilage.    Severe protein calorie malnutrition - In the setting of chronic illness - Diet as tolerated  DVT prophylaxis:  - SCDs bilaterally    Code Status: DNR/DNI Family Communication: Plan of care discussed with the patient and her daughter at the bedside   IV access:  Peripheral IV  Procedures and diagnostic studies:   Dg Chest 2 View 01/26/2016 Question COPD with LEFT lower lobe consolidation consistent with pneumonia.   Medical Consultants:  PCCM Palliative care   Other Consultants:  WOC PT Nutrition  IAnti-Infectives:   Vanco and cefepime 01/26/2016 --> 01/31/16 Azithromycin 01/29/2016 --> 01/31/16  Signed:  Vernell Leep, MD, FACP, FHM. [facility] Pager [phone]  If 7PM-7AM, please contact night-coverage [email or web address] Password [facility] 02/01/2016, 11:57 AM   Time spent in minutes: more than 30 minutes  Subjective Patient denies complaints. Appears slightly confused. No dyspnea reported. No active issues per RN or patient's daughter at bedside.  Discharge Exam:  Filed Vitals:   01/31/16 1300 01/31/16 2120 02/01/16 0500 02/01/16 0735  BP: 160/53 166/58 179/59 167/57  Pulse: 74 77 73   Temp: 97.5 F (36.4 C) 100.8 F (38.2 C) 98.4 F (36.9 C)   TempSrc: Axillary  Axillary Axillary   Resp: 20 20 20    Height:      Weight:      SpO2: 94% 99% 93%     General: Pt is alert, not in acute distress. Lying comfortably propped up in bed. Cardiovascular: Regular rate and rhythm, S1/S2 +, no murmurs Respiratory: Diminished on left, no wheezing Abdominal: Soft, non tender, non distended, bowel sounds +, no guarding. Foley catheter +. Extremities: Trace lower extremity pitting edema, no cyanosis, pulses palpable bilaterally DP and PT Neuro: Grossly nonfocal. Alert and oriented 2.  Discharge Instructions      Discharge Instructions    Call MD for:  difficulty breathing, headache or visual disturbances    Complete by:  As directed      Call MD for:  persistant dizziness or light-headedness    Complete by:  As directed      Call MD for:  persistant nausea and vomiting    Complete by:  As directed      Call MD for:  severe uncontrolled pain    Complete by:  As directed      Diet - low sodium heart healthy    Complete by:  As directed      Diet Carb Modified    Complete by:  As directed      Increase activity slowly    Complete by:  As directed             Medication List    STOP taking these medications        ACCU-CHEK SMARTVIEW test strip  Generic drug:  glucose blood     amLODipine 10 MG tablet  Commonly known as:  NORVASC     anastrozole 1 MG tablet  Commonly known as:  ARIMIDEX     atenolol 25 MG tablet  Commonly known as:  TENORMIN     clopidogrel 75 MG tablet  Commonly known as:  PLAVIX     cyanocobalamin 500 MCG tablet     ezetimibe 10 MG tablet  Commonly known as:  ZETIA     ferrous sulfate 325 (65 FE) MG tablet     irbesartan 300 MG tablet  Commonly known as:  AVAPRO     LEVEMIR FLEXTOUCH 100 UNIT/ML Pen  Generic drug:  Insulin Detemir     levothyroxine 75 MCG tablet  Commonly known as:  SYNTHROID, LEVOTHROID     polyethylene glycol packet  Commonly known as:  MIRALAX / GLYCOLAX     simethicone 40 MG/0.6ML  drops  Commonly known as:  MYLICON      TAKE these medications        alum & mag hydroxide-simeth 200-200-20 MG/5ML suspension  Commonly known as:  MAALOX/MYLANTA  Take 30 mLs by mouth every 6 (six) hours as needed for indigestion or heartburn (dyspepsia).     antiseptic oral rinse 0.05 % Liqd solution  Commonly known as:  CPC / CETYLPYRIDINIUM CHLORIDE 0.05%  7 mLs by Mouth Rinse route 2 times daily at 12 noon and 4 pm.     chlorhexidine 0.12 % solution  Commonly known as:  PERIDEX  15 mLs by Mouth Rinse route 2 (two) times daily.     feeding supplement (GLUCERNA SHAKE) Liqd  Take 237 mLs by mouth 3 (three) times daily between meals.     guaiFENesin-dextromethorphan 100-10 MG/5ML syrup  Commonly known as:  ROBITUSSIN DM  Take 15 mLs by mouth every 4 (four) hours as needed for cough.     insulin aspart 100 UNIT/ML injection  Commonly known as:  novoLOG  Inject 0-9 Units into the skin 3 (three) times daily with meals. CBG < 70: implement hypoglycemia protocol  CBG 70 - 120: 0 units  CBG 121 - 150: 1 unit  CBG 151 - 200: 2 units  CBG 201 - 250: 3 units  CBG 251 - 300: 5 units  CBG 301 - 350: 7 units  CBG 351 - 400: 9 units  CBG > 400: call MD     ipratropium-albuterol 0.5-2.5 (3) MG/3ML Soln  Commonly known as:  DUONEB  Take 3 mLs by nebulization every 4 (four) hours as needed (Dyspnea or wheezing).     morphine CONCENTRATE 10 mg / 0.5 ml concentrated solution  Take 0.25 mLs (5 mg total) by mouth every 4 (four) hours as needed for moderate pain, severe pain, anxiety or shortness of breath.       Follow-up Information    Follow up with FULP, CAMMIE, MD. Schedule an appointment as soon as possible for a visit in 1 week.   Specialty:  Family Medicine   Why:  Follow up appt after recent hospitalization   Contact information:   [address] [place] [address] [phone]        The results of significant diagnostics from this hospitalization (including imaging,  microbiology, ancillary and laboratory) are listed below for reference.    Significant Diagnostic Studies: Dg Chest 2 View  01/26/2016  CLINICAL DATA:  Increased cough and weakness over past few days, history hypertension, type II diabetes mellitus, breast cancer, stroke EXAM: CHEST  2 VIEW COMPARISON:  08/29/2015 FINDINGS: Borderline enlargement of cardiac silhouette. Atherosclerotic calcification  aorta. Mediastinal contours and pulmonary vascularity normal. LEFT lower lobe consolidation consistent with pneumonia. Lungs otherwise clear though mildly hyperinflated. No pleural effusion or pneumothorax. IMPRESSION: Question COPD with LEFT lower lobe consolidation consistent with pneumonia. Electronically Signed   By: Lavonia Dana M.D.   On: 01/26/2016 10:00   Ct Chest Wo Contrast  01/30/2016  CLINICAL DATA:  Metastatic breast cancer. Inpatient. Acute respiratory failure on antibiotic therapy for pneumonia. Persistent cough. Leukocytosis. Worsening left lung opacities on chest radiograph . EXAM: CT CHEST WITHOUT CONTRAST TECHNIQUE: Multidetector CT imaging of the chest was performed following the standard protocol without IV contrast. COMPARISON:  Chest radiograph from 1 day prior. No prior chest CT. 07/22/2015 CT abdomen. FINDINGS: Mediastinum/Nodes: Top-normal heart size . Mild pericardial fluid/thickening. Extensive coronary atherosclerosis. Great vessels are normal in course and caliber. There is a large hypodense 5.4 x 3.5 cm right thyroid lobe nodule (series 2/image 4), which displaces the trachea to the left and mildly narrows the tracheal lumen at the level of the thyroid. Normal esophagus. No right axillary lymphadenopathy. There are multiple mildly enlarged left axillary, left subpectoral and left retro scapular lymph nodes, largest a 1.0 cm left retropectoral node (series 2/image 4), a 1.0 cm left axillary node (series 2/ image 12) and a 1.0 cm left retroscapular node (series 2/image 14) . Multiple  mildly enlarged bilateral paratracheal nodes, largest a 1.3 cm right lower paratracheal node (series 2/ image 19). Mildly-to-moderately enlarged 1.8 cm subcarinal node (series 2/ image 24). The hila are poorly visualized on this noncontrast study. Lungs/Pleura: No pneumothorax. Trace layering right pleural effusion. Small to moderate left pleural effusion, which accumulates predominantly within the left major fissure and posterior apical left pleural space, which could indicate loculation. Mild dependent right lower lobe atelectasis. Near complete consolidation with air bronchograms in the lingula with lesser patchy consolidation and ground-glass opacity in the more superior left upper lobe. Complete consolidation with air bronchograms and some volume loss in the left lower lobe. Upper abdomen: Peripherally calcified 1.4 cm hypodense structure near the porta hepatis may represent an aneurysm, possibly from the hepatic artery, unchanged since 07/22/2015 CT. Musculoskeletal: No aggressive appearing focal osseous lesions. Moderate degenerative changes in the thoracic spine. 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 IMPRESSION: 1. Complete left lower lobe consolidation with air bronchograms and some volume loss. Near complete lingular consolidation with air bronchograms. Patchy consolidation and ground-glass opacity in the more superior left upper lobe. Findings are most consistent with a multilobar pneumonia, with an underlying pulmonary nodule or lung mass not excluded. 2. Small to moderate left pleural effusion accumulating predominantly within the left major fissure and posterior apical left pleural space, which could indicate loculation. Trace layering right pleural effusion. 3. Large 5.4 cm right thyroid lobe nodule, which displaces the trachea to the left and mildly narrows the tracheal lumen. 4. Nonspecific left axillary, left retroscapular, left subpectoral and mediastinal  lymphadenopathy. 5. Mild pericardial fluid/thickening. 6. Extensive coronary atherosclerosis. 7. 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 Electronically Signed   By: Ilona Sorrel M.D.   On: 01/30/2016 14:06   Dg Chest Port 1 View  01/29/2016  CLINICAL DATA:  Shortness of breath and chest pain. History of breast carcinoma EXAM: PORTABLE CHEST 1 VIEW COMPARISON:  January 26, 2016 FINDINGS: There is significant increase in consolidation throughout the left lower lobe and portions of the lingula. There is a small left effusion. The right lung is  clear. Heart is upper normal in size with pulmonary vascularity within normal limits. There is atherosclerotic calcification in the aorta. No bone lesions evident. IMPRESSION: Extensive airspace consolidation throughout the left lower lobe and portions of the lingula. This finding represents a significant change from 2 days prior. There is a small left effusion. Right lung clear. No change in cardiac silhouette. Electronically Signed   By: Lowella Grip III M.D.   On: 01/29/2016 07:54    Microbiology: Recent Results (from the past 240 hour(s))  Culture, blood (routine x 2) Call MD if unable to obtain prior to antibiotics being given     Status: None   Collection Time: 01/26/16  2:52 PM  Result Value Ref Range Status   Specimen Description BLOOD LEFT HAND  Final   Special Requests IN PEDIATRIC BOTTLE [place]  Final   Culture   Final    NO GROWTH 5 DAYS Performed at [facility]    Report Status 01/31/2016 FINAL  Final  Culture, blood (routine x 2) Call MD if unable to obtain prior to antibiotics being given     Status: None   Collection Time: 01/26/16  2:52 PM  Result Value Ref Range Status   Specimen Description BLOOD LEFT HAND  Final   Special Requests IN PEDIATRIC BOTTLE .5CC  Final   Culture   Final    NO GROWTH 5 DAYS Performed at [facility]    Report  Status 01/31/2016 FINAL  Final  Culture, Urine     Status: None   Collection Time: 01/26/16  3:25 PM  Result Value Ref Range Status   Specimen Description URINE, CATHETERIZED  Final   Special Requests NONE  Final   Culture   Final    >=100,000 COLONIES/mL PROTEUS MIRABILIS >=100,000 COLONIES/mL KLEBSIELLA PNEUMONIAE Performed at [facility]    Report Status 01/29/2016 FINAL  Final   Organism ID, Bacteria PROTEUS MIRABILIS  Final   Organism ID, Bacteria KLEBSIELLA PNEUMONIAE  Final      Susceptibility   Klebsiella pneumoniae - MIC*    AMPICILLIN 16 RESISTANT Resistant     CEFAZOLIN <=4 SENSITIVE Sensitive     CEFTRIAXONE <=1 SENSITIVE Sensitive     CIPROFLOXACIN <=0.25 SENSITIVE Sensitive     GENTAMICIN <=1 SENSITIVE Sensitive     IMIPENEM <=0.25 SENSITIVE Sensitive     NITROFURANTOIN 32 SENSITIVE Sensitive     TRIMETH/SULFA <=20 SENSITIVE Sensitive     AMPICILLIN/SULBACTAM 4 SENSITIVE Sensitive     PIP/TAZO <=4 SENSITIVE Sensitive     * >=100,000 COLONIES/mL KLEBSIELLA PNEUMONIAE   Proteus mirabilis - MIC*    AMPICILLIN <=2 SENSITIVE Sensitive     CEFAZOLIN <=4 SENSITIVE Sensitive     CEFTRIAXONE <=1 SENSITIVE Sensitive     CIPROFLOXACIN <=0.25 SENSITIVE Sensitive     GENTAMICIN <=1 SENSITIVE Sensitive     IMIPENEM 1 SENSITIVE Sensitive     NITROFURANTOIN 256 RESISTANT Resistant     TRIMETH/SULFA <=20 SENSITIVE Sensitive     AMPICILLIN/SULBACTAM <=2 SENSITIVE Sensitive     PIP/TAZO <=4 SENSITIVE Sensitive     * >=100,000 COLONIES/mL PROTEUS MIRABILIS  Respiratory virus antigens panel     Status: Abnormal   Collection Time: 01/26/16  3:38 PM  Result Value Ref Range Status   Respiratory Syncytial Virus A Negative Negative Final   Respiratory Syncytial Virus B Negative Negative Final   Influenza A Positive (A) Negative Final    Comment: Unable to subtype.   Influenza B Negative Negative  Final   Parainfluenza 1 Negative Negative Final   Parainfluenza 2 Negative  Negative Final   Parainfluenza 3 Negative Negative Final   Metapneumovirus Negative Negative Final   Rhinovirus Negative Negative Final   Adenovirus Negative Negative Final    Comment: (NOTE) Performed At: [facility] [address] [place], [place] [id number] Lindon Romp MD ON:[id number]   Culture, sputum-assessment     Status: None   Collection Time: 01/30/16 12:30 PM  Result Value Ref Range Status   Specimen Description SPUTUM  Final   Special Requests NONE  Final   Sputum evaluation   Final    THIS SPECIMEN IS ACCEPTABLE. RESPIRATORY CULTURE REPORT TO FOLLOW.   Report Status 01/30/2016 FINAL  Final  Culture, respiratory (NON-Expectorated)     Status: None   Collection Time: 01/30/16 12:30 PM  Result Value Ref Range Status   Specimen Description SPUTUM  Final   Special Requests NONE  Final   Gram Stain   Final    ABUNDANT WBC PRESENT,BOTH PMN AND MONONUCLEAR RARE SQUAMOUS EPITHELIAL CELLS PRESENT NO ORGANISMS SEEN THIS SPECIMEN IS ACCEPTABLE FOR SPUTUM CULTURE Performed at [facility]    Culture   Final    NO GROWTH 2 DAYS Performed at [facility]    Report Status 02/01/2016 FINAL  Final     Labs: Basic Metabolic Panel:  Recent Labs Lab 01/26/16 0910 01/27/16 0343 01/30/16 0937 01/31/16 0401  NA 130* 131*  --   --   K 3.7 3.6  --   --   CL 98* 103  --   --   CO2 22 21*  --   --   GLUCOSE 197* 162*  --   --   BUN 16 19  --   --   CREATININE 0.57 0.62 0.53  --   CALCIUM 8.8* 8.4*  --   --   MG  --   --   --  1.5*  PHOS  --   --   --  2.6   Liver Function Tests:  Recent Labs Lab 01/26/16 0910 01/27/16 0343  AST 11* 8*  ALT 10* 7*  ALKPHOS 79 76  BILITOT 0.8 0.6  PROT 6.0* 5.4*  ALBUMIN 3.0* 2.5*   No results for input(s): LIPASE, AMYLASE in the last 168 hours. No results for input(s): AMMONIA in the last 168 hours. CBC:  Recent Labs Lab 01/26/16 0910 01/26/16 1452 01/27/16 0343  WBC 33.1* 37.0* 31.7*   NEUTROABS 16.9* 21.0*  --   HGB 13.1 12.7 11.2*  HCT 39.0 38.6 32.6*  MCV 90.7 91.3 87.2  PLT 353 359 317   Cardiac Enzymes:  Recent Labs Lab 01/26/16 0910  TROPONINI <0.03   BNP: BNP (last 3 results)  Recent Labs  01/26/16 0910  BNP 140.0*    ProBNP (last 3 results) No results for input(s): PROBNP in the last 8760 hours.  CBG:  Recent Labs Lab 01/31/16 0802 01/31/16 1201 01/31/16 1655 01/31/16 2126 02/01/16 0758  GLUCAP 154* 123* 128* 154* 134*   Patient was seen by Dr. Rowe Pavy, Palliative Care Team who discussed with patient's daughter in the room. I personally met with patient's daughter [name] at bedside. Updated care and answered questions. She feels quite comfortable with current plans of discharging patient to [facility] for full comfort care.  Vernell Leep, MD, FACP, FHM. [facility] Pager [phone]  If 7PM-7AM, please contact night-coverage [email or web address] Password [facility] 02/01/2016, 12:02 PM

## 2016-02-22 ENCOUNTER — Telehealth: Payer: Self-pay | Admitting: Oncology

## 2016-02-22 NOTE — Telephone Encounter (Signed)
Certificate of death received in HIM. Patient status changed to deceased.

## 2016-03-12 DEATH — deceased

## 2017-02-15 IMAGING — MR MR MRA HEAD W/O CM
10 of 11 series · 31 of 48 positions shown · non-contrast
Comparison: Head CT at 2229 hours today. Brain MRI and head and
neck MRA 07/30/2015

CLINICAL DATA: 89-year-old female with recent lacunar infarct in
the left hemisphere earlier this month status post tPA. Recurrent
right side weakness and facial droop. Initial encounter.

EXAM:
MRI HEAD WITHOUT CONTRAST
MRA HEAD WITHOUT CONTRAST
TECHNIQUE: Multiplanar, multiecho pulse sequences of the brain and surrounding
structures were obtained without intravenous contrast. Angiographic
images of the head were obtained using MRA technique without
contrast.

[Series 3: DWI · axial · 3.0mm · 1.09mm/px · z∈[-121,+11]mm · 7 of 90 slices shown (1 of 4)]
[im 1/90]
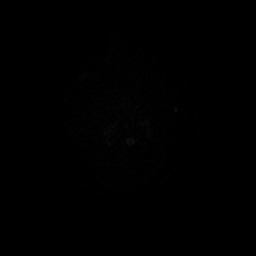
[im 15/90]
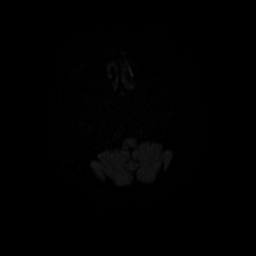
[im 30/90]
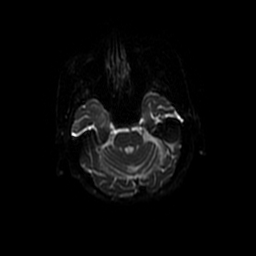
[im 45/90]
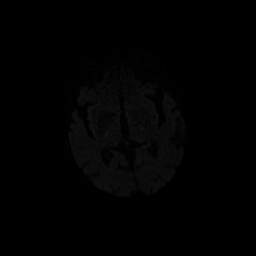
[im 60/90]
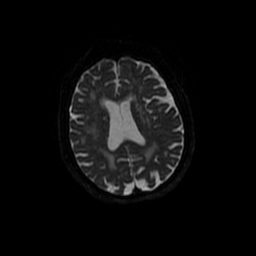
[im 75/90]
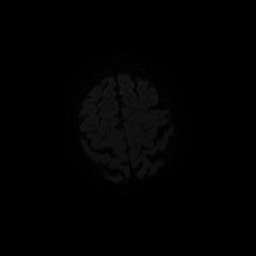
[im 90/90]
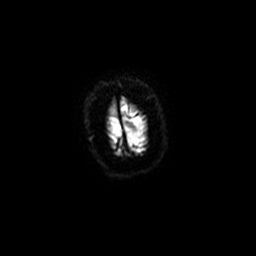

[Series 4: T1 · sagittal · 5.0mm · 0.47mm/px · 2 of 23 slices shown]
[im 1/23]
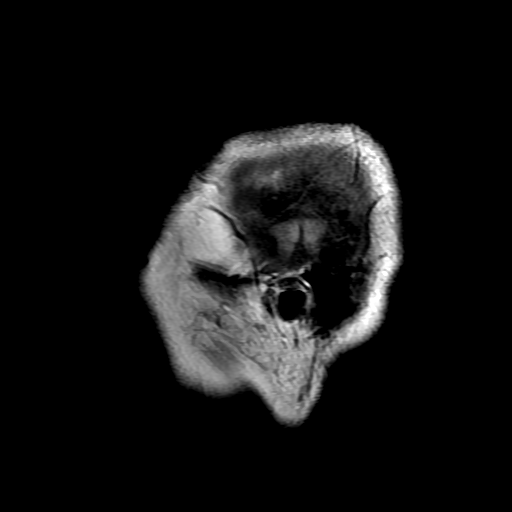
[im 23/23]
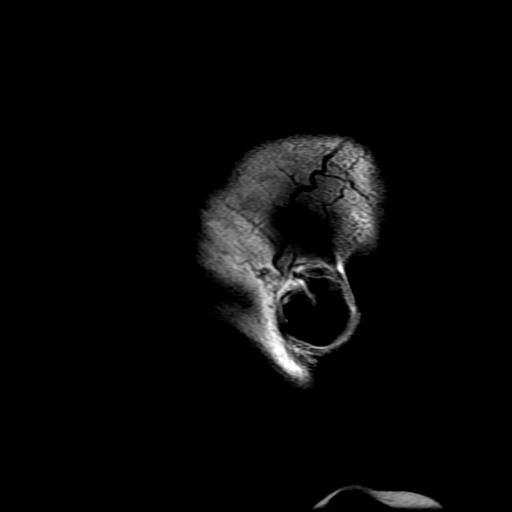

[Series 6: DWI · coronal · 5.0mm · 1.09mm/px · 5 of 64 slices shown (2 of 4)]
[im 1/64]
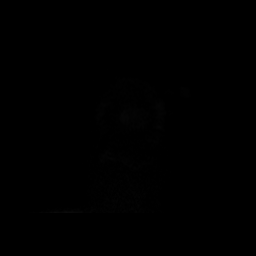
[im 16/64]
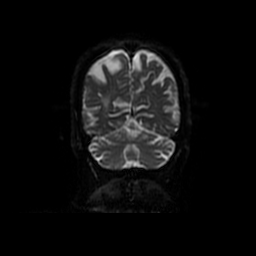
[im 32/64]
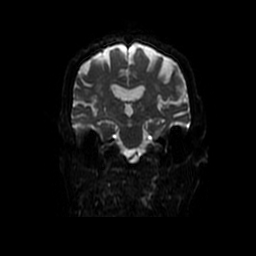
[im 48/64]
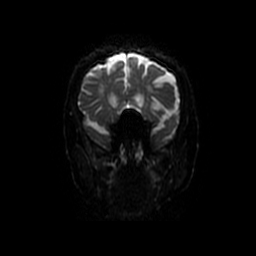
[im 64/64]
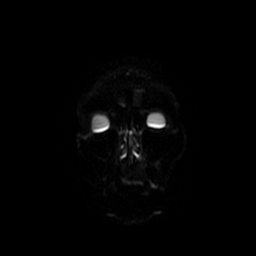

[Series 7: FLAIR · axial · 5.0mm · 0.43mm/px · z∈[-122,+10]mm · 2 of 23 slices shown]
[im 1/23]
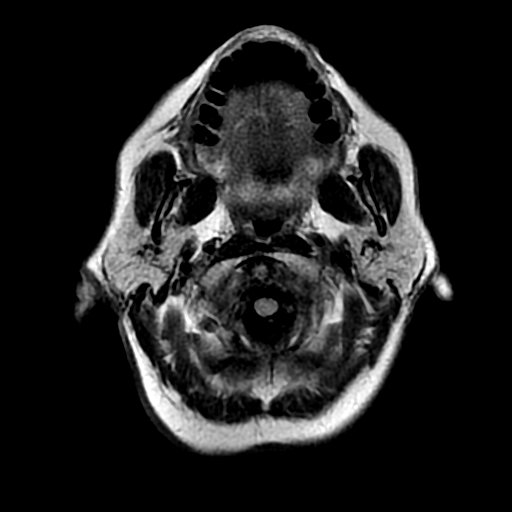
[im 23/23]
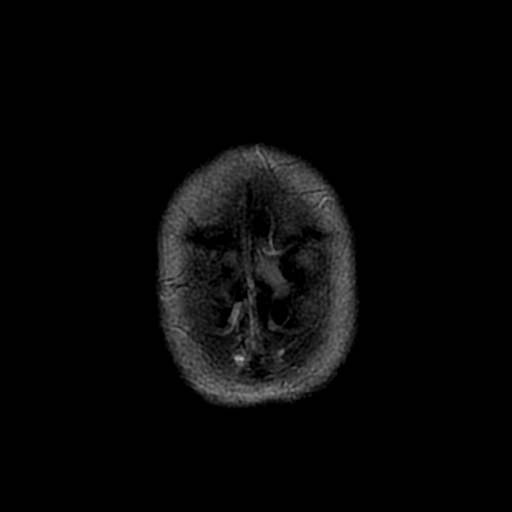

[Series 8: T2 · axial · 5.0mm · 0.43mm/px · z∈[-122,+10]mm · 2 of 23 slices shown (1 of 2)]
[im 1/23]
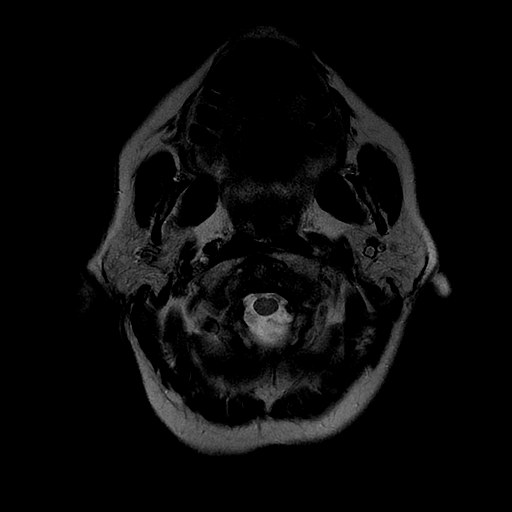
[im 23/23]
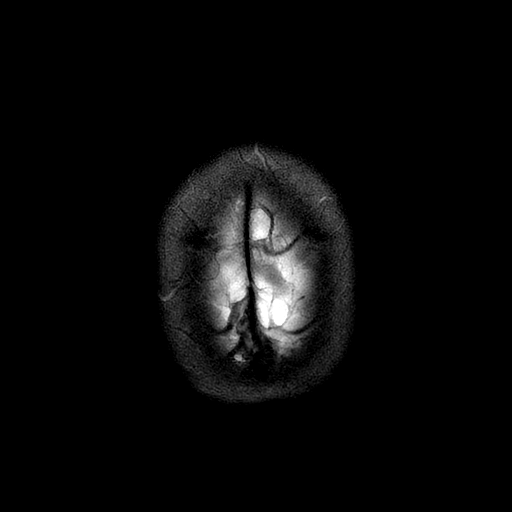

[Series 9: ax mpgr · axial · 5.0mm · 0.43mm/px · z∈[-122,+10]mm · 2 of 23 slices shown]
[im 1/23]
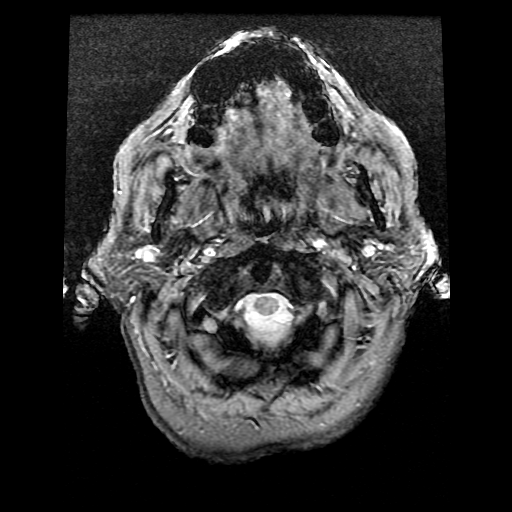
[im 23/23]
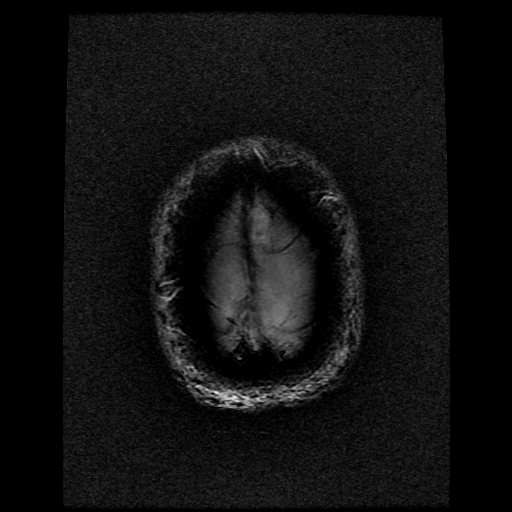

[Series 10: T2 · coronal · 5.0mm · 0.43mm/px · 2 of 26 slices shown (2 of 2)]
[im 1/26]
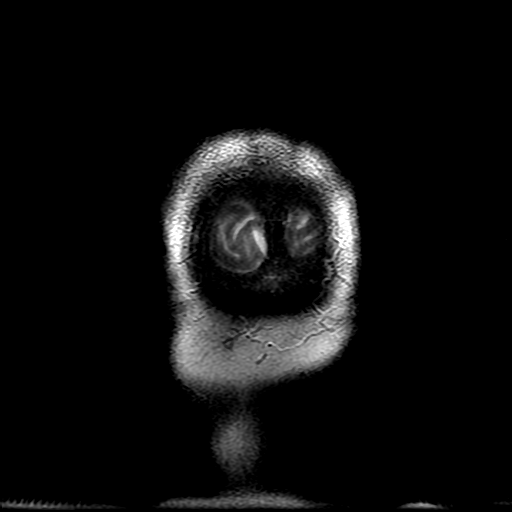
[im 26/26]
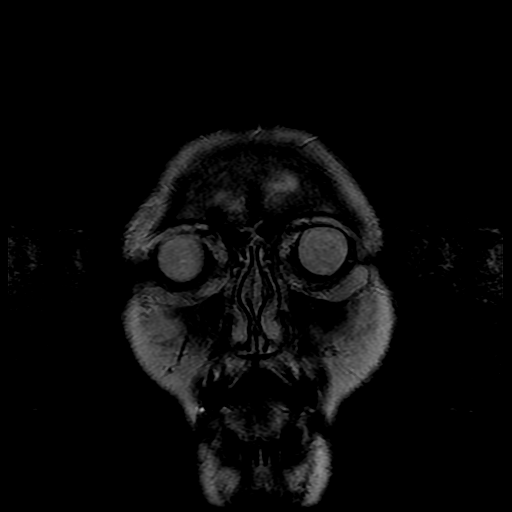

[Series 11: (id) mt fs · axial · 1.4mm · 0.43mm/px · z∈[-127,-108]mm · 2 of 152 slices shown]
[im 1/152]
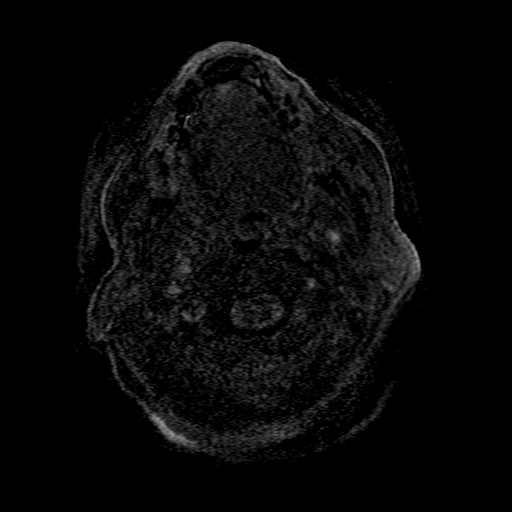
[im 28/152]
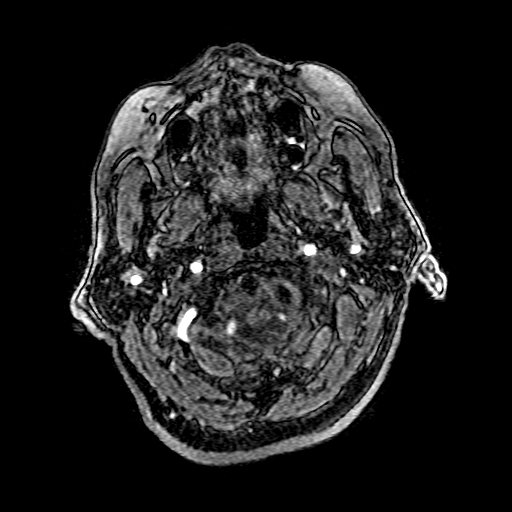

[Series 300: DWI · axial · 3.0mm · 1.09mm/px · z∈[-121,+11]mm · 4 of 45 slices shown (3 of 4)]
[im 1/45]
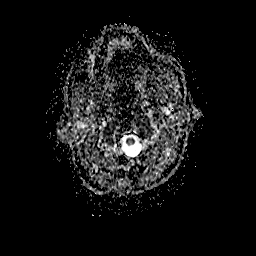
[im 15/45]
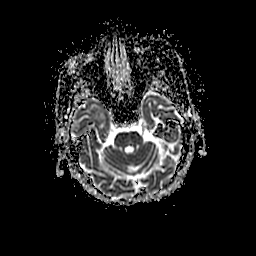
[im 30/45]
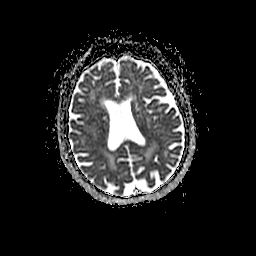
[im 45/45]
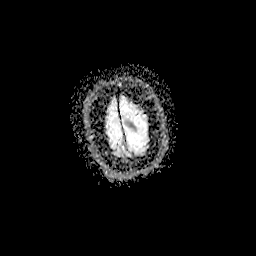

[Series 600: DWI · coronal · 5.0mm · 1.09mm/px · 3 of 32 slices shown (4 of 4)]
[im 1/32]
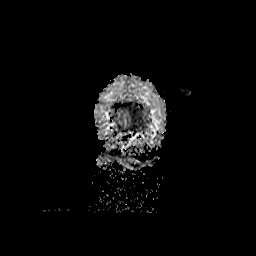
[im 16/32]
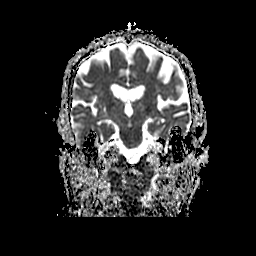
[im 32/32]
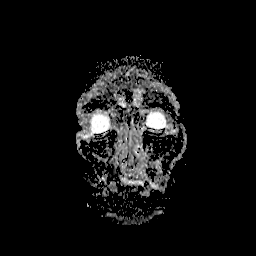

[31 of 48 positions shown; findings below may reference images not displayed]

FINDINGS: MRI HEAD FINDINGS

Linear restricted diffusion extending from the posterior left corona
radiata inferiorly toward the putamen and external capsule has faded
since 07/30/2015. There is stable curvilinear restricted diffusion
in the more anterior corona radiata (series 6, image 18). No new
areas of diffusion restriction. Major intracranial vascular flow
voids are stable.

No interval hemorrhage. No intracranial mass effect. No new
intracranial signal abnormality. No mass effect, evidence of mass
lesion, ventriculomegaly, or extra-axial collection.
Cervicomedullary junction and pituitary are within normal limits.

Stable paranasal sinuses, mastoids, visualized internal auditory
structures, orbits soft tissues, and scalp soft tissues. Negative
visualized cervical spine. Bone marrow signal remains normal.

MRA HEAD FINDINGS

Stable posterior circulation, high-grade stenosis of the left
vertebral artery V4 segment and asymmetrically decreased flow signal
at the left PICA origin. Up to moderate proximal basilar artery
stenosis. The basilar artery remains patent. SCA and PCAs are
stable, mild to moderate left P1 segment stenosis. Posterior
communicating arteries are diminutive or absent.

Stable antegrade flow signal in the distal cervical ICAs. Stable ICA
siphons with high-grade bilateral supraclinoid segment irregularity.
Both ophthalmic artery origins remain patent. Stable carotid
termini. MCA and ACA origins remain patent.

Stable visualized bilateral ACA branches. Stable visualized right
MCA branches with mild mid M1 stenosis. Left MCA M1 segment and
bifurcation irregularity and up to mild stenosis appears stable.
Visualized left MCA branches are stable.
IMPRESSION: 1. Expected evolution of the recent left MCA white matter infarct.
No interval hemorrhage. No associated mass effect.
2. No new intracranial abnormality.
3. Stable severe intracranial atherosclerosis on MRA. Moderate to
high-grade stenoses of the distal left vertebral artery, both distal
ICA siphons, and the proximal basilar artery.

## 2017-02-19 IMAGING — CT CT HEAD W/O CM
2 series · 16 of 30 positions shown, 20 images · non-contrast
Comparison: Most recent MR 08/12/2015. Most recent CT head
08/12/2015.

CLINICAL DATA: Follow up stroke.  RIGHT-sided weakness.

EXAM:
CT HEAD WITHOUT CONTRAST
TECHNIQUE: Contiguous axial images were obtained from the base of the skull
through the vertex without intravenous contrast.

[Series 201: head w/o, idose (1) · axial · non-contrast · 0.38mm/px · z∈[+19,+149]mm · 13 of 32 slices shown, 17 images]
[im 3/32  brain]
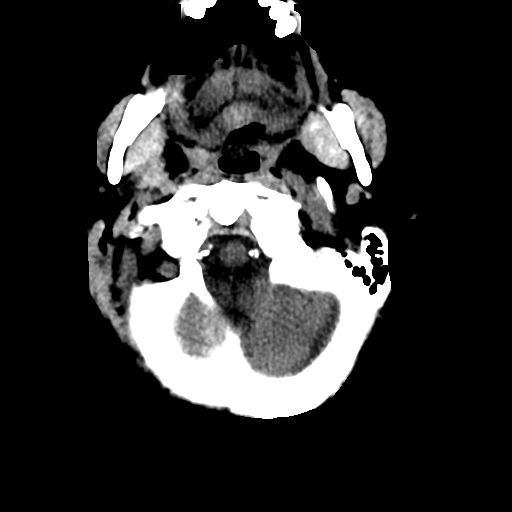
[im 3/32  bone]
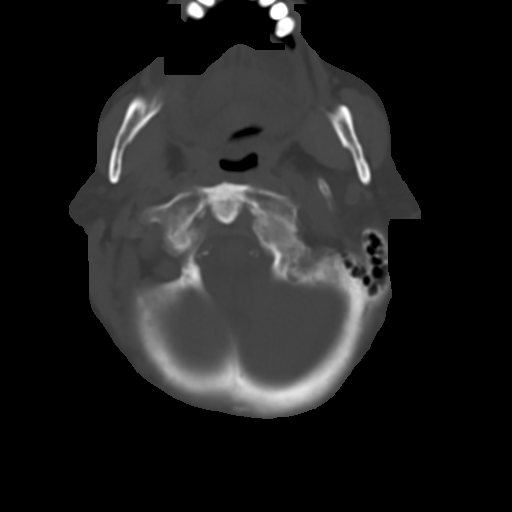
[im 5/32  brain]
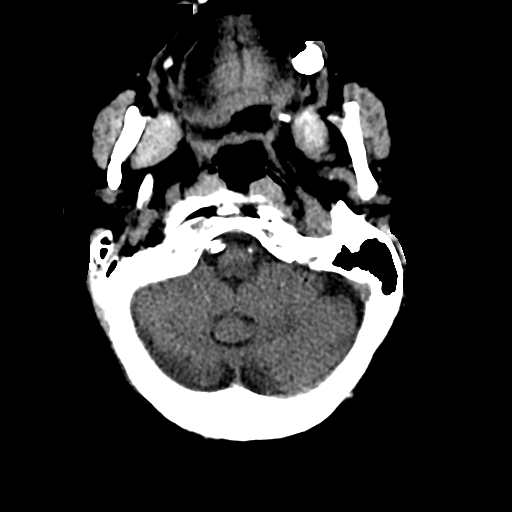
[im 7/32  brain]
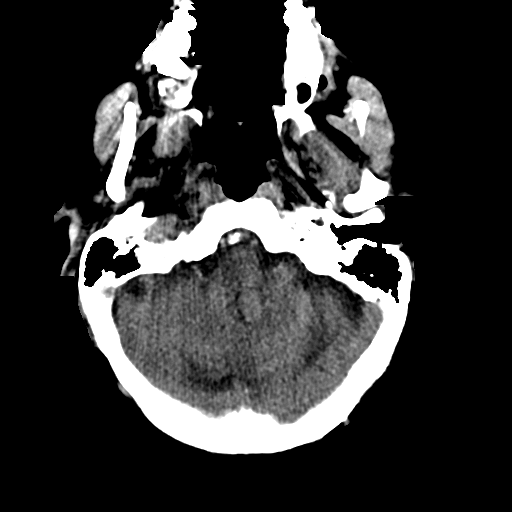
[im 9/32  brain]
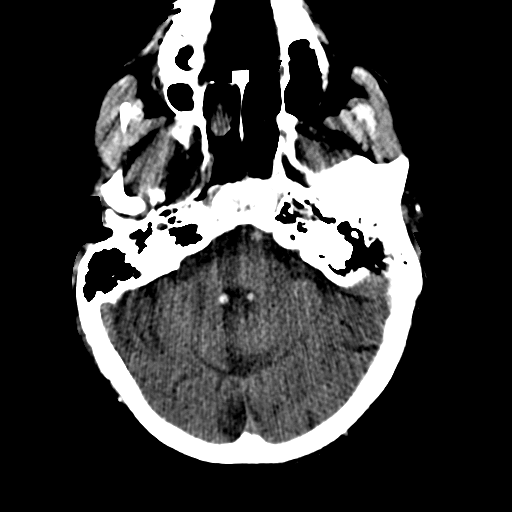
[im 12/32  brain]
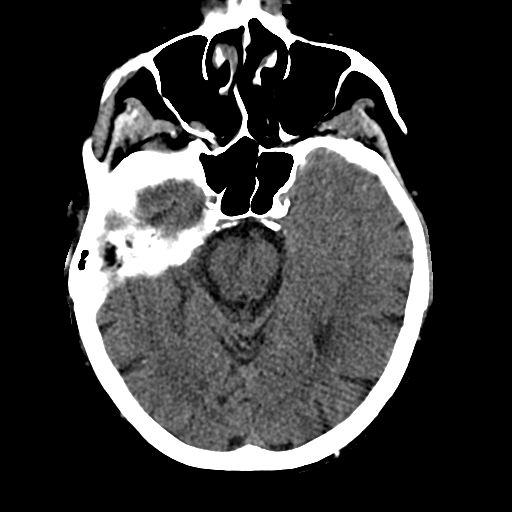
[im 12/32  bone]
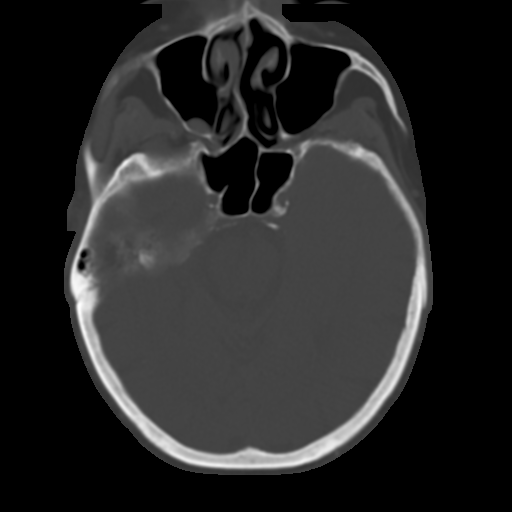
[im 14/32  brain]
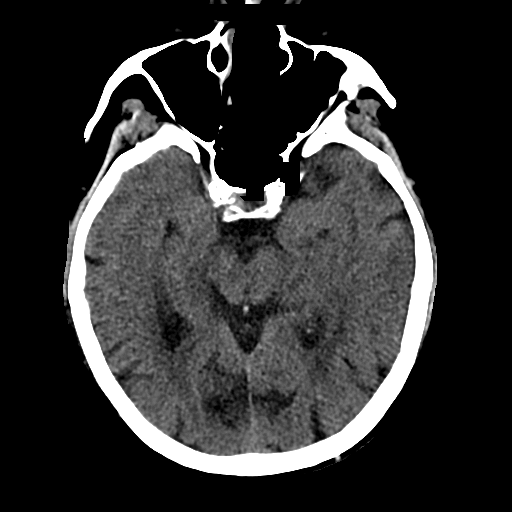
[im 16/32  brain]
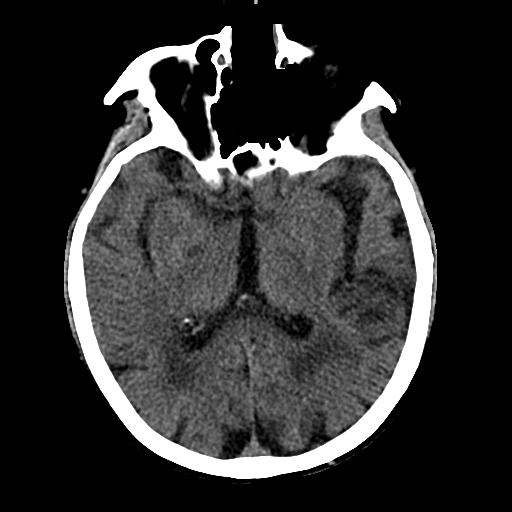
[im 18/32  brain]
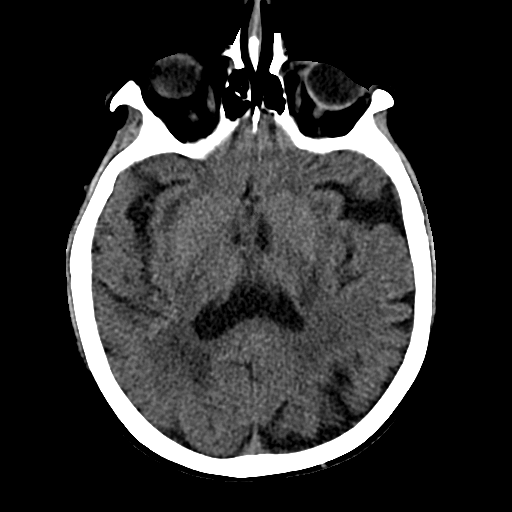
[im 20/32  brain]
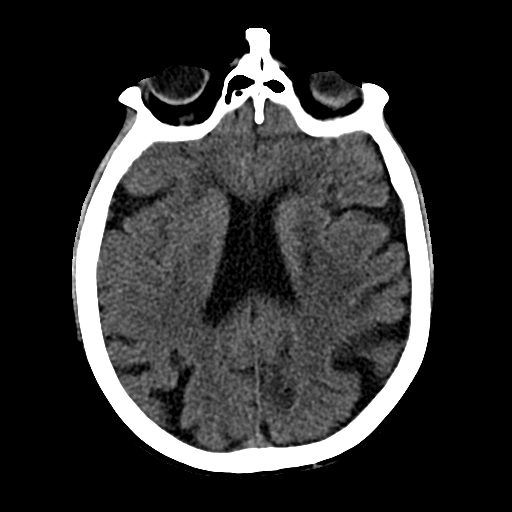
[im 20/32  bone]
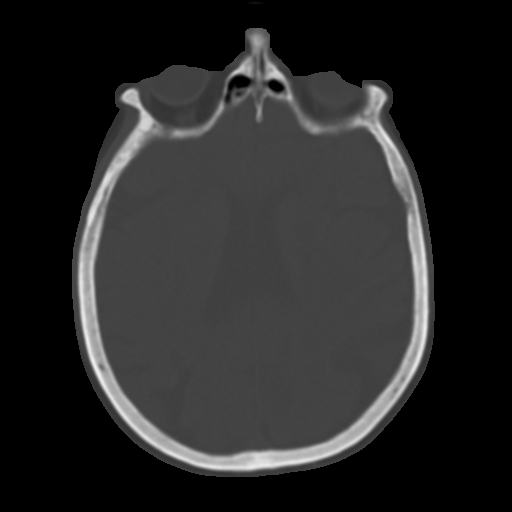
[im 23/32  brain]
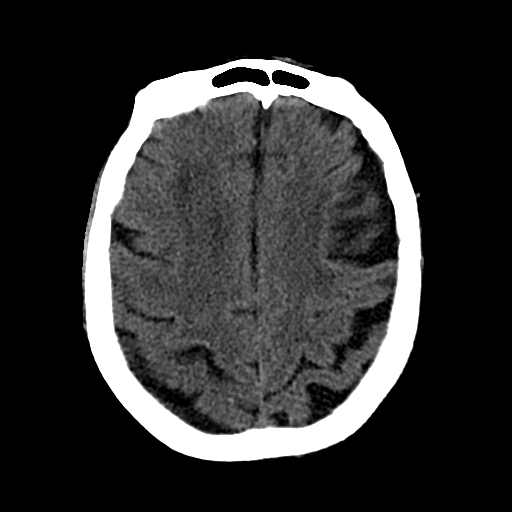
[im 25/32  brain]
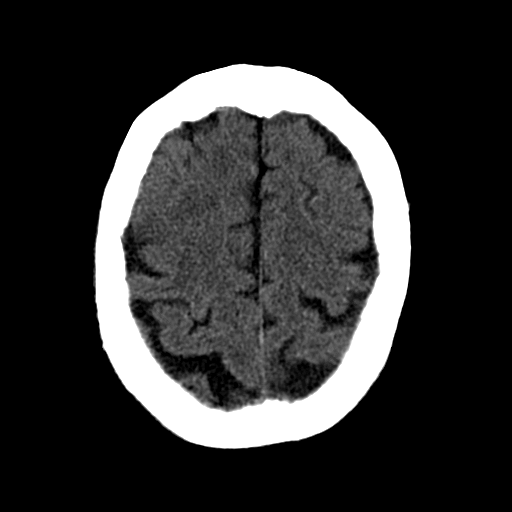
[im 27/32  brain]
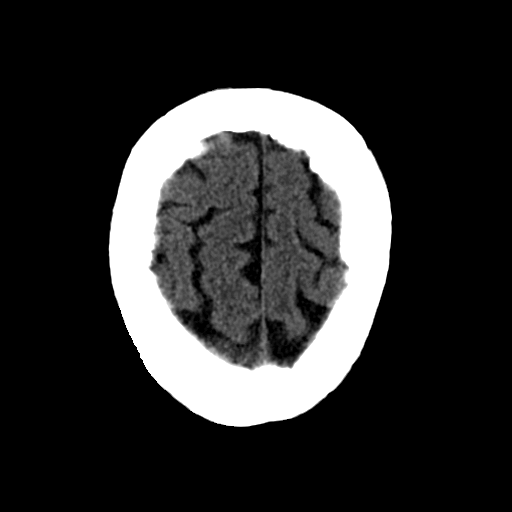
[im 29/32  brain]
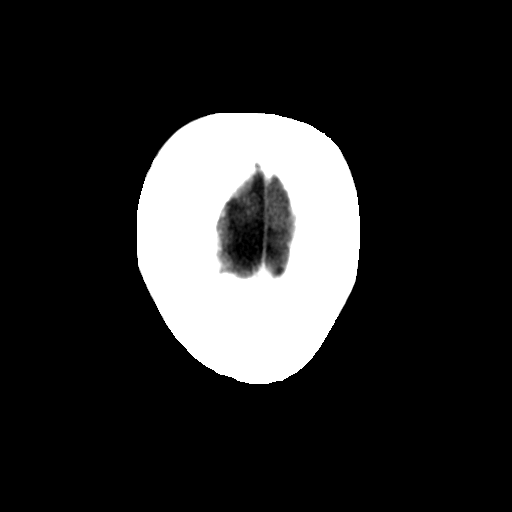
[im 29/32  bone]
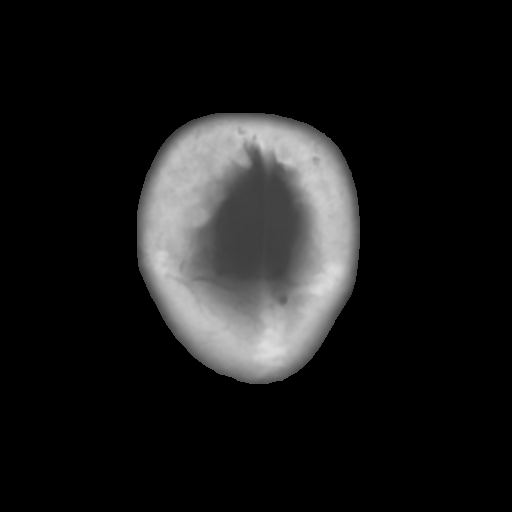

[Series 202: head w/o bone, idose (1) · axial · non-contrast · 0.38mm/px · z∈[+19,+64]mm · 3 of 32 slices shown]
[im 3/32  bone]
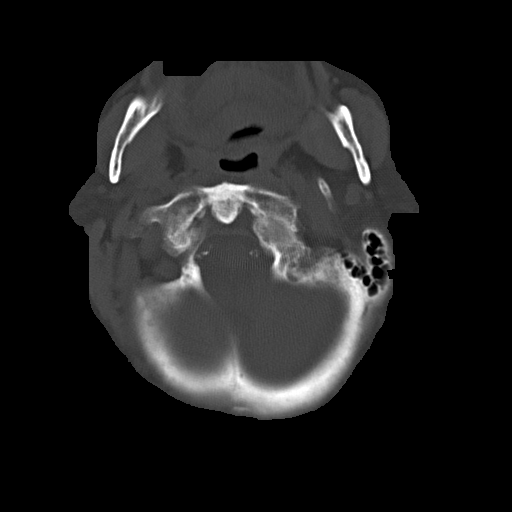
[im 7/32  bone]
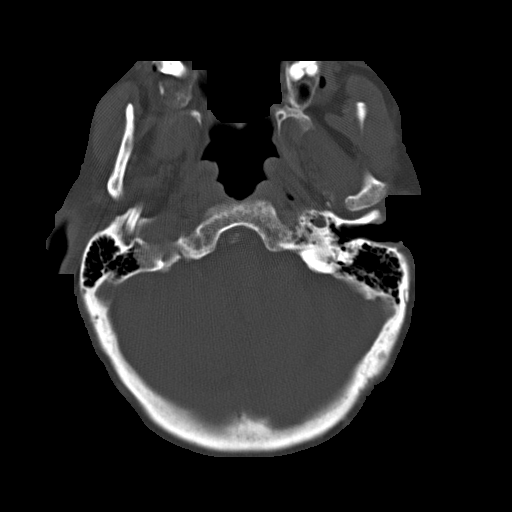
[im 12/32  bone]
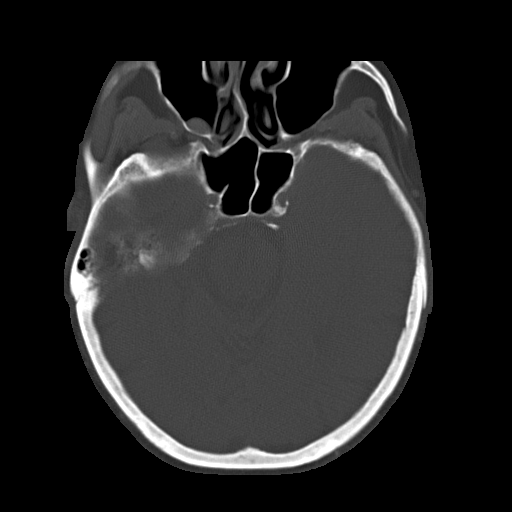

[16 of 30 positions shown; findings below may reference images not displayed]

FINDINGS: Increasingly well defined hypoattenuation in the LEFT centrum
semiovale representing the area of restricted diffusion on the
recent MR. No definite new areas of acute infarction. No hemorrhagic
transformation. Baseline atrophy with small vessel disease.
Calvarium intact. No sinus or acute mastoid disease. Vascular
calcification.
IMPRESSION: Increasingly well-defined hypoattenuation representing acute
infarction LEFT MCA territory. No hemorrhagic transformation.
